# Patient Record
Sex: Male | Born: 1950 | Race: White | Hispanic: No | Marital: Single | Smoking: Former smoker
Health system: Southern US, Community
[De-identification: ages and names within clinical notes are randomized; demographics above are authoritative.]

## PROBLEM LIST (undated history)

## (undated) DIAGNOSIS — K429 Umbilical hernia without obstruction or gangrene: Secondary | ICD-10-CM

## (undated) DIAGNOSIS — K760 Fatty (change of) liver, not elsewhere classified: Secondary | ICD-10-CM

## (undated) DIAGNOSIS — G4730 Sleep apnea, unspecified: Secondary | ICD-10-CM

## (undated) DIAGNOSIS — E785 Hyperlipidemia, unspecified: Secondary | ICD-10-CM

## (undated) DIAGNOSIS — F4310 Post-traumatic stress disorder, unspecified: Secondary | ICD-10-CM

## (undated) DIAGNOSIS — K7010 Alcoholic hepatitis without ascites: Secondary | ICD-10-CM

## (undated) DIAGNOSIS — I10 Essential (primary) hypertension: Secondary | ICD-10-CM

## (undated) DIAGNOSIS — J189 Pneumonia, unspecified organism: Secondary | ICD-10-CM

## (undated) DIAGNOSIS — H269 Unspecified cataract: Secondary | ICD-10-CM

## (undated) DIAGNOSIS — K219 Gastro-esophageal reflux disease without esophagitis: Secondary | ICD-10-CM

## (undated) DIAGNOSIS — M1990 Unspecified osteoarthritis, unspecified site: Secondary | ICD-10-CM

## (undated) DIAGNOSIS — K635 Polyp of colon: Secondary | ICD-10-CM

## (undated) DIAGNOSIS — F1010 Alcohol abuse, uncomplicated: Secondary | ICD-10-CM

## (undated) DIAGNOSIS — J449 Chronic obstructive pulmonary disease, unspecified: Secondary | ICD-10-CM

## (undated) DIAGNOSIS — C4499 Other specified malignant neoplasm of skin, unspecified: Secondary | ICD-10-CM

## (undated) DIAGNOSIS — C4490 Unspecified malignant neoplasm of skin, unspecified: Secondary | ICD-10-CM

## (undated) DIAGNOSIS — E559 Vitamin D deficiency, unspecified: Secondary | ICD-10-CM

## (undated) HISTORY — DX: Pneumonia, unspecified organism: J18.9

## (undated) HISTORY — DX: Other specified malignant neoplasm of skin, unspecified: C44.99

## (undated) HISTORY — DX: Alcoholic hepatitis without ascites: K70.10

## (undated) HISTORY — DX: Unspecified malignant neoplasm of skin, unspecified: C44.90

## (undated) HISTORY — PX: HERNIA REPAIR: SHX51

## (undated) HISTORY — DX: Vitamin D deficiency, unspecified: E55.9

## (undated) HISTORY — DX: Umbilical hernia without obstruction or gangrene: K42.9

## (undated) HISTORY — PX: EYE SURGERY: SHX253

## (undated) HISTORY — DX: Fatty (change of) liver, not elsewhere classified: K76.0

## (undated) HISTORY — DX: Hyperlipidemia, unspecified: E78.5

## (undated) HISTORY — PX: BACK SURGERY: SHX140

## (undated) HISTORY — DX: Unspecified cataract: H26.9

## (undated) HISTORY — DX: Polyp of colon: K63.5

## (undated) HISTORY — DX: Alcohol abuse, uncomplicated: F10.10

## (undated) HISTORY — PX: WISDOM TOOTH EXTRACTION: SHX21

## (undated) HISTORY — DX: Post-traumatic stress disorder, unspecified: F43.10

---

## 2000-08-15 DIAGNOSIS — Z86010 Personal history of colonic polyps: Secondary | ICD-10-CM | POA: Insufficient documentation

## 2001-08-15 DIAGNOSIS — K219 Gastro-esophageal reflux disease without esophagitis: Secondary | ICD-10-CM | POA: Insufficient documentation

## 2007-11-28 ENCOUNTER — Encounter: Admission: RE | Admit: 2007-11-28 | Discharge: 2007-11-28 | Payer: Self-pay | Admitting: Internal Medicine

## 2015-06-29 ENCOUNTER — Encounter (HOSPITAL_COMMUNITY): Payer: Self-pay

## 2015-06-29 ENCOUNTER — Encounter (HOSPITAL_COMMUNITY)
Admission: RE | Admit: 2015-06-29 | Discharge: 2015-06-29 | Disposition: A | Discharge status: Home / Self Care | Payer: No Typology Code available for payment source | Source: Ambulatory Visit | Attending: Internal Medicine | Admitting: Internal Medicine

## 2015-06-29 VITALS — BP 124/64 | HR 84

## 2015-06-29 DIAGNOSIS — J441 Chronic obstructive pulmonary disease with (acute) exacerbation: Secondary | ICD-10-CM | POA: Insufficient documentation

## 2015-06-29 HISTORY — DX: Unspecified osteoarthritis, unspecified site: M19.90

## 2015-06-29 HISTORY — DX: Essential (primary) hypertension: I10
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
## 2015-06-30 ENCOUNTER — Encounter (HOSPITAL_COMMUNITY)
Admission: RE | Admit: 2015-06-30 | Discharge: 2015-06-30 | Disposition: A | Discharge status: Home / Self Care | Payer: No Typology Code available for payment source | Source: Ambulatory Visit | Attending: Internal Medicine | Admitting: Internal Medicine

## 2015-06-30 NOTE — Progress Notes (Signed)
Jakell completed a Six-Minute Walk Test on 06/30/2015 . Abid walked 1458 feet with 0 breaks.  The patient's lowest oxygen saturation was 90 %, highest heart rate was 112 bpm , and highest blood pressure was 146/84. The patient was on room air oxygen.  Patient stated that nothing hindered their walk test.

## 2015-07-02 ENCOUNTER — Encounter (HOSPITAL_COMMUNITY)
Admission: RE | Admit: 2015-07-02 | Discharge: 2015-07-02 | Disposition: A | Discharge status: Home / Self Care | Payer: No Typology Code available for payment source | Source: Ambulatory Visit | Attending: Internal Medicine | Admitting: Internal Medicine

## 2015-07-02 DIAGNOSIS — J441 Chronic obstructive pulmonary disease with (acute) exacerbation: Secondary | ICD-10-CM | POA: Diagnosis not present

## 2015-07-02 NOTE — Progress Notes (Signed)
Pt completed Quality of Life survey as a participant in Cardiac Rehab. Scores 21.0 or below are considered low. Pt score very low in several areas Overall 23.84, Health and Function 23.0, physiological and spiritual 24.0, family 23.00.

## 2015-07-02 NOTE — Progress Notes (Signed)
Today, Ronte exercised at [facility]. Service time was from 1:30pm to 3:45pm.  The patient exercised by performing aerobic, strengthening, and stretching exercises. Oxygen saturation, heart rate, blood pressure, rate of perceived exertion, and shortness of breath were all monitored before, during, and after exercise. Sean [name] presented with no problems at today's exercise session. The patient attended education today with [facility] Golden Gilreath on Exercise for the Pulmonary Patient.  The patient did not have an increase in workload intensity during today's exercise session.  Pre-exercise vitals: . Weight kg: 92.0 . Liters of O2: ra . SpO2: 93 . HR: 97 . BP: 124/80 . CBG: na  Exercise vitals: . Highest heartrate:  116 . Lowest oxygen saturation: 93 . Highest blood pressure: 160/84 . Liters of 02: ra  Post-exercise vitals: . SpO2: 96 . HR: 101 . BP: 136/70 . Liters of O2: ra . CBG: na  Dr. Brand Males, Medical Director Dr. Cruzita Lederer is immediately available during today's Pulmonary Rehab session for [name] on 07/02/15 at 1:30pm class time.

## 2015-07-07 ENCOUNTER — Encounter (HOSPITAL_COMMUNITY)
Admission: RE | Admit: 2015-07-07 | Discharge: 2015-07-07 | Disposition: A | Discharge status: Home / Self Care | Payer: No Typology Code available for payment source | Source: Ambulatory Visit | Attending: Internal Medicine | Admitting: Internal Medicine

## 2015-07-07 DIAGNOSIS — J441 Chronic obstructive pulmonary disease with (acute) exacerbation: Secondary | ICD-10-CM | POA: Diagnosis not present

## 2015-07-07 NOTE — Progress Notes (Signed)
Today, Sean [name] exercised at [facility]. Service time was from 1330 to 1535.  The patient exercised by performing aerobic, strengthening, and stretching exercises. Oxygen saturation, heart rate, blood pressure, rate of perceived exertion, and shortness of breath were all monitored before, during, and after exercise. Sean [name] presented with no problems at today's exercise session.He attended oxygen safety class today.  The patient did not have an increase in workload intensity during today's exercise session.  Pre-exercise vitals: . Weight kg: 92.2 . Liters of O2: RA . SpO2: 92 . HR: 72 . BP: 150/88 . CBG: NA  Exercise vitals: . Highest heartrate:  107 . Lowest oxygen saturation: 94 . Highest blood pressure: 160/80 . Liters of 02: RA  Post-exercise vitals: . SpO2: 94 . HR: 88 . BP: 130/86 . Liters of O2: RA . CBG: NA Dr. Brand Males, Medical Director Dr. Tana Coast is immediately available during today's Pulmonary Rehab session for Sean [name] on 07/07/2015  at 1330 class time.

## 2015-07-09 ENCOUNTER — Encounter (HOSPITAL_COMMUNITY): Payer: No Typology Code available for payment source

## 2015-07-14 ENCOUNTER — Encounter (HOSPITAL_COMMUNITY)
Admission: RE | Admit: 2015-07-14 | Discharge: 2015-07-14 | Disposition: A | Discharge status: Home / Self Care | Payer: No Typology Code available for payment source | Source: Ambulatory Visit | Attending: Internal Medicine | Admitting: Internal Medicine

## 2015-07-14 DIAGNOSIS — J441 Chronic obstructive pulmonary disease with (acute) exacerbation: Secondary | ICD-10-CM | POA: Diagnosis not present

## 2015-07-14 NOTE — Progress Notes (Signed)
Today, Fortunato exercised at [facility]. Service time was from 1330 to 1455.  The patient exercised by performing aerobic, strengthening, and stretching exercises. Oxygen saturation, heart rate, blood pressure, rate of perceived exertion, and shortness of breath were all monitored before, during, and after exercise. Tone presented with no problems at today's exercise session.  The patient did  have an increase in workload intensity during today's exercise session.  Pre-exercise vitals: . Weight kg: 91.3 . Liters of O2: RA . SpO2: 93 . HR: 86 . BP: 134/78 . CBG: NA  Exercise vitals: . Highest heartrate:  109 . Lowest oxygen saturation: 91 . Highest blood pressure: 156/102 Pt removed from bike and walked track, rechecked BP 166/92 . Liters of 02: RA  Post-exercise vitals: . SpO2: 92 . HR: 90 . BP: 142/68 . Liters of O2: RA . CBG: NA Dr. Brand Males, Medical Director Dr. Frederic Jericho is immediately available during today's Pulmonary Rehab session for Sean [name] on 07/14/2015  at 1330 class time.  Marland Kitchen

## 2015-07-16 ENCOUNTER — Encounter (HOSPITAL_COMMUNITY)
Admission: RE | Admit: 2015-07-16 | Discharge: 2015-07-16 | Disposition: A | Discharge status: Home / Self Care | Payer: Non-veteran care | Source: Ambulatory Visit | Attending: Internal Medicine | Admitting: Internal Medicine

## 2015-07-16 DIAGNOSIS — J441 Chronic obstructive pulmonary disease with (acute) exacerbation: Secondary | ICD-10-CM | POA: Diagnosis not present

## 2015-07-16 NOTE — Progress Notes (Signed)
Today, Sean [name] exercised at [facility]. Service time was from 1330 to 1530.  The patient exercised by performing aerobic, strengthening, and stretching exercises. Oxygen saturation, heart rate, blood pressure, rate of perceived exertion, and shortness of breath were all monitored before, during, and after exercise. Sean [name] presented with no problems at today's exercise session.Patient attended the Warning Signs and Symptoms class today.   The patient did not have an increase in workload intensity during today's exercise session.  Pre-exercise vitals: . Weight kg: 91.7 . Liters of O2: ra . SpO2: 92 . HR: 81 . BP: 158/92 . CBG: na  Exercise vitals: . Highest heartrate:  101 . Lowest oxygen saturation: 93 . Highest blood pressure: 150/80 . Liters of 02: ra  Post-exercise vitals: . SpO2: 94 . HR: 89 . BP: 124/72 . Liters of O2: ra . CBG: na Dr. Rush Farmer, Medical Director Dr. Eliseo Squires is immediately available during today's Pulmonary Rehab session for Sean [name] on 07/16/2015  at 1330 class time  .

## 2015-07-21 ENCOUNTER — Encounter (HOSPITAL_COMMUNITY)
Admission: RE | Admit: 2015-07-21 | Discharge: 2015-07-21 | Disposition: A | Discharge status: Home / Self Care | Payer: Non-veteran care | Source: Ambulatory Visit | Attending: Internal Medicine | Admitting: Internal Medicine

## 2015-07-21 DIAGNOSIS — J441 Chronic obstructive pulmonary disease with (acute) exacerbation: Secondary | ICD-10-CM | POA: Diagnosis not present

## 2015-07-21 NOTE — Progress Notes (Signed)
Today, [name] exercised at [facility]. Service time was from 1330 to 1500.  The patient exercised by performing aerobic, strengthening, and stretching exercises. Oxygen saturation, heart rate, blood pressure, rate of perceived exertion, and shortness of breath were all monitored before, during, and after exercise. Greison presented with no problems at today's exercise session.  The patient did not have an increase in workload intensity during today's exercise session.  Pre-exercise vitals: . Weight kg: 91.6 . Liters of O2: ra . SpO2: 96 . HR: 80 . BP: 154/90 . CBG: na  Exercise vitals: . Highest heartrate:  128 . Lowest oxygen saturation: 93 . Highest blood pressure: 148/90 . Liters of 02: ra  Post-exercise vitals: . SpO2: 96 . HR: 82 . BP: 126/80 . Liters of O2: ra . CBG: na  Dr. Rush Farmer, Medical Director Dr. Marily Memos is immediately available during today's Pulmonary Rehab session for Sean [name] on 07/21/2015 at 1330 class time

## 2015-07-23 ENCOUNTER — Encounter (HOSPITAL_COMMUNITY)
Admission: RE | Admit: 2015-07-23 | Discharge: 2015-07-23 | Disposition: A | Discharge status: Home / Self Care | Payer: Non-veteran care | Source: Ambulatory Visit | Attending: Internal Medicine | Admitting: Internal Medicine

## 2015-07-23 DIAGNOSIS — J441 Chronic obstructive pulmonary disease with (acute) exacerbation: Secondary | ICD-10-CM | POA: Diagnosis not present

## 2015-07-28 ENCOUNTER — Encounter (HOSPITAL_COMMUNITY)
Admission: RE | Admit: 2015-07-28 | Discharge: 2015-07-28 | Disposition: A | Discharge status: Home / Self Care | Payer: Non-veteran care | Source: Ambulatory Visit | Attending: Internal Medicine | Admitting: Internal Medicine

## 2015-07-28 DIAGNOSIS — J441 Chronic obstructive pulmonary disease with (acute) exacerbation: Secondary | ICD-10-CM | POA: Diagnosis not present

## 2015-07-28 NOTE — Progress Notes (Signed)
Today, Jawaan exercised at [facility]. Service time was from 1330 to 1500.  The patient exercised by performing aerobic, strengthening, and stretching exercises. Oxygen saturation, heart rate, blood pressure, rate of perceived exertion, and shortness of breath were all monitored before, during, and after exercise. Alian presented with no problems at today's exercise session.  The patient did have an increase in workload intensity during today's exercise session.  Pre-exercise vitals: . Weight kg: 92.4 . Liters of O2: ra . SpO2: 94 . HR: 66 . BP: 126/72 . CBG: na  Exercise vitals: . Highest heartrate:  101 . Lowest oxygen saturation: 92 . Highest blood pressure: 162/100 . Liters of 02: ra  Post-exercise vitals: . SpO2: 94 . HR: 83 . BP: 140/86 . Liters of O2: ra . CBG: na Dr. Rush Farmer, Medical Director Dr. Frederic Jericho is immediately available during today's Pulmonary Rehab session for [name] on 07/28/2015  at 1330 class time.  Marland Kitchen

## 2015-07-28 NOTE — Progress Notes (Signed)
I have reviewed a Home Exercise Prescription with Sean [name] . Sean [name] is currently exercising at home.  The patient was advised to walk 2-3 days a week for 30-45 minutes.  Sean [name] and I discussed how to progress their exercise prescription.  The patient stated that their goals were to improve breathing and lose weight.  The patient stated that they understand the exercise prescription.  We reviewed exercise guidelines, target heart rate during exercise, oxygen use, weather, home pulse oximeter, endpoints for exercise, and goals.  Patient is encouraged to come to me with any questions. I will continue to follow up with the patient to assist them with progression and safety.

## 2015-07-30 ENCOUNTER — Encounter (HOSPITAL_COMMUNITY)
Admission: RE | Admit: 2015-07-30 | Discharge: 2015-07-30 | Disposition: A | Discharge status: Home / Self Care | Payer: Non-veteran care | Source: Ambulatory Visit | Attending: Internal Medicine | Admitting: Internal Medicine

## 2015-07-30 DIAGNOSIS — J441 Chronic obstructive pulmonary disease with (acute) exacerbation: Secondary | ICD-10-CM | POA: Diagnosis not present

## 2015-07-30 NOTE — Progress Notes (Addendum)
Today, Sean [name] exercised at [facility]. Service time was from 1330 to 1530.  The patient exercised by performing aerobic, strengthening, and stretching exercises. Oxygen saturation, heart rate, blood pressure, rate of perceived exertion, and shortness of breath were all monitored before, during, and after exercise. Sean [name] presented with no problems at today's exercise session.  Patient attended the Advanced Directives class today.  We also discussed his alcohol usage.  He drinks 8-9 beers per day.  I encouraged cutting back to no more than 2 beers per day due to health issues.  Sean [name] was not receptive to this and does not want to cut back.  Drinking is a social outlet for him that he does with friends.    The patient did  have an increase in workload intensity during today's exercise session.  Pre-exercise vitals: . Weight kg: 91.9 . Liters of O2: ra . SpO2: 91 . HR: 93 . BP: 126/70 . CBG: na  Exercise vitals: . Highest heartrate:  91 . Lowest oxygen saturation: 93 . Highest blood pressure: 142/80 . Liters of 02: ra  Post-exercise vitals: . SpO2: 93 . HR: 68 . BP: 118/74 . Liters of O2: ra . CBG: na Dr. Rush Farmer, Medical Director Dr. Frederic Jericho is immediately available during today's Pulmonary Rehab session for Sean [name] on 07/30/2015  at 1330 class time.  Marland Kitchen

## 2015-08-04 ENCOUNTER — Encounter (HOSPITAL_COMMUNITY)
Admission: RE | Admit: 2015-08-04 | Discharge: 2015-08-04 | Disposition: A | Discharge status: Home / Self Care | Payer: Non-veteran care | Source: Ambulatory Visit | Attending: Internal Medicine | Admitting: Internal Medicine

## 2015-08-04 DIAGNOSIS — J441 Chronic obstructive pulmonary disease with (acute) exacerbation: Secondary | ICD-10-CM | POA: Diagnosis not present

## 2015-08-04 NOTE — Progress Notes (Signed)
Today, [name] exercised at [facility]. Service time was from 1330 to 1505.  The patient exercised by performing aerobic, strengthening, and stretching exercises. Oxygen saturation, heart rate, blood pressure, rate of perceived exertion, and shortness of breath were all monitored before, during, and after exercise. [name] presented with no problems at today's exercise session.  The patient did not have an increase in workload intensity during today's exercise session.  Pre-exercise vitals: . Weight kg: 92.4 . Liters of O2: RA . SpO2: 91 . HR: 68 . BP: 116/60 . CBG: NA  Exercise vitals: . Highest heartrate:  95 . Lowest oxygen saturation: 92 . Highest blood pressure: 144/80 . Liters of 02: RA  Post-exercise vitals: . SpO2: 93 . HR: 71 . BP: 122/76 . Liters of O2: RA . CBG: NA Dr. Rush Farmer, Medical Director Dr. Marily Memos is immediately available during today's Pulmonary Rehab session for Sean [name] on 08/04/2015  at 1330 class time  .

## 2015-08-06 ENCOUNTER — Encounter (HOSPITAL_COMMUNITY)
Admission: RE | Admit: 2015-08-06 | Discharge: 2015-08-06 | Disposition: A | Discharge status: Home / Self Care | Payer: Non-veteran care | Source: Ambulatory Visit | Attending: Internal Medicine | Admitting: Internal Medicine

## 2015-08-06 DIAGNOSIS — J441 Chronic obstructive pulmonary disease with (acute) exacerbation: Secondary | ICD-10-CM | POA: Diagnosis not present

## 2015-08-06 NOTE — Progress Notes (Signed)
Today, Sean [name] exercised at [facility]. Service time was from 1:30pm to 3:30pm.  The patient exercised by performing aerobic, strengthening, and stretching exercises. Oxygen saturation, heart rate, blood pressure, rate of perceived exertion, and shortness of breath were all monitored before, during, and after exercise. Sean [name] presented with no problems at today's exercise session. The patient attended education today with [facility] Effie Janoski on Pursed Lip and Diaphragmatic Breathing.  The patient did not have an increase in workload intensity during today's exercise session.  Pre-exercise vitals: . Weight kg: 92.7 . Liters of O2: ra . SpO2: 93 . HR: 87 . BP: 156/94 . CBG: na  Exercise vitals: . Highest heartrate:  109 . Lowest oxygen saturation: 93 . Highest blood pressure: 164/94 . Liters of 02: ra  Post-exercise vitals: . SpO2: 92 . HR: 90 . BP: 144/88 . Liters of O2: ra . CBG: na  Dr. Rush Farmer, Medical Director Dr. Clementeen Graham is immediately available during today's Pulmonary Rehab session for Sean [name] on 08/06/15 at 1:30pm class time.

## 2015-08-11 ENCOUNTER — Encounter (HOSPITAL_COMMUNITY)
Admission: RE | Admit: 2015-08-11 | Discharge: 2015-08-11 | Disposition: A | Discharge status: Home / Self Care | Payer: Non-veteran care | Source: Ambulatory Visit | Attending: Internal Medicine | Admitting: Internal Medicine

## 2015-08-11 DIAGNOSIS — J441 Chronic obstructive pulmonary disease with (acute) exacerbation: Secondary | ICD-10-CM | POA: Diagnosis not present

## 2015-08-11 NOTE — Progress Notes (Signed)
Today, [name] exercised at [facility]. Service time was from 1:30pm to 3:00pm.  The patient exercised by performing aerobic, strengthening, and stretching exercises. Oxygen saturation, heart rate, blood pressure, rate of perceived exertion, and shortness of breath were all monitored before, during, and after exercise. Sean [name] presented with no problems at today's exercise session.  The patient did not have an increase in workload intensity during today's exercise session.  Pre-exercise vitals: . Weight kg: 91.9 . Liters of O2: ra . SpO2: 93 . HR: 93 . BP: 150/104 . CBG: na  Exercise vitals: . Highest heartrate:  105 . Lowest oxygen saturation: 91 . Highest blood pressure: 150/80 . Liters of 02: ra  Post-exercise vitals: . SpO2: 92 . HR: 88 . BP: 134/80 . Liters of O2: ra . CBG: na  Dr. Rush Farmer, Medical Director Dr. Marily Memos is immediately available during today's Pulmonary Rehab session for Sean [name] on 08/11/15 at 1:30pm class time

## 2015-08-13 ENCOUNTER — Encounter (HOSPITAL_COMMUNITY)
Admission: RE | Admit: 2015-08-13 | Discharge: 2015-08-13 | Disposition: A | Discharge status: Home / Self Care | Payer: Non-veteran care | Source: Ambulatory Visit | Attending: Internal Medicine | Admitting: Internal Medicine

## 2015-08-13 DIAGNOSIS — J441 Chronic obstructive pulmonary disease with (acute) exacerbation: Secondary | ICD-10-CM | POA: Diagnosis not present

## 2015-08-13 NOTE — Progress Notes (Signed)
Today, Kristine exercised at [facility]. Service time was from 1330 to 1540.  The patient exercised by performing aerobic, strengthening, and stretching exercises. Oxygen saturation, heart rate, blood pressure, rate of perceived exertion, and shortness of breath were all monitored before, during, and after exercise. Munachimso presented with no problems at today's exercise session. He attended nutritional class today.  The patient did not have an increase in workload intensity during today's exercise session.  Pre-exercise vitals: . Weight kg: 92.8 . Liters of O2: RA . SpO2: 94 . HR: 80 . BP: 140/86 . CBG: NA  Exercise vitals: . Highest heartrate:  99 . Lowest oxygen saturation: 94 . Highest blood pressure: 140/84 . Liters of 02: RA  Post-exercise vitals: . SpO2: 94 . HR: 85 . BP: 128/80 . Liters of O2: RA . CBG: NA Dr. Rush Farmer, Medical Director Dr. Darrick Meigs is immediately available during today's Pulmonary Rehab session for [name] on 08/13/2015  at 1330 class time  .

## 2015-08-16 DIAGNOSIS — K625 Hemorrhage of anus and rectum: Secondary | ICD-10-CM | POA: Insufficient documentation

## 2015-08-16 DIAGNOSIS — R110 Nausea: Secondary | ICD-10-CM | POA: Insufficient documentation

## 2015-08-18 ENCOUNTER — Encounter (HOSPITAL_COMMUNITY)
Admission: RE | Admit: 2015-08-18 | Discharge: 2015-08-18 | Disposition: A | Discharge status: Home / Self Care | Payer: Non-veteran care | Source: Ambulatory Visit | Attending: Internal Medicine | Admitting: Internal Medicine

## 2015-08-18 DIAGNOSIS — J441 Chronic obstructive pulmonary disease with (acute) exacerbation: Secondary | ICD-10-CM | POA: Diagnosis present

## 2015-08-18 NOTE — Progress Notes (Addendum)
Sean [name] 65 y.o. male Nutrition Note Spoke with pt. Pt is obese. There are many ways the pt can make his eating habits healthier.  Pt's Rate Your Plate results reviewed with pt. Pt does not avoid salty food; uses canned/ convenience food.  Pt adds salt to food.  The role of sodium in lung disease reviewed with pt. Pt c/o 20 lb wt gain over the past year. Pt interested in losing wt. Wt loss tips discussed. Pt reports consuming 6 alcoholic beverages daily. MVI use noted on nutrition screen. Pt expressed understanding of the information reviewed. No results found for: HGBA1C  Nutrition Diagnosis ? Excessive sodium intake related to over consumption of processed food as evidenced by frequent consumption of convenience food/ canned vegetables and eating out frequently. ? Food-and nutrition-related knowledge deficit related to lack of exposure to information as related to diagnosis of pulmonary disease ? Obesity related to excessive energy intake as evidenced by a BMI of 32.1 ?  Nutrition Rx/Est. Daily Nutrition Needs for: ? wt loss 1500-2000 Kcal  75-90 gm protein   1500 mg or less sodium Nutrition Intervention ? Pt's individual nutrition plan and goals reviewed with pt. ? Benefits of adopting healthy eating habits discussed when pt's Rate Your Plate reviewed. ? Handouts given for 1500 kcal, 5-day menu ideas, list of meal delivery plan websites ? Pt to attend the Nutrition and Lung Disease class ? Continual client-centered nutrition education by RD, as part of interdisciplinary care. Goal(s) 1. Pt to identify and limit food sources of sodium. 2. Identify food quantities necessary to achieve wt loss of  -2# per week to a goal wt of 79.4-87.6 kg (174-192 lb) at graduation from pulmonary rehab. 3. Describe the benefit of including fruits, vegetables, whole grains, and low-fat dairy products in a healthy meal plan. Monitor and Evaluate progress toward nutrition goal with team.   Derek Mound, M.Ed,  RD, LDN, CDE 08/18/2015 3:27 PM

## 2015-08-18 NOTE — Progress Notes (Signed)
Today, [name] exercised at [facility]. Service time was from 1330 to 1455.  The patient exercised by performing aerobic, strengthening, and stretching exercises. Oxygen saturation, heart rate, blood pressure, rate of perceived exertion, and shortness of breath were all monitored before, during, and after exercise. [name] presented with no problems at today's exercise session.  The patient did not have an increase in workload intensity during today's exercise session.  Pre-exercise vitals: . Weight kg: 92.2 . Liters of O2: RA . SpO2: 92 . HR: 85 . BP: 132/66 . CBG: NA  Exercise vitals: . Highest heartrate:  104 . Lowest oxygen saturation: 92 . Highest blood pressure: 146/80 . Liters of 02: RA  Post-exercise vitals: . SpO2: 92 . HR: 83 . BP: 108/60 . Liters of O2: RA . CBG: NA Dr. Rush Farmer, Medical Director Dr. Marily Memos is immediately available during today's Pulmonary Rehab session for Sean [name] on 08/18/2015  at 1330 class time  .

## 2015-08-20 ENCOUNTER — Encounter (HOSPITAL_COMMUNITY)
Admission: RE | Admit: 2015-08-20 | Discharge: 2015-08-20 | Disposition: A | Discharge status: Home / Self Care | Payer: Non-veteran care | Source: Ambulatory Visit | Attending: Internal Medicine | Admitting: Internal Medicine

## 2015-08-20 DIAGNOSIS — J441 Chronic obstructive pulmonary disease with (acute) exacerbation: Secondary | ICD-10-CM | POA: Diagnosis not present

## 2015-08-20 NOTE — Progress Notes (Signed)
Today, Evelio exercised at [facility]. Service time was from 1330 to 1515.  The patient exercised by performing aerobic, strengthening, and stretching exercises. Oxygen saturation, heart rate, blood pressure, rate of perceived exertion, and shortness of breath were all monitored before, during, and after exercise. Timari presented with no problems at today's exercise session. He attended risf factor reduction class today.  The patient did  have an increase in workload intensity during today's exercise session.  Pre-exercise vitals: . Weight kg: 91.4 . Liters of O2: RA . SpO2: 92 . HR: 73 . BP: 120/64 . CBG: NA  Exercise vitals: . Highest heartrate:  98 . Lowest oxygen saturation: 93 . Highest blood pressure: 144/82 . Liters of 02: RA  Post-exercise vitals: . SpO2: 92 . HR: 71 . BP: 122/80 . Liters of O2: RA . CBG: NA Dr. Rush Farmer, Medical Director Dr. Marily Memos is immediately available during today's Pulmonary Rehab session for Sean [name] on 08/20/2015  at 1330 class time  .

## 2015-08-25 ENCOUNTER — Encounter (HOSPITAL_COMMUNITY)
Admission: RE | Admit: 2015-08-25 | Discharge: 2015-08-25 | Disposition: A | Discharge status: Home / Self Care | Payer: Non-veteran care | Source: Ambulatory Visit | Attending: Internal Medicine | Admitting: Internal Medicine

## 2015-08-25 DIAGNOSIS — J441 Chronic obstructive pulmonary disease with (acute) exacerbation: Secondary | ICD-10-CM | POA: Diagnosis not present

## 2015-08-25 NOTE — Progress Notes (Addendum)
Today, [name] exercised at [facility]. Service time was from 1330 to 1515.  The patient exercised by performing aerobic, strengthening, and stretching exercises. Oxygen saturation, heart rate, blood pressure, rate of perceived exertion, and shortness of breath were all monitored before, during, and after exercise. Sean [name] presented with left hip and knee pain while walking on the treadmill at today's exercise session. He instead walked on the track and had much less knee and hip pain.    The patient did not have an increase in workload intensity during today's exercise session.  Pre-exercise vitals: . Weight kg: 92.2 . Liters of O2: ra . SpO2: 95 . HR: 70 . BP: 124/72 . CBG: na  Exercise vitals: . Highest heartrate:  115 . Lowest oxygen saturation: 93 . Highest blood pressure: 122/70 . Liters of 02: ra  Post-exercise vitals: . SpO2: 93 . HR: 68 . BP: 136/80 . Liters of O2: ra . CBG: na Dr. Rush Farmer, Medical Director Dr. Marily Memos is immediately available during today's Pulmonary Rehab session for Sean [name] on 08/25/2015  at 1330 class time

## 2015-08-27 ENCOUNTER — Encounter (HOSPITAL_COMMUNITY)
Admission: RE | Admit: 2015-08-27 | Discharge: 2015-08-27 | Disposition: A | Discharge status: Home / Self Care | Payer: Non-veteran care | Source: Ambulatory Visit | Attending: Internal Medicine | Admitting: Internal Medicine

## 2015-08-27 DIAGNOSIS — J441 Chronic obstructive pulmonary disease with (acute) exacerbation: Secondary | ICD-10-CM | POA: Diagnosis not present

## 2015-08-27 NOTE — Progress Notes (Signed)
Today, Sean [name] exercised at [facility]. Service time was from 1330 to 1500.  The patient exercised by performing aerobic, strengthening, and stretching exercises. Oxygen saturation, heart rate, blood pressure, rate of perceived exertion, and shortness of breath were all monitored before, during, and after exercise. Sean [name] presented with no problems at today's exercise session. He attended stress management class today.  The patient did not have an increase in workload intensity during today's exercise session.  Pre-exercise vitals: . Weight kg: 91.8 . Liters of O2: RA . SpO2: 94 . HR: 69 . BP: 140/72 . CBG: NA   Exercise vitals: . Highest heartrate:  95 . Lowest oxygen saturation: 93 . Highest blood pressure: 142/80 . Liters of 02: RA  Post-exercise vitals: . SpO2: 94 . HR: 80 . BP: 138/80 . Liters of O2: RA . CBG: NA Dr. Rush Farmer, Medical Director Dr. Waldron Labs is immediately available during today's Pulmonary Rehab session for Sean [name] on 08/27/2015  at 1330 class time.  Marland Kitchen

## 2015-09-01 ENCOUNTER — Encounter (HOSPITAL_COMMUNITY)
Admission: RE | Admit: 2015-09-01 | Discharge: 2015-09-01 | Disposition: A | Discharge status: Home / Self Care | Payer: Non-veteran care | Source: Ambulatory Visit | Attending: Internal Medicine | Admitting: Internal Medicine

## 2015-09-01 DIAGNOSIS — J441 Chronic obstructive pulmonary disease with (acute) exacerbation: Secondary | ICD-10-CM | POA: Diagnosis not present

## 2015-09-01 NOTE — Progress Notes (Signed)
Today, Sean [name] exercised at [facility]. Service time was from 1330 to 1500.  The patient exercised by performing aerobic, strengthening, and stretching exercises. Oxygen saturation, heart rate, blood pressure, rate of perceived exertion, and shortness of breath were all monitored before, during, and after exercise. Sean [name] presented with no problems at today's exercise session.  The patient did  have an decrease in workload intensity during today's exercise session due to knee and hip pain.  Pre-exercise vitals: . Weight kg: 92.8 . Liters of O2: RA . SpO2: 94 . HR: 70 . BP: 140/78 . CBG: NA  Exercise vitals: . Highest heartrate:  95 . Lowest oxygen saturation: 93 . Highest blood pressure: 140/100 . Liters of 02: RA  Post-exercise vitals: . SpO2: 95 . HR: 83 . BP: 114/70 . Liters of O2: RA . CBG: NA Dr. Rush Farmer, Medical Director Dr. Marily Memos is immediately available during today's Pulmonary Rehab session for Sean [name] on 09/01/2015  at 1330 class time  .

## 2015-09-03 ENCOUNTER — Encounter (HOSPITAL_COMMUNITY)
Admission: RE | Admit: 2015-09-03 | Discharge: 2015-09-03 | Disposition: A | Discharge status: Home / Self Care | Payer: Non-veteran care | Source: Ambulatory Visit | Attending: Internal Medicine | Admitting: Internal Medicine

## 2015-09-03 DIAGNOSIS — J441 Chronic obstructive pulmonary disease with (acute) exacerbation: Secondary | ICD-10-CM | POA: Diagnosis not present

## 2015-09-03 NOTE — Progress Notes (Signed)
Today, Sean [name] exercised at [facility] [facility]. Service time was from 1330 to 1525.  The patient exercised by performing aerobic, strengthening, and stretching exercises. Oxygen saturation, heart rate, blood pressure, rate of perceived exertion, and shortness of breath were all monitored before, during, and after exercise. Sean [name] presented with no problems at today's exercise session. He attended pulmonary medication class today.  The patient did not have an increase in workload intensity during today's exercise session.  Pre-exercise vitals: . Weight kg: 92.3 . Liters of O2: RA . SpO2: 95 . HR: 71 . BP: 144/82 . CBG: NA  Exercise vitals: . Highest heartrate:  97 . Lowest oxygen saturation: 94 . Highest blood pressure: 140/70 . Liters of 02: RA  Post-exercise vitals: . SpO2: 94 . HR: 85 . BP: 148/80 . Liters of O2: RA . CBG: NA Dr. Rush Farmer, Medical Director Dr. Marily Memos is immediately available during today's Pulmonary Rehab session for Sean [name] on 09/03/2015  at 1330 class time  .

## 2015-09-08 ENCOUNTER — Encounter (HOSPITAL_COMMUNITY)
Admission: RE | Admit: 2015-09-08 | Discharge: 2015-09-08 | Disposition: A | Discharge status: Home / Self Care | Payer: Non-veteran care | Source: Ambulatory Visit | Attending: Internal Medicine | Admitting: Internal Medicine

## 2015-09-08 DIAGNOSIS — J441 Chronic obstructive pulmonary disease with (acute) exacerbation: Secondary | ICD-10-CM | POA: Diagnosis not present

## 2015-09-08 NOTE — Progress Notes (Signed)
Today, Sean [name] exercised at [facility]. Service time was from 1330 to 1500.  The patient exercised by performing aerobic, strengthening, and stretching exercises. Oxygen saturation, heart rate, blood pressure, rate of perceived exertion, and shortness of breath were all monitored before, during, and after exercise. Sean [name] presented with no problems at today's exercise session.  The patient did not have an increase in workload intensity during today's exercise session.  Pre-exercise vitals: . Weight kg: 92.7 . Liters of O2: RA . SpO2: 93 . HR: 86 . BP: 132/70 . CBG: NA  Exercise vitals: . Highest heartrate:  100 . Lowest oxygen saturation: 92 . Highest blood pressure: 138/86 . Liters of 02: RA  Post-exercise vitals: . SpO2: 95 . HR: 87 . BP: 152/82 recheck 128/72 . Liters of O2: RA . CBG: NA Dr. Rush Farmer, Medical Director Dr. Marily Memos is immediately available during today's Pulmonary Rehab session for Sean [name] on 09/08/2015  at 1330 class time  .

## 2015-09-10 ENCOUNTER — Encounter (HOSPITAL_COMMUNITY): Payer: Non-veteran care

## 2015-09-15 ENCOUNTER — Encounter (HOSPITAL_COMMUNITY)
Admission: RE | Admit: 2015-09-15 | Discharge: 2015-09-15 | Disposition: A | Discharge status: Home / Self Care | Payer: Non-veteran care | Source: Ambulatory Visit | Attending: Internal Medicine | Admitting: Internal Medicine

## 2015-09-15 DIAGNOSIS — J441 Chronic obstructive pulmonary disease with (acute) exacerbation: Secondary | ICD-10-CM | POA: Diagnosis not present

## 2015-09-15 NOTE — Progress Notes (Signed)
Today, [name] exercised at [facility]. Service time was from 1330 to 1500.  The patient exercised by performing aerobic, strengthening, and stretching exercises. Oxygen saturation, heart rate, blood pressure, rate of perceived exertion, and shortness of breath were all monitored before, during, and after exercise. Sean [name] presented with no problems at today's exercise session.  The patient did not have an increase in workload intensity during today's exercise session.  Pre-exercise vitals: . Weight kg: 92.3 . Liters of O2: RA . SpO2: 95 . HR: 81 . BP: 126/64 . CBG: NA  Exercise vitals: . Highest heartrate:  113 . Lowest oxygen saturation: 93 . Highest blood pressure: 124/64 . Liters of 02: RA  Post-exercise vitals: . SpO2:  . 91 . HR: 83 . BP: 126/60 . Liters of O2: RA . CBG: NA Dr. Rush Farmer, Medical Director Dr. Marily Memos is immediately available during today's Pulmonary Rehab session for Sean [name] on 09/15/2015  at 1330 class time

## 2015-09-17 ENCOUNTER — Encounter (HOSPITAL_COMMUNITY)
Admission: RE | Admit: 2015-09-17 | Discharge: 2015-09-17 | Disposition: A | Discharge status: Home / Self Care | Payer: Non-veteran care | Source: Ambulatory Visit | Attending: Internal Medicine | Admitting: Internal Medicine

## 2015-09-17 DIAGNOSIS — J441 Chronic obstructive pulmonary disease with (acute) exacerbation: Secondary | ICD-10-CM | POA: Diagnosis present

## 2015-09-17 NOTE — Progress Notes (Signed)
Today, Sean [name] exercised at [facility]. Service time was from 1330 to 1515.  The patient exercised by performing aerobic, strengthening, and stretching exercises. Oxygen saturation, heart rate, blood pressure, rate of perceived exertion, and shortness of breath were all monitored before, during, and after exercise. Sean [name] presented with no problems at today's exercise session. Sean [name] also attended an educations session on pulmonary medications and PLB with a respiratory therapist.  The patient did not have an increase in workload intensity during today's exercise session.  Pre-exercise vitals: . Weight kg: 92.7 . Liters of O2: ra . SpO2: 93 . HR: 65 . BP: 130/68 . CBG: na  Exercise vitals: . Highest heartrate:  98 . Lowest oxygen saturation: 95 . Highest blood pressure: 142/80 . Liters of 02: ra  Post-exercise vitals: . SpO2: 95 . HR: 90 . BP: 128/90, 140/84 . Liters of O2: ra . CBG: na  Dr. Rush Farmer, Medical Director Dr. Jerilee Hoh is immediately available during today's Pulmonary Rehab session for Sean [name] on 09/17/2015 at 1330 class time.

## 2015-09-22 ENCOUNTER — Encounter (HOSPITAL_COMMUNITY)
Admission: RE | Admit: 2015-09-22 | Discharge: 2015-09-22 | Disposition: A | Discharge status: Home / Self Care | Payer: Non-veteran care | Source: Ambulatory Visit | Attending: Internal Medicine | Admitting: Internal Medicine

## 2015-09-22 DIAGNOSIS — J441 Chronic obstructive pulmonary disease with (acute) exacerbation: Secondary | ICD-10-CM | POA: Diagnosis not present

## 2015-09-22 NOTE — Progress Notes (Signed)
Today, Cesario exercised at [facility]. Service time was from 1330 to 1510.  The patient exercised by performing aerobic, strengthening, and stretching exercises. Oxygen saturation, heart rate, blood pressure, rate of perceived exertion, and shortness of breath were all monitored before, during, and after exercise. Jeremiyah presented with no problems at today's exercise session.  The patient did not have an increase in workload intensity during today's exercise session.  Pre-exercise vitals: . Weight kg: 93.2 . Liters of O2: RA . SpO2: 94 . HR: 80 . BP: 140/72 . CBG: NA  Exercise vitals: . Highest heartrate:  100 . Lowest oxygen saturation: 92 . Highest blood pressure: 152/88 . Liters of 02: RA  Post-exercise vitals: . SpO2: 95 . HR: 71 . BP: 122/64 . Liters of O2: RA . CBG: NA Dr. Rush Farmer, Medical Director Dr. Marily Memos is immediately available during today's Pulmonary Rehab session for [name] on 09/22/2015  at 1330 class time  .

## 2015-09-24 ENCOUNTER — Encounter (HOSPITAL_COMMUNITY)
Admission: RE | Admit: 2015-09-24 | Discharge: 2015-09-24 | Disposition: A | Discharge status: Home / Self Care | Payer: Non-veteran care | Source: Ambulatory Visit | Attending: Internal Medicine | Admitting: Internal Medicine

## 2015-09-24 DIAGNOSIS — J441 Chronic obstructive pulmonary disease with (acute) exacerbation: Secondary | ICD-10-CM | POA: Diagnosis not present

## 2015-09-24 NOTE — Progress Notes (Signed)
Today, Sean [name] exercised at [facility]. Service time was from 1330 to 1520.  The patient exercised by performing aerobic, strengthening, and stretching exercises. Oxygen saturation, heart rate, blood pressure, rate of perceived exertion, and shortness of breath were all monitored before, during, and after exercise. Sean [name] presented with no problems at today's exercise session. He attended [place] education class today.  The patient did not have an increase in workload intensity during today's exercise session.  Pre-exercise vitals: . Weight kg: 92.6 . Liters of O2: RA . SpO2: 93 . HR: 82 . BP: 140/84 . CBG: NA  Exercise vitals: . Highest heartrate:  98 . Lowest oxygen saturation: 94 . Highest blood pressure: 160/90 . Liters of 02: RA  Post-exercise vitals: . SpO2: 95 . HR: 91 . BP: 118/72 . Liters of O2: RA . CBG: NA Dr. Rush Farmer, Medical Director Dr. Waldron Labs is immediately available during today's Pulmonary Rehab session for Sean [name] on 09/24/2015  at 1330 class time.  Marland Kitchen

## 2015-09-29 ENCOUNTER — Encounter (HOSPITAL_COMMUNITY)
Admission: RE | Admit: 2015-09-29 | Discharge: 2015-09-29 | Disposition: A | Discharge status: Home / Self Care | Payer: Non-veteran care | Source: Ambulatory Visit | Attending: Internal Medicine | Admitting: Internal Medicine

## 2015-09-29 DIAGNOSIS — J441 Chronic obstructive pulmonary disease with (acute) exacerbation: Secondary | ICD-10-CM | POA: Diagnosis not present

## 2015-09-29 NOTE — Progress Notes (Signed)
Today, Sean [name] exercised at [facility]. Service time was from 1330 to 1510.  The patient exercised by performing aerobic, strengthening, and stretching exercises. Oxygen saturation, heart rate, blood pressure, rate of perceived exertion, and shortness of breath were all monitored before, during, and after exercise. Sean [name] presented with no problems at today's exercise session.  The patient did  have an increase in workload intensity during today's exercise session.  Pre-exercise vitals: . Weight kg: 92.7 . Liters of O2: ra . SpO2: 92 . HR: 82 . BP: 138/92 . CBG: na  Exercise vitals: . Highest heartrate:  111 . Lowest oxygen saturation: 90 . Highest blood pressure: 120/70 . Liters of 02: ra  Post-exercise vitals: . SpO2: 93 . HR: 90 . BP: 114/66 . Liters of O2: ra . CBG: na Dr. Rush Farmer, Medical Director Dr. Marily [name] is immediately available during today's Pulmonary Rehab session for Sean [name] on 09/29/2015  at 1330 class time  .

## 2015-10-01 ENCOUNTER — Encounter (HOSPITAL_COMMUNITY)
Admission: RE | Admit: 2015-10-01 | Discharge: 2015-10-01 | Disposition: A | Discharge status: Home / Self Care | Payer: Non-veteran care | Source: Ambulatory Visit | Attending: Internal Medicine | Admitting: Internal Medicine

## 2015-10-01 DIAGNOSIS — J441 Chronic obstructive pulmonary disease with (acute) exacerbation: Secondary | ICD-10-CM | POA: Diagnosis not present

## 2015-10-01 NOTE — Progress Notes (Signed)
Today, [name] exercised at [facility]. Service time was from 1330 to 1515.  The patient exercised by performing aerobic, strengthening, and stretching exercises. Oxygen saturation, heart rate, blood pressure, rate of perceived exertion, and shortness of breath were all monitored before, during, and after exercise. Sean [name] presented with no problems at today's exercise session. He attended warning signs and symptoms class today.  The patient did not have an increase in workload intensity during today's exercise session.  Pre-exercise vitals: . Weight kg: 92.2 . Liters of O2: RA . SpO2: 95 . HR: 81 . BP: 140/80 . CBG: NA  Exercise vitals: . Highest heartrate:  105 . Lowest oxygen saturation: 93 . Highest blood pressure: 160/84 . Liters of 02: RA  Post-exercise vitals: . SpO2: 95 . HR: 96 . BP: 140/80 . Liters of O2: RA . CBG: NA Dr. Rush Farmer, Medical Director Dr. Eliseo Squires is immediately available during today's Pulmonary Rehab session for Sean [name] on 10/01/2015  at 1330 class time  .

## 2015-10-06 ENCOUNTER — Encounter (HOSPITAL_COMMUNITY)
Admission: RE | Admit: 2015-10-06 | Discharge: 2015-10-06 | Disposition: A | Discharge status: Home / Self Care | Payer: Non-veteran care | Source: Ambulatory Visit | Attending: Internal Medicine | Admitting: Internal Medicine

## 2015-10-06 DIAGNOSIS — J441 Chronic obstructive pulmonary disease with (acute) exacerbation: Secondary | ICD-10-CM | POA: Diagnosis not present

## 2015-10-06 NOTE — Progress Notes (Signed)
Today, Axyl exercised at [facility]. Service time was from 1330 to 1500.  The patient exercised by performing aerobic, strengthening, and stretching exercises. Oxygen saturation, heart rate, blood pressure, rate of perceived exertion, and shortness of breath were all monitored before, during, and after exercise. Sean [name] presented with no problems at today's exercise session.  The patient did not have an increase in workload intensity during today's exercise session.  Pre-exercise vitals: . Weight kg: 92.9 . Liters of O2: RA . SpO2: 95 . HR: 82 . BP: 126/70 . CBG: NA  Exercise vitals: . Highest heartrate:  95 . Lowest oxygen saturation: 91 . Highest blood pressure: 132/78 . Liters of 02: RA  Post-exercise vitals: . SpO2: 94 . HR: 72 . BP: 126/80 . Liters of O2: RA . CBG: NA Dr. Rush Farmer, Medical Director Dr. Marily Memos is immediately available during today's Pulmonary Rehab session for Sean [name] on 10/06/2015  at 1330 class time  .

## 2015-10-08 ENCOUNTER — Encounter (HOSPITAL_COMMUNITY)
Admission: RE | Admit: 2015-10-08 | Discharge: 2015-10-08 | Disposition: A | Discharge status: Home / Self Care | Payer: Non-veteran care | Source: Ambulatory Visit | Attending: Internal Medicine | Admitting: Internal Medicine

## 2015-10-08 DIAGNOSIS — J441 Chronic obstructive pulmonary disease with (acute) exacerbation: Secondary | ICD-10-CM | POA: Diagnosis not present

## 2015-10-08 NOTE — Progress Notes (Signed)
Today, Gerrald exercised at [facility]. Service time was from 1330 to 1525.  The patient exercised by performing aerobic, strengthening, and stretching exercises. Oxygen saturation, heart rate, blood pressure, rate of perceived exertion, and shortness of breath were all monitored before, during, and after exercise. Ayrton presented with no problems at today's exercise session. Callahan also attended an education session on exercise for the pulmonary patient.  The patient did not have an increase in workload intensity during today's exercise session.  Pre-exercise vitals: . Weight kg: 93.3 . Liters of O2: ra . SpO2: 93 . HR: 65 . BP: 130/80 . CBG: na  Exercise vitals: . Highest heartrate:  92 . Lowest oxygen saturation: 94 . Highest blood pressure: 120/72 . Liters of 02: ra  Post-exercise vitals: . SpO2: 94 . HR: 69 . BP: 110/70 . Liters of O2: ra . CBG: na  Dr. Rush Farmer, Medical Director Dr. Waldron Labs is immediately available during today's Pulmonary Rehab session for [name] on 10/08/2015 at 1330 class time.

## 2015-10-13 ENCOUNTER — Encounter (HOSPITAL_COMMUNITY)
Admission: RE | Admit: 2015-10-13 | Discharge: 2015-10-13 | Disposition: A | Discharge status: Home / Self Care | Payer: Non-veteran care | Source: Ambulatory Visit | Attending: Internal Medicine | Admitting: Internal Medicine

## 2015-10-13 DIAGNOSIS — J441 Chronic obstructive pulmonary disease with (acute) exacerbation: Secondary | ICD-10-CM | POA: Diagnosis not present

## 2015-10-13 NOTE — Progress Notes (Addendum)
Daily Session Note  Patient Details  Name: Jordany Russett MRN: [id number] Date of Birth: 10/16/1950 Referring Provider:  No ref. provider found  Encounter Date: 10/13/2015  Check In:     Session Check In - 10/13/15 1333    Check-In   Staff Present Rosebud Poles, RN, Deland Pretty, MS, ACSM CEP, Exercise Physiologist;Annedrea Rosezella [place], RN, MHA;Daesia Zylka Rollene Rotunda, RN, BSN   Supervising physician immediately available to respond to emergencies [facility] immediately available   Physician(s) Dr. Marily Memos   Medication changes reported     No   Fall or balance concerns reported    No   Warm-up and Cool-down Performed as group-led instruction   Resistance Training Performed No      Capillary Blood Glucose: No results found for this or any previous visit (from the past 24 hour(s)).      Exercise Prescription Changes - 10/13/15 1600    Exercise Review   Progression No   Response to Exercise   Blood Pressure (Admit) 134/82 mmHg   Blood Pressure (Exercise) 134/80 mmHg   Blood Pressure (Exit) 140/93 mmHg   Heart Rate (Admit) 70 bpm   Heart Rate (Exercise) 96 bpm   Heart Rate (Exit) 80 bpm   Oxygen Saturation (Admit) 94 %   Oxygen Saturation (Exercise) 95 %   Oxygen Saturation (Exit) 94 %   Rating of Perceived Exertion (Exercise) 13   Perceived Dyspnea (Exercise) 3   Duration Progress to 45 minutes of aerobic exercise without signs/symptoms of physical distress   Intensity THRR unchanged  40-80% HRR   Progression   Progression Continue to progress workloads to maintain intensity without signs/symptoms of physical distress.   Resistance Training   Training Prescription Yes   Weight orange bands   Reps 10-12   Treadmill   MPH 2   Grade 0   Minutes 15   Bike   Level 0.8   Minutes 15   NuStep   Level 6   Minutes 15     Goals Met:  Independence with exercise equipment Improved SOB with ADL's Using PLB without cueing & demonstrates good technique Exercise tolerated  well No report of cardiac concerns or symptoms Strength training completed today  Goals Unmet:  BP-BP recheck at discharge 136/88  Comments: Service time is from 1330 to 1505   Dr. Rush Farmer is Medical Director for Pulmonary Rehab at [facility].

## 2015-10-15 ENCOUNTER — Encounter (HOSPITAL_COMMUNITY)
Admission: RE | Admit: 2015-10-15 | Discharge: 2015-10-15 | Disposition: A | Discharge status: Home / Self Care | Payer: Non-veteran care | Source: Ambulatory Visit | Attending: Internal Medicine | Admitting: Internal Medicine

## 2015-10-15 DIAGNOSIS — J441 Chronic obstructive pulmonary disease with (acute) exacerbation: Secondary | ICD-10-CM | POA: Diagnosis present

## 2015-10-15 NOTE — Progress Notes (Signed)
Daily Session Note  Patient Details  Name: Sean [name] MRN: [id number] Date of Birth: [birth year] Referring Provider:  No ref. provider found  Encounter Date: 10/15/2015  Check In:     Session Check In - 10/15/15 1417    Check-In   Location [facility]   Staff Present Rosebud Poles, RN, BSN;Ramon Dredge, RN, MHA;Jessica Luan Pulling, MA, ACSM RCEP, Exercise Physiologist;Neveen Daponte Ysidro Evert, RN   Supervising physician immediately available to respond to emergencies [facility] immediately available   Physician(s) Dr. Marily Memos   Medication changes reported     No   Fall or balance concerns reported    No   Warm-up and Cool-down Performed as group-led instruction   Resistance Training Performed Yes   VAD Patient? No   Pain Assessment   Currently in Pain? No/denies   Multiple Pain Sites No      Capillary Blood Glucose: No results found for this or any previous visit (from the past 24 hour(s)).      Exercise Prescription Changes - 10/15/15 1600    Exercise Review   Progression No   Response to Exercise   Blood Pressure (Admit) 136/70 mmHg   Blood Pressure (Exercise) 148/90 mmHg   Blood Pressure (Exit) 126/70 mmHg   Heart Rate (Admit) 66 bpm   Heart Rate (Exercise) 100 bpm   Heart Rate (Exit) 83 bpm   Oxygen Saturation (Admit) 93 %   Oxygen Saturation (Exercise) 93 %   Oxygen Saturation (Exit) 93 %   Rating of Perceived Exertion (Exercise) 11   Perceived Dyspnea (Exercise) 2   Duration Progress to 45 minutes of aerobic exercise without signs/symptoms of physical distress   Intensity Other (comment)  40-80% of THRR   Progression   Progression Continue to progress workloads to maintain intensity without signs/symptoms of physical distress.  40-80% of THRR   Resistance Training   Training Prescription Yes   Weight orange band   Reps 10-12   Treadmill   MPH 2   Grade 0   Minutes 15   Bike   Level 0.8   Minutes 15     Goals Met:  Independence with  exercise equipment Using PLB without cueing & demonstrates good technique Exercise tolerated well No report of cardiac concerns or symptoms Strength training completed today  Goals Unmet:  Not Applicable  Comments: Service time is from 1330 to 1530    Dr. Rush Farmer is Medical Director for Pulmonary Rehab at [facility].

## 2015-10-20 ENCOUNTER — Encounter (HOSPITAL_COMMUNITY)
Admission: RE | Admit: 2015-10-20 | Discharge: 2015-10-20 | Disposition: A | Discharge status: Home / Self Care | Payer: Non-veteran care | Source: Ambulatory Visit | Attending: Internal Medicine | Admitting: Internal Medicine

## 2015-10-20 DIAGNOSIS — J441 Chronic obstructive pulmonary disease with (acute) exacerbation: Secondary | ICD-10-CM | POA: Diagnosis not present

## 2015-10-20 NOTE — Progress Notes (Signed)
Daily Session Note  Patient Details  Name: Sean [name] MRN: [id number] Date of Birth: [birth year] Referring Provider:  No ref. provider found  Encounter Date: 10/20/2015  Check In:     Session Check In - 10/20/15 1603    Check-In   Location [facility]   Staff Present Rosebud Poles, RN, Deland Pretty, MS, ACSM CEP, Exercise Physiologist;Annedrea Rosezella [place], RN, MHA;Portia Rollene Rotunda, RN, BSN   Supervising physician immediately available to respond to emergencies [facility] immediately available   Physician(s) Dr. Marily Memos   Medication changes reported     No   Fall or balance concerns reported    No   Warm-up and Cool-down Performed as group-led instruction   Resistance Training Performed Yes   VAD Patient? No   Pain Assessment   Currently in Pain? No/denies   Multiple Pain Sites No      Capillary Blood Glucose: No results found for this or any previous visit (from the past 24 hour(s)).      Exercise Prescription Changes - 10/20/15 1600    Exercise Review   Progression No   Response to Exercise   Blood Pressure (Admit) 112/60 mmHg   Blood Pressure (Exercise) 132/70 mmHg   Blood Pressure (Exit) 122/70 mmHg   Heart Rate (Admit) 104 bpm   Heart Rate (Exercise) 115 bpm   Heart Rate (Exit) 83 bpm   Oxygen Saturation (Admit) 93 %   Oxygen Saturation (Exercise) 92 %   Oxygen Saturation (Exit) 99 %   Rating of Perceived Exertion (Exercise) 13   Perceived Dyspnea (Exercise) 2   Symptoms none   Comments none   Duration Progress to 45 minutes of aerobic exercise without signs/symptoms of physical distress   Intensity Other (comment)   Progression   Progression --  40-80% HRR   Resistance Training   Training Prescription Yes   Weight blue bands   Reps 10-12   Interval Training   Interval Training No   Treadmill   MPH 2   Grade 0   Minutes 15   Bike   Level 0.5   Minutes 15   NuStep   Level 6   Minutes 15   METs 2.6     Goals Met:   Proper associated with RPD/PD & O2 Sat Independence with exercise equipment Queuing for purse lip breathing Strength training completed today  Goals Unmet:  Not Applicable  Comments: Service time is from 1330 to 1500    Dr. Rush Farmer is Medical Director for Pulmonary Rehab at [facility].

## 2015-10-22 ENCOUNTER — Encounter (HOSPITAL_COMMUNITY)
Admission: RE | Admit: 2015-10-22 | Discharge: 2015-10-22 | Disposition: A | Discharge status: Home / Self Care | Payer: Non-veteran care | Source: Ambulatory Visit | Attending: Internal Medicine | Admitting: Internal Medicine

## 2015-10-22 DIAGNOSIS — J441 Chronic obstructive pulmonary disease with (acute) exacerbation: Secondary | ICD-10-CM | POA: Diagnosis not present

## 2015-10-22 NOTE — Progress Notes (Signed)
Daily Session Note  Patient Details  Name: Sean [name] MRN: [id number] Date of Birth: December 02, 1950 Referring Provider:  No ref. provider found  Encounter Date: 10/22/2015  Check In:     Session Check In - 10/22/15 1651    Pain Assessment   Currently in Pain? Yes   Pain Score 5    Pain Location Leg   Pain Orientation Right;Lower   Pain Descriptors / Indicators Discomfort   Pain Type Acute pain   Pain Onset Yesterday   Pain Frequency Intermittent   Aggravating Factors  standing and walking   Pain Relieving Factors sitting   Effect of Pain on Daily Activities slower pace walking   Multiple Pain Sites No      Capillary Blood Glucose: No results found for this or any previous visit (from the past 24 hour(s)).      Exercise Prescription Changes - 10/22/15 1600    Exercise Review   Progression No   Response to Exercise   Blood Pressure (Admit) 150/82 mmHg   Blood Pressure (Exercise) 140/84 mmHg   Blood Pressure (Exit) 142/84 mmHg   Heart Rate (Admit) 82 bpm   Heart Rate (Exercise) 98 bpm   Heart Rate (Exit) 83 bpm   Oxygen Saturation (Admit) 92 %   Oxygen Saturation (Exercise) 95 %   Oxygen Saturation (Exit) 94 %   Rating of Perceived Exertion (Exercise) 9   Perceived Dyspnea (Exercise) 1   Symptoms right lower leg redness and swelling   Comments noticed yesterday   Duration Progress to 45 minutes of aerobic exercise without signs/symptoms of physical distress   Intensity THRR unchanged   Progression   Progression Continue to progress workloads to maintain intensity without signs/symptoms of physical distress.   Resistance Training   Training Prescription Yes   Weight blue bands   Reps 10-12   Interval Training   Interval Training No   Treadmill   MPH 2   Grade 0   Minutes 15   NuStep   Level 6   Minutes 15   METs 2.9     Goals Met:  Proper associated with RPD/PD & O2 Sat Independence with exercise equipment Exercise tolerated well Strength training  completed today  Goals Unmet:  Not Applicable  Comments: Service time is from 1330 to 1530  I called the [place] in [place] to get an appointment for [facility] to be seen for his right lower leg issue.  He was instructed to go to the ED, he did not want to do that and accepted an appointment for tomorrow, March 10 @ 9:00 am.  He was instructed to go to the ED if he has an problems tonight.  He agreed and went home in no acute distress.    Dr. Rush Farmer is Medical Director for Pulmonary Rehab at [facility].

## 2015-10-27 ENCOUNTER — Encounter (HOSPITAL_COMMUNITY)
Admission: RE | Admit: 2015-10-27 | Discharge: 2015-10-27 | Disposition: A | Discharge status: Home / Self Care | Payer: Non-veteran care | Source: Ambulatory Visit | Attending: Internal Medicine | Admitting: Internal Medicine

## 2015-10-27 DIAGNOSIS — J441 Chronic obstructive pulmonary disease with (acute) exacerbation: Secondary | ICD-10-CM | POA: Diagnosis not present

## 2015-10-27 NOTE — Progress Notes (Signed)
Daily Session Note  Patient Details  Name: Sean [name] MRN: [id number] Date of Birth: [birth year] Referring Provider:  No ref. provider found  Encounter Date: 10/27/2015  Check In:     Session Check In - 10/27/15 1611    Check-In   Location [facility]   Staff Present Rosebud Poles, RN, BSN;Pawan Knechtel Ysidro Evert, RN;Olinty Celesta Aver, MS, ACSM CEP, Exercise Physiologist   Supervising physician immediately available to respond to emergencies [facility] immediately available   Physician(s) Dr Marily Memos   Medication changes reported     No   Fall or balance concerns reported    No   Warm-up and Cool-down Performed as group-led instruction   Resistance Training Performed Yes   VAD Patient? No   Pain Assessment   Currently in Pain? No/denies   Multiple Pain Sites No      Capillary Blood Glucose: No results found for this or any previous visit (from the past 24 hour(s)).      Exercise Prescription Changes - 10/27/15 1600    Exercise Review   Progression Yes   Response to Exercise   Blood Pressure (Admit) 130/72 mmHg   Blood Pressure (Exercise) 124/80 mmHg   Blood Pressure (Exit) 138/82 mmHg   Heart Rate (Admit) 87 bpm   Heart Rate (Exercise) 100 bpm   Heart Rate (Exit) 92 bpm   Oxygen Saturation (Admit) 94 %   Oxygen Saturation (Exercise) 92 %   Oxygen Saturation (Exit) 95 %   Rating of Perceived Exertion (Exercise) 9   Perceived Dyspnea (Exercise) 2   Duration Progress to 45 minutes of aerobic exercise without signs/symptoms of physical distress   Intensity THRR unchanged   Progression   Progression Continue to progress workloads to maintain intensity without signs/symptoms of physical distress.   Resistance Training   Training Prescription Yes   Weight blue bands   Reps 10-12   Interval Training   Interval Training No   Treadmill   MPH 2   Grade 0   Minutes 15   Bike   Level 0.8   Minutes 15   NuStep   Level 7   Minutes 15   METs 2.9     Goals  Met:  Independence with exercise equipment Improved SOB with ADL's Using PLB without cueing & demonstrates good technique Exercise tolerated well No report of cardiac concerns or symptoms Strength training completed today  Goals Unmet:  Not Applicable  Comments: Service time is from 1330 to 1530    Dr. Rush Farmer is Medical Director for Pulmonary Rehab at [facility].

## 2015-10-29 ENCOUNTER — Encounter (HOSPITAL_COMMUNITY)
Admission: RE | Admit: 2015-10-29 | Discharge: 2015-10-29 | Disposition: A | Discharge status: Home / Self Care | Payer: Non-veteran care | Source: Ambulatory Visit | Attending: Internal Medicine | Admitting: Internal Medicine

## 2015-10-29 NOTE — Progress Notes (Signed)
Pulmonary Rehab Discharge Note  Sean [name] has graduated from pulmonary rehab.  He was a pleasure to have in the program, he pushed himself to regain his strength and stamina and actually increased his 6 minute walk test by 89 feet, increased his quality of life by 15%.  He met his personal goals which were to increase his strength and stamina and to learn more about his COPD and shortness of breath.  He attended 36 sessions which were approved by the [employer].  His home exercise plans are to walk at home on nice days and walk on his treadmill a minium of 3 days per week.

## 2015-10-29 NOTE — Progress Notes (Signed)
Sean [name] completed a Six-Minute Walk Test on 10/29/15 . Sean [name] walked 1547 feet without any rest breaks.  The patient's lowest oxygen saturation was 91%RA, highest heart rate was 104 bpm, and highest blood pressure was 136/84. Patient stated that his L knee hindered their walk test, pain 1/10 but able to complete walk test without an increase in pain.    Sean [name] [employer].

## 2015-11-03 ENCOUNTER — Encounter (HOSPITAL_COMMUNITY): Payer: Non-veteran care

## 2015-11-05 ENCOUNTER — Encounter (HOSPITAL_COMMUNITY): Payer: Non-veteran care

## 2015-11-10 ENCOUNTER — Encounter (HOSPITAL_COMMUNITY): Payer: Non-veteran care

## 2015-11-12 ENCOUNTER — Encounter (HOSPITAL_COMMUNITY): Payer: Non-veteran care

## 2015-11-17 ENCOUNTER — Encounter (HOSPITAL_COMMUNITY): Payer: Non-veteran care

## 2015-11-19 ENCOUNTER — Encounter (HOSPITAL_COMMUNITY): Payer: Non-veteran care

## 2015-11-24 ENCOUNTER — Encounter (HOSPITAL_COMMUNITY): Payer: Non-veteran care

## 2015-11-26 ENCOUNTER — Encounter (HOSPITAL_COMMUNITY): Payer: Non-veteran care

## 2015-12-01 ENCOUNTER — Encounter (HOSPITAL_COMMUNITY): Payer: Non-veteran care

## 2015-12-03 ENCOUNTER — Encounter (HOSPITAL_COMMUNITY): Payer: Non-veteran care

## 2015-12-08 ENCOUNTER — Encounter (HOSPITAL_COMMUNITY): Payer: Non-veteran care

## 2015-12-10 ENCOUNTER — Encounter (HOSPITAL_COMMUNITY): Payer: Non-veteran care

## 2015-12-15 ENCOUNTER — Encounter (HOSPITAL_COMMUNITY): Payer: Non-veteran care

## 2016-06-25 ENCOUNTER — Encounter (HOSPITAL_COMMUNITY): Payer: Self-pay

## 2016-06-25 ENCOUNTER — Emergency Department (HOSPITAL_COMMUNITY)
Admission: EM | Admit: 2016-06-25 | Discharge: 2016-06-25 | Disposition: A | Discharge status: Home / Self Care | Payer: Non-veteran care | Attending: Emergency Medicine | Admitting: Emergency Medicine

## 2016-06-25 DIAGNOSIS — Z87891 Personal history of nicotine dependence: Secondary | ICD-10-CM | POA: Insufficient documentation

## 2016-06-25 DIAGNOSIS — Z7982 Long term (current) use of aspirin: Secondary | ICD-10-CM | POA: Insufficient documentation

## 2016-06-25 DIAGNOSIS — I10 Essential (primary) hypertension: Secondary | ICD-10-CM | POA: Diagnosis not present

## 2016-06-25 DIAGNOSIS — R21 Rash and other nonspecific skin eruption: Secondary | ICD-10-CM | POA: Diagnosis not present

## 2016-06-25 DIAGNOSIS — J449 Chronic obstructive pulmonary disease, unspecified: Secondary | ICD-10-CM | POA: Diagnosis not present

## 2016-06-25 HISTORY — DX: Chronic obstructive pulmonary disease, unspecified: J44.9

## 2016-06-25 MED ORDER — KETOCONAZOLE 2 % EX CREA: 1.0000 "application " | TOPICAL_CREAM | Freq: Two times a day (BID) | CUTANEOUS | 0 refills | Status: DC

## 2016-06-25 NOTE — ED Provider Notes (Signed)
[facility] DEPT Provider Note   CSN: [id number] Arrival date & time: 06/25/16  0802     History   Chief Complaint Chief Complaint  Patient presents with  . Rash    HPI Sean [name] is a 65 y.o. male who presents with a rash. PMH significant for hx of cellulitis, arthritis, COPD, HTN. He states that over the past month he has had a worsening itchy scaly red rash on his bilateral forearms. He denies pain or drainage. He states he doesn't go outside much but does have a dog. Denies hx of skin disorder such as eczema or psoriasis. Denies allergies or changes in soaps, detergents, lotions, etc. He has tried a "age spot" cream for bruises which he states is a new product he found OTC which has not helped. Denies fever or chills.  HPI  Past Medical History:  Diagnosis Date  . Arthritis   . COPD (chronic obstructive pulmonary disease) ([place])   . Hypertension     There are no active problems to display for this patient.   History reviewed. No pertinent surgical history.     Home Medications    Prior to Admission medications   Medication Sig Start Date End Date Taking? Authorizing Provider  albuterol (PROVENTIL HFA;VENTOLIN HFA) 108 (90 BASE) MCG/ACT inhaler Inhale 2 puffs into the lungs every 6 (six) hours as needed for wheezing or shortness of breath.    Historical Provider, MD  albuterol (PROVENTIL) (2.5 MG/3ML) 0.083% nebulizer solution Take 2.5 mg by nebulization every 6 (six) hours as needed for wheezing or shortness of breath.    Historical Provider, MD  amLODipine (NORVASC) 10 MG tablet Take 10 mg by mouth daily.    Historical Provider, MD  amLODipine-benazepril (LOTREL) 5-10 MG capsule Take 1 capsule by mouth daily.    Historical Provider, MD  aspirin (ASPIRIN EC) 81 MG EC tablet Take 81 mg by mouth daily. Swallow whole.    Historical Provider, MD  benazepril (LOTENSIN) 10 MG tablet Take 10 mg by mouth daily.    Historical Provider, MD  cholecalciferol (VITAMIN D) 1000  UNITS tablet Take 1,000 Units by mouth 2 (two) times daily.    Historical Provider, MD  ketoconazole (NIZORAL) 2 % cream Apply 1 application topically 2 (two) times daily. For the next month 06/25/16   Recardo Evangelist, PA-C  meloxicam (MOBIC) 15 MG tablet Take 15 mg by mouth daily.    Historical Provider, MD  metoprolol (TOPROL-XL) 200 MG 24 hr tablet Take 200 mg by mouth daily.    Historical Provider, MD  tiotropium (SPIRIVA) 18 MCG inhalation capsule Place 18 mcg into inhaler and inhale daily.    Historical Provider, MD    Family History Family History  Problem Relation Age of Onset  . COPD Mother   . Diabetes Mother   . Hypertension Father   . COPD Father   . Heart disease Father   . Arthritis Sister     Social History Social History  Substance Use Topics  . Smoking status: Former Smoker    Types: Cigarettes    Quit date: 06/25/2013  . Smokeless tobacco: Former Systems developer    Quit date: 06/25/1976  . Alcohol use 18.0 oz/week    30 Cans of beer per week     Allergies   Patient has no known allergies.   Review of Systems Review of Systems  Constitutional: Negative for chills and fever.  Skin: Positive for rash. Negative for wound.     Physical Exam  Updated Vital Signs BP 152/89   Pulse 88   Temp 97.9 F (36.6 C) (Oral)   Resp 20   SpO2 96%   Physical Exam  Constitutional: He is oriented to person, place, and time. He appears well-developed and well-nourished. No distress.  HENT:  Head: Normocephalic and atraumatic.  Eyes: Conjunctivae are normal. Pupils are equal, round, and reactive to light. Right eye exhibits no discharge. Left eye exhibits no discharge. No scleral icterus.  Neck: Normal range of motion.  Cardiovascular: Normal rate.   Pulmonary/Chest: Effort normal. No respiratory distress.  Abdominal: He exhibits no distension.  Neurological: He is alert and oriented to person, place, and time.  Skin: Skin is warm and dry. Rash (Erythematous, dry, scaly  and flaky rash on the dorsal aspect of bilateral forearms. No tenderness to palpation. No drainage or warmth) noted.  Psychiatric: He has a normal mood and affect. His behavior is normal.  Nursing note and vitals reviewed.    ED Treatments / Results  Labs (all labs ordered are listed, but only abnormal results are displayed) Labs Reviewed - No data to display  EKG  EKG Interpretation None       Radiology No results found.  Procedures Procedures (including critical care time)  Medications Ordered in ED Medications - No data to display   Initial Impression / Assessment and Plan / ED Course  I have reviewed the triage vital signs and the nursing notes.  Pertinent labs & imaging results that were available during my care of the patient were reviewed by me and considered in my medical decision making (see chart for details).  Clinical Course    66 year old male presents with a rash. Will treat for fungal infection. No blisters, no pustules, no warmth, no draining sinus tracts, no superficial abscesses, no bullous impetigo, no vesicles, no desquamation, no target lesions with dusky purpura or a central bulla. Not tender to touch. No concern for superimposed infection. No concern for SJS, TEN, TSS, tick borne illness, syphilis or other life-threatening condition. Rx given for ketoconazole cream and will have him follow up with PCP - he has an appointment scheduled for this upcoming Wed. Patient is NAD, non-toxic, with stable VS. Patient is informed of clinical course, understands medical decision making process, and agrees with plan. Opportunity for questions provided and all questions answered. Return precautions given.   Final Clinical Impressions(s) / ED Diagnoses   Final diagnoses:  Rash    New Prescriptions New Prescriptions   KETOCONAZOLE (NIZORAL) 2 % CREAM    Apply 1 application topically 2 (two) times daily. For the next month     Recardo Evangelist, PA-C 06/25/16  [id number]    Fredia Sorrow, MD 06/27/16 [phone]

## 2016-06-25 NOTE — ED Triage Notes (Signed)
Pt c/o redness and rash to right and left forearms x 4 weeks. Hx cellulitis in lower extremities in past.

## 2017-12-22 ENCOUNTER — Telehealth (HOSPITAL_COMMUNITY): Payer: Self-pay

## 2017-12-22 NOTE — Telephone Encounter (Signed)
Patient called to schedule orientation for Pulmonary Rehab - Orientation is on 12/29/17 at 9:30am. Patient will attend the 1:30pm exc class. Patient stated he will come to pick up packet.

## 2017-12-29 ENCOUNTER — Encounter (HOSPITAL_COMMUNITY)
Admission: RE | Admit: 2017-12-29 | Discharge: 2017-12-29 | Disposition: A | Discharge status: Home / Self Care | Payer: No Typology Code available for payment source | Source: Ambulatory Visit | Attending: Pulmonary Disease | Admitting: Pulmonary Disease

## 2017-12-29 VITALS — BP 161/84 | HR 63 | Temp 97.7°F | Resp 16 | Ht 65.75 in | Wt 202.2 lb

## 2017-12-29 DIAGNOSIS — Z87891 Personal history of nicotine dependence: Secondary | ICD-10-CM | POA: Insufficient documentation

## 2017-12-29 DIAGNOSIS — Z79899 Other long term (current) drug therapy: Secondary | ICD-10-CM | POA: Diagnosis not present

## 2017-12-29 DIAGNOSIS — J449 Chronic obstructive pulmonary disease, unspecified: Secondary | ICD-10-CM | POA: Insufficient documentation

## 2017-12-29 DIAGNOSIS — M1990 Unspecified osteoarthritis, unspecified site: Secondary | ICD-10-CM | POA: Insufficient documentation

## 2017-12-29 DIAGNOSIS — I10 Essential (primary) hypertension: Secondary | ICD-10-CM | POA: Insufficient documentation

## 2017-12-29 DIAGNOSIS — Z7951 Long term (current) use of inhaled steroids: Secondary | ICD-10-CM | POA: Insufficient documentation

## 2017-12-29 DIAGNOSIS — J439 Emphysema, unspecified: Secondary | ICD-10-CM | POA: Insufficient documentation

## 2017-12-29 NOTE — Progress Notes (Signed)
Sean [name] 67 y.o. male Pulmonary Rehab Orientation Note Patient, who is a veteran with authorization to participate in pulmonary rehab, arrived today in Cardiac and Pulmonary Rehab for orientation to Pulmonary Rehab. He is a prior participant in the pulmonary rehab program. He arrived ambulatory from the valet/main entrance of the hospital/ He does not carry portable oxygen.  Color good, skin warm and dry. Pt with blue tinged nose. Patient is oriented to time and place. Patient's medical history, psychosocial health, and medications reviewed. Psychosocial assessment reveals pt lives alone. Pt is currently retired. Pt hobbies include working in yard outside, doing odd jobs in the home. Pt reports his stress level is low to none.  Pt reports every day stress that is not impactful to his life.  Pt does not exhibit  exhibits signs of depression. Pt denies any thoughts of depression and anxiety Pt feels supportive in his rehab participation. Pt reports no problems or issues  with sleeping. PHQ2/9 score 0/1.  Pt reports several days of feeling tired and low energy which he attributes to his COPD.Pt shows good  coping skills with positive outlook .  Will continue to monitor and evaluate progress toward psychosocial goal(s) of maintaining his positive outlook. Physical assessment reveals heart rate is 63, breath sounds clear to auscultation, no wheezes, rales, or rhonchi. Large abdomen with umbilical hernia.  Pt denies any discomfort. Grip strength equal, strong. Distal pulses palpable with trace swelling to bilateral ankles. Patient reports he does take medications as prescribed. Patient states he follows a Regular diet. The patient has been trying to lose weight through a healthy diet and exercise program.. Patient's weight will be monitored closely. Demonstration and practice of PLB using pulse oximeter. Patient able to return demonstration satisfactorily. Safety and hand hygiene in the exercise area reviewed with  patient. Patient voices understanding of the information reviewed. Department expectations discussed with patient and achievable goals were set. The patient shows enthusiasm about attending the program and we look forward to working with this nice patient. The patient is scheduled for a 6 min walk test on 5/21 and to begin exercise on 5/28. Maurice Small RN, BSN Cardiac and Pulmonary Rehab Nurse Navigator  45 minutes was spent on a variety of activities such as assessment of the patient, obtaining baseline data including height, weight, BMI, and grip strength, verifying medical history, allergies, and current medications, and teaching patient strategies for performing tasks with less respiratory effort with emphasis on pursed lip breathing. [id number]

## 2018-01-02 ENCOUNTER — Encounter (HOSPITAL_COMMUNITY)
Admission: RE | Admit: 2018-01-02 | Discharge: 2018-01-02 | Disposition: A | Discharge status: Home / Self Care | Payer: No Typology Code available for payment source | Source: Ambulatory Visit | Attending: Pulmonary Disease | Admitting: Pulmonary Disease

## 2018-01-02 DIAGNOSIS — J449 Chronic obstructive pulmonary disease, unspecified: Secondary | ICD-10-CM

## 2018-01-02 NOTE — Progress Notes (Signed)
Pulmonary Individual Treatment Plan  Patient Details  Name: Sean [name] MRN: [id number] Date of Birth: [birth year] Referring Provider:     Pulmonary Rehab Walk Test from 01/02/2018 in [place]  Referring Provider  Dr. Gwenette Greet      Initial Encounter Date:    Pulmonary Rehab Walk Test from 01/02/2018 in [place]  Date  01/02/18  Referring Provider  Dr. Gwenette Greet      Visit Diagnosis: Chronic obstructive pulmonary disease, unspecified COPD type ([place])  Patient's Home Medications on Admission:   Current Outpatient Medications:  .  albuterol (PROVENTIL HFA;VENTOLIN HFA) 108 (90 BASE) MCG/ACT inhaler, Inhale 2 puffs into the lungs every 6 (six) hours as needed for wheezing or shortness of breath., Disp: , Rfl:  .  albuterol (PROVENTIL) (2.5 MG/3ML) 0.083% nebulizer solution, Take 2.5 mg by nebulization every 6 (six) hours as needed for wheezing or shortness of breath., Disp: , Rfl:  .  amLODipine (NORVASC) 10 MG tablet, Take 10 mg by mouth daily., Disp: , Rfl:  .  amLODipine (NORVASC) 10 MG tablet, Take 10 mg by mouth daily., Disp: , Rfl:  .  benazepril (LOTENSIN) 10 MG tablet, Take 10 mg by mouth daily. Take 1/2 daily, Disp: , Rfl:  .  budesonide-formoterol (SYMBICORT) 160-4.5 MCG/ACT inhaler, Inhale 2 puffs into the lungs 2 (two) times daily. Rinse mouth well with water after each use, Disp: , Rfl:  .  cholecalciferol (VITAMIN D) 1000 UNITS tablet, Take 1,000 Units by mouth 2 (two) times daily., Disp: , Rfl:  .  hydrocortisone 2.5 % cream, Apply topically 3 (three) times daily. rectally, Disp: , Rfl:  .  meloxicam (MOBIC) 15 MG tablet, Take 15 mg by mouth daily., Disp: , Rfl:  .  metoprolol (TOPROL-XL) 200 MG 24 hr tablet, Take 200 mg by mouth daily., Disp: , Rfl:  .  multivitamin-lutein (OCUVITE-LUTEIN) CAPS capsule, Take 1 capsule by mouth daily., Disp: , Rfl:  .  niacin 100 MG tablet, Take 100 mg by mouth at bedtime., Disp: ,  Rfl:  .  ondansetron (ZOFRAN) 8 MG tablet, Take by mouth every 8 (eight) hours as needed for nausea or vomiting., Disp: , Rfl:  .  pantoprazole (PROTONIX) 40 MG tablet, Take 40 mg by mouth 2 (two) times daily., Disp: , Rfl:  .  thiamine 100 MG tablet, Take 100 mg by mouth daily., Disp: , Rfl:  .  tiotropium (SPIRIVA) 18 MCG inhalation capsule, Place 18 mcg into inhaler and inhale daily., Disp: , Rfl:  .  urea (CARMOL) 20 % cream, Apply topically 3 (three) times daily., Disp: , Rfl:   Past Medical History: Past Medical History:  Diagnosis Date  . Arthritis   . COPD (chronic obstructive pulmonary disease) ([place])   . Hypertension     Tobacco Use: Social History   Tobacco Use  Smoking Status Former Smoker  . Types: Cigarettes  . Last attempt to quit: 06/25/2013  . Years since quitting: 4.5  Smokeless Tobacco Former Systems developer  . Quit date: 06/25/1976    Labs: Recent Review Flowsheet Data    There is no flowsheet data to display.      Capillary Blood Glucose: No results found for: GLUCAP   Pulmonary Assessment Scores: Pulmonary Assessment Scores    Row Name 01/02/18 1633         ADL UCSD   ADL Phase  Entry       mMRC Score   mMRC Score  3  Pulmonary Function Assessment:   Exercise Target Goals: Date: 01/02/18  Exercise Program Goal: Individual exercise prescription set using results from initial 6 min walk test and THRR while considering  patient's activity barriers and safety.    Exercise Prescription Goal: Initial exercise prescription builds to 30-45 minutes a day of aerobic activity, 2-3 days per week.  Home exercise guidelines will be given to patient during program as part of exercise prescription that the participant will acknowledge.  Activity Barriers & Risk Stratification: Activity Barriers & Cardiac Risk Stratification - 12/29/17 1029      Activity Barriers & Cardiac Risk Stratification   Activity Barriers  Joint Problems;Arthritis bilateral  knee pain       6 Minute Walk: 6 Minute Walk    Row Name 01/02/18 1635         6 Minute Walk   Phase  Initial     Distance  1443 feet     Walk Time  6 minutes     # of Rest Breaks  0     MPH  2.73     METS  3.07     RPE  13     Perceived Dyspnea   2     Symptoms  Yes (comment)     Comments  LEFT CALF BURNING     Resting HR  76 bpm     Resting BP  130/80     Resting Oxygen Saturation   92 %     Exercise Oxygen Saturation  during 6 min walk  89 %     Max Ex. HR  114 bpm     Max Ex. BP  152/84       Interval HR   1 Minute HR  88     2 Minute HR  90     3 Minute HR  82     4 Minute HR  87     5 Minute HR  88     6 Minute HR  114     2 Minute Post HR  82     Interval Heart Rate?  Yes       Interval Oxygen   Interval Oxygen?  Yes     Baseline Oxygen Saturation %  92 %     1 Minute Oxygen Saturation %  93 %     1 Minute Liters of Oxygen  0 L     2 Minute Oxygen Saturation %  89 %     2 Minute Liters of Oxygen  0 L     3 Minute Oxygen Saturation %  89 %     3 Minute Liters of Oxygen  0 L     4 Minute Oxygen Saturation %  89 %     4 Minute Liters of Oxygen  0 L     5 Minute Oxygen Saturation %  90 %     5 Minute Liters of Oxygen  0 L     6 Minute Oxygen Saturation %  89 %     6 Minute Liters of Oxygen  0 L     2 Minute Post Oxygen Saturation %  95 %     2 Minute Post Liters of Oxygen  0 L        Oxygen Initial Assessment: Oxygen Initial Assessment - 12/29/17 1004      Home Oxygen   Home Oxygen Device  None    Sleep Oxygen Prescription  None  Home Exercise Oxygen Prescription  None    Home at Rest Exercise Oxygen Prescription  None      Intervention   Short Term Goals  To learn and exhibit compliance with exercise, home and travel O2 prescription;To learn and understand importance of maintaining oxygen saturations>88%;To learn and demonstrate proper use of respiratory medications;To learn and understand importance of monitoring SPO2 with pulse oximeter and  demonstrate accurate use of the pulse oximeter.;To learn and demonstrate proper pursed lip breathing techniques or other breathing techniques.    Long  Term Goals  Exhibits compliance with exercise, home and travel O2 prescription;Verbalizes importance of monitoring SPO2 with pulse oximeter and return demonstration;Maintenance of O2 saturations>88%;Exhibits proper breathing techniques, such as pursed lip breathing or other method taught during program session;Compliance with respiratory medication;Demonstrates proper use of MDI's       Oxygen Re-Evaluation: Oxygen Re-Evaluation    Row Name 01/02/18 1632             Home Oxygen   Home Exercise Oxygen Prescription  None       Home at Rest Exercise Oxygen Prescription  None          Oxygen Discharge (Final Oxygen Re-Evaluation): Oxygen Re-Evaluation - 01/02/18 1632      Home Oxygen   Home Exercise Oxygen Prescription  None    Home at Rest Exercise Oxygen Prescription  None       Initial Exercise Prescription: Initial Exercise Prescription - 01/02/18 1600      Date of Initial Exercise RX and Referring Provider   Date  01/02/18    Referring Provider  Dr. Gwenette Greet      Oxygen   Oxygen  --      Bike   Level  0.4    Minutes  17      NuStep   Level  2    SPM  80    Minutes  17    METs  1.5      Track   Laps  10    Minutes  17      Prescription Details   Frequency (times per week)  2    Duration  Progress to 45 minutes of aerobic exercise without signs/symptoms of physical distress      Intensity   THRR 40-80% of Max Heartrate  62-124    Ratings of Perceived Exertion  11-13    Perceived Dyspnea  0-4      Progression   Progression  Continue progressive overload as per policy without signs/symptoms or physical distress.      Resistance Training   Training Prescription  Yes    Weight  blue bands    Reps  10-15       Perform Capillary Blood Glucose checks as needed.  Exercise Prescription  Changes:   Exercise Comments:   Exercise Goals and Review: Exercise Goals    Row Name 12/29/17 1025             Exercise Goals   Increase Physical Activity  Yes       Intervention  Provide advice, education, support and counseling about physical activity/exercise needs.;Develop an individualized exercise prescription for aerobic and resistive training based on initial evaluation findings, risk stratification, comorbidities and participant's personal goals.       Expected Outcomes  Short Term: Attend rehab on a regular basis to increase amount of physical activity.;Long Term: Add in home exercise to make exercise part of routine and to increase amount of physical  activity.;Long Term: Exercising regularly at least 3-5 days a week.       Increase Strength and Stamina  Yes       Intervention  Provide advice, education, support and counseling about physical activity/exercise needs.;Develop an individualized exercise prescription for aerobic and resistive training based on initial evaluation findings, risk stratification, comorbidities and participant's personal goals.       Expected Outcomes  Short Term: Increase workloads from initial exercise prescription for resistance, speed, and METs.;Short Term: Perform resistance training exercises routinely during rehab and add in resistance training at home;Long Term: Improve cardiorespiratory fitness, muscular endurance and strength as measured by increased METs and functional capacity (6MWT)       Able to understand and use rate of perceived exertion (RPE) scale  Yes       Intervention  Provide education and explanation on how to use RPE scale       Expected Outcomes  Short Term: Able to use RPE daily in rehab to express subjective intensity level;Long Term:  Able to use RPE to guide intensity level when exercising independently       Able to understand and use Dyspnea scale  Yes       Intervention  Provide education and explanation on how to use  Dyspnea scale       Expected Outcomes  Short Term: Able to use Dyspnea scale daily in rehab to express subjective sense of shortness of breath during exertion;Long Term: Able to use Dyspnea scale to guide intensity level when exercising independently       Knowledge and understanding of Target Heart Rate Range (THRR)  Yes       Intervention  Provide education and explanation of THRR including how the numbers were predicted and where they are located for reference       Expected Outcomes  Short Term: Able to state/look up THRR;Long Term: Able to use THRR to govern intensity when exercising independently;Short Term: Able to use daily as guideline for intensity in rehab       Understanding of Exercise Prescription  Yes       Intervention  Provide education, explanation, and written materials on patient's individual exercise prescription       Expected Outcomes  Short Term: Able to explain program exercise prescription;Long Term: Able to explain home exercise prescription to exercise independently          Exercise Goals Re-Evaluation :   Discharge Exercise Prescription (Final Exercise Prescription Changes):   Nutrition:  Target Goals: Understanding of nutrition guidelines, daily intake of sodium 1500mg , cholesterol 200mg , calories 30% from fat and 7% or less from saturated fats, daily to have 5 or more servings of fruits and vegetables.  Biometrics:    Nutrition Therapy Plan and Nutrition Goals:   Nutrition Assessments:   Nutrition Goals Re-Evaluation:   Nutrition Goals Discharge (Final Nutrition Goals Re-Evaluation):   Psychosocial: Target Goals: Acknowledge presence or absence of significant depression and/or stress, maximize coping skills, provide positive support system. Participant is able to verbalize types and ability to use techniques and skills needed for reducing stress and depression.  Initial Review & Psychosocial Screening: Initial Psych Review & Screening -  12/29/17 1027      Initial Review   Current issues with  None Identified      Family Dynamics   Good Support System?  Yes    Comments  recently loss his dog of 5 years      Barriers   Psychosocial barriers  to participate in program  The patient should benefit from training in stress management and relaxation.      Screening Interventions   Interventions  Encouraged to exercise    Expected Outcomes  Long Term Goal: Stressors or current issues are controlled or eliminated.;Short Term goal: Identification and review with participant of any Quality of Life or Depression concerns found by scoring the questionnaire.       Quality of Life Scores:  Scores of 19 and below usually indicate a poorer quality of life in these areas.  A difference of  2-3 points is a clinically meaningful difference.  A difference of 2-3 points in the total score of the Quality of Life Index has been associated with significant improvement in overall quality of life, self-image, physical symptoms, and general health in studies assessing change in quality of life.   PHQ-9: Recent Review Flowsheet Data    Depression screen [facility] 2/9 12/29/2017 10/29/2015 06/29/2015   Decreased Interest 0 0 1   Down, Depressed, Hopeless 0 0 0   PHQ - 2 Score 0 0 1   Altered sleeping 0 - -   Tired, decreased energy 1 - -   Change in appetite 0 - -   Feeling bad or failure about yourself  0 - -   Trouble concentrating 0 - -   Moving slowly or fidgety/restless 0 - -   Suicidal thoughts 0 - -   PHQ-9 Score 1 - -   Difficult doing work/chores Somewhat difficult - -     Interpretation of Total Score  Total Score Depression Severity:  1-4 = Minimal depression, 5-9 = Mild depression, 10-14 = Moderate depression, 15-19 = Moderately severe depression, 20-27 = Severe depression   Psychosocial Evaluation and Intervention:   Psychosocial Re-Evaluation:   Psychosocial Discharge (Final Psychosocial  Re-Evaluation):   Education: Education Goals: Education classes will be provided on a weekly basis, covering required topics. Participant will state understanding/return demonstration of topics presented.  Learning Barriers/Preferences: Learning Barriers/Preferences - 12/29/17 1514      Learning Barriers/Preferences   Learning Barriers  None    Learning Preferences  Verbal Instruction;Individual Instruction;Group Instruction;Computer/Internet;Skilled Demonstration;Video;Written Material;Pictoral       Education Topics: Risk Factor Reduction:  -Group instruction that is supported by a PowerPoint presentation. Instructor discusses the definition of a risk factor, different risk factors for pulmonary disease, and how the heart and lungs work together.     Nutrition for Pulmonary Patient:  -Group instruction provided by PowerPoint slides, verbal discussion, and written materials to support subject matter. The instructor gives an explanation and review of healthy diet recommendations, which includes a discussion on weight management, recommendations for fruit and vegetable consumption, as well as protein, fluid, caffeine, fiber, sodium, sugar, and alcohol. Tips for eating when patients are short of breath are discussed.   Pursed Lip Breathing:  -Group instruction that is supported by demonstration and informational handouts. Instructor discusses the benefits of pursed lip and diaphragmatic breathing and detailed demonstration on how to preform both.     Oxygen Safety:  -Group instruction provided by PowerPoint, verbal discussion, and written material to support subject matter. There is an overview of "What is Oxygen" and "Why do we need it".  Instructor also reviews how to create a safe environment for oxygen use, the importance of using oxygen as prescribed, and the risks of noncompliance. There is a brief discussion on traveling with oxygen and resources the patient may utilize.   Oxygen  Equipment:  -  Group instruction provided by [facility] Staff utilizing handouts, written materials, and equipment demonstrations.   Signs and Symptoms:  -Group instruction provided by written material and verbal discussion to support subject matter. Warning signs and symptoms of infection, stroke, and heart attack are reviewed and when to call the physician/911 reinforced. Tips for preventing the spread of infection discussed.   Advanced Directives:  -Group instruction provided by verbal instruction and written material to support subject matter. Instructor reviews Advanced Directive laws and proper instruction for filling out document.   Pulmonary Video:  -Group video education that reviews the importance of medication and oxygen compliance, exercise, good nutrition, pulmonary hygiene, and pursed lip and diaphragmatic breathing for the pulmonary patient.   Exercise for the Pulmonary Patient:  -Group instruction that is supported by a PowerPoint presentation. Instructor discusses benefits of exercise, core components of exercise, frequency, duration, and intensity of an exercise routine, importance of utilizing pulse oximetry during exercise, safety while exercising, and options of places to exercise outside of rehab.     Pulmonary Medications:  -Verbally interactive group education provided by instructor with focus on inhaled medications and proper administration.   Anatomy and Physiology of the Respiratory System and Intimacy:  -Group instruction provided by PowerPoint, verbal discussion, and written material to support subject matter. Instructor reviews respiratory cycle and anatomical components of the respiratory system and their functions. Instructor also reviews differences in obstructive and restrictive respiratory diseases with examples of each. Intimacy, Sex, and Sexuality differences are reviewed with a discussion on how relationships can change when diagnosed with pulmonary  disease. Common sexual concerns are reviewed.   MD DAY -A group question and answer session with a medical doctor that allows participants to ask questions that relate to their pulmonary disease state.   OTHER EDUCATION -Group or individual verbal, written, or video instructions that support the educational goals of the pulmonary rehab program.   Holiday Eating Survival Tips:  -Group instruction provided by PowerPoint slides, verbal discussion, and written materials to support subject matter. The instructor gives patients tips, tricks, and techniques to help them not only survive but enjoy the holidays despite the onslaught of food that accompanies the holidays.   Knowledge Questionnaire Score:   Core Components/Risk Factors/Patient Goals at Admission: Personal Goals and Risk Factors at Admission - 12/29/17 1024      Core Components/Risk Factors/Patient Goals on Admission    Weight Management  Weight Loss    Improve shortness of breath with ADL's  Yes    Intervention  Provide education, individualized exercise plan and daily activity instruction to help decrease symptoms of SOB with activities of daily living.    Expected Outcomes  Short Term: Improve cardiorespiratory fitness to achieve a reduction of symptoms when performing ADLs    Hypertension  Yes    Intervention  Provide education on lifestyle modifcations including regular physical activity/exercise, weight management, moderate sodium restriction and increased consumption of fresh fruit, vegetables, and low fat dairy, alcohol moderation, and smoking cessation.;Monitor prescription use compliance.    Expected Outcomes  Short Term: Continued assessment and intervention until BP is < 140/30mm HG in hypertensive participants. < 130/20mm HG in hypertensive participants with diabetes, heart failure or chronic kidney disease.;Long Term: Maintenance of blood pressure at goal levels.       Core Components/Risk Factors/Patient Goals  Review:    Core Components/Risk Factors/Patient Goals at Discharge (Final Review):    ITP Comments: ITP Comments    Row Name 12/29/17 1004  ITP Comments  Dr. Jennet Maduro, Medical Director          Comments:

## 2018-01-09 ENCOUNTER — Encounter (HOSPITAL_COMMUNITY)
Admission: RE | Admit: 2018-01-09 | Discharge: 2018-01-09 | Disposition: A | Discharge status: Home / Self Care | Payer: No Typology Code available for payment source | Source: Ambulatory Visit | Attending: Pulmonary Disease | Admitting: Pulmonary Disease

## 2018-01-09 VITALS — Wt 203.3 lb

## 2018-01-09 DIAGNOSIS — J449 Chronic obstructive pulmonary disease, unspecified: Secondary | ICD-10-CM | POA: Diagnosis not present

## 2018-01-09 NOTE — Progress Notes (Signed)
Daily Session Note  Patient Details  Name: Sean [name] MRN: [id number] Date of Birth: 1951/03/14 Referring Provider:     Pulmonary Rehab Walk Test from 01/02/2018 in [place]  Referring Provider  Dr. Gwenette Greet      Encounter Date: 01/09/2018  Check In: Session Check In - 01/09/18 1330      Check-In   Location  [facility]    Staff Present  Cloyde Reams DiVincenzo, MS, ACSM RCEP, Exercise Physiologist;Annedrea Stackhouse, RN, MHA;Carlette Wilber Oliphant, RN, BSN    Supervising physician immediately available to respond to emergencies  [facility] immediately available    Physician(s)  Dr. Wyline Copas    Medication changes reported      No    Fall or balance concerns reported     No    Tobacco Cessation  No Change    Warm-up and Cool-down  Performed as group-led instruction    Resistance Training Performed  Yes    VAD Patient?  No      Pain Assessment   Currently in Pain?  No/denies    Multiple Pain Sites  No       Capillary Blood Glucose: No results found for this or any previous visit (from the past 24 hour(s)).  Exercise Prescription Changes - 01/09/18 1600      Response to Exercise   Blood Pressure (Admit)  146/82    Blood Pressure (Exercise)  138/80    Blood Pressure (Exit)  124/72    Heart Rate (Admit)  79 bpm    Heart Rate (Exercise)  99 bpm    Heart Rate (Exit)  71 bpm    Oxygen Saturation (Admit)  93 %    Oxygen Saturation (Exercise)  89 %    Oxygen Saturation (Exit)  94 %    Rating of Perceived Exertion (Exercise)  17    Perceived Dyspnea (Exercise)  1    Symptoms  none    Comments  pt was oriented to exercise equipment today    Duration  Progress to 45 minutes of aerobic exercise without signs/symptoms of physical distress    Intensity  THRR unchanged      Progression   Progression  Continue to progress workloads to maintain intensity without signs/symptoms of physical distress.      Resistance Training   Training  Prescription  Yes    Weight  blue bands    Reps  10-15 pt does 10 minutes of strength training      Bike   Level  0.6    Minutes  17      NuStep   Level  2    SPM  80    Minutes  17    METs  2.2      Track   Laps  16    Minutes  17       Social History   Tobacco Use  Smoking Status Former Smoker  . Types: Cigarettes  . Last attempt to quit: 06/25/2013  . Years since quitting: 4.5  Smokeless Tobacco Former Systems developer  . Quit date: 06/25/1976    Goals Met:  Exercise tolerated well No report of cardiac concerns or symptoms Strength training completed today  Goals Unmet:  Not Applicable  Comments: Service time is from 1:30 to 3:00    Dr. Rush Farmer is Medical Director for Pulmonary Rehab at [facility].

## 2018-01-11 ENCOUNTER — Encounter (HOSPITAL_COMMUNITY)
Admission: RE | Admit: 2018-01-11 | Discharge: 2018-01-11 | Disposition: A | Discharge status: Home / Self Care | Payer: No Typology Code available for payment source | Source: Ambulatory Visit | Attending: Pulmonary Disease | Admitting: Pulmonary Disease

## 2018-01-11 DIAGNOSIS — J449 Chronic obstructive pulmonary disease, unspecified: Secondary | ICD-10-CM | POA: Diagnosis not present

## 2018-01-11 NOTE — Progress Notes (Addendum)
Daily Session Note  Patient Details  Name: Sean [name] MRN: [id number] Date of Birth: 12-30-1950 Referring Provider:     Pulmonary Rehab Walk Test from 01/02/2018 in [place]  Referring Provider  Dr. Gwenette Greet      Encounter Date: 01/11/2018  Check In: Session Check In - 01/11/18 1357      Check-In   Location  [facility]    Staff Present  Su Hilt, MS, ACSM RCEP, Exercise Physiologist;Carlette Wilber Oliphant, RN, BSN    Supervising physician immediately available to respond to emergencies  [facility] immediately available    Physician(s)  Dr. Denton Brick    Medication changes reported      No    Fall or balance concerns reported     No    Tobacco Cessation  No Change    Warm-up and Cool-down  Performed as group-led instruction    Resistance Training Performed  Yes    VAD Patient?  No      Pain Assessment   Currently in Pain?  No/denies    Multiple Pain Sites  No       Capillary Blood Glucose: No results found for this or any previous visit (from the past 24 hour(s)).    Social History   Tobacco Use  Smoking Status Former Smoker  . Types: Cigarettes  . Last attempt to quit: 06/25/2013  . Years since quitting: 4.5  Smokeless Tobacco Former Systems developer  . Quit date: 06/25/1976    Goals Met:  Exercise tolerated well No report of cardiac concerns or symptoms Strength training completed today  Goals Unmet:  Not Applicable  Comments: Service time is from 1:30 to 3:30   Dr. Rush Farmer is Medical Director for Pulmonary Rehab at [facility].

## 2018-01-15 NOTE — Progress Notes (Signed)
Sean [name] 67 y.o. male   DOB: [birth year] MRN: [id number]          Nutrition 1. Chronic obstructive pulmonary disease, unspecified COPD type ([place])    Past Medical History:  Diagnosis Date  . Arthritis   . COPD (chronic obstructive pulmonary disease) ([place])   . Hypertension    Meds reviewed. MVI, vitamin D, Niacin, Thiamine  Ht: Ht Readings from Last 1 Encounters:  12/29/17 5' 5.75" (1.67 m)    Wt:  Wt Readings from Last 3 Encounters:  01/09/18 203 lb 4.2 oz (92.2 kg)  12/29/17 202 lb 2.6 oz (91.7 kg)  10/27/15 204 lb 5.9 oz (92.7 kg)    BMI: 33.06   Current tobacco use? No  Labs:  Lipid Panel  No results found for: CHOL, TRIG, HDL, CHOLHDL, VLDL, LDLCALC, LDLDIRECT  No results found for: HGBA1C  Nutrition Diagnosis ? Food-and nutrition-related knowledge deficit related to lack of exposure to information as related to diagnosis of pulmonary disease ? Obesity related to excessive energy intake as evidenced by a BMI of 33.06  Goal(s) 1. The pt will recognize symptoms that can interfere with adequate oral intake, such as shortness of breath, N/V, early satiety, fatigue, ability to secure and prepare food, taste and smell changes, chewing/swallowing difficulties, and/ or pain when eating. 2. Identify food quantities necessary to achieve wt loss of  -2# per week to a goal wt loss of 6-24 lb at graduation from pulmonary rehab.  Plan:  Pt to attend Pulmonary Nutrition class Will provide client-centered nutrition education as part of interdisciplinary care.   Monitor and evaluate progress toward nutrition goal with team.  Monitor and Evaluate progress toward nutrition goal with team.   Derek Mound, M.Ed, RD, LDN, CDE 01/15/2018 10:33 AM

## 2018-01-16 ENCOUNTER — Encounter (HOSPITAL_COMMUNITY)
Admission: RE | Admit: 2018-01-16 | Discharge: 2018-01-16 | Disposition: A | Discharge status: Home / Self Care | Payer: No Typology Code available for payment source | Source: Ambulatory Visit | Attending: Pulmonary Disease | Admitting: Pulmonary Disease

## 2018-01-16 DIAGNOSIS — Z79899 Other long term (current) drug therapy: Secondary | ICD-10-CM | POA: Diagnosis not present

## 2018-01-16 DIAGNOSIS — M1990 Unspecified osteoarthritis, unspecified site: Secondary | ICD-10-CM | POA: Diagnosis not present

## 2018-01-16 DIAGNOSIS — Z87891 Personal history of nicotine dependence: Secondary | ICD-10-CM | POA: Diagnosis not present

## 2018-01-16 DIAGNOSIS — Z7951 Long term (current) use of inhaled steroids: Secondary | ICD-10-CM | POA: Insufficient documentation

## 2018-01-16 DIAGNOSIS — I10 Essential (primary) hypertension: Secondary | ICD-10-CM | POA: Diagnosis not present

## 2018-01-16 DIAGNOSIS — J449 Chronic obstructive pulmonary disease, unspecified: Secondary | ICD-10-CM | POA: Diagnosis present

## 2018-01-16 NOTE — Progress Notes (Signed)
Daily Session Note  Patient Details  Name: Sean [name] MRN: [id number] Date of Birth: 06-29-1951 Referring Provider:     Pulmonary Rehab Walk Test from 01/02/2018 in [place]  Referring Provider  Dr. Gwenette Greet      Encounter Date: 01/16/2018  Check In: Session Check In - 01/16/18 1540      Check-In   Location  [facility]    Staff Present  Su Hilt, MS, ACSM RCEP, Exercise Physiologist;Carlette Wilber Oliphant, RN, BSN;Zakaria Fromer Ysidro Evert, RN;Olinty Rodanthe, MS, ACSM CEP, Exercise Physiologist    Supervising physician immediately available to respond to emergencies  [facility] immediately available    Physician(s)  Dr. Jonnie Finner    Medication changes reported      No    Fall or balance concerns reported     No    Tobacco Cessation  No Change    Warm-up and Cool-down  Performed as group-led instruction    Resistance Training Performed  Yes    VAD Patient?  No      Pain Assessment   Currently in Pain?  No/denies    Pain Score  0-No pain    Multiple Pain Sites  No       Capillary Blood Glucose: No results found for this or any previous visit (from the past 24 hour(s)).    Social History   Tobacco Use  Smoking Status Former Smoker  . Types: Cigarettes  . Last attempt to quit: 06/25/2013  . Years since quitting: 4.5  Smokeless Tobacco Former Systems developer  . Quit date: 06/25/1976    Goals Met:  Exercise tolerated well No report of cardiac concerns or symptoms Strength training completed today  Goals Unmet:  Not Applicable  Comments: Service time is from 1330 to 1510    Dr. Rush Farmer is Medical Director for Pulmonary Rehab at [facility].

## 2018-01-17 NOTE — Progress Notes (Signed)
Pulmonary Individual Treatment Plan  Patient Details  Name: Sean [name] MRN: [id number] Date of Birth: Aug 07, 1951 Referring Provider:     Pulmonary Rehab Walk Test from 01/02/2018 in [place]  Referring Provider  Dr. Gwenette Greet      Initial Encounter Date:    Pulmonary Rehab Walk Test from 01/02/2018 in [place]  Date  01/02/18  Referring Provider  Dr. Gwenette Greet      Visit Diagnosis: Chronic obstructive pulmonary disease, unspecified COPD type ([place])  Patient's Home Medications on Admission:   Current Outpatient Medications:  .  albuterol (PROVENTIL HFA;VENTOLIN HFA) 108 (90 BASE) MCG/ACT inhaler, Inhale 2 puffs into the lungs every 6 (six) hours as needed for wheezing or shortness of breath., Disp: , Rfl:  .  albuterol (PROVENTIL) (2.5 MG/3ML) 0.083% nebulizer solution, Take 2.5 mg by nebulization every 6 (six) hours as needed for wheezing or shortness of breath., Disp: , Rfl:  .  amLODipine (NORVASC) 10 MG tablet, Take 10 mg by mouth daily., Disp: , Rfl:  .  amLODipine (NORVASC) 10 MG tablet, Take 10 mg by mouth daily., Disp: , Rfl:  .  benazepril (LOTENSIN) 10 MG tablet, Take 10 mg by mouth daily. Take 1/2 daily, Disp: , Rfl:  .  budesonide-formoterol (SYMBICORT) 160-4.5 MCG/ACT inhaler, Inhale 2 puffs into the lungs 2 (two) times daily. Rinse mouth well with water after each use, Disp: , Rfl:  .  cholecalciferol (VITAMIN D) 1000 UNITS tablet, Take 1,000 Units by mouth 2 (two) times daily., Disp: , Rfl:  .  hydrocortisone 2.5 % cream, Apply topically 3 (three) times daily. rectally, Disp: , Rfl:  .  meloxicam (MOBIC) 15 MG tablet, Take 15 mg by mouth daily., Disp: , Rfl:  .  metoprolol (TOPROL-XL) 200 MG 24 hr tablet, Take 200 mg by mouth daily., Disp: , Rfl:  .  multivitamin-lutein (OCUVITE-LUTEIN) CAPS capsule, Take 1 capsule by mouth daily., Disp: , Rfl:  .  niacin 100 MG tablet, Take 100 mg by mouth at bedtime., Disp: ,  Rfl:  .  ondansetron (ZOFRAN) 8 MG tablet, Take by mouth every 8 (eight) hours as needed for nausea or vomiting., Disp: , Rfl:  .  pantoprazole (PROTONIX) 40 MG tablet, Take 40 mg by mouth 2 (two) times daily., Disp: , Rfl:  .  thiamine 100 MG tablet, Take 100 mg by mouth daily., Disp: , Rfl:  .  tiotropium (SPIRIVA) 18 MCG inhalation capsule, Place 18 mcg into inhaler and inhale daily., Disp: , Rfl:  .  urea (CARMOL) 20 % cream, Apply topically 3 (three) times daily., Disp: , Rfl:   Past Medical History: Past Medical History:  Diagnosis Date  . Arthritis   . COPD (chronic obstructive pulmonary disease) ([place])   . Hypertension     Tobacco Use: Social History   Tobacco Use  Smoking Status Former Smoker  . Types: Cigarettes  . Last attempt to quit: 06/25/2013  . Years since quitting: 4.5  Smokeless Tobacco Former Systems developer  . Quit date: 06/25/1976    Labs: Recent Review Flowsheet Data    There is no flowsheet data to display.      Capillary Blood Glucose: No results found for: GLUCAP   Pulmonary Assessment Scores: Pulmonary Assessment Scores    Row Name 01/02/18 1633         ADL UCSD   ADL Phase  Entry       mMRC Score   mMRC Score  3  Pulmonary Function Assessment:   Exercise Target Goals:    Exercise Program Goal: Individual exercise prescription set using results from initial 6 min walk test and THRR while considering  patient's activity barriers and safety.    Exercise Prescription Goal: Initial exercise prescription builds to 30-45 minutes a day of aerobic activity, 2-3 days per week.  Home exercise guidelines will be given to patient during program as part of exercise prescription that the participant will acknowledge.  Activity Barriers & Risk Stratification: Activity Barriers & Cardiac Risk Stratification - 12/29/17 1029      Activity Barriers & Cardiac Risk Stratification   Activity Barriers  Joint Problems;Arthritis bilateral knee pain        6 Minute Walk: 6 Minute Walk    Row Name 01/02/18 1635         6 Minute Walk   Phase  Initial     Distance  1443 feet     Walk Time  6 minutes     # of Rest Breaks  0     MPH  2.73     METS  3.07     RPE  13     Perceived Dyspnea   2     Symptoms  Yes (comment)     Comments  LEFT CALF BURNING     Resting HR  76 bpm     Resting BP  130/80     Resting Oxygen Saturation   92 %     Exercise Oxygen Saturation  during 6 min walk  89 %     Max Ex. HR  114 bpm     Max Ex. BP  152/84       Interval HR   1 Minute HR  88     2 Minute HR  90     3 Minute HR  82     4 Minute HR  87     5 Minute HR  88     6 Minute HR  114     2 Minute Post HR  82     Interval Heart Rate?  Yes       Interval Oxygen   Interval Oxygen?  Yes     Baseline Oxygen Saturation %  92 %     1 Minute Oxygen Saturation %  93 %     1 Minute Liters of Oxygen  0 L     2 Minute Oxygen Saturation %  89 %     2 Minute Liters of Oxygen  0 L     3 Minute Oxygen Saturation %  89 %     3 Minute Liters of Oxygen  0 L     4 Minute Oxygen Saturation %  89 %     4 Minute Liters of Oxygen  0 L     5 Minute Oxygen Saturation %  90 %     5 Minute Liters of Oxygen  0 L     6 Minute Oxygen Saturation %  89 %     6 Minute Liters of Oxygen  0 L     2 Minute Post Oxygen Saturation %  95 %     2 Minute Post Liters of Oxygen  0 L        Oxygen Initial Assessment: Oxygen Initial Assessment - 12/29/17 1004      Home Oxygen   Home Oxygen Device  None    Sleep Oxygen Prescription  None  Home Exercise Oxygen Prescription  None    Home at Rest Exercise Oxygen Prescription  None      Intervention   Short Term Goals  To learn and exhibit compliance with exercise, home and travel O2 prescription;To learn and understand importance of maintaining oxygen saturations>88%;To learn and demonstrate proper use of respiratory medications;To learn and understand importance of monitoring SPO2 with pulse oximeter and demonstrate  accurate use of the pulse oximeter.;To learn and demonstrate proper pursed lip breathing techniques or other breathing techniques.    Long  Term Goals  Exhibits compliance with exercise, home and travel O2 prescription;Verbalizes importance of monitoring SPO2 with pulse oximeter and return demonstration;Maintenance of O2 saturations>88%;Exhibits proper breathing techniques, such as pursed lip breathing or other method taught during program session;Compliance with respiratory medication;Demonstrates proper use of MDI's       Oxygen Re-Evaluation: Oxygen Re-Evaluation    Row Name 01/02/18 1632 01/16/18 0653           Program Oxygen Prescription   Program Oxygen Prescription  -  None        Home Oxygen   Home Oxygen Device  -  None      Sleep Oxygen Prescription  -  None      Home Exercise Oxygen Prescription  None  None      Home at Rest Exercise Oxygen Prescription  None  None         Oxygen Discharge (Final Oxygen Re-Evaluation): Oxygen Re-Evaluation - 01/16/18 0653      Program Oxygen Prescription   Program Oxygen Prescription  None      Home Oxygen   Home Oxygen Device  None    Sleep Oxygen Prescription  None    Home Exercise Oxygen Prescription  None    Home at Rest Exercise Oxygen Prescription  None       Initial Exercise Prescription: Initial Exercise Prescription - 01/02/18 1600      Date of Initial Exercise RX and Referring Provider   Date  01/02/18    Referring Provider  Dr. Gwenette Greet      Oxygen   Oxygen  --      Bike   Level  0.4    Minutes  17      NuStep   Level  2    SPM  80    Minutes  17    METs  1.5      Track   Laps  10    Minutes  17      Prescription Details   Frequency (times per week)  2    Duration  Progress to 45 minutes of aerobic exercise without signs/symptoms of physical distress      Intensity   THRR 40-80% of Max Heartrate  62-124    Ratings of Perceived Exertion  11-13    Perceived Dyspnea  0-4      Progression    Progression  Continue progressive overload as per policy without signs/symptoms or physical distress.      Resistance Training   Training Prescription  Yes    Weight  blue bands    Reps  10-15       Perform Capillary Blood Glucose checks as needed.  Exercise Prescription Changes: Exercise Prescription Changes    Row Name 01/09/18 1600             Response to Exercise   Blood Pressure (Admit)  146/82       Blood Pressure (Exercise)  138/80  Blood Pressure (Exit)  124/72       Heart Rate (Admit)  79 bpm       Heart Rate (Exercise)  99 bpm       Heart Rate (Exit)  71 bpm       Oxygen Saturation (Admit)  93 %       Oxygen Saturation (Exercise)  89 %       Oxygen Saturation (Exit)  94 %       Rating of Perceived Exertion (Exercise)  17       Perceived Dyspnea (Exercise)  1       Symptoms  none       Comments  pt was oriented to exercise equipment today       Duration  Progress to 45 minutes of aerobic exercise without signs/symptoms of physical distress       Intensity  THRR unchanged         Progression   Progression  Continue to progress workloads to maintain intensity without signs/symptoms of physical distress.         Resistance Training   Training Prescription  Yes       Weight  blue bands       Reps  10-15 pt does 10 minutes of strength training         Bike   Level  0.6       Minutes  17         NuStep   Level  2       SPM  80       Minutes  17       METs  2.2         Track   Laps  16       Minutes  17          Exercise Comments:   Exercise Goals and Review: Exercise Goals    Row Name 12/29/17 1025             Exercise Goals   Increase Physical Activity  Yes       Intervention  Provide advice, education, support and counseling about physical activity/exercise needs.;Develop an individualized exercise prescription for aerobic and resistive training based on initial evaluation findings, risk stratification, comorbidities and participant's  personal goals.       Expected Outcomes  Short Term: Attend rehab on a regular basis to increase amount of physical activity.;Long Term: Add in home exercise to make exercise part of routine and to increase amount of physical activity.;Long Term: Exercising regularly at least 3-5 days a week.       Increase Strength and Stamina  Yes       Intervention  Provide advice, education, support and counseling about physical activity/exercise needs.;Develop an individualized exercise prescription for aerobic and resistive training based on initial evaluation findings, risk stratification, comorbidities and participant's personal goals.       Expected Outcomes  Short Term: Increase workloads from initial exercise prescription for resistance, speed, and METs.;Short Term: Perform resistance training exercises routinely during rehab and add in resistance training at home;Long Term: Improve cardiorespiratory fitness, muscular endurance and strength as measured by increased METs and functional capacity (6MWT)       Able to understand and use rate of perceived exertion (RPE) scale  Yes       Intervention  Provide education and explanation on how to use RPE scale       Expected Outcomes  Short Term: Able  to use RPE daily in rehab to express subjective intensity level;Long Term:  Able to use RPE to guide intensity level when exercising independently       Able to understand and use Dyspnea scale  Yes       Intervention  Provide education and explanation on how to use Dyspnea scale       Expected Outcomes  Short Term: Able to use Dyspnea scale daily in rehab to express subjective sense of shortness of breath during exertion;Long Term: Able to use Dyspnea scale to guide intensity level when exercising independently       Knowledge and understanding of Target Heart Rate Range (THRR)  Yes       Intervention  Provide education and explanation of THRR including how the numbers were predicted and where they are located for  reference       Expected Outcomes  Short Term: Able to state/look up THRR;Long Term: Able to use THRR to govern intensity when exercising independently;Short Term: Able to use daily as guideline for intensity in rehab       Understanding of Exercise Prescription  Yes       Intervention  Provide education, explanation, and written materials on patient's individual exercise prescription       Expected Outcomes  Short Term: Able to explain program exercise prescription;Long Term: Able to explain home exercise prescription to exercise independently          Exercise Goals Re-Evaluation : Exercise Goals Re-Evaluation    Row Name 01/16/18 0653 01/16/18 0656           Exercise Goal Re-Evaluation   Exercise Goals Review  Increase Strength and Stamina;Increase Physical Activity;Able to understand and use rate of perceived exertion (RPE) scale;Knowledge and understanding of Target Heart Rate Range (THRR);Understanding of Exercise Prescription;Able to understand and use Dyspnea scale  -      Comments  Patient has only attended two rehab sessions. Will cont. to monitor and progress as able.   -      Expected Outcomes  Through e  Through exercise at rehab and at home, the patient will decrease shortness of breath with daily activities and feel confident in carrying out an exercise regime at home.          Discharge Exercise Prescription (Final Exercise Prescription Changes): Exercise Prescription Changes - 01/09/18 1600      Response to Exercise   Blood Pressure (Admit)  146/82    Blood Pressure (Exercise)  138/80    Blood Pressure (Exit)  124/72    Heart Rate (Admit)  79 bpm    Heart Rate (Exercise)  99 bpm    Heart Rate (Exit)  71 bpm    Oxygen Saturation (Admit)  93 %    Oxygen Saturation (Exercise)  89 %    Oxygen Saturation (Exit)  94 %    Rating of Perceived Exertion (Exercise)  17    Perceived Dyspnea (Exercise)  1    Symptoms  none    Comments  pt was oriented to exercise equipment  today    Duration  Progress to 45 minutes of aerobic exercise without signs/symptoms of physical distress    Intensity  THRR unchanged      Progression   Progression  Continue to progress workloads to maintain intensity without signs/symptoms of physical distress.      Resistance Training   Training Prescription  Yes    Weight  blue bands    Reps  10-15 pt  does 10 minutes of strength training      Bike   Level  0.6    Minutes  17      NuStep   Level  2    SPM  80    Minutes  17    METs  2.2      Track   Laps  16    Minutes  17       Nutrition:  Target Goals: Understanding of nutrition guidelines, daily intake of sodium 1500mg , cholesterol 200mg , calories 30% from fat and 7% or less from saturated fats, daily to have 5 or more servings of fruits and vegetables.  Biometrics: Pre Biometrics - 01/09/18 1601      Pre Biometrics   Weight  203 lb 4.2 oz (92.2 kg)    BMI (Calculated)  33.06        Nutrition Therapy Plan and Nutrition Goals: Nutrition Therapy & Goals - 01/15/18 1037      Nutrition Therapy   Diet  General, healthful      Personal Nutrition Goals   Nutrition Goal  Wt loss of 1-2 lb/week to a wt loss goal of 6-24 lb at graduation from Pulmonary Rehab    Personal Goal #2  The pt will recognize symptoms that can interfere with adequate oral intake, such as shortness of breath, N/V, early satiety, fatigue, ability to secure and prepare food, taste and smell changes, chewing/swallowing difficulties, and/ or pain when eating.      Intervention Plan   Intervention  Prescribe, educate and counsel regarding individualized specific dietary modifications aiming towards targeted core components such as weight, hypertension, lipid management, diabetes, heart failure and other comorbidities.    Expected Outcomes  Short Term Goal: Understand basic principles of dietary content, such as calories, fat, sodium, cholesterol and nutrients.;Long Term Goal: Adherence to  prescribed nutrition plan.       Nutrition Assessments: Nutrition Assessments - 01/15/18 1037      Rate Your Plate Scores   Pre Score  53       Nutrition Goals Re-Evaluation:   Nutrition Goals Discharge (Final Nutrition Goals Re-Evaluation):   Psychosocial: Target Goals: Acknowledge presence or absence of significant depression and/or stress, maximize coping skills, provide positive support system. Participant is able to verbalize types and ability to use techniques and skills needed for reducing stress and depression.  Initial Review & Psychosocial Screening: Initial Psych Review & Screening - 12/29/17 1027      Initial Review   Current issues with  None Identified      Family Dynamics   Good Support System?  Yes    Comments  recently loss his dog of 5 years      Barriers   Psychosocial barriers to participate in program  The patient should benefit from training in stress management and relaxation.      Screening Interventions   Interventions  Encouraged to exercise    Expected Outcomes  Long Term Goal: Stressors or current issues are controlled or eliminated.;Short Term goal: Identification and review with participant of any Quality of Life or Depression concerns found by scoring the questionnaire.       Quality of Life Scores:  Scores of 19 and below usually indicate a poorer quality of life in these areas.  A difference of  2-3 points is a clinically meaningful difference.  A difference of 2-3 points in the total score of the Quality of Life Index has been associated with significant improvement in overall  quality of life, self-image, physical symptoms, and general health in studies assessing change in quality of life.   PHQ-9: Recent Review Flowsheet Data    Depression screen [facility] 2/9 12/29/2017 10/29/2015 06/29/2015   Decreased Interest 0 0 1   Down, Depressed, Hopeless 0 0 0   PHQ - 2 Score 0 0 1   Altered sleeping 0 - -   Tired, decreased energy 1 - -    Change in appetite 0 - -   Feeling bad or failure about yourself  0 - -   Trouble concentrating 0 - -   Moving slowly or fidgety/restless 0 - -   Suicidal thoughts 0 - -   PHQ-9 Score 1 - -   Difficult doing work/chores Somewhat difficult - -     Interpretation of Total Score  Total Score Depression Severity:  1-4 = Minimal depression, 5-9 = Mild depression, 10-14 = Moderate depression, 15-19 = Moderately severe depression, 20-27 = Severe depression   Psychosocial Evaluation and Intervention: Psychosocial Evaluation - 01/17/18 1225      Psychosocial Evaluation & Interventions   Interventions  Stress management education;Relaxation education;Encouraged to exercise with the program and follow exercise prescription    Comments  Pt feels supported in his rehab participation.  Pt misses his dog that he lost 5 years ago.    Expected Outcomes  Pt will be free of any psychosocial needs    Continue Psychosocial Services   Follow up required by staff       Psychosocial Re-Evaluation:   Psychosocial Discharge (Final Psychosocial Re-Evaluation):   Education: Education Goals: Education classes will be provided on a weekly basis, covering required topics. Participant will state understanding/return demonstration of topics presented.  Learning Barriers/Preferences: Learning Barriers/Preferences - 12/29/17 1514      Learning Barriers/Preferences   Learning Barriers  None    Learning Preferences  Verbal Instruction;Individual Instruction;Group Instruction;Computer/Internet;Skilled Demonstration;Video;Written Material;Pictoral       Education Topics: Risk Factor Reduction:  -Group instruction that is supported by a PowerPoint presentation. Instructor discusses the definition of a risk factor, different risk factors for pulmonary disease, and how the heart and lungs work together.     Nutrition for Pulmonary Patient:  -Group instruction provided by PowerPoint slides, verbal discussion,  and written materials to support subject matter. The instructor gives an explanation and review of healthy diet recommendations, which includes a discussion on weight management, recommendations for fruit and vegetable consumption, as well as protein, fluid, caffeine, fiber, sodium, sugar, and alcohol. Tips for eating when patients are short of breath are discussed.   Pursed Lip Breathing:  -Group instruction that is supported by demonstration and informational handouts. Instructor discusses the benefits of pursed lip and diaphragmatic breathing and detailed demonstration on how to preform both.     Oxygen Safety:  -Group instruction provided by PowerPoint, verbal discussion, and written material to support subject matter. There is an overview of "What is Oxygen" and "Why do we need it".  Instructor also reviews how to create a safe environment for oxygen use, the importance of using oxygen as prescribed, and the risks of noncompliance. There is a brief discussion on traveling with oxygen and resources the patient may utilize.   Oxygen Equipment:  -Group instruction provided by [facility] Staff utilizing handouts, written materials, and equipment demonstrations.   Signs and Symptoms:  -Group instruction provided by written material and verbal discussion to support subject matter. Warning signs and symptoms of infection, stroke, and heart attack are  reviewed and when to call the physician/911 reinforced. Tips for preventing the spread of infection discussed.   Advanced Directives:  -Group instruction provided by verbal instruction and written material to support subject matter. Instructor reviews Advanced Directive laws and proper instruction for filling out document.   Pulmonary Video:  -Group video education that reviews the importance of medication and oxygen compliance, exercise, good nutrition, pulmonary hygiene, and pursed lip and diaphragmatic breathing for the pulmonary  patient.   Exercise for the Pulmonary Patient:  -Group instruction that is supported by a PowerPoint presentation. Instructor discusses benefits of exercise, core components of exercise, frequency, duration, and intensity of an exercise routine, importance of utilizing pulse oximetry during exercise, safety while exercising, and options of places to exercise outside of rehab.     Pulmonary Medications:  -Verbally interactive group education provided by instructor with focus on inhaled medications and proper administration.   Anatomy and Physiology of the Respiratory System and Intimacy:  -Group instruction provided by PowerPoint, verbal discussion, and written material to support subject matter. Instructor reviews respiratory cycle and anatomical components of the respiratory system and their functions. Instructor also reviews differences in obstructive and restrictive respiratory diseases with examples of each. Intimacy, Sex, and Sexuality differences are reviewed with a discussion on how relationships can change when diagnosed with pulmonary disease. Common sexual concerns are reviewed.   PULMONARY REHAB CHRONIC OBSTRUCTIVE PULMONARY DISEASE from 01/11/2018 in [place]  Date  01/11/18  Educator  rn  Instruction Review Code  2- Demonstrated Understanding      MD DAY -A group question and answer session with a medical doctor that allows participants to ask questions that relate to their pulmonary disease state.   OTHER EDUCATION -Group or individual verbal, written, or video instructions that support the educational goals of the pulmonary rehab program.   Holiday Eating Survival Tips:  -Group instruction provided by PowerPoint slides, verbal discussion, and written materials to support subject matter. The instructor gives patients tips, tricks, and techniques to help them not only survive but enjoy the holidays despite the onslaught of food that  accompanies the holidays.   Knowledge Questionnaire Score:   Core Components/Risk Factors/Patient Goals at Admission: Personal Goals and Risk Factors at Admission - 12/29/17 1024      Core Components/Risk Factors/Patient Goals on Admission    Weight Management  Weight Loss    Improve shortness of breath with ADL's  Yes    Intervention  Provide education, individualized exercise plan and daily activity instruction to help decrease symptoms of SOB with activities of daily living.    Expected Outcomes  Short Term: Improve cardiorespiratory fitness to achieve a reduction of symptoms when performing ADLs    Hypertension  Yes    Intervention  Provide education on lifestyle modifcations including regular physical activity/exercise, weight management, moderate sodium restriction and increased consumption of fresh fruit, vegetables, and low fat dairy, alcohol moderation, and smoking cessation.;Monitor prescription use compliance.    Expected Outcomes  Short Term: Continued assessment and intervention until BP is < 140/58mm HG in hypertensive participants. < 130/25mm HG in hypertensive participants with diabetes, heart failure or chronic kidney disease.;Long Term: Maintenance of blood pressure at goal levels.       Core Components/Risk Factors/Patient Goals Review:  Goals and Risk Factor Review    Row Name 01/17/18 1220             Core Components/Risk Factors/Patient Goals Review   Personal Goals Review  Weight Management/Obesity;Improve shortness of breath with ADL's;Hypertension       Review  Pt is off to a good start in pulmonary rehab.  Pt weight remains the same however pt has only attended 3 exercise sessions.  Pt is learning PLB and diaphragmatic breathing techniques.  Pt needs cues but is able to demonstrate appropriately.  Pt o2 sat do hoover in the upper 80's when ambulating on the track. Pt with continued elevation in BP Pre exercise despite compliance with his medications amd diet.  However his BP post exercise is generally lower than pre.  I anticipate that with continued participation in the pulmonary rehab program  toward meating his goals.            Core Components/Risk Factors/Patient Goals at Discharge (Final Review):  Goals and Risk Factor Review - 01/17/18 1220      Core Components/Risk Factors/Patient Goals Review   Personal Goals Review  Weight Management/Obesity;Improve shortness of breath with ADL's;Hypertension    Review  Pt is off to a good start in pulmonary rehab.  Pt weight remains the same however pt has only attended 3 exercise sessions.  Pt is learning PLB and diaphragmatic breathing techniques.  Pt needs cues but is able to demonstrate appropriately.  Pt o2 sat do hoover in the upper 80's when ambulating on the track. Pt with continued elevation in BP Pre exercise despite compliance with his medications amd diet. However his BP post exercise is generally lower than pre.  I anticipate that with continued participation in the pulmonary rehab program  toward meating his goals.         ITP Comments: ITP Comments    Row Name 12/29/17 1004 01/17/18 1220         ITP Comments  Dr. Jennet Maduro, Medical Director  Dr. Jennet Maduro, Medical Director         Comments: Pt has completed 3 exercise sessions. Cherre Huger, BSN Cardiac and Training and development officer

## 2018-01-18 ENCOUNTER — Encounter (HOSPITAL_COMMUNITY)
Admission: RE | Admit: 2018-01-18 | Discharge: 2018-01-18 | Disposition: A | Discharge status: Home / Self Care | Payer: No Typology Code available for payment source | Source: Ambulatory Visit | Attending: Pulmonary Disease | Admitting: Pulmonary Disease

## 2018-01-18 DIAGNOSIS — J449 Chronic obstructive pulmonary disease, unspecified: Secondary | ICD-10-CM

## 2018-01-18 NOTE — Progress Notes (Signed)
Daily Session Note  Patient Details  Name: Sean [name] MRN: [id number] Date of Birth: 1950/12/28 Referring Provider:     Pulmonary Rehab Walk Test from 01/02/2018 in [place]  Referring Provider  Dr. Gwenette Greet      Encounter Date: 01/18/2018  Check In: Session Check In - 01/18/18 1351      Check-In   Location  [facility]    Staff Present  Rodney Langton, RN;Carlette Wilber Oliphant, RN, BSN;Ramon Dredge, RN, MHA;Molly DiVincenzo, MS, ACSM RCEP, Exercise Physiologist    Supervising physician immediately available to respond to emergencies  [facility] immediately available    Physician(s)  Dr. Tana Coast    Medication changes reported      No    Fall or balance concerns reported     No    Tobacco Cessation  No Change    Warm-up and Cool-down  Performed as group-led instruction    Resistance Training Performed  Yes    VAD Patient?  No      Pain Assessment   Currently in Pain?  No/denies    Multiple Pain Sites  No       Capillary Blood Glucose: No results found for this or any previous visit (from the past 24 hour(s)).    Social History   Tobacco Use  Smoking Status Former Smoker  . Types: Cigarettes  . Last attempt to quit: 06/25/2013  . Years since quitting: 4.5  Smokeless Tobacco Former Systems developer  . Quit date: 06/25/1976    Goals Met:  Exercise tolerated well No report of cardiac concerns or symptoms Strength training completed today  Goals Unmet:  Not Applicable  Comments: Service time is from 1330 to 1505    Dr. Rush Farmer is Medical Director for Pulmonary Rehab at [facility].

## 2018-01-23 ENCOUNTER — Encounter (HOSPITAL_COMMUNITY)
Admission: RE | Admit: 2018-01-23 | Discharge: 2018-01-23 | Disposition: A | Discharge status: Home / Self Care | Payer: No Typology Code available for payment source | Source: Ambulatory Visit | Attending: Pulmonary Disease | Admitting: Pulmonary Disease

## 2018-01-23 VITALS — Wt 202.8 lb

## 2018-01-23 DIAGNOSIS — J449 Chronic obstructive pulmonary disease, unspecified: Secondary | ICD-10-CM

## 2018-01-23 NOTE — Progress Notes (Signed)
I have reviewed a Home Exercise Prescription with [name] . Sean [name] is not currently exercising at home.  The patient was advised to walk 2 days a week for 30 minutes.  Hjalmer and I discussed how to progress their exercise prescription.  The patient stated that their goals were to lose weight (40 pounds), become more physically fit, increase exercise to help mental status.  The patient stated that they understand the exercise prescription.  We reviewed exercise guidelines, target heart rate during exercise, RPE Scale, weather conditions, NTG use, endpoints for exercise, warmup and cool down.  Patient is encouraged to come to me with any questions. I will continue to follow up with the patient to assist them with progression and safety.

## 2018-01-23 NOTE — Progress Notes (Signed)
Daily Session Note  Patient Details  Name: Sean [name] MRN: [id number] Date of Birth: 1951-07-19 Referring Provider:     Pulmonary Rehab Walk Test from 01/02/2018 in [place]  Referring Provider  Dr. Gwenette Greet      Encounter Date: 01/23/2018  Check In: Session Check In - 01/23/18 1330      Check-In   Location  [facility]    Staff Present  Rosebud Poles, RN, BSN;Molly DiVincenzo, MS, ACSM RCEP, Exercise Physiologist;Lisa Ysidro Evert, Felipe Drone, RN, MHA;Olinty Celesta Aver, MS, ACSM CEP, Exercise Physiologist;Carlette Wilber Oliphant, RN, BSN    Supervising physician immediately available to respond to emergencies  [facility] immediately available    Physician(s)  Dr. Tana Coast    Medication changes reported      No    Fall or balance concerns reported     No    Tobacco Cessation  No Change    Warm-up and Cool-down  Performed as group-led instruction    Resistance Training Performed  Yes    VAD Patient?  No      Pain Assessment   Currently in Pain?  No/denies    Multiple Pain Sites  No       Capillary Blood Glucose: No results found for this or any previous visit (from the past 24 hour(s)).  Exercise Prescription Changes - 01/23/18 1500      Response to Exercise   Blood Pressure (Admit)  140/80    Blood Pressure (Exercise)  140/84    Blood Pressure (Exit)  142/70    Heart Rate (Admit)  76 bpm    Heart Rate (Exercise)  90 bpm    Heart Rate (Exit)  65 bpm    Oxygen Saturation (Admit)  91 %    Oxygen Saturation (Exercise)  89 %    Oxygen Saturation (Exit)  93 %    Rating of Perceived Exertion (Exercise)  11    Perceived Dyspnea (Exercise)  1    Duration  Progress to 45 minutes of aerobic exercise without signs/symptoms of physical distress    Intensity  THRR unchanged      Progression   Progression  Continue to progress workloads to maintain intensity without signs/symptoms of physical distress.      Resistance Training   Training Prescription  Yes    Weight  blue bands    Reps  10-15 pt does 10 minutes of strength training    Time  10 Minutes      Interval Training   Interval Training  No      Bike   Level  0.8    Minutes  17      NuStep   Level  2.5    SPM  80    Minutes  17    METs  2.5      Track   Laps  13    Minutes  17       Social History   Tobacco Use  Smoking Status Former Smoker  . Types: Cigarettes  . Last attempt to quit: 06/25/2013  . Years since quitting: 4.5  Smokeless Tobacco Former Systems developer  . Quit date: 06/25/1976    Goals Met:  Exercise tolerated well Strength training completed today  Goals Unmet:  Not Applicable  Comments: Service time is from 1330 to 1500    Dr. Rush Farmer is Medical Director for Pulmonary Rehab at [facility]

## 2018-01-23 NOTE — Addendum Note (Signed)
Encounter addended by: Lance Morin, RN on: 01/23/2018 9:38 AM  Actions taken: Visit Navigator Flowsheet section accepted

## 2018-01-25 ENCOUNTER — Encounter (HOSPITAL_COMMUNITY)
Admission: RE | Admit: 2018-01-25 | Discharge: 2018-01-25 | Disposition: A | Discharge status: Home / Self Care | Payer: No Typology Code available for payment source | Source: Ambulatory Visit | Attending: Pulmonary Disease | Admitting: Pulmonary Disease

## 2018-01-25 DIAGNOSIS — J449 Chronic obstructive pulmonary disease, unspecified: Secondary | ICD-10-CM

## 2018-01-25 NOTE — Progress Notes (Signed)
Daily Session Note  Patient Details  Name: Sean [name] MRN: [id number] Date of Birth: 06/15/1951 Referring Provider:     Pulmonary Rehab Walk Test from 01/02/2018 in [place]  Referring Provider  Dr. Gwenette Greet      Encounter Date: 01/25/2018  Check In: Session Check In - 01/25/18 1330      Check-In   Location  [facility]    Staff Present  Rosebud Poles, RN, BSN;Molly DiVincenzo, MS, ACSM RCEP, Exercise Physiologist;Lisa Ysidro Evert, RN;Carlette Wilber Oliphant, RN, BSN;Ramon Dredge, RN, [facility]    Supervising physician immediately available to respond to emergencies  [facility] immediately available    Physician(s)  Dr. Broadus John    Medication changes reported      No    Fall or balance concerns reported     No    Tobacco Cessation  No Change    Warm-up and Cool-down  Performed as group-led instruction    Resistance Training Performed  Yes    VAD Patient?  No      Pain Assessment   Currently in Pain?  No/denies    Multiple Pain Sites  No       Capillary Blood Glucose: No results found for this or any previous visit (from the past 24 hour(s)).    Social History   Tobacco Use  Smoking Status Former Smoker  . Types: Cigarettes  . Last attempt to quit: 06/25/2013  . Years since quitting: 4.5  Smokeless Tobacco Former Systems developer  . Quit date: 06/25/1976    Goals Met:  Exercise tolerated well Strength training completed today  Goals Unmet:  Not Applicable  Comments: Service time is from 1330 to 1515    Dr. Rush Farmer is Medical Director for Pulmonary Rehab at [facility].

## 2018-01-30 ENCOUNTER — Encounter (HOSPITAL_COMMUNITY): Payer: No Typology Code available for payment source

## 2018-02-01 ENCOUNTER — Encounter (HOSPITAL_COMMUNITY)
Admission: RE | Admit: 2018-02-01 | Discharge: 2018-02-01 | Disposition: A | Discharge status: Home / Self Care | Payer: No Typology Code available for payment source | Source: Ambulatory Visit | Attending: Pulmonary Disease | Admitting: Pulmonary Disease

## 2018-02-01 VITALS — Wt 202.8 lb

## 2018-02-01 DIAGNOSIS — J449 Chronic obstructive pulmonary disease, unspecified: Secondary | ICD-10-CM | POA: Diagnosis not present

## 2018-02-01 NOTE — Progress Notes (Signed)
Daily Session Note  Patient Details  Name: Sean [name] MRN: [id number] Date of Birth: 05/04/1951 Referring Provider:     Pulmonary Rehab Walk Test from 01/02/2018 in [facility]  Referring Provider  Dr. Clance      Encounter Date: 02/01/2018  Check In: Session Check In - 02/01/18 1330      Check-In   Location  [facility]    Staff Present  Joan Behrens, RN, BSN;Molly DiVincenzo, MS, ACSM RCEP, Exercise Physiologist;Lisa Hughes, RN    Supervising physician immediately available to respond to emergencies  [facility] immediately available    Physician(s)  Dr. Purohit    Medication changes reported      No    Fall or balance concerns reported     No    Tobacco Cessation  No Change    Warm-up and Cool-down  Performed as group-led instruction    Resistance Training Performed  Yes    VAD Patient?  No      Pain Assessment   Currently in Pain?  No/denies    Multiple Pain Sites  No       Capillary Blood Glucose: No results found for this or any previous visit (from the past 24 hour(s)).    Social History   Tobacco Use  Smoking Status Former Smoker  . Types: Cigarettes  . Last attempt to quit: 06/25/2013  . Years since quitting: 4.6  Smokeless Tobacco Former User  . Quit date: 06/25/1976    Goals Met:  Exercise tolerated well Strength training completed today  Goals Unmet:  Not Applicable  Comments: Service time is from 1330 to 1530     Dr. Wesam G. Yacoub is Medical Director for Pulmonary Rehab at [facility]. 

## 2018-02-06 ENCOUNTER — Telehealth (HOSPITAL_COMMUNITY): Payer: Self-pay | Admitting: *Deleted

## 2018-02-06 ENCOUNTER — Encounter (HOSPITAL_COMMUNITY)
Admission: RE | Admit: 2018-02-06 | Discharge: 2018-02-06 | Disposition: A | Discharge status: Home / Self Care | Payer: No Typology Code available for payment source | Source: Ambulatory Visit | Attending: Cardiology | Admitting: Cardiology

## 2018-02-06 VITALS — Wt 201.7 lb

## 2018-02-06 DIAGNOSIS — J449 Chronic obstructive pulmonary disease, unspecified: Secondary | ICD-10-CM

## 2018-02-06 NOTE — Progress Notes (Signed)
Daily Session Note  Patient Details  Name: Sean [name] MRN: [id number] Date of Birth: [birth year] Referring Provider:     Pulmonary Rehab Walk Test from 01/02/2018 in [place]  Referring Provider  Dr. Gwenette Greet      Encounter Date: 02/06/2018  Check In: Session Check In - 02/06/18 1330      Check-In   Location  [facility]    Staff Present  Rosebud Poles, RN, BSN;Molly DiVincenzo, MS, ACSM RCEP, Exercise Physiologist;Lisa Ysidro Evert, Felipe Drone, RN, [facility]    Supervising physician immediately available to respond to emergencies  [facility] immediately available    Physician(s)  Dr. Herbert Moors    Medication changes reported      No    Fall or balance concerns reported     No    Tobacco Cessation  No Change    Warm-up and Cool-down  Performed as group-led instruction    Resistance Training Performed  Yes    VAD Patient?  No    PAD/SET Patient?  No      Pain Assessment   Currently in Pain?  No/denies    Multiple Pain Sites  No       Capillary Blood Glucose: No results found for this or any previous visit (from the past 24 hour(s)).  Exercise Prescription Changes - 02/06/18 1500      Response to Exercise   Blood Pressure (Admit)  124/62    Blood Pressure (Exercise)  140/80    Blood Pressure (Exit)  116/60    Heart Rate (Admit)  72 bpm    Heart Rate (Exercise)  93 bpm    Heart Rate (Exit)  66 bpm    Oxygen Saturation (Admit)  89 %    Oxygen Saturation (Exercise)  91 %    Oxygen Saturation (Exit)  92 %    Rating of Perceived Exertion (Exercise)  12    Perceived Dyspnea (Exercise)  2    Duration  Progress to 45 minutes of aerobic exercise without signs/symptoms of physical distress    Intensity  THRR unchanged      Progression   Progression  Continue to progress workloads to maintain intensity without signs/symptoms of physical distress.      Resistance Training   Training Prescription  Yes    Weight  blue bands     Reps  10-15 pt does 10 minutes of strength training    Time  10 Minutes      Interval Training   Interval Training  No      Bike   Level  1    Minutes  17      NuStep   Level  4    SPM  80    Minutes  17    METs  2.9      Track   Laps  16    Minutes  17       Social History   Tobacco Use  Smoking Status Former Smoker  . Types: Cigarettes  . Last attempt to quit: 06/25/2013  . Years since quitting: 4.6  Smokeless Tobacco Former Systems developer  . Quit date: 06/25/1976    Goals Met:  Exercise tolerated well Strength training completed today  Goals Unmet:  Not Applicable  Comments: Service time is from 1330 to 1520    Dr. Rush Farmer is Medical Director for Pulmonary Rehab at [facility].

## 2018-02-08 ENCOUNTER — Encounter (HOSPITAL_COMMUNITY)
Admission: RE | Admit: 2018-02-08 | Discharge: 2018-02-08 | Disposition: A | Discharge status: Home / Self Care | Payer: No Typology Code available for payment source | Source: Ambulatory Visit | Attending: Pulmonary Disease | Admitting: Pulmonary Disease

## 2018-02-08 VITALS — Wt 201.9 lb

## 2018-02-08 DIAGNOSIS — J449 Chronic obstructive pulmonary disease, unspecified: Secondary | ICD-10-CM | POA: Diagnosis not present

## 2018-02-08 NOTE — Progress Notes (Signed)
Daily Session Note  Patient Details  Name: Sean [name] MRN: [id number] Date of Birth: Sep 26, 1950 Referring Provider:     Pulmonary Rehab Walk Test from 01/02/2018 in [place]  Referring Provider  Dr. Gwenette Greet      Encounter Date: 02/08/2018  Check In: Session Check In - 02/08/18 1230      Check-In   Location  [facility]    Staff Present  Rosebud Poles, RN, BSN;Molly DiVincenzo, MS, ACSM RCEP, Exercise Physiologist;Lisa Ysidro Evert, Felipe Drone, RN, [facility]    Supervising physician immediately available to respond to emergencies  [facility] immediately available    Physician(s)  Dr. Bonner Puna    Medication changes reported      No    Fall or balance concerns reported     No    Tobacco Cessation  No Change    Resistance Training Performed  Yes    VAD Patient?  No    PAD/SET Patient?  No      Pain Assessment   Currently in Pain?  No/denies    Multiple Pain Sites  No       Capillary Blood Glucose: No results found for this or any previous visit (from the past 24 hour(s)).    Social History   Tobacco Use  Smoking Status Former Smoker  . Types: Cigarettes  . Last attempt to quit: 06/25/2013  . Years since quitting: 4.6  Smokeless Tobacco Former Systems developer  . Quit date: 06/25/1976    Goals Met:  Exercise tolerated well Strength training completed today  Goals Unmet:  Not Applicable  Comments: Service time is from 1230 to [place]    Dr. Rush Farmer is Medical Director for Pulmonary Rehab at [facility].

## 2018-02-13 ENCOUNTER — Encounter (HOSPITAL_COMMUNITY)
Admission: RE | Admit: 2018-02-13 | Discharge: 2018-02-13 | Disposition: A | Discharge status: Home / Self Care | Payer: No Typology Code available for payment source | Source: Ambulatory Visit | Attending: Pulmonary Disease | Admitting: Pulmonary Disease

## 2018-02-13 VITALS — Wt 201.5 lb

## 2018-02-13 DIAGNOSIS — Z7951 Long term (current) use of inhaled steroids: Secondary | ICD-10-CM | POA: Insufficient documentation

## 2018-02-13 DIAGNOSIS — J449 Chronic obstructive pulmonary disease, unspecified: Secondary | ICD-10-CM | POA: Diagnosis not present

## 2018-02-13 DIAGNOSIS — Z79899 Other long term (current) drug therapy: Secondary | ICD-10-CM | POA: Insufficient documentation

## 2018-02-13 DIAGNOSIS — M1990 Unspecified osteoarthritis, unspecified site: Secondary | ICD-10-CM | POA: Diagnosis not present

## 2018-02-13 DIAGNOSIS — I10 Essential (primary) hypertension: Secondary | ICD-10-CM | POA: Insufficient documentation

## 2018-02-13 DIAGNOSIS — Z87891 Personal history of nicotine dependence: Secondary | ICD-10-CM | POA: Insufficient documentation

## 2018-02-13 NOTE — Progress Notes (Signed)
Pulmonary Individual Treatment Plan  Patient Details  Name: Sean [name] MRN: [id number] Date of Birth: 04/29/1951 Referring Provider:     Pulmonary Rehab Walk Test from 01/02/2018 in [facility]  Referring Provider  Dr. Gwenette Greet      Initial Encounter Date:    Pulmonary Rehab Walk Test from 01/02/2018 in [facility]  Date  01/02/18      Visit Diagnosis: Chronic obstructive pulmonary disease, unspecified COPD type ([place])  Patient's Home Medications on Admission:   Current Outpatient Medications:  .  albuterol (PROVENTIL HFA;VENTOLIN HFA) 108 (90 BASE) MCG/ACT inhaler, Inhale 2 puffs into the lungs every 6 (six) hours as needed for wheezing or shortness of breath., Disp: , Rfl:  .  albuterol (PROVENTIL) (2.5 MG/3ML) 0.083% nebulizer solution, Take 2.5 mg by nebulization every 6 (six) hours as needed for wheezing or shortness of breath., Disp: , Rfl:  .  amLODipine (NORVASC) 10 MG tablet, Take 10 mg by mouth daily., Disp: , Rfl:  .  amLODipine (NORVASC) 10 MG tablet, Take 10 mg by mouth daily., Disp: , Rfl:  .  benazepril (LOTENSIN) 10 MG tablet, Take 10 mg by mouth daily. Take 1/2 daily, Disp: , Rfl:  .  budesonide-formoterol (SYMBICORT) 160-4.5 MCG/ACT inhaler, Inhale 2 puffs into the lungs 2 (two) times daily. Rinse mouth well with water after each use, Disp: , Rfl:  .  cholecalciferol (VITAMIN D) 1000 UNITS tablet, Take 1,000 Units by mouth 2 (two) times daily., Disp: , Rfl:  .  hydrocortisone 2.5 % cream, Apply topically 3 (three) times daily. rectally, Disp: , Rfl:  .  meloxicam (MOBIC) 15 MG tablet, Take 15 mg by mouth daily., Disp: , Rfl:  .  metoprolol (TOPROL-XL) 200 MG 24 hr tablet, Take 200 mg by mouth daily., Disp: , Rfl:  .  multivitamin-lutein (OCUVITE-LUTEIN) CAPS capsule, Take 1 capsule by mouth daily., Disp: , Rfl:  .  niacin 100 MG tablet, Take 100 mg by mouth at bedtime., Disp: , Rfl:  .  ondansetron (ZOFRAN) 8  MG tablet, Take by mouth every 8 (eight) hours as needed for nausea or vomiting., Disp: , Rfl:  .  pantoprazole (PROTONIX) 40 MG tablet, Take 40 mg by mouth 2 (two) times daily., Disp: , Rfl:  .  thiamine 100 MG tablet, Take 100 mg by mouth daily., Disp: , Rfl:  .  tiotropium (SPIRIVA) 18 MCG inhalation capsule, Place 18 mcg into inhaler and inhale daily., Disp: , Rfl:  .  urea (CARMOL) 20 % cream, Apply topically 3 (three) times daily., Disp: , Rfl:   Past Medical History: Past Medical History:  Diagnosis Date  . Arthritis   . COPD (chronic obstructive pulmonary disease) ([place])   . Hypertension     Tobacco Use: Social History   Tobacco Use  Smoking Status Former Smoker  . Types: Cigarettes  . Last attempt to quit: 06/25/2013  . Years since quitting: 4.6  Smokeless Tobacco Former Systems developer  . Quit date: 06/25/1976    Labs: Recent Review Flowsheet Data    There is no flowsheet data to display.      Capillary Blood Glucose: No results found for: GLUCAP   Pulmonary Assessment Scores: Pulmonary Assessment Scores    Row Name 01/02/18 1633 01/23/18 0935       ADL UCSD   ADL Phase  Entry  Entry    SOB Score total  -  46      CAT Score  CAT Score  -  10      mMRC Score   mMRC Score  3  -       Pulmonary Function Assessment:   Exercise Target Goals:    Exercise Program Goal: Individual exercise prescription set using results from initial 6 min walk test and THRR while considering  patient's activity barriers and safety.   Exercise Prescription Goal: Initial exercise prescription builds to 30-45 minutes a day of aerobic activity, 2-3 days per week.  Home exercise guidelines will be given to patient during program as part of exercise prescription that the participant will acknowledge.  Activity Barriers & Risk Stratification: Activity Barriers & Cardiac Risk Stratification - 12/29/17 1029      Activity Barriers & Cardiac Risk Stratification   Activity Barriers   Joint Problems;Arthritis bilateral knee pain       6 Minute Walk: 6 Minute Walk    Row Name 01/02/18 1635         6 Minute Walk   Phase  Initial     Distance  1443 feet     Walk Time  6 minutes     # of Rest Breaks  0     MPH  2.73     METS  3.07     RPE  13     Perceived Dyspnea   2     Symptoms  Yes (comment)     Comments  LEFT CALF BURNING     Resting HR  76 bpm     Resting BP  130/80     Resting Oxygen Saturation   92 %     Exercise Oxygen Saturation  during 6 min walk  89 %     Max Ex. HR  114 bpm     Max Ex. BP  152/84       Interval HR   1 Minute HR  88     2 Minute HR  90     3 Minute HR  82     4 Minute HR  87     5 Minute HR  88     6 Minute HR  114     2 Minute Post HR  82     Interval Heart Rate?  Yes       Interval Oxygen   Interval Oxygen?  Yes     Baseline Oxygen Saturation %  92 %     1 Minute Oxygen Saturation %  93 %     1 Minute Liters of Oxygen  0 L     2 Minute Oxygen Saturation %  89 %     2 Minute Liters of Oxygen  0 L     3 Minute Oxygen Saturation %  89 %     3 Minute Liters of Oxygen  0 L     4 Minute Oxygen Saturation %  89 %     4 Minute Liters of Oxygen  0 L     5 Minute Oxygen Saturation %  90 %     5 Minute Liters of Oxygen  0 L     6 Minute Oxygen Saturation %  89 %     6 Minute Liters of Oxygen  0 L     2 Minute Post Oxygen Saturation %  95 %     2 Minute Post Liters of Oxygen  0 L        Oxygen Initial Assessment: Oxygen Initial Assessment -  12/29/17 1004      Home Oxygen   Home Oxygen Device  None    Sleep Oxygen Prescription  None    Home Exercise Oxygen Prescription  None    Home at Rest Exercise Oxygen Prescription  None      Intervention   Short Term Goals  To learn and exhibit compliance with exercise, home and travel O2 prescription;To learn and understand importance of maintaining oxygen saturations>88%;To learn and demonstrate proper use of respiratory medications;To learn and understand importance of  monitoring SPO2 with pulse oximeter and demonstrate accurate use of the pulse oximeter.;To learn and demonstrate proper pursed lip breathing techniques or other breathing techniques.    Long  Term Goals  Exhibits compliance with exercise, home and travel O2 prescription;Verbalizes importance of monitoring SPO2 with pulse oximeter and return demonstration;Maintenance of O2 saturations>88%;Exhibits proper breathing techniques, such as pursed lip breathing or other method taught during program session;Compliance with respiratory medication;Demonstrates proper use of MDI's       Oxygen Re-Evaluation: Oxygen Re-Evaluation    Row Name 01/02/18 1632 01/16/18 0653 02/09/18 1346         Program Oxygen Prescription   Program Oxygen Prescription  -  None  None       Home Oxygen   Home Oxygen Device  -  None  None     Sleep Oxygen Prescription  -  None  None     Home Exercise Oxygen Prescription  None  None  None     Home at Rest Exercise Oxygen Prescription  None  None  None        Oxygen Discharge (Final Oxygen Re-Evaluation): Oxygen Re-Evaluation - 02/09/18 1346      Program Oxygen Prescription   Program Oxygen Prescription  None      Home Oxygen   Home Oxygen Device  None    Sleep Oxygen Prescription  None    Home Exercise Oxygen Prescription  None    Home at Rest Exercise Oxygen Prescription  None       Initial Exercise Prescription: Initial Exercise Prescription - 01/02/18 1600      Date of Initial Exercise RX and Referring Provider   Date  01/02/18    Referring Provider  Dr. Gwenette Greet      Oxygen   Oxygen  --      Bike   Level  0.4    Minutes  17      NuStep   Level  2    SPM  80    Minutes  17    METs  1.5      Track   Laps  10    Minutes  17      Prescription Details   Frequency (times per week)  2    Duration  Progress to 45 minutes of aerobic exercise without signs/symptoms of physical distress      Intensity   THRR 40-80% of Max Heartrate  62-124     Ratings of Perceived Exertion  11-13    Perceived Dyspnea  0-4      Progression   Progression  Continue progressive overload as per policy without signs/symptoms or physical distress.      Resistance Training   Training Prescription  Yes    Weight  blue bands    Reps  10-15       Perform Capillary Blood Glucose checks as needed.  Exercise Prescription Changes: Exercise Prescription Changes    Row Name 01/09/18 1600 01/23/18  1500 02/06/18 1500         Response to Exercise   Blood Pressure (Admit)  146/82  140/80  124/62     Blood Pressure (Exercise)  138/80  140/84  140/80     Blood Pressure (Exit)  124/72  142/70  116/60     Heart Rate (Admit)  79 bpm  76 bpm  72 bpm     Heart Rate (Exercise)  99 bpm  90 bpm  93 bpm     Heart Rate (Exit)  71 bpm  65 bpm  66 bpm     Oxygen Saturation (Admit)  93 %  91 %  89 %     Oxygen Saturation (Exercise)  89 %  89 %  91 %     Oxygen Saturation (Exit)  94 %  93 %  92 %     Rating of Perceived Exertion (Exercise)  _0 Perceived Dyspnea (Exercise)  _1 Symptoms  none  -  -     Comments  pt was oriented to exercise equipment today  -  -     Duration  Progress to 45 minutes of aerobic exercise without signs/symptoms of physical distress  Progress to 45 minutes of aerobic exercise without signs/symptoms of physical distress  Progress to 45 minutes of aerobic exercise without signs/symptoms of physical distress     Intensity  THRR unchanged  THRR unchanged  THRR unchanged       Progression   Progression  Continue to progress workloads to maintain intensity without signs/symptoms of physical distress.  Continue to progress workloads to maintain intensity without signs/symptoms of physical distress.  Continue to progress workloads to maintain intensity without signs/symptoms of physical distress.       Resistance Training   Training Prescription  Yes  Yes  Yes     Weight  blue bands  blue bands  blue bands     Reps  10-15 pt  does 10 minutes of strength training  10-15 pt does 10 minutes of strength training  10-15 pt does 10 minutes of strength training     Time  -  10 Minutes  10 Minutes       Interval Training   Interval Training  -  No  No       Bike   Level  0.6  0.8  1     Minutes  _2 NuStep   Level  2  2.5  4     SPM  80  80  80     Minutes  _3 METs  2.2  2.5  2.9       Track   Laps  _4 Minutes  _5 Home Exercise Plan   Plans to continue exercise at  [facility] (comment)  -     Frequency  -  Add 2 additional days to program exercise sessions.  -        Exercise Comments: Exercise Comments    Row Name 01/23/18 1604           Exercise Comments  Home exercise completed          Exercise Goals and Review: Exercise Goals  [place] Name 12/29/17 1025             Exercise Goals   Increase Physical Activity  Yes       Intervention  Provide advice, education, support and counseling about physical activity/exercise needs.;Develop an individualized exercise prescription for aerobic and resistive training based on initial evaluation findings, risk stratification, comorbidities and participant's personal goals.       Expected Outcomes  Short Term: Attend rehab on a regular basis to increase amount of physical activity.;Long Term: Add in home exercise to make exercise part of routine and to increase amount of physical activity.;Long Term: Exercising regularly at least 3-5 days a week.       Increase Strength and Stamina  Yes       Intervention  Provide advice, education, support and counseling about physical activity/exercise needs.;Develop an individualized exercise prescription for aerobic and resistive training based on initial evaluation findings, risk stratification, comorbidities and participant's personal goals.       Expected Outcomes  Short Term: Increase workloads from initial exercise prescription for resistance, speed, and  METs.;Short Term: Perform resistance training exercises routinely during rehab and add in resistance training at home;Long Term: Improve cardiorespiratory fitness, muscular endurance and strength as measured by increased METs and functional capacity (6MWT)       Able to understand and use rate of perceived exertion (RPE) scale  Yes       Intervention  Provide education and explanation on how to use RPE scale       Expected Outcomes  Short Term: Able to use RPE daily in rehab to express subjective intensity level;Long Term:  Able to use RPE to guide intensity level when exercising independently       Able to understand and use Dyspnea scale  Yes       Intervention  Provide education and explanation on how to use Dyspnea scale       Expected Outcomes  Short Term: Able to use Dyspnea scale daily in rehab to express subjective sense of shortness of breath during exertion;Long Term: Able to use Dyspnea scale to guide intensity level when exercising independently       Knowledge and understanding of Target Heart Rate Range (THRR)  Yes       Intervention  Provide education and explanation of THRR including how the numbers were predicted and where they are located for reference       Expected Outcomes  Short Term: Able to state/look up THRR;Long Term: Able to use THRR to govern intensity when exercising independently;Short Term: Able to use daily as guideline for intensity in rehab       Understanding of Exercise Prescription  Yes       Intervention  Provide education, explanation, and written materials on patient's individual exercise prescription       Expected Outcomes  Short Term: Able to explain program exercise prescription;Long Term: Able to explain home exercise prescription to exercise independently          Exercise Goals Re-Evaluation : Exercise Goals Re-Evaluation    Row Name 01/16/18 0653 01/16/18 0656 02/09/18 1347         Exercise Goal Re-Evaluation   Exercise Goals Review  Increase  Strength and Stamina;Increase Physical Activity;Able to understand and use rate of perceived exertion (RPE) scale;Knowledge and understanding of Target Heart Rate Range (THRR);Understanding of Exercise Prescription;Able to understand and use Dyspnea scale  -  Increase Strength and Stamina;Increase Physical Activity;Able to understand and use rate  of perceived exertion (RPE) scale;Knowledge and understanding of Target Heart Rate Range (THRR);Understanding of Exercise Prescription;Able to understand and use Dyspnea scale     Comments  Patient has only attended two rehab sessions. Will cont. to monitor and progress as able.   -  Patient is progressing at a slow and steady pace. Patient is able to walk 16 laps (200 ft each) in 15 minutes. Met level average is just below moderate. Barrier is his severe shortness of breath and progressed COPD. Patient is open to workload changes and making changes. Will cont. to monitor and motivate as able.     Expected Outcomes  Through e  Through exercise at rehab and at home, the patient will decrease shortness of breath with daily activities and feel confident in carrying out an exercise regime at home.   Through exercise at rehab and at home, the patient will decrease shortness of breath with daily activities and feel confident in carrying out an exercise regime at home.         Discharge Exercise Prescription (Final Exercise Prescription Changes): Exercise Prescription Changes - 02/06/18 1500      Response to Exercise   Blood Pressure (Admit)  124/62    Blood Pressure (Exercise)  140/80    Blood Pressure (Exit)  116/60    Heart Rate (Admit)  72 bpm    Heart Rate (Exercise)  93 bpm    Heart Rate (Exit)  66 bpm    Oxygen Saturation (Admit)  89 %    Oxygen Saturation (Exercise)  91 %    Oxygen Saturation (Exit)  92 %    Rating of Perceived Exertion (Exercise)  12    Perceived Dyspnea (Exercise)  2    Duration  Progress to 45 minutes of aerobic exercise without  signs/symptoms of physical distress    Intensity  THRR unchanged      Progression   Progression  Continue to progress workloads to maintain intensity without signs/symptoms of physical distress.      Resistance Training   Training Prescription  Yes    Weight  blue bands    Reps  10-15 pt does 10 minutes of strength training    Time  10 Minutes      Interval Training   Interval Training  No      Bike   Level  1    Minutes  17      NuStep   Level  4    SPM  80    Minutes  17    METs  2.9      Track   Laps  16    Minutes  17       Nutrition:  Target Goals: Understanding of nutrition guidelines, daily intake of sodium <1565m, cholesterol <2016m calories 30% from fat and 7% or less from saturated fats, daily to have 5 or more servings of fruits and vegetables.  Biometrics: Pre Biometrics - 01/09/18 1601      Pre Biometrics   Weight  203 lb 4.2 oz (92.2 kg)    BMI (Calculated)  33.06        Nutrition Therapy Plan and Nutrition Goals: Nutrition Therapy & Goals - 01/15/18 1037      Nutrition Therapy   Diet  General, healthful      Personal Nutrition Goals   Nutrition Goal  Wt loss of 1-2 lb/week to a wt loss goal of 6-24 lb at graduation from [place]  Goal #2  The pt will recognize symptoms that can interfere with adequate oral intake, such as shortness of breath, N/V, early satiety, fatigue, ability to secure and prepare food, taste and smell changes, chewing/swallowing difficulties, and/ or pain when eating.      Intervention Plan   Intervention  Prescribe, educate and counsel regarding individualized specific dietary modifications aiming towards targeted core components such as weight, hypertension, lipid management, diabetes, heart failure and other comorbidities.    Expected Outcomes  Short Term Goal: Understand basic principles of dietary content, such as calories, fat, sodium, cholesterol and nutrients.;Long Term Goal: Adherence to  prescribed nutrition plan.       Nutrition Assessments: Nutrition Assessments - 01/15/18 1037      Rate Your Plate Scores   Pre Score  53       Nutrition Goals Re-Evaluation:   Nutrition Goals Discharge (Final Nutrition Goals Re-Evaluation):   Psychosocial: Target Goals: Acknowledge presence or absence of significant depression and/or stress, maximize coping skills, provide positive support system. Participant is able to verbalize types and ability to use techniques and skills needed for reducing stress and depression.  Initial Review & Psychosocial Screening: Initial Psych Review & Screening - 12/29/17 1027      Initial Review   Current issues with  None Identified      Family Dynamics   Good Support System?  Yes    Comments  recently loss his dog of 5 years      Barriers   Psychosocial barriers to participate in program  The patient should benefit from training in stress management and relaxation.      Screening Interventions   Interventions  Encouraged to exercise    Expected Outcomes  Long Term Goal: Stressors or current issues are controlled or eliminated.;Short Term goal: Identification and review with participant of any Quality of Life or Depression concerns found by scoring the questionnaire.       Quality of Life Scores:  Scores of 19 and below usually indicate a poorer quality of life in these areas.  A difference of  2-3 points is a clinically meaningful difference.  A difference of 2-3 points in the total score of the Quality of Life Index has been associated with significant improvement in overall quality of life, self-image, physical symptoms, and general health in studies assessing change in quality of life.   PHQ-9: Recent Review Flowsheet Data    Depression screen [facility] 2/9 12/29/2017 10/29/2015 06/29/2015   Decreased Interest 0 0 1   Down, Depressed, Hopeless 0 0 0   PHQ - 2 Score 0 0 1   Altered sleeping 0 - -   Tired, decreased energy 1 - -    Change in appetite 0 - -   Feeling bad or failure about yourself  0 - -   Trouble concentrating 0 - -   Moving slowly or fidgety/restless 0 - -   Suicidal thoughts 0 - -   PHQ-9 Score 1 - -   Difficult doing work/chores Somewhat difficult - -     Interpretation of Total Score  Total Score Depression Severity:  1-4 = Minimal depression, 5-9 = Mild depression, 10-14 = Moderate depression, 15-19 = Moderately severe depression, 20-27 = Severe depression   Psychosocial Evaluation and Intervention: Psychosocial Evaluation - 01/17/18 1225      Psychosocial Evaluation & Interventions   Interventions  Stress management education;Relaxation education;Encouraged to exercise with the program and follow exercise prescription    Comments  Pt  feels supported in his rehab participation.  Pt misses his dog that he lost 5 years ago.    Expected Outcomes  Pt will be free of any psychosocial needs    Continue Psychosocial Services   Follow up required by staff       Psychosocial Re-Evaluation: Psychosocial Re-Evaluation    [place] Name 02/13/18 1147             Psychosocial Re-Evaluation   Current issues with  None Identified       Comments  No barriers to participation in pulmonary rehab       Expected Outcomes  No barriers to participation in pulmonary rehab       Interventions  Encouraged to attend Pulmonary Rehabilitation for the exercise;Relaxation education;Stress management education       Continue Psychosocial Services   No Follow up required          Psychosocial Discharge (Final Psychosocial Re-Evaluation): Psychosocial Re-Evaluation - 02/13/18 1147      Psychosocial Re-Evaluation   Current issues with  None Identified    Comments  No barriers to participation in pulmonary rehab    Expected Outcomes  No barriers to participation in pulmonary rehab    Interventions  Encouraged to attend Pulmonary Rehabilitation for the exercise;Relaxation education;Stress management education     Continue Psychosocial Services   No Follow up required        Education: Education Goals: Education classes will be provided on a weekly basis, covering required topics. Participant will state understanding/return demonstration of topics presented.  Learning Barriers/Preferences: Learning Barriers/Preferences - 12/29/17 1514      Learning Barriers/Preferences   Learning Barriers  None    Learning Preferences  Verbal Instruction;Individual Instruction;Group Instruction;Computer/Internet;Skilled Demonstration;Video;Written Material;Pictoral       Education Topics: How Lungs Work and Diseases: - Discuss the anatomy of the lungs and diseases that can affect the lungs, such as COPD.   Exercise: -Discuss the importance of exercise, FITT principles of exercise, normal and abnormal responses to exercise, and how to exercise safely.   Environmental Irritants: -Discuss types of environmental irritants and how to limit exposure to environmental irritants.   Meds/Inhalers and oxygen: - Discuss respiratory medications, definition of an inhaler and oxygen, and the proper way to use an inhaler and oxygen.   Energy Saving Techniques: - Discuss methods to conserve energy and decrease shortness of breath when performing activities of daily living.    Bronchial Hygiene / Breathing Techniques: - Discuss breathing mechanics, pursed-lip breathing technique,  proper posture, effective ways to clear airways, and other functional breathing techniques   Cleaning Equipment: - Provides group verbal and written instruction about the health risks of elevated stress, cause of high stress, and healthy ways to reduce stress.   Nutrition I: Fats: - Discuss the types of cholesterol, what cholesterol does to the body, and how cholesterol levels can be controlled.   Nutrition II: Labels: -Discuss the different components of food labels and how to read food labels.   Respiratory Infections: -  Discuss the signs and symptoms of respiratory infections, ways to prevent respiratory infections, and the importance of seeking medical treatment when having a respiratory infection.   Stress I: Signs and Symptoms: - Discuss the causes of stress, how stress may lead to anxiety and depression, and ways to limit stress.   Stress II: Relaxation: -Discuss relaxation techniques to limit stress.   Oxygen for Home/Travel: - Discuss how to prepare for travel when on oxygen and proper ways  to transport and store oxygen to ensure safety.   Knowledge Questionnaire Score: Knowledge Questionnaire Score - 01/23/18 0935      Knowledge Questionnaire Score   Pre Score  17/18       Core Components/Risk Factors/Patient Goals at Admission: Personal Goals and Risk Factors at Admission - 12/29/17 1024      Core Components/Risk Factors/Patient Goals on Admission    Weight Management  Weight Loss    Improve shortness of breath with ADL's  Yes    Intervention  Provide education, individualized exercise plan and daily activity instruction to help decrease symptoms of SOB with activities of daily living.    Expected Outcomes  Short Term: Improve cardiorespiratory fitness to achieve a reduction of symptoms when performing ADLs    Hypertension  Yes    Intervention  Provide education on lifestyle modifcations including regular physical activity/exercise, weight management, moderate sodium restriction and increased consumption of fresh fruit, vegetables, and low fat dairy, alcohol moderation, and smoking cessation.;Monitor prescription use compliance.    Expected Outcomes  Short Term: Continued assessment and intervention until BP is < 140/53m HG in hypertensive participants. < 130/826mHG in hypertensive participants with diabetes, heart failure or chronic kidney disease.;Long Term: Maintenance of blood pressure at goal levels.       Core Components/Risk Factors/Patient Goals Review:  Goals and Risk Factor  Review    Row Name 01/17/18 1220 02/13/18 1144           Core Components/Risk Factors/Patient Goals Review   Personal Goals Review  Weight Management/Obesity;Improve shortness of breath with ADL's;Hypertension  Weight Management/Obesity;Improve shortness of breath with ADL's;Hypertension      Review  Pt is off to a good start in pulmonary rehab.  Pt weight remains the same however pt has only attended 3 exercise sessions.  Pt is learning PLB and diaphragmatic breathing techniques.  Pt needs cues but is able to demonstrate appropriately.  Pt o2 sat do hoover in the upper 80's when ambulating on the track. Pt with continued elevation in BP Pre exercise despite compliance with his medications amd diet. However his BP post exercise is generally lower than pre.  I anticipate that with continued participation in the pulmonary rehab program  toward meating his goals.    Progressing well, would like to see a little more effort, second time through program, 1.0 on airdyne, 16 laps on track, level 4 on nustep      Expected Outcomes  -  see admission outcomes/goals         Core Components/Risk Factors/Patient Goals at Discharge (Final Review):  Goals and Risk Factor Review - 02/13/18 1144      Core Components/Risk Factors/Patient Goals Review   Personal Goals Review  Weight Management/Obesity;Improve shortness of breath with ADL's;Hypertension    Review  Progressing well, would like to see a little more effort, second time through program, 1.0 on airdyne, 16 laps on track, level 4 on nustep    Expected Outcomes  see admission outcomes/goals       ITP Comments: ITP Comments    Row Name 12/29/17 1004 01/17/18 1220         ITP Comments  Dr. WeJennet MaduroMedical Director  Dr. WeJennet MaduroMedical Director         Comments: ITP REVIEW Pt is making expected progress toward pulmonary rehab goals after completing 9 sessions. Recommend continued exercise, life style modification, education, and  utilization of breathing techniques to increase stamina and strength and decrease  shortness of breath with exertion.

## 2018-02-13 NOTE — Progress Notes (Signed)
Daily Session Note  Patient Details  Name: Vannie Hochstetler MRN: [id number] Date of Birth: [birth year] Referring Provider:     Pulmonary Rehab Walk Test from 01/02/2018 in [place]  Referring Provider  Dr. Gwenette Greet      Encounter Date: 02/13/2018  Check In: Session Check In - 02/13/18 1330      Check-In   Location  [facility]    Staff Present  Rosebud Poles, RN, BSN;Carlette Carlton, RN, BSN;Molly DiVincenzo, MS, ACSM RCEP, Exercise Physiologist;Lisa Ysidro Evert, Felipe Drone, RN, [facility]    Supervising physician immediately available to respond to emergencies  [facility] immediately available    Physician(s)  Dr. Bonner Puna    Medication changes reported      No    Fall or balance concerns reported     No    Tobacco Cessation  No Change    Warm-up and Cool-down  Performed as group-led instruction    Resistance Training Performed  Yes    VAD Patient?  No    PAD/SET Patient?  No      Pain Assessment   Currently in Pain?  No/denies    Multiple Pain Sites  No       Capillary Blood Glucose: No results found for this or any previous visit (from the past 24 hour(s)).    Social History   Tobacco Use  Smoking Status Former Smoker  . Types: Cigarettes  . Last attempt to quit: 06/25/2013  . Years since quitting: 4.6  Smokeless Tobacco Former Systems developer  . Quit date: 06/25/1976    Goals Met:  Exercise tolerated well Strength training completed today  Goals Unmet:  Not Applicable  Comments: Service time is from 1330 to 1505    Dr. Rush Farmer is Medical Director for Pulmonary Rehab at [facility].

## 2018-02-20 ENCOUNTER — Encounter (HOSPITAL_COMMUNITY)
Admission: RE | Admit: 2018-02-20 | Discharge: 2018-02-20 | Disposition: A | Discharge status: Home / Self Care | Payer: No Typology Code available for payment source | Source: Ambulatory Visit | Attending: Pulmonary Disease | Admitting: Pulmonary Disease

## 2018-02-20 VITALS — Wt 203.7 lb

## 2018-02-20 DIAGNOSIS — J449 Chronic obstructive pulmonary disease, unspecified: Secondary | ICD-10-CM

## 2018-02-20 NOTE — Progress Notes (Signed)
Daily Session Note  Patient Details  Name: Sean [name] MRN: [id number] Date of Birth: 26-Sep-1950 Referring Provider:     Pulmonary Rehab Walk Test from 01/02/2018 in [place]  Referring Provider  Dr. Gwenette Greet      Encounter Date: 02/20/2018  Check In: Session Check In - 02/20/18 1330      Check-In   Location  [facility]    Staff Present  Rosebud Poles, RN, BSN;Carlette Wilber Oliphant, RN, BSN;Lisa Ysidro Evert, Felipe Drone, RN, [facility]    Supervising physician immediately available to respond to emergencies  [facility] immediately available    Physician(s)  Dr. Verlon Au    Medication changes reported      No    Fall or balance concerns reported     No    Tobacco Cessation  No Change    Warm-up and Cool-down  Performed as group-led instruction    Resistance Training Performed  Yes    VAD Patient?  No    PAD/SET Patient?  No      Pain Assessment   Currently in Pain?  No/denies       Capillary Blood Glucose: No results found for this or any previous visit (from the past 24 hour(s)).  Exercise Prescription Changes - 02/20/18 1600      Response to Exercise   Blood Pressure (Admit)  126/68    Blood Pressure (Exercise)  144/80    Blood Pressure (Exit)  106/60    Heart Rate (Admit)  80 bpm    Heart Rate (Exercise)  99 bpm    Heart Rate (Exit)  77 bpm    Oxygen Saturation (Admit)  92 %    Oxygen Saturation (Exercise)  90 %    Oxygen Saturation (Exit)  92 %    Rating of Perceived Exertion (Exercise)  15    Perceived Dyspnea (Exercise)  2    Duration  Progress to 45 minutes of aerobic exercise without signs/symptoms of physical distress    Intensity  THRR unchanged      Progression   Progression  Continue to progress workloads to maintain intensity without signs/symptoms of physical distress.      Resistance Training   Training Prescription  Yes    Weight  blue bands    Reps  10-15 pt does 10 minutes of strength training    Time  10 Minutes      Interval Training   Interval Training  No      Bike   Level  1    Minutes  17      NuStep   Level  4    SPM  80    Minutes  17    METs  2.6      Track   Laps  12    Minutes  17       Social History   Tobacco Use  Smoking Status Former Smoker  . Types: Cigarettes  . Last attempt to quit: 06/25/2013  . Years since quitting: 4.6  Smokeless Tobacco Former Systems developer  . Quit date: 06/25/1976    Goals Met:  Exercise tolerated well Strength training completed today  Goals Unmet:  Not Applicable  Comments: Service time is from 1330 to 1520   Dr. Rush Farmer is Medical Director for Pulmonary Rehab at [facility].

## 2018-02-21 NOTE — Progress Notes (Signed)
Sean [name] 67 y.o. male   DOB: 1950-10-11 MRN: [id number]          Nutrition 1. Chronic obstructive pulmonary disease, unspecified COPD type ([place])    Past Medical History:  Diagnosis Date  . Arthritis   . COPD (chronic obstructive pulmonary disease) ([place])   . Hypertension    Meds reviewed. MVI, vitamin D, Niacin, Thiamin noted     Ht: Ht Readings from Last 1 Encounters:  12/29/17 5' 5.75" (1.67 m)     Wt:  Wt Readings from Last 3 Encounters:  02/20/18 203 lb 11.3 oz (92.4 kg)  02/13/18 201 lb 8 oz (91.4 kg)  02/08/18 201 lb 15.1 oz (91.6 kg)     BMI: 33.06    Current tobacco use? no  Labs:  Lipid Panel  No results found for: CHOL, TRIG, HDL, CHOLHDL, VLDL, LDLCALC, LDLDIRECT  No results found for: HGBA1C Note Spoke with pt. Pt is obese. Pt eats 3 meals a day; most prepared at home.  Making healthy food choices the majority of the time.  Pt's Rate Your Plate results reviewed with pt. Pt avoids salty food; does not use canned/ convenience food.  Pt does not add salt to food.  The role of sodium in lung disease reviewed with pt. Pt expressed understanding of the information reviewed.   Nutrition Diagnosis  ? Food-and nutrition-related knowledge deficit related to lack of exposure to information as related to diagnosis of pulmonary disease ? Overweight/obesity related to excessive energy intake as evidenced by a BMI of 33.06   Nutrition Intervention ? Pt's individual nutrition plan and goals reviewed with pt. ? Benefits of adopting healthy eating habits discussed when pt's Rate Your Plate reviewed.  Goal(s)  1. The pt will recognize symptoms that can interfere with adequate oral intake, such as shortness of breath, N/V, early satiety, fatigue, ability to secure and prepare food, taste and smell changes, chewing/swallowing difficulties, and/ or pain when eating. 2. Identify food quantities necessary to achieve wt loss of  -2# per week to a goal wt loss of 6-24 lb at  graduation from pulmonary rehab.   Plan:  Pt to attend Pulmonary Nutrition class Will provide client-centered nutrition education as part of interdisciplinary care.   Monitor and evaluate progress toward nutrition goal with team.  Monitor and Evaluate progress toward nutrition goal with team.   Laurina Bustle, MS, RD, LDN 02/21/2018 12:21 PM

## 2018-02-22 ENCOUNTER — Encounter (HOSPITAL_COMMUNITY)
Admission: RE | Admit: 2018-02-22 | Discharge: 2018-02-22 | Disposition: A | Discharge status: Home / Self Care | Payer: No Typology Code available for payment source | Source: Ambulatory Visit | Attending: Pulmonary Disease | Admitting: Pulmonary Disease

## 2018-02-22 VITALS — Wt 201.5 lb

## 2018-02-22 DIAGNOSIS — J449 Chronic obstructive pulmonary disease, unspecified: Secondary | ICD-10-CM

## 2018-02-22 NOTE — Progress Notes (Signed)
Daily Session Note  Patient Details  Name: Sean [name] MRN: [id number] Date of Birth: 01-Aug-1951 Referring Provider:     Pulmonary Rehab Walk Test from 01/02/2018 in [place]  Referring Provider  Dr. Gwenette Greet      Encounter Date: 02/22/2018  Check In: Session Check In - 02/22/18 1330      Check-In   Location  [facility]    Staff Present  Rosebud Poles, RN, BSN;Carlette Wilber Oliphant, RN, BSN;Lisa Ysidro Evert, Felipe Drone, RN, [facility]    Supervising physician immediately available to respond to emergencies  [facility] immediately available    Physician(s)  Dr. Broadus John    Medication changes reported      No    Fall or balance concerns reported     No    Tobacco Cessation  No Change    Warm-up and Cool-down  Performed as group-led instruction    Resistance Training Performed  Yes    VAD Patient?  No    PAD/SET Patient?  No      Pain Assessment   Currently in Pain?  No/denies    Multiple Pain Sites  No       Capillary Blood Glucose: No results found for this or any previous visit (from the past 24 hour(s)).    Social History   Tobacco Use  Smoking Status Former Smoker  . Types: Cigarettes  . Last attempt to quit: 06/25/2013  . Years since quitting: 4.6  Smokeless Tobacco Former Systems developer  . Quit date: 06/25/1976    Goals Met:  Exercise tolerated well Strength training completed today  Goals Unmet:  Not Applicable  Comments: Service time is from 1330 to 1525    Dr. Rush Farmer is Medical Director for Pulmonary Rehab at [facility].

## 2018-02-27 ENCOUNTER — Encounter (HOSPITAL_COMMUNITY): Payer: No Typology Code available for payment source

## 2018-03-01 ENCOUNTER — Encounter (HOSPITAL_COMMUNITY)
Admission: RE | Admit: 2018-03-01 | Discharge: 2018-03-01 | Disposition: A | Discharge status: Home / Self Care | Payer: No Typology Code available for payment source | Source: Ambulatory Visit | Attending: Pulmonary Disease | Admitting: Pulmonary Disease

## 2018-03-01 VITALS — Wt 201.3 lb

## 2018-03-01 DIAGNOSIS — J449 Chronic obstructive pulmonary disease, unspecified: Secondary | ICD-10-CM | POA: Diagnosis not present

## 2018-03-01 NOTE — Progress Notes (Signed)
Daily Session Note  Patient Details  Name: Sean [name] MRN: [id number] Date of Birth: [birth year] Referring Provider:     Pulmonary Rehab Walk Test from 01/02/2018 in [place]  Referring Provider  Dr. Gwenette Greet      Encounter Date: 03/01/2018  Check In: Session Check In - 03/01/18 1330      Check-In   Location  [facility]    Staff Present  Rosebud Poles, RN, BSN;Carlette Carlton, RN, BSN;Molly DiVincenzo, MS, ACSM RCEP, Exercise Physiologist;Lisa Ysidro Evert, Felipe Drone, RN, [facility]    Supervising physician immediately available to respond to emergencies  [facility] immediately available    Physician(s)  Dr. Denton Brick    Medication changes reported      No    Fall or balance concerns reported     No    Tobacco Cessation  No Change    Warm-up and Cool-down  Performed as group-led instruction    Resistance Training Performed  Yes    VAD Patient?  No    PAD/SET Patient?  No      Pain Assessment   Currently in Pain?  No/denies    Multiple Pain Sites  No       Capillary Blood Glucose: No results found for this or any previous visit (from the past 24 hour(s)).    Social History   Tobacco Use  Smoking Status Former Smoker  . Types: Cigarettes  . Last attempt to quit: 06/25/2013  . Years since quitting: 4.6  Smokeless Tobacco Former Systems developer  . Quit date: 06/25/1976    Goals Met:  Exercise tolerated well Strength training completed today  Goals Unmet:  Not Applicable  Comments: Service time is from 1330 to 1530   Dr. Rush Farmer is Medical Director for Pulmonary Rehab at [facility].

## 2018-03-06 ENCOUNTER — Encounter (HOSPITAL_COMMUNITY)
Admission: RE | Admit: 2018-03-06 | Discharge: 2018-03-06 | Disposition: A | Discharge status: Home / Self Care | Payer: No Typology Code available for payment source | Source: Ambulatory Visit | Attending: Pulmonary Disease | Admitting: Pulmonary Disease

## 2018-03-06 VITALS — Wt 200.8 lb

## 2018-03-06 DIAGNOSIS — J449 Chronic obstructive pulmonary disease, unspecified: Secondary | ICD-10-CM | POA: Diagnosis not present

## 2018-03-06 NOTE — Progress Notes (Signed)
Pulmonary Individual Treatment Plan  Patient Details  Name: Sean [name] MRN: [id number] Date of Birth: 09-07-1950 Referring Provider:     Pulmonary Rehab Walk Test from 01/02/2018 in [place]  Referring Provider  Dr. Gwenette Greet      Initial Encounter Date:    Pulmonary Rehab Walk Test from 01/02/2018 in [place]  Date  01/02/18      Visit Diagnosis: Chronic obstructive pulmonary disease, unspecified COPD type ([place])  Patient's Home Medications on Admission:   Current Outpatient Medications:  .  albuterol (PROVENTIL HFA;VENTOLIN HFA) 108 (90 BASE) MCG/ACT inhaler, Inhale 2 puffs into the lungs every 6 (six) hours as needed for wheezing or shortness of breath., Disp: , Rfl:  .  albuterol (PROVENTIL) (2.5 MG/3ML) 0.083% nebulizer solution, Take 2.5 mg by nebulization every 6 (six) hours as needed for wheezing or shortness of breath., Disp: , Rfl:  .  amLODipine (NORVASC) 10 MG tablet, Take 10 mg by mouth daily., Disp: , Rfl:  .  amLODipine (NORVASC) 10 MG tablet, Take 10 mg by mouth daily., Disp: , Rfl:  .  benazepril (LOTENSIN) 10 MG tablet, Take 10 mg by mouth daily. Take 1/2 daily, Disp: , Rfl:  .  budesonide-formoterol (SYMBICORT) 160-4.5 MCG/ACT inhaler, Inhale 2 puffs into the lungs 2 (two) times daily. Rinse mouth well with water after each use, Disp: , Rfl:  .  cholecalciferol (VITAMIN D) 1000 UNITS tablet, Take 1,000 Units by mouth 2 (two) times daily., Disp: , Rfl:  .  hydrocortisone 2.5 % cream, Apply topically 3 (three) times daily. rectally, Disp: , Rfl:  .  meloxicam (MOBIC) 15 MG tablet, Take 15 mg by mouth daily., Disp: , Rfl:  .  metoprolol (TOPROL-XL) 200 MG 24 hr tablet, Take 200 mg by mouth daily., Disp: , Rfl:  .  multivitamin-lutein (OCUVITE-LUTEIN) CAPS capsule, Take 1 capsule by mouth daily., Disp: , Rfl:  .  niacin 100 MG tablet, Take 100 mg by mouth at bedtime., Disp: , Rfl:  .  ondansetron (ZOFRAN) 8  MG tablet, Take by mouth every 8 (eight) hours as needed for nausea or vomiting., Disp: , Rfl:  .  pantoprazole (PROTONIX) 40 MG tablet, Take 40 mg by mouth 2 (two) times daily., Disp: , Rfl:  .  thiamine 100 MG tablet, Take 100 mg by mouth daily., Disp: , Rfl:  .  tiotropium (SPIRIVA) 18 MCG inhalation capsule, Place 18 mcg into inhaler and inhale daily., Disp: , Rfl:  .  urea (CARMOL) 20 % cream, Apply topically 3 (three) times daily., Disp: , Rfl:   Past Medical History: Past Medical History:  Diagnosis Date  . Arthritis   . COPD (chronic obstructive pulmonary disease) ([place])   . Hypertension     Tobacco Use: Social History   Tobacco Use  Smoking Status Former Smoker  . Types: Cigarettes  . Last attempt to quit: 06/25/2013  . Years since quitting: 4.7  Smokeless Tobacco Former Systems developer  . Quit date: 06/25/1976    Labs: Recent Review Flowsheet Data    There is no flowsheet data to display.      Capillary Blood Glucose: No results found for: GLUCAP   Pulmonary Assessment Scores: Pulmonary Assessment Scores    Row Name 01/02/18 1633 01/23/18 0935       ADL UCSD   ADL Phase  Entry  Entry    SOB Score total  -  46      CAT Score  CAT Score  -  10      mMRC Score   mMRC Score  3  -       Pulmonary Function Assessment:   Exercise Target Goals:    Exercise Program Goal: Individual exercise prescription set using results from initial 6 min walk test and THRR while considering  patient's activity barriers and safety.    Exercise Prescription Goal: Initial exercise prescription builds to 30-45 minutes a day of aerobic activity, 2-3 days per week.  Home exercise guidelines will be given to patient during program as part of exercise prescription that the participant will acknowledge.  Activity Barriers & Risk Stratification: Activity Barriers & Cardiac Risk Stratification - 12/29/17 1029      Activity Barriers & Cardiac Risk Stratification   Activity Barriers   Joint Problems;Arthritis bilateral knee pain       6 Minute Walk: 6 Minute Walk    Row Name 01/02/18 1635         6 Minute Walk   Phase  Initial     Distance  1443 feet     Walk Time  6 minutes     # of Rest Breaks  0     MPH  2.73     METS  3.07     RPE  13     Perceived Dyspnea   2     Symptoms  Yes (comment)     Comments  LEFT CALF BURNING     Resting HR  76 bpm     Resting BP  130/80     Resting Oxygen Saturation   92 %     Exercise Oxygen Saturation  during 6 min walk  89 %     Max Ex. HR  114 bpm     Max Ex. BP  152/84       Interval HR   1 Minute HR  88     2 Minute HR  90     3 Minute HR  82     4 Minute HR  87     5 Minute HR  88     6 Minute HR  114     2 Minute Post HR  82     Interval Heart Rate?  Yes       Interval Oxygen   Interval Oxygen?  Yes     Baseline Oxygen Saturation %  92 %     1 Minute Oxygen Saturation %  93 %     1 Minute Liters of Oxygen  0 L     2 Minute Oxygen Saturation %  89 %     2 Minute Liters of Oxygen  0 L     3 Minute Oxygen Saturation %  89 %     3 Minute Liters of Oxygen  0 L     4 Minute Oxygen Saturation %  89 %     4 Minute Liters of Oxygen  0 L     5 Minute Oxygen Saturation %  90 %     5 Minute Liters of Oxygen  0 L     6 Minute Oxygen Saturation %  89 %     6 Minute Liters of Oxygen  0 L     2 Minute Post Oxygen Saturation %  95 %     2 Minute Post Liters of Oxygen  0 L        Oxygen Initial Assessment: Oxygen Initial  Assessment - 12/29/17 1004      Home Oxygen   Home Oxygen Device  None    Sleep Oxygen Prescription  None    Home Exercise Oxygen Prescription  None    Home at Rest Exercise Oxygen Prescription  None      Intervention   Short Term Goals  To learn and exhibit compliance with exercise, home and travel O2 prescription;To learn and understand importance of maintaining oxygen saturations>88%;To learn and demonstrate proper use of respiratory medications;To learn and understand importance of  monitoring SPO2 with pulse oximeter and demonstrate accurate use of the pulse oximeter.;To learn and demonstrate proper pursed lip breathing techniques or other breathing techniques.    Long  Term Goals  Exhibits compliance with exercise, home and travel O2 prescription;Verbalizes importance of monitoring SPO2 with pulse oximeter and return demonstration;Maintenance of O2 saturations>88%;Exhibits proper breathing techniques, such as pursed lip breathing or other method taught during program session;Compliance with respiratory medication;Demonstrates proper use of MDI's       Oxygen Re-Evaluation: Oxygen Re-Evaluation    Row Name 01/02/18 1632 01/16/18 0653 02/09/18 1346 03/05/18 1149       Program Oxygen Prescription   Program Oxygen Prescription  -  None  None  None      Home Oxygen   Home Oxygen Device  -  None  None  None    Sleep Oxygen Prescription  -  None  None  None    Home Exercise Oxygen Prescription  None  None  None  None    Home at Rest Exercise Oxygen Prescription  None  None  None  None       Oxygen Discharge (Final Oxygen Re-Evaluation): Oxygen Re-Evaluation - 03/05/18 1149      Program Oxygen Prescription   Program Oxygen Prescription  None      Home Oxygen   Home Oxygen Device  None    Sleep Oxygen Prescription  None    Home Exercise Oxygen Prescription  None    Home at Rest Exercise Oxygen Prescription  None       Initial Exercise Prescription: Initial Exercise Prescription - 01/02/18 1600      Date of Initial Exercise RX and Referring Provider   Date  01/02/18    Referring Provider  Dr. Gwenette Greet      Oxygen   Oxygen  --      Bike   Level  0.4    Minutes  17      NuStep   Level  2    SPM  80    Minutes  17    METs  1.5      Track   Laps  10    Minutes  17      Prescription Details   Frequency (times per week)  2    Duration  Progress to 45 minutes of aerobic exercise without signs/symptoms of physical distress      Intensity   THRR  40-80% of Max Heartrate  62-124    Ratings of Perceived Exertion  11-13    Perceived Dyspnea  0-4      Progression   Progression  Continue progressive overload as per policy without signs/symptoms or physical distress.      Resistance Training   Training Prescription  Yes    Weight  blue bands    Reps  10-15       Perform Capillary Blood Glucose checks as needed.  Exercise Prescription Changes:  Exercise Prescription Changes  [place] Name 01/09/18 1600 01/23/18 1500 02/06/18 1500 02/20/18 1600 03/06/18 1600     Response to Exercise   Blood Pressure (Admit)  146/82  140/80  124/62  126/68  122/64   Blood Pressure (Exercise)  138/80  140/84  140/80  144/80  128/84   Blood Pressure (Exit)  124/72  142/70  116/60  106/60  122/76   Heart Rate (Admit)  79 bpm  76 bpm  72 bpm  80 bpm  85 bpm   Heart Rate (Exercise)  99 bpm  90 bpm  93 bpm  99 bpm  102 bpm   Heart Rate (Exit)  71 bpm  65 bpm  66 bpm  77 bpm  82 bpm   Oxygen Saturation (Admit)  93 %  91 %  89 %  92 %  90 %   Oxygen Saturation (Exercise)  89 %  89 %  91 %  90 %  88 %   Oxygen Saturation (Exit)  94 %  93 %  92 %  92 %  91 %   Rating of Perceived Exertion (Exercise)  '17  11  12  15  14   '$ Perceived Dyspnea (Exercise)  '1  1  2  2  3   '$ Symptoms  none  -  -  -  -   Comments  pt was oriented to exercise equipment today  -  -  -  -   Duration  Progress to 45 minutes of aerobic exercise without signs/symptoms of physical distress  Progress to 45 minutes of aerobic exercise without signs/symptoms of physical distress  Progress to 45 minutes of aerobic exercise without signs/symptoms of physical distress  Progress to 45 minutes of aerobic exercise without signs/symptoms of physical distress  Progress to 45 minutes of aerobic exercise without signs/symptoms of physical distress   Intensity  THRR unchanged  THRR unchanged  THRR unchanged  THRR unchanged  THRR unchanged     Progression   Progression  Continue to progress workloads to  maintain intensity without signs/symptoms of physical distress.  Continue to progress workloads to maintain intensity without signs/symptoms of physical distress.  Continue to progress workloads to maintain intensity without signs/symptoms of physical distress.  Continue to progress workloads to maintain intensity without signs/symptoms of physical distress.  Continue to progress workloads to maintain intensity without signs/symptoms of physical distress.     Resistance Training   Training Prescription  Yes  Yes  Yes  Yes  Yes   Weight  blue bands  blue bands  blue bands  blue bands  blue bands   Reps  10-15 pt does 10 minutes of strength training  10-15 pt does 10 minutes of strength training  10-15 pt does 10 minutes of strength training  10-15 pt does 10 minutes of strength training  10-15 pt does 10 minutes of strength training   Time  -  10 Minutes  10 Minutes  10 Minutes  10 Minutes     Interval Training   Interval Training  -  No  No  No  No     Bike   Level  0.6  0.'8  1  1  1   '$ Minutes  '17  17  17  17  17     '$ NuStep   Level  2  2.'5  4  4  5   '$ SPM  80  80  80  80  80   Minutes  17  17  $'17  17  17   'w$ METs  2.2  2.5  2.9  2.6  3     Track   Laps  '16  13  16  12  13   '$ Minutes  '17  17  17  17  17     '$ Home Exercise Plan   Plans to continue exercise at  [facility] (comment)  -  -  -   Frequency  -  Add 2 additional days to program exercise sessions.  -  -  -      Exercise Comments:  Exercise Comments    Row Name 01/23/18 1604           Exercise Comments  Home exercise completed          Exercise Goals and Review:  Exercise Goals    [place] Name 12/29/17 1025             Exercise Goals   Increase Physical Activity  Yes       Intervention  Provide advice, education, support and counseling about physical activity/exercise needs.;Develop an individualized exercise prescription for aerobic and resistive training based on initial evaluation findings, risk  stratification, comorbidities and participant's personal goals.       Expected Outcomes  Short Term: Attend rehab on a regular basis to increase amount of physical activity.;Long Term: Add in home exercise to make exercise part of routine and to increase amount of physical activity.;Long Term: Exercising regularly at least 3-5 days a week.       Increase Strength and Stamina  Yes       Intervention  Provide advice, education, support and counseling about physical activity/exercise needs.;Develop an individualized exercise prescription for aerobic and resistive training based on initial evaluation findings, risk stratification, comorbidities and participant's personal goals.       Expected Outcomes  Short Term: Increase workloads from initial exercise prescription for resistance, speed, and METs.;Short Term: Perform resistance training exercises routinely during rehab and add in resistance training at home;Long Term: Improve cardiorespiratory fitness, muscular endurance and strength as measured by increased METs and functional capacity (6MWT)       Able to understand and use rate of perceived exertion (RPE) scale  Yes       Intervention  Provide education and explanation on how to use RPE scale       Expected Outcomes  Short Term: Able to use RPE daily in rehab to express subjective intensity level;Long Term:  Able to use RPE to guide intensity level when exercising independently       Able to understand and use Dyspnea scale  Yes       Intervention  Provide education and explanation on how to use Dyspnea scale       Expected Outcomes  Short Term: Able to use Dyspnea scale daily in rehab to express subjective sense of shortness of breath during exertion;Long Term: Able to use Dyspnea scale to guide intensity level when exercising independently       Knowledge and understanding of Target Heart Rate Range (THRR)  Yes       Intervention  Provide education and explanation of THRR including how the numbers  were predicted and where they are located for reference       Expected Outcomes  Short Term: Able to state/look up THRR;Long Term: Able to use THRR to govern intensity when exercising independently;Short Term: Able to use daily as guideline for intensity in rehab  Understanding of Exercise Prescription  Yes       Intervention  Provide education, explanation, and written materials on patient's individual exercise prescription       Expected Outcomes  Short Term: Able to explain program exercise prescription;Long Term: Able to explain home exercise prescription to exercise independently          Exercise Goals Re-Evaluation : Exercise Goals Re-Evaluation    Row Name 01/16/18 0653 01/16/18 0656 02/09/18 1347 03/05/18 1149       Exercise Goal Re-Evaluation   Exercise Goals Review  Increase Strength and Stamina;Increase Physical Activity;Able to understand and use rate of perceived exertion (RPE) scale;Knowledge and understanding of Target Heart Rate Range (THRR);Understanding of Exercise Prescription;Able to understand and use Dyspnea scale  -  Increase Strength and Stamina;Increase Physical Activity;Able to understand and use rate of perceived exertion (RPE) scale;Knowledge and understanding of Target Heart Rate Range (THRR);Understanding of Exercise Prescription;Able to understand and use Dyspnea scale  Increase Strength and Stamina;Increase Physical Activity;Able to understand and use rate of perceived exertion (RPE) scale;Knowledge and understanding of Target Heart Rate Range (THRR);Understanding of Exercise Prescription;Able to understand and use Dyspnea scale    Comments  Patient has only attended two rehab sessions. Will cont. to monitor and progress as able.   -  Patient is progressing at a slow and steady pace. Patient is able to walk 16 laps (200 ft each) in 15 minutes. Met level average is just below moderate. Barrier is his severe shortness of breath and progressed COPD. Patient is open  to workload changes and making changes. Will cont. to monitor and motivate as able.  Patient is progressing at a slow and steady pace. Patient is able to walk 16 laps (200 ft each) in 15 minutes. Met level average is just below moderate. Barrier is his severe shortness of breath and progressed COPD. Patient is open to workload changes and making changes. Will cont. to monitor and motivate as able.    Expected Outcomes  Through e  Through exercise at rehab and at home, the patient will decrease shortness of breath with daily activities and feel confident in carrying out an exercise regime at home.   Through exercise at rehab and at home, the patient will decrease shortness of breath with daily activities and feel confident in carrying out an exercise regime at home.   Through exercise at rehab and at home, the patient will decrease shortness of breath with daily activities and feel confident in carrying out an exercise regime at home.        Discharge Exercise Prescription (Final Exercise Prescription Changes): Exercise Prescription Changes - 03/06/18 1600      Response to Exercise   Blood Pressure (Admit)  122/64    Blood Pressure (Exercise)  128/84    Blood Pressure (Exit)  122/76    Heart Rate (Admit)  85 bpm    Heart Rate (Exercise)  102 bpm    Heart Rate (Exit)  82 bpm    Oxygen Saturation (Admit)  90 %    Oxygen Saturation (Exercise)  88 %    Oxygen Saturation (Exit)  91 %    Rating of Perceived Exertion (Exercise)  14    Perceived Dyspnea (Exercise)  3    Duration  Progress to 45 minutes of aerobic exercise without signs/symptoms of physical distress    Intensity  THRR unchanged      Progression   Progression  Continue to progress workloads to maintain intensity without signs/symptoms  of physical distress.      Resistance Training   Training Prescription  Yes    Weight  blue bands    Reps  10-15 pt does 10 minutes of strength training    Time  10 Minutes      Interval Training    Interval Training  No      Bike   Level  1    Minutes  17      NuStep   Level  5    SPM  80    Minutes  17    METs  3      Track   Laps  13    Minutes  17       Nutrition:  Target Goals: Understanding of nutrition guidelines, daily intake of sodium '1500mg'$ , cholesterol '200mg'$ , calories 30% from fat and 7% or less from saturated fats, daily to have 5 or more servings of fruits and vegetables.  Biometrics: Pre Biometrics - 01/09/18 1601      Pre Biometrics   Weight  203 lb 4.2 oz (92.2 kg)    BMI (Calculated)  33.06        Nutrition Therapy Plan and Nutrition Goals: Nutrition Therapy & Goals - 01/15/18 1037      Nutrition Therapy   Diet  General, healthful      Personal Nutrition Goals   Nutrition Goal  Wt loss of 1-2 lb/week to a wt loss goal of 6-24 lb at graduation from Pulmonary Rehab    Personal Goal #2  The pt will recognize symptoms that can interfere with adequate oral intake, such as shortness of breath, N/V, early satiety, fatigue, ability to secure and prepare food, taste and smell changes, chewing/swallowing difficulties, and/ or pain when eating.      Intervention Plan   Intervention  Prescribe, educate and counsel regarding individualized specific dietary modifications aiming towards targeted core components such as weight, hypertension, lipid management, diabetes, heart failure and other comorbidities.    Expected Outcomes  Short Term Goal: Understand basic principles of dietary content, such as calories, fat, sodium, cholesterol and nutrients.;Long Term Goal: Adherence to prescribed nutrition plan.       Nutrition Assessments: Nutrition Assessments - 01/15/18 1037      Rate Your Plate Scores   Pre Score  53       Nutrition Goals Re-Evaluation:   Nutrition Goals Discharge (Final Nutrition Goals Re-Evaluation):   Psychosocial: Target Goals: Acknowledge presence or absence of significant depression and/or stress, maximize coping skills,  provide positive support system. Participant is able to verbalize types and ability to use techniques and skills needed for reducing stress and depression.  Initial Review & Psychosocial Screening: Initial Psych Review & Screening - 12/29/17 1027      Initial Review   Current issues with  None Identified      Family Dynamics   Good Support System?  Yes    Comments  recently loss his dog of 5 years      Barriers   Psychosocial barriers to participate in program  The patient should benefit from training in stress management and relaxation.      Screening Interventions   Interventions  Encouraged to exercise    Expected Outcomes  Long Term Goal: Stressors or current issues are controlled or eliminated.;Short Term goal: Identification and review with participant of any Quality of Life or Depression concerns found by scoring the questionnaire.       Quality of Life Scores:  Scores of 19 and below usually indicate a poorer quality of life in these areas.  A difference of  2-3 points is a clinically meaningful difference.  A difference of 2-3 points in the total score of the Quality of Life Index has been associated with significant improvement in overall quality of life, self-image, physical symptoms, and general health in studies assessing change in quality of life.   PHQ-9: Recent Review Flowsheet Data    Depression screen [facility] 2/9 12/29/2017 10/29/2015 06/29/2015   Decreased Interest 0 0 1   Down, Depressed, Hopeless 0 0 0   PHQ - 2 Score 0 0 1   Altered sleeping 0 - -   Tired, decreased energy 1 - -   Change in appetite 0 - -   Feeling bad or failure about yourself  0 - -   Trouble concentrating 0 - -   Moving slowly or fidgety/restless 0 - -   Suicidal thoughts 0 - -   PHQ-9 Score 1 - -   Difficult doing work/chores Somewhat difficult - -     Interpretation of Total Score  Total Score Depression Severity:  1-4 = Minimal depression, 5-9 = Mild depression, 10-14 = Moderate  depression, 15-19 = Moderately severe depression, 20-27 = Severe depression   Psychosocial Evaluation and Intervention: Psychosocial Evaluation - 03/06/18 1041      Psychosocial Evaluation & Interventions   Interventions  Stress management education;Relaxation education;Encouraged to exercise with the program and follow exercise prescription    Comments  Pt feels supported in his rehab participation.  Pt misses his dog that he lost 5 years ago.    Expected Outcomes  Pt will be free of any psychosocial needs    Continue Psychosocial Services   Follow up required by staff       Psychosocial Re-Evaluation: Psychosocial Re-Evaluation    [place] Name 02/13/18 1147 03/06/18 1041           Psychosocial Re-Evaluation   Current issues with  None Identified  None Identified      Comments  No barriers to participation in pulmonary rehab  No barriers to participation in pulmonary rehab      Expected Outcomes  No barriers to participation in pulmonary rehab  No barriers to participation in pulmonary rehab      Interventions  Encouraged to attend Pulmonary Rehabilitation for the exercise;Relaxation education;Stress management education  Encouraged to attend Pulmonary Rehabilitation for the exercise;Relaxation education;Stress management education      Continue Psychosocial Services   No Follow up required  Follow up required by staff         Psychosocial Discharge (Final Psychosocial Re-Evaluation): Psychosocial Re-Evaluation - 03/06/18 1041      Psychosocial Re-Evaluation   Current issues with  None Identified    Comments  No barriers to participation in pulmonary rehab    Expected Outcomes  No barriers to participation in pulmonary rehab    Interventions  Encouraged to attend Pulmonary Rehabilitation for the exercise;Relaxation education;Stress management education    Continue Psychosocial Services   Follow up required by staff       Education: Education Goals: Education classes will be  provided on a weekly basis, covering required topics. Participant will state understanding/return demonstration of topics presented.  Learning Barriers/Preferences: Learning Barriers/Preferences - 12/29/17 1514      Learning Barriers/Preferences   Learning Barriers  None    Learning Preferences  Verbal Instruction;Individual Instruction;Group Instruction;Computer/Internet;Skilled Demonstration;Video;Written Material;Pictoral       Education  Topics: Risk Factor Reduction:  -Group instruction that is supported by a PowerPoint presentation. Instructor discusses the definition of a risk factor, different risk factors for pulmonary disease, and how the heart and lungs work together.     Nutrition for Pulmonary Patient:  -Group instruction provided by PowerPoint slides, verbal discussion, and written materials to support subject matter. The instructor gives an explanation and review of healthy diet recommendations, which includes a discussion on weight management, recommendations for fruit and vegetable consumption, as well as protein, fluid, caffeine, fiber, sodium, sugar, and alcohol. Tips for eating when patients are short of breath are discussed.   Pursed Lip Breathing:  -Group instruction that is supported by demonstration and informational handouts. Instructor discusses the benefits of pursed lip and diaphragmatic breathing and detailed demonstration on how to preform both.     Oxygen Safety:  -Group instruction provided by PowerPoint, verbal discussion, and written material to support subject matter. There is an overview of "What is Oxygen" and "Why do we need it".  Instructor also reviews how to create a safe environment for oxygen use, the importance of using oxygen as prescribed, and the risks of noncompliance. There is a brief discussion on traveling with oxygen and resources the patient may utilize.   PULMONARY REHAB CHRONIC OBSTRUCTIVE PULMONARY DISEASE from 03/01/2018 in [place]  Date  03/01/18  Educator  Cloyde Reams  Instruction Review Code  1- Verbalizes Understanding      Oxygen Equipment:  -Group instruction provided by [employer] utilizing handouts, written materials, and Insurance underwriter.   Signs and Symptoms:  -Group instruction provided by written material and verbal discussion to support subject matter. Warning signs and symptoms of infection, stroke, and heart attack are reviewed and when to call the physician/911 reinforced. Tips for preventing the spread of infection discussed.   PULMONARY REHAB CHRONIC OBSTRUCTIVE PULMONARY DISEASE from 03/01/2018 in [place]  Date  02/22/18  Educator  Remo Lipps  Instruction Review Code  1- Verbalizes Understanding      Advanced Directives:  -Group instruction provided by verbal instruction and written material to support subject matter. Instructor reviews Advanced Directive laws and proper instruction for filling out document.   Pulmonary Video:  -Group video education that reviews the importance of medication and oxygen compliance, exercise, good nutrition, pulmonary hygiene, and pursed lip and diaphragmatic breathing for the pulmonary patient.   Exercise for the Pulmonary Patient:  -Group instruction that is supported by a PowerPoint presentation. Instructor discusses benefits of exercise, core components of exercise, frequency, duration, and intensity of an exercise routine, importance of utilizing pulse oximetry during exercise, safety while exercising, and options of places to exercise outside of rehab.     Pulmonary Medications:  -Verbally interactive group education provided by instructor with focus on inhaled medications and proper administration.   PULMONARY REHAB CHRONIC OBSTRUCTIVE PULMONARY DISEASE from 02/22/2018 in [place]  Date  02/01/18  Educator  pharmacy  Instruction Review Code  1-  Verbalizes Understanding      Anatomy and Physiology of the Respiratory System and Intimacy:  -Group instruction provided by PowerPoint, verbal discussion, and written material to support subject matter. Instructor reviews respiratory cycle and anatomical components of the respiratory system and their functions. Instructor also reviews differences in obstructive and restrictive respiratory diseases with examples of each. Intimacy, Sex, and Sexuality differences are reviewed with a discussion on how relationships can change when diagnosed with pulmonary  disease. Common sexual concerns are reviewed.   PULMONARY REHAB CHRONIC OBSTRUCTIVE PULMONARY DISEASE from 02/22/2018 in [place]  Date  01/11/18  Educator  rn  Instruction Review Code  2- Demonstrated Understanding      MD DAY -A group question and answer session with a medical doctor that allows participants to ask questions that relate to their pulmonary disease state.   PULMONARY REHAB CHRONIC OBSTRUCTIVE PULMONARY DISEASE from 02/22/2018 in [place]  Date  01/18/18  Educator  D. Nelda Marseille  Instruction Review Code  1- Verbalizes Understanding      OTHER EDUCATION -Group or individual verbal, written, or video instructions that support the educational goals of the pulmonary rehab program.   PULMONARY REHAB CHRONIC OBSTRUCTIVE PULMONARY DISEASE from 02/22/2018 in [place]  Date  02/08/18  Educator  Lucianne Lei [facility]]  Instruction Review Code  1- Verbalizes Understanding      Holiday Eating Survival Tips:  -Group instruction provided by PowerPoint slides, verbal discussion, and written materials to support subject matter. The instructor gives patients tips, tricks, and techniques to help them not only survive but enjoy the holidays despite the onslaught of food that accompanies the holidays.   Knowledge  Questionnaire Score: Knowledge Questionnaire Score - 01/23/18 0935      Knowledge Questionnaire Score   Pre Score  17/18       Core Components/Risk Factors/Patient Goals at Admission: Personal Goals and Risk Factors at Admission - 12/29/17 1024      Core Components/Risk Factors/Patient Goals on Admission    Weight Management  Weight Loss    Improve shortness of breath with ADL's  Yes    Intervention  Provide education, individualized exercise plan and daily activity instruction to help decrease symptoms of SOB with activities of daily living.    Expected Outcomes  Short Term: Improve cardiorespiratory fitness to achieve a reduction of symptoms when performing ADLs    Hypertension  Yes    Intervention  Provide education on lifestyle modifcations including regular physical activity/exercise, weight management, moderate sodium restriction and increased consumption of fresh fruit, vegetables, and low fat dairy, alcohol moderation, and smoking cessation.;Monitor prescription use compliance.    Expected Outcomes  Short Term: Continued assessment and intervention until BP is < 140/4m HG in hypertensive participants. < 130/814mHG in hypertensive participants with diabetes, heart failure or chronic kidney disease.;Long Term: Maintenance of blood pressure at goal levels.       Core Components/Risk Factors/Patient Goals Review:  Goals and Risk Factor Review    Row Name 01/17/18 1220 02/13/18 1144 03/06/18 1038         Core Components/Risk Factors/Patient Goals Review   Personal Goals Review  Weight Management/Obesity;Improve shortness of breath with ADL's;Hypertension  Weight Management/Obesity;Improve shortness of breath with ADL's;Hypertension  Weight Management/Obesity;Improve shortness of breath with ADL's;Hypertension     Review  Pt is off to a good start in pulmonary rehab.  Pt weight remains the same however pt has only attended 3 exercise sessions.  Pt is learning PLB and diaphragmatic  breathing techniques.  Pt needs cues but is able to demonstrate appropriately.  Pt o2 sat do hoover in the upper 80's when ambulating on the track. Pt with continued elevation in BP Pre exercise despite compliance with his medications amd diet. However his BP post exercise is generally lower than pre.  I anticipate that with continued participation in the pulmonary rehab  program  toward meating his goals.    Progressing well, would like to see a little more effort, second time through program, 1.0 on airdyne, 16 laps on track, level 4 on nustep  Pt has completed 13 exercise sessions. Progressing well, Rehab staff are seeing more effort, second time through program. Pt workloads show  1.0 on airdyne, 16 laps on track, level 4 on nustep.  Pt shows .9 kg decrease since begining the program.  Pt finds it difficult to engage in diaphragmatic breathing due to large abdominal area.     Expected Outcomes  -  see admission outcomes/goals  see admission outcomes/goals        Core Components/Risk Factors/Patient Goals at Discharge (Final Review):  Goals and Risk Factor Review - 03/06/18 1038      Core Components/Risk Factors/Patient Goals Review   Personal Goals Review  Weight Management/Obesity;Improve shortness of breath with ADL's;Hypertension    Review  Pt has completed 13 exercise sessions. Progressing well, Rehab staff are seeing more effort, second time through program. Pt workloads show  1.0 on airdyne, 16 laps on track, level 4 on nustep.  Pt shows .9 kg decrease since begining the program.  Pt finds it difficult to engage in diaphragmatic breathing due to large abdominal area.    Expected Outcomes  see admission outcomes/goals       ITP Comments: ITP Comments    Row Name 12/29/17 1004 01/17/18 1220 03/06/18 1038       ITP Comments  Dr. Jennet Maduro, Medical Director  Dr. Jennet Maduro, Medical Director  Dr. Jennet Maduro, Medical Director        Comments:  Pt has completed 13 sessions of  exercise.  Pt has [place] authorization to 04/01/18. Cherre Huger, BSN Cardiac and Training and development officer

## 2018-03-06 NOTE — Progress Notes (Signed)
Daily Session Note  Patient Details  Name: [name] MRN: [id number] Date of Birth: [birth year] Referring Provider:     Pulmonary Rehab Walk Test from 01/02/2018 in [place]  Referring Provider  Dr. Gwenette Greet      Encounter Date: 03/06/2018  Check In: Session Check In - 03/06/18 1330      Check-In   Location  [facility]    Staff Present  Rosebud Poles, RN, BSN;Carlette Carlton, RN, BSN;Molly DiVincenzo, MS, ACSM RCEP, Exercise Physiologist;Lisa Ysidro Evert, Felipe Drone, RN, [facility]    Supervising physician immediately available to respond to emergencies  [facility] immediately available    Physician(s)  Dr. Denton Brick    Medication changes reported      No    Fall or balance concerns reported     No    Tobacco Cessation  No Change    Warm-up and Cool-down  Performed as group-led instruction    Resistance Training Performed  Yes    VAD Patient?  No    PAD/SET Patient?  No      Pain Assessment   Currently in Pain?  No/denies    Multiple Pain Sites  No       Capillary Blood Glucose: No results found for this or any previous visit (from the past 24 hour(s)).  Exercise Prescription Changes - 03/06/18 1600      Response to Exercise   Blood Pressure (Admit)  122/64    Blood Pressure (Exercise)  128/84    Blood Pressure (Exit)  122/76    Heart Rate (Admit)  85 bpm    Heart Rate (Exercise)  102 bpm    Heart Rate (Exit)  82 bpm    Oxygen Saturation (Admit)  90 %    Oxygen Saturation (Exercise)  88 %    Oxygen Saturation (Exit)  91 %    Rating of Perceived Exertion (Exercise)  14    Perceived Dyspnea (Exercise)  3    Duration  Progress to 45 minutes of aerobic exercise without signs/symptoms of physical distress    Intensity  THRR unchanged      Progression   Progression  Continue to progress workloads to maintain intensity without signs/symptoms of physical distress.      Resistance Training   Training Prescription   Yes    Weight  blue bands    Reps  10-15 pt does 10 minutes of strength training    Time  10 Minutes      Interval Training   Interval Training  No      Bike   Level  1    Minutes  17      NuStep   Level  5    SPM  80    Minutes  17    METs  3      Track   Laps  13    Minutes  17       Social History   Tobacco Use  Smoking Status Former Smoker  . Types: Cigarettes  . Last attempt to quit: 06/25/2013  . Years since quitting: 4.6  Smokeless Tobacco Former Systems developer  . Quit date: 06/25/1976    Goals Met:  Exercise tolerated well Strength training completed today  Goals Unmet:  Not Applicable  Comments: Service time is from 1330 to 1500.    Dr. Rush Farmer is Medical Director for Pulmonary Rehab at [facility].

## 2018-03-07 ENCOUNTER — Ambulatory Visit: Payer: No Typology Code available for payment source | Attending: Family Medicine

## 2018-03-07 ENCOUNTER — Other Ambulatory Visit: Payer: Self-pay

## 2018-03-07 DIAGNOSIS — M545 Low back pain: Secondary | ICD-10-CM | POA: Insufficient documentation

## 2018-03-07 DIAGNOSIS — M6281 Muscle weakness (generalized): Secondary | ICD-10-CM | POA: Diagnosis not present

## 2018-03-07 DIAGNOSIS — M62830 Muscle spasm of back: Secondary | ICD-10-CM | POA: Insufficient documentation

## 2018-03-07 DIAGNOSIS — R293 Abnormal posture: Secondary | ICD-10-CM | POA: Diagnosis present

## 2018-03-07 NOTE — Therapy (Signed)
[name], [address] Phone: [phone]   Fax:  [phone]  Physical Therapy Evaluation  Patient Details  Name: [name] MRN: [id number] Date of Birth: 05-03-1951 Referring Provider: Rolla Etienne  PA-C   Encounter Date: 03/07/2018  PT End of Session - 03/07/18 1015    Visit Number  1    Number of Visits  15    Date for PT Re-Evaluation  04/27/18    Authorization Type  [place]    Authorization Time Period  progress visit 10       PT Start Time  0930    PT Stop Time  1014    PT Time Calculation (min)  44 min    Activity Tolerance  Patient tolerated treatment well;No increased pain    Behavior During Therapy  WFL for tasks assessed/performed       Past Medical History:  Diagnosis Date  . Arthritis   . COPD (chronic obstructive pulmonary disease) ([place])   . Hypertension     No past surgical history on file.  There were no vitals filed for this visit.   Subjective Assessment - 03/07/18 0934    Subjective  He reports LBP and Leg pain.  Sciatica.   Walking causes LT hip pain and numbnes into LT leg whole lower LT leg and calf tight.   Meds don't help.  No history of LBP. He had some issues with LT leg exer in class but no numbness with this activity    Pertinent History  COPD.     Limitations  Walking    How long can you sit comfortably?  As needed    How long can you stand comfortably?  As needed    How long can you walk comfortably?  5 min     Diagnostic tests  xray; negative  no MRI/      Patient Stated Goals  He wants to alleviate the pain.     Currently in Pain?  No/denies    Pain Score  9  after walk in respiratory rehab.      Pain Location  Hip    Pain Orientation  Left;Lateral    Pain Descriptors / Indicators  Numbness;Aching    Pain Type  Acute pain    Pain Radiating Towards  Lt lower leg    Pain Onset  More than a month ago    Pain Frequency  Intermittent    Aggravating  Factors   walking     Pain Relieving Factors  sit /rest         [facility] PT Assessment - 03/07/18 0001      Assessment   Medical Diagnosis  Lower back pain    Referring Provider  Rolla Etienne  PA-C    Onset Date/Surgical Date  -- a month ago    Next MD Visit  PRN    Prior Therapy  Not for back      Precautions   Precautions  None      Restrictions   Weight Bearing Restrictions  No      Balance Screen   Has the patient fallen in the past 6 months  Yes    How many times?  1 loose step on deck stepped on this    Has the patient had a decrease in activity level because of a fear of falling?   No    Is the patient reluctant to leave their home because of a  fear of falling?   No      Prior Function   Level of Independence  Independent    Vocation  Retired      Charity fundraiser Status  Within Functional Limits for tasks assessed      ROM / Strength   AROM / PROM / Strength  AROM;PROM;Strength      AROM   AROM Assessment Site  Lumbar    Lumbar Flexion  60    Lumbar Extension  15    Lumbar - Right Side Bend  15    Lumbar - Left Side Bend  15      PROM   Overall PROM Comments  Decr hip ER to 50 degrees bilateral   no pain        Flexibility   Soft Tissue Assessment /Muscle Length  yes    Hamstrings  45 bilaterally                Objective measurements completed on examination: See above findings.                PT Short Term Goals - 03/07/18 1203      PT SHORT TERM GOAL #1   Title  He will be independent with iniital HEP    Time  3    Period  Weeks    Status  New      PT SHORT TERM GOAL #2   Title  He will report soreness in spine to palpation decr 30% or more    Time  3    Period  Weeks    Status  New      PT SHORT TERM GOAL #3   Title  He will report able to walk for 15 min before pain in leg starts    Time  3    Period  Weeks    Status  New        PT Long Term Goals - 03/07/18 1204      PT LONG TERM GOAL #1    Title  He will report no lower leg symptoms with walking    Time  6    Period  Weeks    Status  New      PT LONG TERM GOAL #2   Title  He will report able to complete pulmonary rehab walking with 12- max LBP.     Time  6    Period  Weeks    Status  New      PT LONG TERM GOAL #3   Title  He will be independnet with all HEP issued    Time  6    Period  Weeks    Status  New             Plan - 03/07/18 1016    Clinical Impression Statement  [name] reports LT LBP with intermittant lower leg numbness with alking > 5 min.  He was tender LT lower back and along iliac crest and flank.  No tenderness in gluteals No numbness on testing . NEG SLR.   Some stiffness of LT hip with decr extension passive. This may incr Lt LB extension with walking .  He should improve with Skilled PT .     History and Personal Factors relevant to plan of care:  LT knee scope,  COPD , obesity.     Clinical Presentation  Evolving    Clinical Decision Making  Moderate  Rehab Potential  Good    PT Frequency  2x / week    PT Duration  6 weeks    PT Treatment/Interventions  Iontophoresis 4mg /ml Dexamethasone;Moist Heat;Dry needling;Manual techniques;Patient/family education;Therapeutic exercise;Ultrasound;Therapeutic activities;Taping    PT Next Visit Plan  Manual, modalities, HEP review,  Add to HEP    PT Home Exercise Plan  quatras stretch on side and child's pose,  knee to chest RT/Lt shoulder, tennis ball STW,03/07/18    Consulted and Agree with Plan of Care  Patient       Patient will benefit from skilled therapeutic intervention in order to improve the following deficits and impairments:  Pain, Postural dysfunction, Increased muscle spasms, Decreased strength, Decreased range of motion, Difficulty walking  Visit Diagnosis: Muscle weakness (generalized)  Muscle spasm of back  Abnormal posture  Acute left-sided low back pain, with sciatica presence unspecified     Problem List Patient Active  Problem List   Diagnosis Date Noted  . Emphysema lung ([place]) 12/29/2017    Darrel Hoover  PT 03/07/2018, 12:11 PM  [facility] [address] [place], [name], [address] Phone: [phone]   Fax:  [phone]  Name: [name] MRN: [id number] Date of Birth: 04-26-1951

## 2018-03-08 ENCOUNTER — Encounter (HOSPITAL_COMMUNITY)
Admission: RE | Admit: 2018-03-08 | Discharge: 2018-03-08 | Disposition: A | Discharge status: Home / Self Care | Payer: No Typology Code available for payment source | Source: Ambulatory Visit | Attending: Pulmonary Disease | Admitting: Pulmonary Disease

## 2018-03-08 DIAGNOSIS — J449 Chronic obstructive pulmonary disease, unspecified: Secondary | ICD-10-CM

## 2018-03-08 NOTE — Progress Notes (Signed)
Daily Session Note  Patient Details  Name: [name] MRN: [id number] Date of Birth: [birth year] Referring Provider:     Pulmonary Rehab Walk Test from 01/02/2018 in [place]  Referring Provider  Dr. Gwenette Greet      Encounter Date: 03/08/2018  Check In: Session Check In - 03/08/18 1402      Check-In   Supervising physician immediately available to respond to emergencies  [facility] immediately available    Physician(s)  Dr. Tawanna Solo    Location  [facility]    Staff Present  Maurice Small, RN, BSN;Molly DiVincenzo, MS, ACSM RCEP, Exercise Physiologist;Claudine Stallings Ysidro Evert, RN    Medication changes reported      No    Fall or balance concerns reported     No    Tobacco Cessation  No Change    Warm-up and Cool-down  Performed as group-led instruction    Resistance Training Performed  Yes    VAD Patient?  No    PAD/SET Patient?  No      Pain Assessment   Currently in Pain?  No/denies    Multiple Pain Sites  No       Capillary Blood Glucose: No results found for this or any previous visit (from the past 24 hour(s)).    Social History   Tobacco Use  Smoking Status Former Smoker  . Types: Cigarettes  . Last attempt to quit: 06/25/2013  . Years since quitting: 4.7  Smokeless Tobacco Former Systems developer  . Quit date: 06/25/1976    Goals Met:  Exercise tolerated well No report of cardiac concerns or symptoms Strength training completed today  Goals Unmet:  Not Applicable  Comments: Service time is from 1330 to 1535    Dr. Rush Farmer is Medical Director for Pulmonary Rehab at [facility].

## 2018-03-13 ENCOUNTER — Encounter (HOSPITAL_COMMUNITY)
Admission: RE | Admit: 2018-03-13 | Discharge: 2018-03-13 | Disposition: A | Discharge status: Home / Self Care | Payer: No Typology Code available for payment source | Source: Ambulatory Visit | Attending: Pulmonary Disease | Admitting: Pulmonary Disease

## 2018-03-13 DIAGNOSIS — J449 Chronic obstructive pulmonary disease, unspecified: Secondary | ICD-10-CM | POA: Diagnosis not present

## 2018-03-13 NOTE — Progress Notes (Signed)
Daily Session Note  Patient Details  Name: Idan Prime MRN: [id number] Date of Birth: 02-Sep-1950 Referring Provider:     Pulmonary Rehab Walk Test from 01/02/2018 in [place]  Referring Provider  Dr. Gwenette Greet      Encounter Date: 03/13/2018  Check In: Session Check In - 03/13/18 1332      Check-In   Supervising physician immediately available to respond to emergencies  [facility] immediately available    Physician(s)  Dr. Rodena Piety    Location  [facility]    Staff Present  Maurice Small, RN, BSN;Molly DiVincenzo, MS, ACSM RCEP, Exercise Physiologist;Lisa Ysidro Evert, RN    Medication changes reported      No    Fall or balance concerns reported     No    Tobacco Cessation  No Change    Warm-up and Cool-down  Performed as group-led instruction    Resistance Training Performed  Yes    VAD Patient?  No    PAD/SET Patient?  No      Pain Assessment   Currently in Pain?  No/denies    Multiple Pain Sites  No       Capillary Blood Glucose: No results found for this or any previous visit (from the past 24 hour(s)).    Social History   Tobacco Use  Smoking Status Former Smoker  . Types: Cigarettes  . Last attempt to quit: 06/25/2013  . Years since quitting: 4.7  Smokeless Tobacco Former Systems developer  . Quit date: 06/25/1976    Goals Met:  Exercise tolerated well No report of cardiac concerns or symptoms Strength training completed today  Goals Unmet:  Not Applicable  Comments: Service time is from 1330 to 1510   Dr. Rush Farmer is Medical Director for Pulmonary Rehab at [facility].

## 2018-03-15 ENCOUNTER — Encounter (HOSPITAL_COMMUNITY)
Admission: RE | Admit: 2018-03-15 | Discharge: 2018-03-15 | Disposition: A | Discharge status: Home / Self Care | Payer: No Typology Code available for payment source | Source: Ambulatory Visit | Attending: Pulmonary Disease | Admitting: Pulmonary Disease

## 2018-03-15 DIAGNOSIS — I10 Essential (primary) hypertension: Secondary | ICD-10-CM | POA: Diagnosis not present

## 2018-03-15 DIAGNOSIS — Z87891 Personal history of nicotine dependence: Secondary | ICD-10-CM | POA: Diagnosis not present

## 2018-03-15 DIAGNOSIS — J449 Chronic obstructive pulmonary disease, unspecified: Secondary | ICD-10-CM | POA: Diagnosis not present

## 2018-03-15 DIAGNOSIS — M1990 Unspecified osteoarthritis, unspecified site: Secondary | ICD-10-CM | POA: Insufficient documentation

## 2018-03-15 DIAGNOSIS — Z79899 Other long term (current) drug therapy: Secondary | ICD-10-CM | POA: Diagnosis not present

## 2018-03-15 DIAGNOSIS — Z7951 Long term (current) use of inhaled steroids: Secondary | ICD-10-CM | POA: Insufficient documentation

## 2018-03-15 NOTE — Progress Notes (Signed)
Daily Session Note  Patient Details  Name: [name] MRN: [id number] Date of Birth: 06-06-1951 Referring Provider:     Pulmonary Rehab Walk Test from 01/02/2018 in [place]  Referring Provider  Dr. Gwenette Greet      Encounter Date: 03/15/2018  Check In: Session Check In - 03/15/18 1409      Check-In   Supervising physician immediately available to respond to emergencies  [facility] immediately available    Physician(s)  Dr. Herbert Moors    Location  [facility]    Staff Present  Maurice Small, RN, BSN;Mercadez Heitman, MS, ACSM RCEP, Exercise Physiologist;Other    Medication changes reported      No    Fall or balance concerns reported     No    Tobacco Cessation  No Change    Warm-up and Cool-down  Performed as group-led instruction    Resistance Training Performed  Yes    VAD Patient?  No    PAD/SET Patient?  No      Pain Assessment   Currently in Pain?  No/denies       Capillary Blood Glucose: No results found for this or any previous visit (from the past 24 hour(s)).    Social History   Tobacco Use  Smoking Status Former Smoker  . Types: Cigarettes  . Last attempt to quit: 06/25/2013  . Years since quitting: 4.7  Smokeless Tobacco Former Systems developer  . Quit date: 06/25/1976    Goals Met:  Exercise tolerated well No report of cardiac concerns or symptoms Strength training completed today  Goals Unmet:  Not Applicable  Comments: Service time is from 1:30p to 3:30p    Dr. Rush Farmer is Medical Director for Pulmonary Rehab at [facility]

## 2018-03-16 ENCOUNTER — Encounter

## 2018-03-20 ENCOUNTER — Encounter (HOSPITAL_COMMUNITY)
Admission: RE | Admit: 2018-03-20 | Discharge: 2018-03-20 | Disposition: A | Discharge status: Home / Self Care | Payer: No Typology Code available for payment source | Source: Ambulatory Visit | Attending: Pulmonary Disease | Admitting: Pulmonary Disease

## 2018-03-20 DIAGNOSIS — J449 Chronic obstructive pulmonary disease, unspecified: Secondary | ICD-10-CM | POA: Diagnosis not present

## 2018-03-20 NOTE — Progress Notes (Signed)
Daily Session Note  Patient Details  Name: Sean [name] MRN: [id number] Date of Birth: 12-21-1950 Referring Provider:     Pulmonary Rehab Walk Test from 01/02/2018 in [facility]  Referring Provider  Dr. Gwenette Greet      Encounter Date: 03/20/2018  Check In: Session Check In - 03/20/18 1619      Check-In   Supervising physician immediately available to respond to emergencies  [facility] immediately available    Physician(s)  Dr. Reesa Chew    Location  [facility]    Staff Present  Su Hilt, MS, ACSM RCEP, Exercise Physiologist;Areeb Corron Ysidro Evert, RN;Carlette Wilber Oliphant, RN, BSN;Ramon Dredge, RN, MHA    Medication changes reported      No    Fall or balance concerns reported     No    Tobacco Cessation  No Change    Warm-up and Cool-down  Performed as group-led instruction    Resistance Training Performed  Yes    VAD Patient?  No    PAD/SET Patient?  No      Pain Assessment   Currently in Pain?  No/denies       Capillary Blood Glucose: No results found for this or any previous visit (from the past 24 hour(s)).  Exercise Prescription Changes - 03/20/18 1600      Response to Exercise   Blood Pressure (Admit)  132/80    Blood Pressure (Exercise)  158/78    Blood Pressure (Exit)  140/74    Heart Rate (Admit)  69 bpm    Heart Rate (Exercise)  99 bpm    Heart Rate (Exit)  81 bpm    Oxygen Saturation (Admit)  93 %    Oxygen Saturation (Exercise)  89 %    Oxygen Saturation (Exit)  92 %    Rating of Perceived Exertion (Exercise)  17    Perceived Dyspnea (Exercise)  2    Duration  Progress to 45 minutes of aerobic exercise without signs/symptoms of physical distress    Intensity  THRR unchanged      Progression   Progression  Continue to progress workloads to maintain intensity without signs/symptoms of physical distress.      Resistance Training   Training Prescription  Yes    Weight  blue namds    Reps  10-15    Time  10  Minutes      Interval Training   Interval Training  No      Bike   Level  1.1    Minutes  17      Track   Minutes  17       Social History   Tobacco Use  Smoking Status Former Smoker  . Types: Cigarettes  . Last attempt to quit: 06/25/2013  . Years since quitting: 4.7  Smokeless Tobacco Former Systems developer  . Quit date: 06/25/1976    Goals Met:  Exercise tolerated well No report of cardiac concerns or symptoms Strength training completed today  Goals Unmet:  Not Applicable  Comments: Service time is from 1330 to 1515    Dr. Rush Farmer is Medical Director for Pulmonary Rehab at [facility].

## 2018-03-22 ENCOUNTER — Encounter (HOSPITAL_COMMUNITY)
Admission: RE | Admit: 2018-03-22 | Discharge: 2018-03-22 | Disposition: A | Discharge status: Home / Self Care | Payer: No Typology Code available for payment source | Source: Ambulatory Visit | Attending: Pulmonary Disease | Admitting: Pulmonary Disease

## 2018-03-22 DIAGNOSIS — J449 Chronic obstructive pulmonary disease, unspecified: Secondary | ICD-10-CM | POA: Diagnosis not present

## 2018-03-22 NOTE — Progress Notes (Signed)
Daily Session Note  Patient Details  Name: Sean [name] MRN: [id number] Date of Birth: [birth year] Referring Provider:     Pulmonary Rehab Walk Test from 01/02/2018 in [facility]  Referring Provider  Dr. Gwenette Greet      Encounter Date: 03/22/2018  Check In: Session Check In - 03/22/18 1545      Check-In   Supervising physician immediately available to respond to emergencies  [facility] immediately available    Physician(s)  Dr. [place] Pink    Location  [facility]    Staff Present  Su Hilt, MS, ACSM RCEP, Exercise Physiologist;Nicola Heinemann Colletta Maryland, RN, MHA    Medication changes reported      No    Fall or balance concerns reported     No    Tobacco Cessation  No Change    Warm-up and Cool-down  Performed as group-led instruction    Resistance Training Performed  Yes    VAD Patient?  No    PAD/SET Patient?  No      Pain Assessment   Currently in Pain?  No/denies    Multiple Pain Sites  No       Capillary Blood Glucose: No results found for this or any previous visit (from the past 24 hour(s)).    Social History   Tobacco Use  Smoking Status Former Smoker  . Types: Cigarettes  . Last attempt to quit: 06/25/2013  . Years since quitting: 4.7  Smokeless Tobacco Former Systems developer  . Quit date: 06/25/1976    Goals Met:  Exercise tolerated well No report of cardiac concerns or symptoms Strength training completed today  Goals Unmet:  Not Applicable  Comments: Service time is from 1330 to 1515    Dr. Rush Farmer is Medical Director for Pulmonary Rehab at [facility].

## 2018-03-23 ENCOUNTER — Encounter

## 2018-03-23 ENCOUNTER — Encounter: Payer: Self-pay | Admitting: Physical Therapy

## 2018-03-23 ENCOUNTER — Ambulatory Visit: Payer: No Typology Code available for payment source | Attending: Family Medicine | Admitting: Physical Therapy

## 2018-03-23 VITALS — HR 100

## 2018-03-23 DIAGNOSIS — M545 Low back pain: Secondary | ICD-10-CM | POA: Insufficient documentation

## 2018-03-23 DIAGNOSIS — R293 Abnormal posture: Secondary | ICD-10-CM | POA: Insufficient documentation

## 2018-03-23 DIAGNOSIS — M62830 Muscle spasm of back: Secondary | ICD-10-CM | POA: Diagnosis present

## 2018-03-23 DIAGNOSIS — M6281 Muscle weakness (generalized): Secondary | ICD-10-CM | POA: Diagnosis present

## 2018-03-23 NOTE — Therapy (Signed)
[place], [place], [address] Phone: [phone]   Fax:  [phone]  Physical Therapy Treatment  Patient Details  Name: Sean [name] MRN: [id number] Date of Birth: August 27, 1950 Referring Provider: Rolla Etienne  PA-C   Encounter Date: 03/23/2018  PT End of Session - 03/23/18 1011    Visit Number  2    Number of Visits  15    Date for PT Re-Evaluation  04/27/18    Authorization Type  VA    Authorization Time Period  progress visit 10       PT Start Time  0930    PT Stop Time  1020    PT Time Calculation (min)  50 min    Activity Tolerance  Patient tolerated treatment well;No increased pain    Behavior During Therapy  WFL for tasks assessed/performed       Past Medical History:  Diagnosis Date  . Arthritis   . COPD (chronic obstructive pulmonary disease) ([place])   . Hypertension     History reviewed. No pertinent surgical history.  Vitals:   03/23/18 0940  Pulse: 100  SpO2: 95%    Subjective Assessment - 03/23/18 0934    Subjective  Pain increases with activity.  Walked 15 min at pulmonary rehab and by that time I'm limping.     Currently in Pain?  Yes    Pain Score  1             [facility] Adult PT Treatment/Exercise - 03/23/18 0001      Lumbar Exercises: Stretches   Active Hamstring Stretch  3 reps;30 seconds    Single Knee to Chest Stretch  3 reps;30 seconds    Lower Trunk Rotation  10 seconds    Lower Trunk Rotation Limitations  x 10     Piriformis Stretch  3 reps;20 seconds    Other Lumbar Stretch Exercise  standing QL stretch       Lumbar Exercises: Sidelying   Other Sidelying Lumbar Exercises  QL stretch with brief manual       Lumbar Exercises: Quadruped   Madcat/Old Horse  5 reps    Madcat/Old Horse Limitations  difficulty    Other Quadruped Lumbar Exercises  childs pose back and also to L side       Knee/Hip Exercises: Stretches   Press photographer  Both;3 reps;30 seconds      Knee/Hip Exercises: Aerobic   Nustep  L 5 UE and LE mod pace 5 min       Modalities   Modalities  Moist Heat      Moist Heat Therapy   Number Minutes Moist Heat  8 Minutes    Moist Heat Location  Lumbar Spine             PT Education - 03/23/18 1011    Education Details  HEP corrections and alternatives     Person(s) Educated  Patient    Methods  Explanation;Demonstration;Verbal cues;Handout    Comprehension  Verbalized understanding;Returned demonstration       PT Short Term Goals - 03/23/18 1012      PT SHORT TERM GOAL #1   Title  He will be independent with iniital HEP    Status  On-going      PT SHORT TERM GOAL #2   Title  He will report soreness in spine to palpation decr 30% or more    Status  On-going      PT SHORT  TERM GOAL #3   Title  He will report able to walk for 15 min before pain in leg starts    Status  On-going        PT Long Term Goals - 03/07/18 1204      PT LONG TERM GOAL #1   Title  He will report no lower leg symptoms with walking    Time  6    Period  Weeks    Status  New      PT LONG TERM GOAL #2   Title  He will report able to complete pulmonary rehab walking with 12- max LBP.     Time  6    Period  Weeks    Status  New      PT LONG TERM GOAL #3   Title  He will be independnet with all HEP issued    Time  6    Period  Weeks    Status  New            Plan - 03/23/18 1013    Clinical Impression Statement  Patient worked hard today modified some of his exercises for greater effectivenes and added in a lower leg stretch.  Sore and painful superior glute and L5 paraspinals.  Goals in progress.     PT Treatment/Interventions  Iontophoresis 4mg /ml Dexamethasone;Moist Heat;Dry needling;Manual techniques;Patient/family education;Therapeutic exercise;Ultrasound;Therapeutic activities;Taping    PT Next Visit Plan  Manual, modalities, HEP review,  Add to HEP    PT Home Exercise Plan  quatras stretch on side and child's pose,   knee to chest RT/Lt shoulder, tennis ball STW, LTR and calf stretch     Consulted and Agree with Plan of Care  Patient       Patient will benefit from skilled therapeutic intervention in order to improve the following deficits and impairments:  Pain, Postural dysfunction, Increased muscle spasms, Decreased strength, Decreased range of motion, Difficulty walking  Visit Diagnosis: Muscle weakness (generalized)  Muscle spasm of back  Abnormal posture  Acute left-sided low back pain, with sciatica presence unspecified     Problem List Patient Active Problem List   Diagnosis Date Noted  . Emphysema lung ([place]) 12/29/2017    Sean [name] 03/23/2018, 11:29 AM  [facility] [address] [place], [place], [address] Phone: [phone]   Fax:  [phone]  Name: Sean [name] MRN: [id number] Date of Birth: 11/19/1950  Sean [name], PT 03/23/18 11:29 AM Phone: [phone] Fax: [phone]

## 2018-03-23 NOTE — Patient Instructions (Signed)
Lumbar Rotation (Non-Weight Bearing)    Feet on floor, slowly rock knees from side to side in small, pain-free range of motion. Allow lower back to rotate slightly. Repeat ___10_ times per set. Do ___1_ sets per session. Do __2__ sessions per day.  http://orth.exer.us/160   Copyright  VHI. All rights reserved.   Calf / Gastoc: Runners' Stretch I    One leg back and straight, other forward and bent supporting weight, lean forward, gently stretching calf of back leg. Hold __30__ seconds. Repeat with other leg. Repeat __3__ times. Do __2__ sessions per day.  Copyright  VHI. All rights reserved.

## 2018-03-26 ENCOUNTER — Ambulatory Visit: Payer: No Typology Code available for payment source

## 2018-03-26 DIAGNOSIS — M62830 Muscle spasm of back: Secondary | ICD-10-CM

## 2018-03-26 DIAGNOSIS — R293 Abnormal posture: Secondary | ICD-10-CM

## 2018-03-26 DIAGNOSIS — M6281 Muscle weakness (generalized): Secondary | ICD-10-CM | POA: Diagnosis not present

## 2018-03-26 DIAGNOSIS — M545 Low back pain: Secondary | ICD-10-CM

## 2018-03-26 NOTE — Therapy (Signed)
[place], [place], [address] Phone: [phone]   Fax:  [phone]  Physical Therapy Treatment  Patient Details  Name: Sean [name] MRN: [id number] Date of Birth: 11-06-1950 Referring Provider: Rolla Etienne  PA-C   Encounter Date: 03/26/2018  PT End of Session - 03/26/18 1233    Visit Number  3    Number of Visits  15    Date for PT Re-Evaluation  04/27/18    Authorization Type  VA    Authorization Time Period  progress visit 10       Authorization - Visit Number  3    Authorization - Number of Visits  15    PT Start Time  4854    PT Stop Time  6270    PT Time Calculation (min)  45 min    Activity Tolerance  Patient tolerated treatment well;No increased pain    Behavior During Therapy  WFL for tasks assessed/performed       Past Medical History:  Diagnosis Date  . Arthritis   . COPD (chronic obstructive pulmonary disease) ([place])   . Hypertension     History reviewed. No pertinent surgical history.  There were no vitals filed for this visit.  Subjective Assessment - 03/26/18 1235    Subjective  A little leg pain . Was in silver sneaker clas with  leg exer with standing so leg pain started.     Pain Score  1     Pain Location  Hip    Pain Orientation  Left    Pain Descriptors / Indicators  Aching;Numbness    Pain Onset  More than a month ago    Pain Frequency  Intermittent                       [facility] Adult PT Treatment/Exercise - 03/26/18 0001      Lumbar Exercises: Stretches   Active Hamstring Stretch  Right;Left;1 rep;30 seconds    Single Knee to Chest Stretch  Right;Left;1 rep;30 seconds    Lower Trunk Rotation  10 seconds    Lower Trunk Rotation Limitations  x 10     Piriformis Stretch  Right;Left;2 reps;30 seconds      Lumbar Exercises: Supine   Pelvic Tilt  10 reps;5 seconds    Bent Knee Raise  10 reps    Bent Knee Raise Limitations  RT/LT     Bridge  15 reps     Other Supine Lumbar Exercises  ball squeeze with ab set x 5 sec then green band with ab set.       Lumbar Exercises: Sidelying   Other Sidelying Lumbar Exercises  QL stretch with  manual STW to QL and PA glides Gr 3-4 L1-5   RT/LT      Knee/Hip Exercises: Aerobic   Nustep  L 5 UE and LE mod pace 5 min       Moist Heat Therapy   Number Minutes Moist Heat  14 Minutes    Moist Heat Location  Lumbar Spine   done with exercise and end of session            PT Education - 03/26/18 1315    Education Details  RE instructed how to use ball for STW    Person(s) Educated  Patient    Methods  Explanation;Verbal cues    Comprehension  Returned demonstration;Verbalized understanding       PT Short Term Goals -  03/23/18 1012      PT SHORT TERM GOAL #1   Title  He will be independent with iniital HEP    Status  On-going      PT SHORT TERM GOAL #2   Title  He will report soreness in spine to palpation decr 30% or more    Status  On-going      PT SHORT TERM GOAL #3   Title  He will report able to walk for 15 min before pain in leg starts    Status  On-going        PT Long Term Goals - 03/07/18 1204      PT LONG TERM GOAL #1   Title  He will report no lower leg symptoms with walking    Time  6    Period  Weeks    Status  New      PT LONG TERM GOAL #2   Title  He will report able to complete pulmonary rehab walking with 12- max LBP.     Time  6    Period  Weeks    Status  New      PT LONG TERM GOAL #3   Title  He will be independnet with all HEP issued    Time  6    Period  Weeks    Status  New            Plan - 03/26/18 1234    Clinical Impression Statement  Able to do all exercise without pain . Will add core stength next visit. for HEP .    No painon leaving    PT Treatment/Interventions  Iontophoresis 4mg /ml Dexamethasone;Moist Heat;Dry needling;Manual techniques;Patient/family education;Therapeutic exercise;Ultrasound;Therapeutic activities;Taping    PT  Next Visit Plan  Manual, modalities, HEP review,  Add to HEP    PT Home Exercise Plan  quatras stretch on side and child's pose,  knee to chest RT/Lt shoulder, tennis ball STW, LTR and calf stretch     Consulted and Agree with Plan of Care  Patient       Patient will benefit from skilled therapeutic intervention in order to improve the following deficits and impairments:  Pain, Postural dysfunction, Increased muscle spasms, Decreased strength, Decreased range of motion, Difficulty walking  Visit Diagnosis: Muscle weakness (generalized)  Muscle spasm of back  Abnormal posture  Acute left-sided low back pain, with sciatica presence unspecified     Problem List Patient Active Problem List   Diagnosis Date Noted  . Emphysema lung ([place]) 12/29/2017    Darrel Hoover  PT 03/26/2018, 1:18 PM  [facility] [address] [place], [place], [address] Phone: [phone]   Fax:  [phone]  Name: Sean [name] MRN: [id number] Date of Birth: 06-05-1951

## 2018-03-27 ENCOUNTER — Encounter (HOSPITAL_COMMUNITY)
Admission: RE | Admit: 2018-03-27 | Discharge: 2018-03-27 | Disposition: A | Discharge status: Home / Self Care | Payer: No Typology Code available for payment source | Source: Ambulatory Visit | Attending: Pulmonary Disease | Admitting: Pulmonary Disease

## 2018-03-27 DIAGNOSIS — J449 Chronic obstructive pulmonary disease, unspecified: Secondary | ICD-10-CM | POA: Diagnosis not present

## 2018-03-27 NOTE — Progress Notes (Signed)
Daily Session Note  Patient Details  Name: Sean [name] MRN: [id number] Date of Birth: [birth year] Referring Provider:     Pulmonary Rehab Walk Test from 01/02/2018 in [facility]  Referring Provider  Dr. Gwenette Greet      Encounter Date: 03/27/2018  Check In: Session Check In - 03/27/18 1526      Check-In   Supervising physician immediately available to respond to emergencies  [facility] immediately available    Physician(s)  Dr. Florene Glen    Location  [facility]    Staff Present  Maurice Small, RN, BSN;Molly DiVincenzo, MS, ACSM RCEP, Exercise Physiologist;Nashla Althoff Ysidro Evert, RN    Medication changes reported      No    Fall or balance concerns reported     No    Tobacco Cessation  No Change    Resistance Training Performed  Yes    VAD Patient?  No    PAD/SET Patient?  No      Pain Assessment   Currently in Pain?  No/denies    Multiple Pain Sites  No       Capillary Blood Glucose: No results found for this or any previous visit (from the past 24 hour(s)).    Social History   Tobacco Use  Smoking Status Former Smoker  . Types: Cigarettes  . Last attempt to quit: 06/25/2013  . Years since quitting: 4.7  Smokeless Tobacco Former Systems developer  . Quit date: 06/25/1976    Goals Met:  Exercise tolerated well No report of cardiac concerns or symptoms Strength training completed today  Goals Unmet:  Not Applicable  Comments: Service time is from 1330 to 1500    Dr. Rush Farmer is Medical Director for Pulmonary Rehab at [facility].

## 2018-03-28 ENCOUNTER — Ambulatory Visit: Payer: No Typology Code available for payment source | Admitting: Physical Therapy

## 2018-03-28 ENCOUNTER — Encounter: Payer: Self-pay | Admitting: Physical Therapy

## 2018-03-28 DIAGNOSIS — M6281 Muscle weakness (generalized): Secondary | ICD-10-CM | POA: Diagnosis not present

## 2018-03-28 DIAGNOSIS — M62830 Muscle spasm of back: Secondary | ICD-10-CM

## 2018-03-28 DIAGNOSIS — M545 Low back pain: Secondary | ICD-10-CM

## 2018-03-28 DIAGNOSIS — R293 Abnormal posture: Secondary | ICD-10-CM

## 2018-03-28 NOTE — Therapy (Addendum)
[place], [place], [address] Phone: [phone]   Fax:  [phone]  Physical Therapy Treatment  Patient Details  Name: Sean [name] MRN: [id number] Date of Birth: 22-Apr-1951 Referring Provider: Rolla Etienne  PA-C   Encounter Date: 03/28/2018  PT End of Session - 03/28/18 1221    Visit Number  4    Number of Visits  15    Date for PT Re-Evaluation  04/27/18    Authorization Type  VA    Authorization Time Period  progress visit 10       Authorization - Visit Number  4    Authorization - Number of Visits  15    PT Start Time  9702    PT Stop Time  1230    PT Time Calculation (min)  42 min    Activity Tolerance  Patient tolerated treatment well    Behavior During Therapy  [facility] for tasks assessed/performed       Past Medical History:  Diagnosis Date  . Arthritis   . COPD (chronic obstructive pulmonary disease) ([place])   . Hypertension     History reviewed. No pertinent surgical history.  There were no vitals filed for this visit.  Subjective Assessment - 03/28/18 1150    Subjective  "I feel pretty good. My hip and knee hurt."    Currently in Pain?  Yes    Pain Score  2     Pain Location  Hip    Pain Orientation  Left    Pain Descriptors / Indicators  Aching;Numbness    Pain Type  Acute pain    Pain Radiating Towards  radiates into L lower leg    Pain Onset  More than a month ago    Pain Frequency  Intermittent    Aggravating Factors   walking     Pain Relieving Factors  sit/rest, exercises                       [facility] Adult PT Treatment/Exercise - 03/28/18 0001      Lumbar Exercises: Stretches   Single Knee to Chest Stretch  Right;Left;1 rep;30 seconds    Lower Trunk Rotation Limitations  10 x 5 sec      Lumbar Exercises: Supine   Pelvic Tilt  10 reps;5 seconds   cues to breath   Bridge  15 reps    Other Supine Lumbar Exercises  clams with red theraband x 15 reps      Knee/Hip Exercises: Aerobic   Nustep  L4 x 6 min UE and LE      Manual Therapy   Manual Therapy  Soft tissue mobilization    Manual therapy comments  skilled palpation during TPDN    Soft tissue mobilization  IASTM L glute med       Trigger Point Dry Needling - 03/28/18 1233    Consent Given?  Yes    Education Handout Provided  No    Muscles Treated Lower Body  Gluteus minimus   glute med L   Gluteus Minimus Response  Twitch response elicited;Palpable increased muscle length           PT Education - 03/28/18 1226    Education Details  TPDN benefits, IASTM to further improve blood flow    Person(s) Educated  Patient    Methods  Explanation    Comprehension  Verbalized understanding       PT Short Term Goals -  03/23/18 1012      PT SHORT TERM GOAL #1   Title  He will be independent with iniital HEP    Status  On-going      PT SHORT TERM GOAL #2   Title  He will report soreness in spine to palpation decr 30% or more    Status  On-going      PT SHORT TERM GOAL #3   Title  He will report able to walk for 15 min before pain in leg starts    Status  On-going        PT Long Term Goals - 03/07/18 1204      PT LONG TERM GOAL #1   Title  He will report no lower leg symptoms with walking    Time  6    Period  Weeks    Status  New      PT LONG TERM GOAL #2   Title  He will report able to complete pulmonary rehab walking with 12- max LBP.     Time  6    Period  Weeks    Status  New      PT LONG TERM GOAL #3   Title  He will be independnet with all HEP issued    Time  6    Period  Weeks    Status  New            Plan - 03/28/18 1222    Clinical Impression Statement  TPDN performed today on L glute med; pt responded well. IASTM performed following dry needling. Followed manual therapy with Nustep, stretching, and core and hip strengthening. Pt tolerated treatment well but needs reminders not to hold breath when performing pelvic tilts.    PT  Treatment/Interventions  Iontophoresis 4mg /ml Dexamethasone;Moist Heat;Dry needling;Manual techniques;Patient/family education;Therapeutic exercise;Ultrasound;Therapeutic activities;Taping    PT Next Visit Plan  Manual, modalities, HEP review, Add to HEP, core and hip strengthening    PT Home Exercise Plan  quatras stretch on side and child's pose,  knee to chest RT/Lt shoulder, tennis ball STW, LTR and calf stretch     Consulted and Agree with Plan of Care  Patient       Patient will benefit from skilled therapeutic intervention in order to improve the following deficits and impairments:  Pain, Postural dysfunction, Increased muscle spasms, Decreased strength, Decreased range of motion, Difficulty walking  Visit Diagnosis: Muscle weakness (generalized)  Muscle spasm of back  Abnormal posture  Acute left-sided low back pain, with sciatica presence unspecified     Problem List Patient Active Problem List   Diagnosis Date Noted  . Emphysema lung ([place]) 12/29/2017   Worthy Flank, SPT 03/28/18 12:51 PM   [facility] [address] [place], [place], [address] Phone: [phone]   Fax:  [phone]  Name: Jenaro Souder MRN: [id number] Date of Birth: December 10, 1950

## 2018-03-29 ENCOUNTER — Encounter (HOSPITAL_COMMUNITY)
Admission: RE | Admit: 2018-03-29 | Discharge: 2018-03-29 | Disposition: A | Discharge status: Home / Self Care | Payer: No Typology Code available for payment source | Source: Ambulatory Visit | Attending: Pulmonary Disease | Admitting: Pulmonary Disease

## 2018-03-29 DIAGNOSIS — J449 Chronic obstructive pulmonary disease, unspecified: Secondary | ICD-10-CM | POA: Diagnosis not present

## 2018-03-29 NOTE — Progress Notes (Signed)
Daily Session Note  Patient Details  Name: Sean [name] MRN: [id number] Date of Birth: [birth year] Referring Provider:     Pulmonary Rehab Walk Test from 01/02/2018 in [place]  Referring Provider  Dr. Gwenette Greet      Encounter Date: 03/29/2018  Check In: Session Check In - 03/29/18 1620      Check-In   Supervising physician immediately available to respond to emergencies  [facility] immediately available    Physician(s)  Dr. Florene Glen    Location  [facility]    Staff Present  Maurice Small, RN, BSN;Ransom Nickson, MS, ACSM RCEP, Exercise Physiologist;Annedrea Stackhouse, RN, [place]    Medication changes reported      No    Fall or balance concerns reported     No    Tobacco Cessation  No Change    Warm-up and Cool-down  Performed as group-led instruction    Resistance Training Performed  Yes    VAD Patient?  No    PAD/SET Patient?  No      Pain Assessment   Currently in Pain?  No/denies    Multiple Pain Sites  No       Capillary Blood Glucose: No results found for this or any previous visit (from the past 24 hour(s)).    Social History   Tobacco Use  Smoking Status Former Smoker  . Types: Cigarettes  . Last attempt to quit: 06/25/2013  . Years since quitting: 4.7  Smokeless Tobacco Former Systems developer  . Quit date: 06/25/1976    Goals Met:  Exercise tolerated well Personal goals reviewed  Goals Unmet:  Not Applicable  Comments: Service time is from 1:30p to 3:30p    Dr. Rush Farmer is Medical Director for Pulmonary Rehab at [facility].

## 2018-04-02 ENCOUNTER — Ambulatory Visit: Payer: No Typology Code available for payment source

## 2018-04-02 DIAGNOSIS — R293 Abnormal posture: Secondary | ICD-10-CM

## 2018-04-02 DIAGNOSIS — M6281 Muscle weakness (generalized): Secondary | ICD-10-CM | POA: Diagnosis not present

## 2018-04-02 DIAGNOSIS — M62830 Muscle spasm of back: Secondary | ICD-10-CM

## 2018-04-02 DIAGNOSIS — M545 Low back pain: Secondary | ICD-10-CM

## 2018-04-02 NOTE — Therapy (Signed)
[place], [place], [address] Phone: [phone]   Fax:  [phone]  Physical Therapy Treatment  Patient Details  Name: Sean [name] MRN: [id number] Date of Birth: 10/26/1950 Referring Provider: Rolla Etienne  PA-C   Encounter Date: 04/02/2018  PT End of Session - 04/02/18 1148    Visit Number  5    Number of Visits  15    Date for PT Re-Evaluation  04/27/18    Authorization Type  [facility]    Authorization Time Period  progress visit 10       Authorization - Visit Number  5    Authorization - Number of Visits  15    PT Start Time  8563    PT Stop Time  1235    PT Time Calculation (min)  50 min    Activity Tolerance  Patient tolerated treatment well    Behavior During Therapy  [facility] for tasks assessed/performed       Past Medical History:  Diagnosis Date  . Arthritis   . COPD (chronic obstructive pulmonary disease) ([place])   . Hypertension     History reviewed. No pertinent surgical history.  There were no vitals filed for this visit.  Subjective Assessment - 04/02/18 1149    Subjective  "I feel pretty good. My hip and knee hurt. some but  burning /squeezing sensation in LT lower leg gone today.     Pain Score  2     Pain Location  Hip    Pain Orientation  Left    Pain Descriptors / Indicators  Aching    Pain Type  Acute pain    Pain Onset  More than a month ago    Pain Frequency  Intermittent    Aggravating Factors   walk    Pain Relieving Factors  rest/sit                       [facility] Adult PT Treatment/Exercise - 04/02/18 0001      Lumbar Exercises: Supine   Pelvic Tilt  10 reps   cued to exhale with contraction   Bent Knee Raise  10 reps    Bridge  15 reps   with ab set   Other Supine Lumbar Exercises  clams with green  theraband x 15 reps with ab set      Knee/Hip Exercises: Aerobic   Nustep  L4 x 8 min UE and LE      Moist Heat Therapy   Number Minutes Moist Heat  10  Minutes    Moist Heat Location  Hip   LT     Manual Therapy   Soft tissue mobilization  IASTM L glute med               PT Short Term Goals - 04/02/18 1227      PT SHORT TERM GOAL #1   Title  He will be independent with iniital HEP    Status  Achieved      PT SHORT TERM GOAL #2   Title  He will report soreness in spine to palpation decr 30% or more    Status  Achieved      PT SHORT TERM GOAL #3   Title  He will report able to walk for 15 min before pain in leg starts    Status  On-going        PT Long Term Goals - 03/07/18 1204  PT LONG TERM GOAL #1   Title  He will report no lower leg symptoms with walking    Time  6    Period  Weeks    Status  New      PT LONG TERM GOAL #2   Title  He will report able to complete pulmonary rehab walking with 12- max LBP.     Time  6    Period  Weeks    Status  New      PT LONG TERM GOAL #3   Title  He will be independnet with all HEP issued    Time  6    Period  Weeks    Status  New            Plan - 04/02/18 1148    Clinical Impression Statement  Some of left leg symptoms resolved for now. Tender along iliac crest Lt .   Still needs cuing for breathing.  Progressing . DN as able in future visits    PT Treatment/Interventions  Iontophoresis 4mg /ml Dexamethasone;Moist Heat;Dry needling;Manual techniques;Patient/family education;Therapeutic exercise;Ultrasound;Therapeutic activities;Taping    PT Next Visit Plan  Manual, modalities, HEP review, Add to HEP, core and hip strengthening    PT Home Exercise Plan  quadratus stretch on side and child's pose,  knee to chest RT/Lt shoulder, tennis ball STW, LTR and calf stretch     Consulted and Agree with Plan of Care  Patient       Patient will benefit from skilled therapeutic intervention in order to improve the following deficits and impairments:  Pain, Postural dysfunction, Increased muscle spasms, Decreased strength, Decreased range of motion, Difficulty  walking  Visit Diagnosis: Muscle weakness (generalized)  Muscle spasm of back  Abnormal posture  Acute left-sided low back pain, with sciatica presence unspecified     Problem List Patient Active Problem List   Diagnosis Date Noted  . Emphysema lung ([place]) 12/29/2017    Darrel Hoover  PT 04/02/2018, 12:29 PM  [facility] [address] [place], [place], [address] Phone: [phone]   Fax:  [phone]  Name: Sean [name] MRN: [id number] Date of Birth: May 12, 1951

## 2018-04-03 ENCOUNTER — Encounter (HOSPITAL_COMMUNITY)
Admission: RE | Admit: 2018-04-03 | Discharge: 2018-04-03 | Disposition: A | Discharge status: Home / Self Care | Payer: No Typology Code available for payment source | Source: Ambulatory Visit | Attending: Pulmonary Disease | Admitting: Pulmonary Disease

## 2018-04-03 VITALS — Wt 201.1 lb

## 2018-04-03 DIAGNOSIS — J449 Chronic obstructive pulmonary disease, unspecified: Secondary | ICD-10-CM | POA: Diagnosis not present

## 2018-04-03 NOTE — Progress Notes (Signed)
Daily Session Note  Patient Details  Name: Sean [name] MRN: [id number] Date of Birth: [birth year] Referring Provider:     Pulmonary Rehab Walk Test from 01/02/2018 in [place]  Referring Provider  Dr. Gwenette Greet      Encounter Date: 04/03/2018  Check In: Session Check In - 04/03/18 1548      Check-In   Supervising physician immediately available to respond to emergencies  [facility] immediately available    Physician(s)  Dr. Florene Glen    Location  [facility]    Staff Present  Maurice Small, RN, BSN;Molly DiVincenzo, MS, ACSM RCEP, Exercise Physiologist;Annedrea Rosezella [place], RN, MHA;Olinty [place], MS, ACSM CEP, Exercise Physiologist    Medication changes reported      No    Fall or balance concerns reported     No    Tobacco Cessation  No Change    Warm-up and Cool-down  Performed as group-led instruction    Resistance Training Performed  Yes    VAD Patient?  No    PAD/SET Patient?  No      Pain Assessment   Currently in Pain?  No/denies    Multiple Pain Sites  No       Capillary Blood Glucose: No results found for this or any previous visit (from the past 24 hour(s)).  Exercise Prescription Changes - 04/03/18 1500      Response to Exercise   Blood Pressure (Admit)  126/70    Blood Pressure (Exercise)  142/70    Blood Pressure (Exit)  126/78    Heart Rate (Admit)  77 bpm    Heart Rate (Exercise)  98 bpm    Heart Rate (Exit)  82 bpm    Oxygen Saturation (Admit)  93 %    Oxygen Saturation (Exercise)  89 %    Oxygen Saturation (Exit)  91 %    Rating of Perceived Exertion (Exercise)  15    Perceived Dyspnea (Exercise)  3    Duration  Continue with 45 min of aerobic exercise without signs/symptoms of physical distress.    Intensity  THRR unchanged      Progression   Progression  Continue to progress workloads to maintain intensity without signs/symptoms of physical distress.      Resistance Training   Training  Prescription  No    Weight  blue bands    Reps  10-15    Time  10 Minutes      Interval Training   Interval Training  No      Bike   Level  1.1    Minutes  17      NuStep   Level  6    SPM  80    Minutes  17    METs  3.7      Track   Laps  9    Minutes  17       Social History   Tobacco Use  Smoking Status Former Smoker  . Types: Cigarettes  . Last attempt to quit: 06/25/2013  . Years since quitting: 4.7  Smokeless Tobacco Former Systems developer  . Quit date: 06/25/1976    Goals Met:  Exercise tolerated well No report of cardiac concerns or symptoms Strength training completed today  Goals Unmet:  Not Applicable  Comments: Service time is from 1330 to 1510    Dr. Rush Farmer is Medical Director for Pulmonary Rehab at [facility].

## 2018-04-04 ENCOUNTER — Ambulatory Visit: Payer: No Typology Code available for payment source | Admitting: Physical Therapy

## 2018-04-04 ENCOUNTER — Encounter: Payer: Self-pay | Admitting: Physical Therapy

## 2018-04-04 DIAGNOSIS — M6281 Muscle weakness (generalized): Secondary | ICD-10-CM | POA: Diagnosis not present

## 2018-04-04 DIAGNOSIS — M545 Low back pain: Secondary | ICD-10-CM

## 2018-04-04 DIAGNOSIS — R293 Abnormal posture: Secondary | ICD-10-CM

## 2018-04-04 DIAGNOSIS — M62830 Muscle spasm of back: Secondary | ICD-10-CM

## 2018-04-04 NOTE — Therapy (Signed)
[place], [place], [address] Phone: [phone]   Fax:  [phone]  Physical Therapy Treatment  Patient Details  Name: Sean [name] MRN: [id number] Date of Birth: 1950-11-04 Referring Provider: Rolla Etienne  PA-C   Encounter Date: 04/04/2018  PT End of Session - 04/04/18 1233    Visit Number  6    Number of Visits  15    Date for PT Re-Evaluation  04/27/18    Authorization Type  [facility]    Authorization - Visit Number  6    Authorization - Number of Visits  15    PT Start Time  2297    PT Stop Time  1229    PT Time Calculation (min)  43 min    Activity Tolerance  Patient tolerated treatment well    Behavior During Therapy  [facility] for tasks assessed/performed       Past Medical History:  Diagnosis Date  . Arthritis   . COPD (chronic obstructive pulmonary disease) ([place])   . Hypertension     History reviewed. No pertinent surgical history.  There were no vitals filed for this visit.  Subjective Assessment - 04/04/18 1152    Subjective  " I felt like it was alittle sore after the last DN session but it really helped. Soreness does seems to come back alittle"     Currently in Pain?  Yes    Pain Score  1     Pain Orientation  Left    Pain Descriptors / Indicators  Sore    Pain Type  Acute pain    Pain Onset  More than a month ago    Pain Frequency  Intermittent                       [facility] Adult PT Treatment/Exercise - 04/04/18 0001      Lumbar Exercises: Stretches   Other Lumbar Stretch Exercise  glute med stretching in R sidelying 2 x 30 sec      Lumbar Exercises: Supine   Bridge  15 reps    Other Supine Lumbar Exercises  clams with green  theraband x 15 reps with ab set x  2 sets      Knee/Hip Exercises: Aerobic   Nustep  L5 x 5 min LE only      Knee/Hip Exercises: Seated   Sit to Sand  2 sets;10 reps   with green band around the knees      Trigger Point Dry Needling -  04/04/18 1155    Consent Given?  Yes    Education Handout Provided  No    Muscles Treated Lower Body  Gluteus minimus    Gluteus Minimus Response  Twitch response elicited;Palpable increased muscle length           PT Education - 04/04/18 1232    Education Details  discussed during pulmonary rehab with his 15 min walk to try increasing his pace without overly stressing his respiratory system to promote momentum to reduce soreness in the glute medius due to him walking potentially too slow.     Person(s) Educated  Patient    Methods  Explanation;Verbal cues    Comprehension  Verbalized understanding;Verbal cues required       PT Short Term Goals - 04/02/18 1227      PT SHORT TERM GOAL #1   Title  He will be independent with iniital HEP    Status  Achieved      PT SHORT TERM GOAL #2   Title  He will report soreness in spine to palpation decr 30% or more    Status  Achieved      PT SHORT TERM GOAL #3   Title  He will report able to walk for 15 min before pain in leg starts    Status  On-going        PT Long Term Goals - 03/07/18 1204      PT LONG TERM GOAL #1   Title  He will report no lower leg symptoms with walking    Time  6    Period  Weeks    Status  New      PT LONG TERM GOAL #2   Title  He will report able to complete pulmonary rehab walking with 12- max LBP.     Time  6    Period  Weeks    Status  New      PT LONG TERM GOAL #3   Title  He will be independnet with all HEP issued    Time  6    Period  Weeks    Status  New            Plan - 04/04/18 1234    Clinical Impression Statement  Conitnued TPDN today in the glute medius followed with IASTM techniques. continued hip strengthening which he performed well and reported no increase soreness today compared to last DN session.     PT Treatment/Interventions  Iontophoresis 4mg /ml Dexamethasone;Moist Heat;Dry needling;Manual techniques;Patient/family education;Therapeutic  exercise;Ultrasound;Therapeutic activities;Taping    PT Next Visit Plan  Manual, modalities, HEP review, Add to HEP, core and hip strengthening    PT Home Exercise Plan  quadratus stretch on side and child's pose,  knee to chest RT/Lt shoulder, tennis ball STW, LTR and calf stretch     Consulted and Agree with Plan of Care  Patient       Patient will benefit from skilled therapeutic intervention in order to improve the following deficits and impairments:  Pain, Postural dysfunction, Increased muscle spasms, Decreased strength, Decreased range of motion, Difficulty walking  Visit Diagnosis: Muscle weakness (generalized)  Muscle spasm of back  Abnormal posture  Acute left-sided low back pain, with sciatica presence unspecified     Problem List Patient Active Problem List   Diagnosis Date Noted  . Emphysema lung ([place]) 12/29/2017   Starr Lake PT, DPT, LAT, ATC  04/04/18  12:37 PM      [facility] [address] [place], [place], [address] Phone: [phone]   Fax:  [phone]  Name: Sean [name] MRN: [id number] Date of Birth: [birth year]

## 2018-04-04 NOTE — Progress Notes (Signed)
Pulmonary Individual Treatment Plan  Patient Details  Name: Sean [name] MRN: [id number] Date of Birth: 1950-09-14 Referring Provider:     Pulmonary Rehab Walk Test from 01/02/2018 in [place]  Referring Provider  Dr. Gwenette Greet      Initial Encounter Date:    Pulmonary Rehab Walk Test from 01/02/2018 in [place]  Date  01/02/18      Visit Diagnosis: Chronic obstructive pulmonary disease, unspecified COPD type ([place])  Patient's Home Medications on Admission:   Current Outpatient Medications:  .  albuterol (PROVENTIL HFA;VENTOLIN HFA) 108 (90 BASE) MCG/ACT inhaler, Inhale 2 puffs into the lungs every 6 (six) hours as needed for wheezing or shortness of breath., Disp: , Rfl:  .  albuterol (PROVENTIL) (2.5 MG/3ML) 0.083% nebulizer solution, Take 2.5 mg by nebulization every 6 (six) hours as needed for wheezing or shortness of breath., Disp: , Rfl:  .  amLODipine (NORVASC) 10 MG tablet, Take 10 mg by mouth daily., Disp: , Rfl:  .  amLODipine (NORVASC) 10 MG tablet, Take 10 mg by mouth daily., Disp: , Rfl:  .  benazepril (LOTENSIN) 10 MG tablet, Take 10 mg by mouth daily. Take 1/2 daily, Disp: , Rfl:  .  budesonide-formoterol (SYMBICORT) 160-4.5 MCG/ACT inhaler, Inhale 2 puffs into the lungs 2 (two) times daily. Rinse mouth well with water after each use, Disp: , Rfl:  .  cholecalciferol (VITAMIN D) 1000 UNITS tablet, Take 1,000 Units by mouth 2 (two) times daily., Disp: , Rfl:  .  hydrocortisone 2.5 % cream, Apply topically 3 (three) times daily. rectally, Disp: , Rfl:  .  meloxicam (MOBIC) 15 MG tablet, Take 15 mg by mouth daily., Disp: , Rfl:  .  metoprolol (TOPROL-XL) 200 MG 24 hr tablet, Take 200 mg by mouth daily., Disp: , Rfl:  .  multivitamin-lutein (OCUVITE-LUTEIN) CAPS capsule, Take 1 capsule by mouth daily., Disp: , Rfl:  .  niacin 100 MG tablet, Take 100 mg by mouth at bedtime., Disp: , Rfl:  .  ondansetron (ZOFRAN) 8  MG tablet, Take by mouth every 8 (eight) hours as needed for nausea or vomiting., Disp: , Rfl:  .  pantoprazole (PROTONIX) 40 MG tablet, Take 40 mg by mouth 2 (two) times daily., Disp: , Rfl:  .  thiamine 100 MG tablet, Take 100 mg by mouth daily., Disp: , Rfl:  .  tiotropium (SPIRIVA) 18 MCG inhalation capsule, Place 18 mcg into inhaler and inhale daily., Disp: , Rfl:  .  urea (CARMOL) 20 % cream, Apply topically 3 (three) times daily., Disp: , Rfl:   Past Medical History: Past Medical History:  Diagnosis Date  . Arthritis   . COPD (chronic obstructive pulmonary disease) ([place])   . Hypertension     Tobacco Use: Social History   Tobacco Use  Smoking Status Former Smoker  . Types: Cigarettes  . Last attempt to quit: 06/25/2013  . Years since quitting: 4.7  Smokeless Tobacco Former Systems developer  . Quit date: 06/25/1976    Labs: Recent Review Flowsheet Data    There is no flowsheet data to display.      Capillary Blood Glucose: No results found for: GLUCAP   Pulmonary Assessment Scores: Pulmonary Assessment Scores    Row Name 01/02/18 1633 01/23/18 0935       ADL UCSD   ADL Phase  Entry  Entry    SOB Score total  -  46      CAT Score  CAT Score  -  10      mMRC Score   mMRC Score  3  -       Pulmonary Function Assessment:   Exercise Target Goals: Exercise Program Goal: Individual exercise prescription set using results from initial 6 min walk test and THRR while considering  patient's activity barriers and safety.   Exercise Prescription Goal: Initial exercise prescription builds to 30-45 minutes a day of aerobic activity, 2-3 days per week.  Home exercise guidelines will be given to patient during program as part of exercise prescription that the participant will acknowledge.  Activity Barriers & Risk Stratification: Activity Barriers & Cardiac Risk Stratification - 12/29/17 1029      Activity Barriers & Cardiac Risk Stratification   Activity Barriers  Joint  Problems;Arthritis   bilateral knee pain      6 Minute Walk: 6 Minute Walk    Row Name 01/02/18 1635         6 Minute Walk   Phase  Initial     Distance  1443 feet     Walk Time  6 minutes     # of Rest Breaks  0     MPH  2.73     METS  3.07     RPE  13     Perceived Dyspnea   2     Symptoms  Yes (comment)     Comments  LEFT CALF BURNING     Resting HR  76 bpm     Resting BP  130/80     Resting Oxygen Saturation   92 %     Exercise Oxygen Saturation  during 6 min walk  89 %     Max Ex. HR  114 bpm     Max Ex. BP  152/84       Interval HR   1 Minute HR  88     2 Minute HR  90     3 Minute HR  82     4 Minute HR  87     5 Minute HR  88     6 Minute HR  114     2 Minute Post HR  82     Interval Heart Rate?  Yes       Interval Oxygen   Interval Oxygen?  Yes     Baseline Oxygen Saturation %  92 %     1 Minute Oxygen Saturation %  93 %     1 Minute Liters of Oxygen  0 L     2 Minute Oxygen Saturation %  89 %     2 Minute Liters of Oxygen  0 L     3 Minute Oxygen Saturation %  89 %     3 Minute Liters of Oxygen  0 L     4 Minute Oxygen Saturation %  89 %     4 Minute Liters of Oxygen  0 L     5 Minute Oxygen Saturation %  90 %     5 Minute Liters of Oxygen  0 L     6 Minute Oxygen Saturation %  89 %     6 Minute Liters of Oxygen  0 L     2 Minute Post Oxygen Saturation %  95 %     2 Minute Post Liters of Oxygen  0 L        Oxygen Initial Assessment: Oxygen Initial Assessment - 12/29/17  1004      Home Oxygen   Home Oxygen Device  None    Sleep Oxygen Prescription  None    Home Exercise Oxygen Prescription  None    Home at Rest Exercise Oxygen Prescription  None      Intervention   Short Term Goals  To learn and exhibit compliance with exercise, home and travel O2 prescription;To learn and understand importance of maintaining oxygen saturations>88%;To learn and demonstrate proper use of respiratory medications;To learn and understand importance of  monitoring SPO2 with pulse oximeter and demonstrate accurate use of the pulse oximeter.;To learn and demonstrate proper pursed lip breathing techniques or other breathing techniques.    Long  Term Goals  Exhibits compliance with exercise, home and travel O2 prescription;Verbalizes importance of monitoring SPO2 with pulse oximeter and return demonstration;Maintenance of O2 saturations>88%;Exhibits proper breathing techniques, such as pursed lip breathing or other method taught during program session;Compliance with respiratory medication;Demonstrates proper use of MDI's       Oxygen Re-Evaluation: Oxygen Re-Evaluation    Row Name 01/02/18 1632 01/16/18 0653 02/09/18 1346 03/05/18 1149 04/02/18 1626     Program Oxygen Prescription   Program Oxygen Prescription  -  None  None  None  None     Home Oxygen   Home Oxygen Device  -  None  None  None  None   Sleep Oxygen Prescription  -  None  None  None  None   Home Exercise Oxygen Prescription  None  None  None  None  None   Home at Rest Exercise Oxygen Prescription  None  None  None  None  None      Oxygen Discharge (Final Oxygen Re-Evaluation): Oxygen Re-Evaluation - 04/02/18 1626      Program Oxygen Prescription   Program Oxygen Prescription  None      Home Oxygen   Home Oxygen Device  None    Sleep Oxygen Prescription  None    Home Exercise Oxygen Prescription  None    Home at Rest Exercise Oxygen Prescription  None       Initial Exercise Prescription: Initial Exercise Prescription - 01/02/18 1600      Date of Initial Exercise RX and Referring Provider   Date  01/02/18    Referring Provider  Dr. Gwenette Greet      Oxygen   Oxygen  --      Bike   Level  0.4    Minutes  17      NuStep   Level  2    SPM  80    Minutes  17    METs  1.5      Track   Laps  10    Minutes  17      Prescription Details   Frequency (times per week)  2    Duration  Progress to 45 minutes of aerobic exercise without signs/symptoms of physical  distress      Intensity   THRR 40-80% of Max Heartrate  62-124    Ratings of Perceived Exertion  11-13    Perceived Dyspnea  0-4      Progression   Progression  Continue progressive overload as per policy without signs/symptoms or physical distress.      Resistance Training   Training Prescription  Yes    Weight  blue bands    Reps  10-15       Perform Capillary Blood Glucose checks as needed.  Exercise Prescription Changes:  Exercise  Prescription Changes    Row Name 01/09/18 1600 01/23/18 1500 02/06/18 1500 02/20/18 1600 03/06/18 1600     Response to Exercise   Blood Pressure (Admit)  146/82  140/80  124/62  126/68  122/64   Blood Pressure (Exercise)  138/80  140/84  140/80  144/80  128/84   Blood Pressure (Exit)  124/72  142/70  116/60  106/60  122/76   Heart Rate (Admit)  79 bpm  76 bpm  72 bpm  80 bpm  85 bpm   Heart Rate (Exercise)  99 bpm  90 bpm  93 bpm  99 bpm  102 bpm   Heart Rate (Exit)  71 bpm  65 bpm  66 bpm  77 bpm  82 bpm   Oxygen Saturation (Admit)  93 %  91 %  89 %  92 %  90 %   Oxygen Saturation (Exercise)  89 %  89 %  91 %  90 %  88 %   Oxygen Saturation (Exit)  94 %  93 %  92 %  92 %  91 %   Rating of Perceived Exertion (Exercise)  _0 Perceived Dyspnea (Exercise)  _1 Symptoms  none  -  -  -  -   Comments  pt was oriented to exercise equipment today  -  -  -  -   Duration  Progress to 45 minutes of aerobic exercise without signs/symptoms of physical distress  Progress to 45 minutes of aerobic exercise without signs/symptoms of physical distress  Progress to 45 minutes of aerobic exercise without signs/symptoms of physical distress  Progress to 45 minutes of aerobic exercise without signs/symptoms of physical distress  Progress to 45 minutes of aerobic exercise without signs/symptoms of physical distress   Intensity  THRR unchanged  THRR unchanged  THRR unchanged  THRR unchanged  THRR unchanged     Progression   Progression   Continue to progress workloads to maintain intensity without signs/symptoms of physical distress.  Continue to progress workloads to maintain intensity without signs/symptoms of physical distress.  Continue to progress workloads to maintain intensity without signs/symptoms of physical distress.  Continue to progress workloads to maintain intensity without signs/symptoms of physical distress.  Continue to progress workloads to maintain intensity without signs/symptoms of physical distress.     Resistance Training   Training Prescription  Yes  Yes  Yes  Yes  Yes   Weight  blue bands  blue bands  blue bands  blue bands  blue bands   Reps  10-15 pt does 10 minutes of strength training  10-15 pt does 10 minutes of strength training  10-15 pt does 10 minutes of strength training  10-15 pt does 10 minutes of strength training  10-15 pt does 10 minutes of strength training   Time  -  10 Minutes  10 Minutes  10 Minutes  10 Minutes     Interval Training   Interval Training  -  No  No  No  No     Bike   Level  0.6  0._2 Minutes  _3 NuStep   Level  2  2._4 SPM  80  80  80  80  80  Minutes  _0 METs  2.2  2.5  2.9  2.6  3     Track   Laps  _1 Minutes  _2 Home Exercise Plan   Plans to continue exercise at  [facility] (comment)  -  -  -   Frequency  -  Add 2 additional days to program exercise sessions.  -  -  -   Row Name 03/20/18 1600 04/03/18 1500           Response to Exercise   Blood Pressure (Admit)  132/80  126/70      Blood Pressure (Exercise)  158/78  142/70      Blood Pressure (Exit)  140/74  126/78      Heart Rate (Admit)  69 bpm  77 bpm      Heart Rate (Exercise)  99 bpm  98 bpm      Heart Rate (Exit)  81 bpm  82 bpm      Oxygen Saturation (Admit)  93 %  93 %      Oxygen Saturation (Exercise)  89 %  89 %      Oxygen Saturation (Exit)  92 %  91 %      Rating of Perceived  Exertion (Exercise)  17  15      Perceived Dyspnea (Exercise)  2  3      Duration  Progress to 45 minutes of aerobic exercise without signs/symptoms of physical distress  Continue with 45 min of aerobic exercise without signs/symptoms of physical distress.      Intensity  THRR unchanged  THRR unchanged        Progression   Progression  Continue to progress workloads to maintain intensity without signs/symptoms of physical distress.  Continue to progress workloads to maintain intensity without signs/symptoms of physical distress.        Resistance Training   Training Prescription  Yes  No      Weight  blue namds  blue bands      Reps  10-15  10-15      Time  10 Minutes  10 Minutes        Interval Training   Interval Training  No  No        Bike   Level  1.1  1.1      Minutes  17  17        NuStep   Level  -  6      SPM  -  80      Minutes  -  17      METs  -  3.7        Track   Laps  -  9      Minutes  17  17         Exercise Comments:  Exercise Comments    Row Name 01/23/18 1604           Exercise Comments  Home exercise completed          Exercise Goals and Review:  Exercise Goals    Row Name 12/29/17 1025             Exercise Goals   Increase Physical Activity  Yes       Intervention  Provide advice,  education, support and counseling about physical activity/exercise needs.;Develop an individualized exercise prescription for aerobic and resistive training based on initial evaluation findings, risk stratification, comorbidities and participant's personal goals.       Expected Outcomes  Short Term: Attend rehab on a regular basis to increase amount of physical activity.;Long Term: Add in home exercise to make exercise part of routine and to increase amount of physical activity.;Long Term: Exercising regularly at least 3-5 days a week.       Increase Strength and Stamina  Yes       Intervention  Provide advice, education, support and counseling about physical  activity/exercise needs.;Develop an individualized exercise prescription for aerobic and resistive training based on initial evaluation findings, risk stratification, comorbidities and participant's personal goals.       Expected Outcomes  Short Term: Increase workloads from initial exercise prescription for resistance, speed, and METs.;Short Term: Perform resistance training exercises routinely during rehab and add in resistance training at home;Long Term: Improve cardiorespiratory fitness, muscular endurance and strength as measured by increased METs and functional capacity (6MWT)       Able to understand and use rate of perceived exertion (RPE) scale  Yes       Intervention  Provide education and explanation on how to use RPE scale       Expected Outcomes  Short Term: Able to use RPE daily in rehab to express subjective intensity level;Long Term:  Able to use RPE to guide intensity level when exercising independently       Able to understand and use Dyspnea scale  Yes       Intervention  Provide education and explanation on how to use Dyspnea scale       Expected Outcomes  Short Term: Able to use Dyspnea scale daily in rehab to express subjective sense of shortness of breath during exertion;Long Term: Able to use Dyspnea scale to guide intensity level when exercising independently       Knowledge and understanding of Target Heart Rate Range (THRR)  Yes       Intervention  Provide education and explanation of THRR including how the numbers were predicted and where they are located for reference       Expected Outcomes  Short Term: Able to state/look up THRR;Long Term: Able to use THRR to govern intensity when exercising independently;Short Term: Able to use daily as guideline for intensity in rehab       Understanding of Exercise Prescription  Yes       Intervention  Provide education, explanation, and written materials on patient's individual exercise prescription       Expected Outcomes  Short Term:  Able to explain program exercise prescription;Long Term: Able to explain home exercise prescription to exercise independently          Exercise Goals Re-Evaluation : Exercise Goals Re-Evaluation    Row Name 01/16/18 0653 01/16/18 0656 02/09/18 1347 03/05/18 1149 04/02/18 1626     Exercise Goal Re-Evaluation   Exercise Goals Review  Increase Strength and Stamina;Increase Physical Activity;Able to understand and use rate of perceived exertion (RPE) scale;Knowledge and understanding of Target Heart Rate Range (THRR);Understanding of Exercise Prescription;Able to understand and use Dyspnea scale  -  Increase Strength and Stamina;Increase Physical Activity;Able to understand and use rate of perceived exertion (RPE) scale;Knowledge and understanding of Target Heart Rate Range (THRR);Understanding of Exercise Prescription;Able to understand and use Dyspnea scale  Increase Strength and Stamina;Increase Physical Activity;Able to understand and use rate of  perceived exertion (RPE) scale;Knowledge and understanding of Target Heart Rate Range (THRR);Understanding of Exercise Prescription;Able to understand and use Dyspnea scale  Increase Strength and Stamina;Increase Physical Activity;Able to understand and use rate of perceived exertion (RPE) scale;Knowledge and understanding of Target Heart Rate Range (THRR);Understanding of Exercise Prescription;Able to understand and use Dyspnea scale   Comments  Patient has only attended two rehab sessions. Will cont. to monitor and progress as able.   -  Patient is progressing at a slow and steady pace. Patient is able to walk 16 laps (200 ft each) in 15 minutes. Met level average is just below moderate. Barrier is his severe shortness of breath and progressed COPD. Patient is open to workload changes and making changes. Will cont. to monitor and motivate as able.  Patient is progressing at a slow and steady pace. Patient is able to walk 16 laps (200 ft each) in 15 minutes. Met  level average is just below moderate. Barrier is his severe shortness of breath and progressed COPD. Patient is open to workload changes and making changes. Will cont. to monitor and motivate as able.  Patient is progressing at a slow and steady pace. Patient is able to walk 16 laps (200 ft each) in 15 minutes. Met level average is just below moderate. Barrier is his severe shortness of breath and progressed COPD. Patient is open to workload changes and making changes. Will cont. to monitor and motivate as able.   Expected Outcomes  Through e  Through exercise at rehab and at home, the patient will decrease shortness of breath with daily activities and feel confident in carrying out an exercise regime at home.   Through exercise at rehab and at home, the patient will decrease shortness of breath with daily activities and feel confident in carrying out an exercise regime at home.   Through exercise at rehab and at home, the patient will decrease shortness of breath with daily activities and feel confident in carrying out an exercise regime at home.   Through exercise at rehab and at home, the patient will decrease shortness of breath with daily activities and feel confident in carrying out an exercise regime at home.       Discharge Exercise Prescription (Final Exercise Prescription Changes): Exercise Prescription Changes - 04/03/18 1500      Response to Exercise   Blood Pressure (Admit)  126/70    Blood Pressure (Exercise)  142/70    Blood Pressure (Exit)  126/78    Heart Rate (Admit)  77 bpm    Heart Rate (Exercise)  98 bpm    Heart Rate (Exit)  82 bpm    Oxygen Saturation (Admit)  93 %    Oxygen Saturation (Exercise)  89 %    Oxygen Saturation (Exit)  91 %    Rating of Perceived Exertion (Exercise)  15    Perceived Dyspnea (Exercise)  3    Duration  Continue with 45 min of aerobic exercise without signs/symptoms of physical distress.    Intensity  THRR unchanged      Progression    Progression  Continue to progress workloads to maintain intensity without signs/symptoms of physical distress.      Resistance Training   Training Prescription  No    Weight  blue bands    Reps  10-15    Time  10 Minutes      Interval Training   Interval Training  No      Bike   Level  1.1    Minutes  17      NuStep   Level  6    SPM  80    Minutes  17    METs  3.7      Track   Laps  9    Minutes  17       Nutrition:  Target Goals: Understanding of nutrition guidelines, daily intake of sodium <1582m, cholesterol <2039m calories 30% from fat and 7% or less from saturated fats, daily to have 5 or more servings of fruits and vegetables.  Biometrics: Pre Biometrics - 01/09/18 1601      Pre Biometrics   Weight  203 lb 4.2 oz (92.2 kg)    BMI (Calculated)  33.06        Nutrition Therapy Plan and Nutrition Goals: Nutrition Therapy & Goals - 01/15/18 1037      Nutrition Therapy   Diet  General, healthful      Personal Nutrition Goals   Nutrition Goal  Wt loss of 1-2 lb/week to a wt loss goal of 6-24 lb at graduation from Pulmonary Rehab    Personal Goal #2  The pt will recognize symptoms that can interfere with adequate oral intake, such as shortness of breath, N/V, early satiety, fatigue, ability to secure and prepare food, taste and smell changes, chewing/swallowing difficulties, and/ or pain when eating.      Intervention Plan   Intervention  Prescribe, educate and counsel regarding individualized specific dietary modifications aiming towards targeted core components such as weight, hypertension, lipid management, diabetes, heart failure and other comorbidities.    Expected Outcomes  Short Term Goal: Understand basic principles of dietary content, such as calories, fat, sodium, cholesterol and nutrients.;Long Term Goal: Adherence to prescribed nutrition plan.       Nutrition Assessments: Nutrition Assessments - 01/15/18 1037      Rate Your Plate Scores   Pre  Score  53       Nutrition Goals Re-Evaluation:   Nutrition Goals Discharge (Final Nutrition Goals Re-Evaluation):   Psychosocial: Target Goals: Acknowledge presence or absence of significant depression and/or stress, maximize coping skills, provide positive support system. Participant is able to verbalize types and ability to use techniques and skills needed for reducing stress and depression.  Initial Review & Psychosocial Screening: Initial Psych Review & Screening - 12/29/17 1027      Initial Review   Current issues with  None Identified      Family Dynamics   Good Support System?  Yes    Comments  recently loss his dog of 5 years      Barriers   Psychosocial barriers to participate in program  The patient should benefit from training in stress management and relaxation.      Screening Interventions   Interventions  Encouraged to exercise    Expected Outcomes  Long Term Goal: Stressors or current issues are controlled or eliminated.;Short Term goal: Identification and review with participant of any Quality of Life or Depression concerns found by scoring the questionnaire.       Quality of Life Scores:  Scores of 19 and below usually indicate a poorer quality of life in these areas.  A difference of  2-3 points is a clinically meaningful difference.  A difference of 2-3 points in the total score of the Quality of Life Index has been associated with significant improvement in overall quality of life, self-image, physical symptoms, and general health in studies assessing change in quality of  life.  PHQ-9: Recent Review Flowsheet Data    Depression screen [facility] 2/9 12/29/2017 10/29/2015 06/29/2015   Decreased Interest 0 0 1   Down, Depressed, Hopeless 0 0 0   PHQ - 2 Score 0 0 1   Altered sleeping 0 - -   Tired, decreased energy 1 - -   Change in appetite 0 - -   Feeling bad or failure about yourself  0 - -   Trouble concentrating 0 - -   Moving slowly or fidgety/restless 0  - -   Suicidal thoughts 0 - -   PHQ-9 Score 1 - -   Difficult doing work/chores Somewhat difficult - -     Interpretation of Total Score  Total Score Depression Severity:  1-4 = Minimal depression, 5-9 = Mild depression, 10-14 = Moderate depression, 15-19 = Moderately severe depression, 20-27 = Severe depression   Psychosocial Evaluation and Intervention: Psychosocial Evaluation - 03/06/18 1041      Psychosocial Evaluation & Interventions   Interventions  Stress management education;Relaxation education;Encouraged to exercise with the program and follow exercise prescription    Comments  Pt feels supported in his rehab participation.  Pt misses his dog that he lost 5 years ago.    Expected Outcomes  Pt will be free of any psychosocial needs    Continue Psychosocial Services   Follow up required by staff       Psychosocial Re-Evaluation: Psychosocial Re-Evaluation    [place] Name 02/13/18 1147 03/06/18 1041           Psychosocial Re-Evaluation   Current issues with  None Identified  None Identified      Comments  No barriers to participation in pulmonary rehab  No barriers to participation in pulmonary rehab      Expected Outcomes  No barriers to participation in pulmonary rehab  No barriers to participation in pulmonary rehab      Interventions  Encouraged to attend Pulmonary Rehabilitation for the exercise;Relaxation education;Stress management education  Encouraged to attend Pulmonary Rehabilitation for the exercise;Relaxation education;Stress management education      Continue Psychosocial Services   No Follow up required  Follow up required by staff         Psychosocial Discharge (Final Psychosocial Re-Evaluation): Psychosocial Re-Evaluation - 03/06/18 1041      Psychosocial Re-Evaluation   Current issues with  None Identified    Comments  No barriers to participation in pulmonary rehab    Expected Outcomes  No barriers to participation in pulmonary rehab    Interventions   Encouraged to attend Pulmonary Rehabilitation for the exercise;Relaxation education;Stress management education    Continue Psychosocial Services   Follow up required by staff       Education: Education Goals: Education classes will be provided on a weekly basis, covering required topics. Participant will state understanding/return demonstration of topics presented.  Learning Barriers/Preferences: Learning Barriers/Preferences - 12/29/17 1514      Learning Barriers/Preferences   Learning Barriers  None    Learning Preferences  Verbal Instruction;Individual Instruction;Group Instruction;Computer/Internet;Skilled Demonstration;Video;Written Material;Pictoral       Education Topics: Risk Factor Reduction:  -Group instruction that is supported by a PowerPoint presentation. Instructor discusses the definition of a risk factor, different risk factors for pulmonary disease, and how the heart and lungs work together.     Nutrition for Pulmonary Patient:  -Group instruction provided by PowerPoint slides, verbal discussion, and written materials to support subject matter. The instructor gives an explanation and review of  healthy diet recommendations, which includes a discussion on weight management, recommendations for fruit and vegetable consumption, as well as protein, fluid, caffeine, fiber, sodium, sugar, and alcohol. Tips for eating when patients are short of breath are discussed.   Pursed Lip Breathing:  -Group instruction that is supported by demonstration and informational handouts. Instructor discusses the benefits of pursed lip and diaphragmatic breathing and detailed demonstration on how to preform both.     Oxygen Safety:  -Group instruction provided by PowerPoint, verbal discussion, and written material to support subject matter. There is an overview of "What is Oxygen" and "Why do we need it".  Instructor also reviews how to create a safe environment for oxygen use, the importance  of using oxygen as prescribed, and the risks of noncompliance. There is a brief discussion on traveling with oxygen and resources the patient may utilize.   PULMONARY REHAB CHRONIC OBSTRUCTIVE PULMONARY DISEASE from 03/29/2018 in [place]  Date  03/01/18  Educator  Cloyde Reams  Instruction Review Code  1- Verbalizes Understanding      Oxygen Equipment:  -Group instruction provided by [employer] utilizing handouts, written materials, and Insurance underwriter.   PULMONARY REHAB CHRONIC OBSTRUCTIVE PULMONARY DISEASE from 03/29/2018 in [place]  Date  03/08/18  Educator  [place]  Instruction Review Code  2- Demonstrated Understanding      Signs and Symptoms:  -Group instruction provided by written material and verbal discussion to support subject matter. Warning signs and symptoms of infection, stroke, and heart attack are reviewed and when to call the physician/911 reinforced. Tips for preventing the spread of infection discussed.   PULMONARY REHAB CHRONIC OBSTRUCTIVE PULMONARY DISEASE from 03/29/2018 in [place]  Date  02/22/18  Educator  Remo Lipps  Instruction Review Code  1- Verbalizes Understanding      Advanced Directives:  -Group instruction provided by verbal instruction and written material to support subject matter. Instructor reviews Advanced Directive laws and proper instruction for filling out document.   Pulmonary Video:  -Group video education that reviews the importance of medication and oxygen compliance, exercise, good nutrition, pulmonary hygiene, and pursed lip and diaphragmatic breathing for the pulmonary patient.   Exercise for the Pulmonary Patient:  -Group instruction that is supported by a PowerPoint presentation. Instructor discusses benefits of exercise, core components of exercise, frequency, duration, and intensity of an exercise routine, importance of  utilizing pulse oximetry during exercise, safety while exercising, and options of places to exercise outside of rehab.     PULMONARY REHAB CHRONIC OBSTRUCTIVE PULMONARY DISEASE from 03/29/2018 in [place]  Date  03/15/18  Instruction Review Code  1- Verbalizes Understanding      Pulmonary Medications:  -Verbally interactive group education provided by instructor with focus on inhaled medications and proper administration.   PULMONARY REHAB CHRONIC OBSTRUCTIVE PULMONARY DISEASE from 02/22/2018 in [place]  Date  02/01/18  Educator  pharmacy  Instruction Review Code  1- Verbalizes Understanding      Anatomy and Physiology of the Respiratory System and Intimacy:  -Group instruction provided by PowerPoint, verbal discussion, and written material to support subject matter. Instructor reviews respiratory cycle and anatomical components of the respiratory system and their functions. Instructor also reviews differences in obstructive and restrictive respiratory diseases with examples of each. Intimacy, Sex, and Sexuality differences are reviewed with a discussion on how relationships can change when diagnosed with pulmonary  disease. Common sexual concerns are reviewed.   PULMONARY REHAB CHRONIC OBSTRUCTIVE PULMONARY DISEASE from 02/22/2018 in [place]  Date  01/11/18  Educator  rn  Instruction Review Code  2- Demonstrated Understanding      MD DAY -A group question and answer session with a medical doctor that allows participants to ask questions that relate to their pulmonary disease state.   PULMONARY REHAB CHRONIC OBSTRUCTIVE PULMONARY DISEASE from 03/29/2018 in [place]  Date  03/20/18  Educator  Dr. Nelda Marseille  Instruction Review Code  1- Verbalizes Understanding      OTHER EDUCATION -Group or individual verbal, written, or video instructions that support the  educational goals of the pulmonary rehab program.   PULMONARY REHAB CHRONIC OBSTRUCTIVE PULMONARY DISEASE from 02/22/2018 in [place]  Date  02/08/18  Educator  Lucianne Lei [facility]]  Instruction Review Code  1- Verbalizes Understanding      Holiday Eating Survival Tips:  -Group instruction provided by PowerPoint slides, verbal discussion, and written materials to support subject matter. The instructor gives patients tips, tricks, and techniques to help them not only survive but enjoy the holidays despite the onslaught of food that accompanies the holidays.   Knowledge Questionnaire Score: Knowledge Questionnaire Score - 01/23/18 0935      Knowledge Questionnaire Score   Pre Score  17/18       Core Components/Risk Factors/Patient Goals at Admission: Personal Goals and Risk Factors at Admission - 12/29/17 1024      Core Components/Risk Factors/Patient Goals on Admission    Weight Management  Weight Loss    Improve shortness of breath with ADL's  Yes    Intervention  Provide education, individualized exercise plan and daily activity instruction to help decrease symptoms of SOB with activities of daily living.    Expected Outcomes  Short Term: Improve cardiorespiratory fitness to achieve a reduction of symptoms when performing ADLs    Hypertension  Yes    Intervention  Provide education on lifestyle modifcations including regular physical activity/exercise, weight management, moderate sodium restriction and increased consumption of fresh fruit, vegetables, and low fat dairy, alcohol moderation, and smoking cessation.;Monitor prescription use compliance.    Expected Outcomes  Short Term: Continued assessment and intervention until BP is < 140/43m HG in hypertensive participants. < 130/870mHG in hypertensive participants with diabetes, heart failure or chronic kidney disease.;Long Term: Maintenance of blood pressure at goal levels.        Core Components/Risk Factors/Patient Goals Review:  Goals and Risk Factor Review    Row Name 01/17/18 1220 02/13/18 1144 03/06/18 1038 04/04/18 1250       Core Components/Risk Factors/Patient Goals Review   Personal Goals Review  Weight Management/Obesity;Improve shortness of breath with ADL's;Hypertension  Weight Management/Obesity;Improve shortness of breath with ADL's;Hypertension  Weight Management/Obesity;Improve shortness of breath with ADL's;Hypertension  Weight Management/Obesity;Improve shortness of breath with ADL's;Hypertension;Develop more efficient breathing techniques such as purse lipped breathing and diaphragmatic breathing and practicing self-pacing with activity.    Review  Pt is off to a good start in pulmonary rehab.  Pt weight remains the same however pt has only attended 3 exercise sessions.  Pt is learning PLB and diaphragmatic breathing techniques.  Pt needs cues but is able to demonstrate appropriately.  Pt o2 sat do hoover in the upper 80's when ambulating on the track. Pt with continued elevation in BP Pre exercise despite compliance with his  medications amd diet. However his BP post exercise is generally lower than pre.  I anticipate that with continued participation in the pulmonary rehab program  toward meating his goals.    Progressing well, would like to see a little more effort, second time through program, 1.0 on airdyne, 16 laps on track, level 4 on nustep  Pt has completed 13 exercise sessions. Progressing well, Rehab staff are seeing more effort, second time through program. Pt workloads show  1.0 on airdyne, 16 laps on track, level 4 on nustep.  Pt shows .9 kg decrease since begining the program.  Pt finds it difficult to engage in diaphragmatic breathing due to large abdominal area.  Pt has completed 22 exercise sessions and received extension on his authorization from the [place] . Rehab staff are seeing more effort, second time through program. Pt workloads show  increase to 1.1 on airdyne, averages 13-15 laps on track which is a decrease since last 30 day review. Increase to  level 6 on nustep.  Pt shows .9 kg decrease since begining the program.  Pt finds it difficult to engage in diaphragmatic breathing due to large abdominal area. Pt plans to graduate in the next couple of weeks. Will emphasis consistent home exericise.    Expected Outcomes  -  see admission outcomes/goals  see admission outcomes/goals  See Admission Outcomes/Goals       Core Components/Risk Factors/Patient Goals at Discharge (Final Review):  Goals and Risk Factor Review - 04/04/18 1250      Core Components/Risk Factors/Patient Goals Review   Personal Goals Review  Weight Management/Obesity;Improve shortness of breath with ADL's;Hypertension;Develop more efficient breathing techniques such as purse lipped breathing and diaphragmatic breathing and practicing self-pacing with activity.    Review  Pt has completed 22 exercise sessions and received extension on his authorization from the [place] . Rehab staff are seeing more effort, second time through program. Pt workloads show increase to 1.1 on airdyne, averages 13-15 laps on track which is a decrease since last 30 day review. Increase to  level 6 on nustep.  Pt shows .9 kg decrease since begining the program.  Pt finds it difficult to engage in diaphragmatic breathing due to large abdominal area. Pt plans to graduate in the next couple of weeks. Will emphasis consistent home exericise.    Expected Outcomes  See Admission Outcomes/Goals       ITP Comments: ITP Comments    Row Name 12/29/17 1004 01/17/18 1220 03/06/18 1038 04/04/18 1250     ITP Comments  Dr. Jennet Maduro, Medical Director  Dr. Jennet Maduro, Medical Director  Dr. Jennet Maduro, Medical Director  Dr. Jennet Maduro, Medical Director       Comments:  Pt has completed 22 exercise sessions. Cherre Huger, BSN Cardiac and Training and development officer

## 2018-04-05 ENCOUNTER — Encounter (HOSPITAL_COMMUNITY)
Admission: RE | Admit: 2018-04-05 | Discharge: 2018-04-05 | Disposition: A | Discharge status: Home / Self Care | Payer: No Typology Code available for payment source | Source: Ambulatory Visit | Attending: Pulmonary Disease | Admitting: Pulmonary Disease

## 2018-04-05 DIAGNOSIS — J449 Chronic obstructive pulmonary disease, unspecified: Secondary | ICD-10-CM

## 2018-04-05 NOTE — Progress Notes (Signed)
Daily Session Note  Patient Details  Name: Sean [name] MRN: [id number] Date of Birth: 1950/10/04 Referring Provider:     Pulmonary Rehab Walk Test from 01/02/2018 in [place]  Referring Provider  Dr. Gwenette Greet      Encounter Date: 04/05/2018  Check In: Session Check In - 04/05/18 1417      Check-In   Supervising physician immediately available to respond to emergencies  [facility] immediately available    Physician(s)  Dr. Algis Liming    Location  [facility]    Staff Present  Maurice Small, RN, BSN;Molly DiVincenzo, MS, ACSM RCEP, Exercise Physiologist;Annedrea Rosezella [place], RN, MHA;Olinty Olean, MS, ACSM CEP, Exercise Physiologist    Medication changes reported      No    Fall or balance concerns reported     No    Tobacco Cessation  No Change    Warm-up and Cool-down  Performed as group-led instruction    Resistance Training Performed  Yes    VAD Patient?  No    PAD/SET Patient?  No      Pain Assessment   Currently in Pain?  No/denies    Multiple Pain Sites  No       Capillary Blood Glucose: No results found for this or any previous visit (from the past 24 hour(s)).    Social History   Tobacco Use  Smoking Status Former Smoker  . Types: Cigarettes  . Last attempt to quit: 06/25/2013  . Years since quitting: 4.7  Smokeless Tobacco Former Systems developer  . Quit date: 06/25/1976    Goals Met:  Exercise tolerated well No report of cardiac concerns or symptoms Strength training completed today  Goals Unmet:  Not Applicable  Comments: Service time is from 1330 to 1530    Dr. Rush Farmer is Medical Director for Pulmonary Rehab at [facility].

## 2018-04-09 ENCOUNTER — Encounter: Payer: Self-pay | Admitting: Physical Therapy

## 2018-04-09 ENCOUNTER — Ambulatory Visit: Payer: No Typology Code available for payment source | Admitting: Physical Therapy

## 2018-04-09 DIAGNOSIS — M545 Low back pain: Secondary | ICD-10-CM

## 2018-04-09 DIAGNOSIS — M6281 Muscle weakness (generalized): Secondary | ICD-10-CM | POA: Diagnosis not present

## 2018-04-09 DIAGNOSIS — M62830 Muscle spasm of back: Secondary | ICD-10-CM

## 2018-04-09 DIAGNOSIS — R293 Abnormal posture: Secondary | ICD-10-CM

## 2018-04-09 NOTE — Therapy (Addendum)
[place], [place], [address] Phone: [phone]   Fax:  [phone]  Physical Therapy Treatment  Patient Details  Name: Sean [name] MRN: [id number] Date of Birth: 06-Apr-1951 Referring Provider: Rolla Etienne  PA-C   Encounter Date: 04/09/2018  PT End of Session - 04/09/18 1148    Visit Number  7    Number of Visits  15    Date for PT Re-Evaluation  04/27/18    Authorization Type  [place]    Authorization Time Period  progress visit 10       Authorization - Visit Number  7    Authorization - Number of Visits  15    PT Start Time  1696    PT End Time 1228   Total time 40 min    Activity Tolerance  Patient tolerated treatment well    Behavior During Therapy  [facility] for tasks assessed/performed       Past Medical History:  Diagnosis Date  . Arthritis   . COPD (chronic obstructive pulmonary disease) ([place])   . Hypertension     History reviewed. No pertinent surgical history.  There were no vitals filed for this visit.  Subjective Assessment - 04/09/18 1148    Subjective  "I finally convinced myself to work out at home, pain is only 1 today"     Currently in Pain?  Yes    Pain Score  0-No pain    Pain Orientation  Left    Pain Descriptors / Indicators  Sore    Aggravating Factors   prolonged walking/ standing    Pain Relieving Factors  resting/ sitting                       [facility] Adult PT Treatment/Exercise - 04/09/18 0001      Self-Care   Self-Care  Other Self-Care Comments    Other Self-Care Comments   how to perform manual trigger point release using tennis ball or using a theracane and it's benefits as it compares to trigger points      Lumbar Exercises: Stretches   Other Lumbar Stretch Exercise  glute med stretching in R sidelying 2 x 30 sec      Lumbar Exercises: Supine   Pelvic Tilt  10 reps;5 seconds   in decompression position     Knee/Hip Exercises: Standing   Hip  Abduction  2 sets;Knee straight   with yellow band around knee going to fatigue   Hip Extension  2 sets;10 reps;Knee straight   yellow band around knee going to fatgiue     Knee/Hip Exercises: Supine   Bridges  2 sets;10 reps   heels on physioball     Manual Therapy   Manual therapy comments  manual trigger point release along l glute medius             PT Education - 04/09/18 1201    Education Details  manual trigger point release techniques       PT Short Term Goals - 04/02/18 1227      PT SHORT TERM GOAL #1   Title  He will be independent with iniital HEP    Status  Achieved      PT SHORT TERM GOAL #2   Title  He will report soreness in spine to palpation decr 30% or more    Status  Achieved      PT SHORT TERM GOAL #3   Title  He will report able to walk for 15 min before pain in leg starts    Status  On-going        PT Long Term Goals - 03/07/18 1204      PT LONG TERM GOAL #1   Title  He will report no lower leg symptoms with walking    Time  6    Period  Weeks    Status  New      PT LONG TERM GOAL #2   Title  He will report able to complete pulmonary rehab walking with 12- max LBP.     Time  6    Period  Weeks    Status  New      PT LONG TERM GOAL #3   Title  He will be independnet with all HEP issued    Time  6    Period  Weeks    Status  New            Plan - 04/09/18 1245    Clinical Impression Statement  pt reports only 1/10 pain today. educated about MTPR techniques and tools he can use at home. continued strengthening hips with emphasis on endurance training which he performed well. added core strengthening as well which he fatigued quickly with and required frequent verbal cues for breathing and tactile cues for proper form. no pain noted at end of session.     PT Treatment/Interventions  Iontophoresis 4mg /ml Dexamethasone;Moist Heat;Dry needling;Manual techniques;Patient/family education;Therapeutic exercise;Ultrasound;Therapeutic  activities;Taping    PT Next Visit Plan  Manual, modalities, HEP review, Add to HEP, core and hip strengthening    PT Home Exercise Plan  quadratus stretch on side and child's pose,  knee to chest RT/Lt shoulder, tennis ball STW, LTR and calf stretch     Consulted and Agree with Plan of Care  Patient       Patient will benefit from skilled therapeutic intervention in order to improve the following deficits and impairments:  Pain, Postural dysfunction, Increased muscle spasms, Decreased strength, Decreased range of motion, Difficulty walking  Visit Diagnosis: Muscle weakness (generalized)  Muscle spasm of back  Abnormal posture  Acute left-sided low back pain, with sciatica presence unspecified     Problem List Patient Active Problem List   Diagnosis Date Noted  . Emphysema lung ([place]) 12/29/2017   Starr Lake PT, DPT, LAT, ATC  04/09/18  12:48 PM      [place] [address] [place], [place], [address] Phone: [phone]   Fax:  [phone]  Name: Sean [name] MRN: [id number] Date of Birth: [birth year]

## 2018-04-10 ENCOUNTER — Encounter (HOSPITAL_COMMUNITY)
Admission: RE | Admit: 2018-04-10 | Discharge: 2018-04-10 | Disposition: A | Discharge status: Home / Self Care | Payer: No Typology Code available for payment source | Source: Ambulatory Visit | Attending: Pulmonary Disease | Admitting: Pulmonary Disease

## 2018-04-10 DIAGNOSIS — J449 Chronic obstructive pulmonary disease, unspecified: Secondary | ICD-10-CM

## 2018-04-11 ENCOUNTER — Ambulatory Visit: Payer: No Typology Code available for payment source | Admitting: Physical Therapy

## 2018-04-11 ENCOUNTER — Encounter: Payer: Self-pay | Admitting: Physical Therapy

## 2018-04-11 ENCOUNTER — Encounter (HOSPITAL_COMMUNITY): Payer: Self-pay | Admitting: *Deleted

## 2018-04-11 DIAGNOSIS — M6281 Muscle weakness (generalized): Secondary | ICD-10-CM | POA: Diagnosis not present

## 2018-04-11 DIAGNOSIS — M62830 Muscle spasm of back: Secondary | ICD-10-CM

## 2018-04-11 DIAGNOSIS — M545 Low back pain: Secondary | ICD-10-CM

## 2018-04-11 DIAGNOSIS — R293 Abnormal posture: Secondary | ICD-10-CM

## 2018-04-11 NOTE — Therapy (Addendum)
[place], [place], [address] Phone: [phone]   Fax:  [phone]  Physical Therapy Treatment  Patient Details  Name: Sean [name] MRN: [id number] Date of Birth: Jan 03, 1951 Referring Provider: Rolla Etienne  PA-C   Encounter Date: 04/11/2018  PT End of Session - 04/11/18 1135    Visit Number  8    Number of Visits  15    Date for PT Re-Evaluation  04/27/18    Authorization Type  [place]    Authorization - Visit Number  8    Authorization - Number of Visits  15    PT Start Time  3790    PT Stop Time  1218    PT Time Calculation (min)  43 min    Activity Tolerance  Patient tolerated treatment well    Behavior During Therapy  [facility] for tasks assessed/performed       Past Medical History:  Diagnosis Date  . Arthritis   . COPD (chronic obstructive pulmonary disease) ([place])   . Hypertension     History reviewed. No pertinent surgical history.  There were no vitals filed for this visit.                    [place] Adult PT Treatment/Exercise - 04/11/18 1142      Lumbar Exercises: Stretches   Lower Trunk Rotation Limitations  10 x 5 sec ea way      Lumbar Exercises: Standing   Other Standing Lumbar Exercises  pressing down with bil UE through the ball focusing on breathing out during press down and inhaling during release. 2 x 10      Lumbar Exercises: Seated   Other Seated Lumbar Exercises  horizontal lift/ chop with red theraband 2 x 10 bil, combined with palloff press 2 x 10 with red theraband   while seated on dyna disc   Other Seated Lumbar Exercises  marching 2 x 20 seated on dyna disc keeping core tight      Lumbar Exercises: Supine   Pelvic Tilt  10 reps;5 seconds   with heels on physioball   Dead Bug  10 reps   x 2 sets alternating L/ R      Knee/Hip Exercises: Aerobic   Tread Mill  L1.5 incline 10 x 2 min - stopped exercise at 2 min   reproduce symptoms in the hip and L leg      Knee/Hip Exercises: Standing   Hip Abduction  2 sets;Knee straight   with bil HHA on red physioball to promtoe core activation   Hip Extension  2 sets;10 reps;Knee straight   with bil HHA on red physioball to promote core activation            PT Education - 04/11/18 1221    Education Details  updated HEP for lift / chop    Person(s) Educated  Patient    Methods  Explanation;Verbal cues;Demonstration    Comprehension  Verbalized understanding;Verbal cues required;Returned demonstration       PT Short Term Goals - 04/02/18 1227      PT SHORT TERM GOAL #1   Title  He will be independent with iniital HEP    Status  Achieved      PT SHORT TERM GOAL #2   Title  He will report soreness in spine to palpation decr 30% or more    Status  Achieved      PT SHORT TERM GOAL #3  Title  He will report able to walk for 15 min before pain in leg starts    Status  On-going        PT Long Term Goals - 03/07/18 1204      PT LONG TERM GOAL #1   Title  He will report no lower leg symptoms with walking    Time  6    Period  Weeks    Status  New      PT LONG TERM GOAL #2   Title  He will report able to complete pulmonary rehab walking with 12- max LBP.     Time  6    Period  Weeks    Status  New      PT LONG TERM GOAL #3   Title  He will be independnet with all HEP issued    Time  6    Period  Weeks    Status  New            Plan - 04/11/18 1221    Clinical Impression Statement  pt reports no pain in the hip since last session and has been doing better but continues to have burning inthe L lower calf. trialed uphill walking on treadmill which reproduced the calf burning/ pain within 2 min, pt reported fleeting pain once in a flexed decompression position which may suggest potential neurogenic claudication which discussed he should talk with his PCP. focused on core and hip strengthening which he reported no pain or discomfort. end of session he reported no pain.      PT Treatment/Interventions  Iontophoresis 4mg /ml Dexamethasone;Moist Heat;Dry needling;Manual techniques;Patient/family education;Therapeutic exercise;Ultrasound;Therapeutic activities;Taping    PT Next Visit Plan  Manual, modalities, HEP review, core and hip strengthening,     PT Home Exercise Plan  quadratus stretch on side and child's pose,  knee to chest RT/Lt shoulder, tennis ball STW, LTR and calf stretch, lift/ chop    Consulted and Agree with Plan of Care  Patient       Patient will benefit from skilled therapeutic intervention in order to improve the following deficits and impairments:  Pain, Postural dysfunction, Increased muscle spasms, Decreased strength, Decreased range of motion, Difficulty walking  Visit Diagnosis: Muscle weakness (generalized)  Muscle spasm of back  Abnormal posture  Acute left-sided low back pain, with sciatica presence unspecified     Problem List Patient Active Problem List   Diagnosis Date Noted  . Emphysema lung ([place]) 12/29/2017   Starr Lake PT, DPT, LAT, ATC  04/11/18  12:26 PM      [place] [facility] [address] [place], [place], [address] Phone: [phone]   Fax:  [phone]  Name: Sean [name] MRN: [id number] Date of Birth: [birth year]

## 2018-04-12 ENCOUNTER — Encounter (HOSPITAL_COMMUNITY): Payer: No Typology Code available for payment source

## 2018-04-17 ENCOUNTER — Encounter (HOSPITAL_COMMUNITY): Payer: No Typology Code available for payment source

## 2018-04-19 ENCOUNTER — Encounter (HOSPITAL_COMMUNITY): Payer: No Typology Code available for payment source

## 2018-04-23 ENCOUNTER — Ambulatory Visit: Payer: No Typology Code available for payment source | Attending: Family Medicine

## 2018-04-23 DIAGNOSIS — R293 Abnormal posture: Secondary | ICD-10-CM

## 2018-04-23 DIAGNOSIS — M62830 Muscle spasm of back: Secondary | ICD-10-CM | POA: Diagnosis present

## 2018-04-23 DIAGNOSIS — M6281 Muscle weakness (generalized): Secondary | ICD-10-CM

## 2018-04-23 DIAGNOSIS — M545 Low back pain: Secondary | ICD-10-CM | POA: Diagnosis present

## 2018-04-23 NOTE — Therapy (Signed)
[place], [place], [address] Phone: [phone]   Fax:  [phone]  Physical Therapy Treatment  Patient Details  Name: Sean [name] MRN: [id number] Date of Birth: 10-Jan-1951 Referring Provider: Rolla Etienne  PA-C   Encounter Date: 04/23/2018  PT End of Session - 04/23/18 1111    Visit Number  9    Number of Visits  15    Date for PT Re-Evaluation  04/27/18    Authorization Type  [facility]    Authorization Time Period  progress visit 10       Authorization - Visit Number  9    Authorization - Number of Visits  15    PT Start Time  1103    PT Stop Time  1146    PT Time Calculation (min)  43 min    Activity Tolerance  Patient tolerated treatment well    Behavior During Therapy  [facility] for tasks assessed/performed       Past Medical History:  Diagnosis Date  . Arthritis   . COPD (chronic obstructive pulmonary disease) ([place])   . Hypertension     History reviewed. No pertinent surgical history.  There were no vitals filed for this visit.  Subjective Assessment - 04/23/18 1110    Pain Score  4     Pain Location  Hip    Pain Orientation  Left    Pain Descriptors / Indicators  Sore    Pain Type  Acute pain    Pain Radiating Towards  LT Lower leg    Pain Onset  More than a month ago    Pain Frequency  Intermittent    Aggravating Factors   activity on feet /walking    Pain Relieving Factors  sit /rest                       [facility] Adult PT Treatment/Exercise - 04/23/18 0001      Lumbar Exercises: Supine   Pelvic Tilt  20 reps   legs on ball exhale with contraction   Other Supine Lumbar Exercises  reen band pulls with arms raised over shoulder and glut lift x 15      Knee/Hip Exercises: Aerobic   Nustep  Bike L2 6 min      Knee/Hip Exercises: Standing   Hip Abduction  20 reps;Right;Left   press to physioball    Hip Extension  Right;Left;20 reps   press to physioball     Knee/Hip  Exercises: Supine   Bridges  20 reps   legs on ball     Manual Therapy   Manual therapy comments  manual trigger point release along l glute medius    Soft tissue mobilization  to gluteals followed by long axis pull.       Also chop and lift in supine green band x 15         PT Short Term Goals - 04/02/18 1227      PT SHORT TERM GOAL #1   Title  He will be independent with iniital HEP    Status  Achieved      PT SHORT TERM GOAL #2   Title  He will report soreness in spine to palpation decr 30% or more    Status  Achieved      PT SHORT TERM GOAL #3   Title  He will report able to walk for 15 min before pain in leg starts  Status  On-going        PT Long Term Goals - 04/23/18 1112      PT LONG TERM GOAL #1   Title  He will report no lower leg symptoms with walking    Status  On-going      PT LONG TERM GOAL #2   Title  He will report able to complete pulmonary rehab walking with 1-2 max LBP.     Baseline  did 16 laps last session which was goal but las tlap side on fire    Status  Partially Met      PT [place] #3   Title  He will be independent with all HEP issued    Status  On-going            Plan - 04/23/18 1112    Clinical Impression Statement  Hip feels better post session. He is improved but continues with significant pain with prolonged walking.  May benefit from another DN session.     PT Treatment/Interventions  Iontophoresis 53m/ml Dexamethasone;Moist Heat;Dry needling;Manual techniques;Patient/family education;Therapeutic exercise;Ultrasound;Therapeutic activities;Taping    PT Next Visit Plan  Manual, modalities, HEP review, core and hip strengthening,      PROGRESS NOTE VISIT 10    PT Home Exercise Plan  quadratus stretch on side and child's pose,  knee to chest RT/Lt shoulder, tennis ball STW, LTR and calf stretch, lift/ chop    Consulted and Agree with Plan of Care  Patient       Patient will benefit from skilled therapeutic  intervention in order to improve the following deficits and impairments:  Pain, Postural dysfunction, Increased muscle spasms, Decreased strength, Decreased range of motion, Difficulty walking  Visit Diagnosis: Muscle weakness (generalized)  Muscle spasm of back  Abnormal posture  Acute left-sided low back pain, with sciatica presence unspecified     Problem List Patient Active Problem List   Diagnosis Date Noted  . Emphysema lung ([place] 12/29/2017    CDarrel Hoover PT 04/23/2018, 11:44 AM  [facility] [address]Phone: [phone]  Fax:  [phone] Name: Sean KapustaMRN: [id number] of Birth: [birth year]

## 2018-04-24 ENCOUNTER — Encounter (HOSPITAL_COMMUNITY): Payer: No Typology Code available for payment source

## 2018-04-25 ENCOUNTER — Ambulatory Visit: Payer: No Typology Code available for payment source

## 2018-04-25 DIAGNOSIS — M545 Low back pain: Secondary | ICD-10-CM

## 2018-04-25 DIAGNOSIS — M6281 Muscle weakness (generalized): Secondary | ICD-10-CM | POA: Diagnosis not present

## 2018-04-25 DIAGNOSIS — M62830 Muscle spasm of back: Secondary | ICD-10-CM

## 2018-04-25 DIAGNOSIS — R293 Abnormal posture: Secondary | ICD-10-CM

## 2018-04-25 NOTE — Therapy (Signed)
[place], [place], [address] Phone: [phone]   Fax:  [phone]  Physical Therapy Treatment  Patient Details  Name: Sean [name] MRN: [id number] Date of Birth: 01/12/1951 Referring Provider: Rolla Etienne  PA-C  Progress Note Reporting Period 03/07/18   to 04/25/18  See note below for Objective Data and Assessment of Progress/Goals.      Encounter Date: 04/25/2018  PT End of Session - 04/25/18 0933    Visit Number  10    Number of Visits  15    Date for PT Re-Evaluation  04/27/18    Authorization Type  VA    Authorization Time Period  progress visit 10       Authorization - Visit Number  10    Authorization - Number of Visits  15    PT Start Time  0930    PT Stop Time  1008    PT Time Calculation (min)  38 min    Activity Tolerance  Patient tolerated treatment well    Behavior During Therapy  [facility] for tasks assessed/performed       Past Medical History:  Diagnosis Date  . Arthritis   . COPD (chronic obstructive pulmonary disease) ([place])   . Hypertension     History reviewed. No pertinent surgical history.  There were no vitals filed for this visit.  Subjective Assessment - 04/25/18 0930    Subjective  Pain 1. Doing fine    Pain Score  1     Pain Location  Back    Pain Orientation  Left                       [facility] Adult PT Treatment/Exercise - 04/25/18 0001      Lumbar Exercises: Stretches   Lower Trunk Rotation Limitations  x 10 RT /Lt      Lumbar Exercises: Supine   Pelvic Tilt  --   20 reps 5 sec hold   Bridge  15 reps   legs on ball   Other Supine Lumbar Exercises  green band pulls with arms raised over shoulder and glut lift x 15, then green band chop /lift RT/LT       Knee/Hip Exercises: Aerobic   Nustep  Bike L3 6 min   pain incr today but not last session     Knee/Hip Exercises: Standing   Hip Abduction  20 reps;Right;Left   press to ball   Hip  Extension  Right;Left;20 reps   press to ball      Knee/Hip Exercises: Supine   Bridges  15 reps   legs on ball     Manual Therapy   Manual therapy comments  manual trigger point release along l glute medius  and leg pulls     Soft tissue mobilization  to gluteals followed by long axis pull.                PT Short Term Goals - 04/02/18 1227      PT SHORT TERM GOAL #1   Title  He will be independent with iniital HEP    Status  Achieved      PT SHORT TERM GOAL #2   Title  He will report soreness in spine to palpation decr 30% or more    Status  Achieved      PT SHORT TERM GOAL #3   Title  He will report able to walk for 15 min  before pain in leg starts    Status  On-going        PT Long Term Goals - 04/23/18 1112      PT LONG TERM GOAL #1   Title  He will report no lower leg symptoms with walking    Status  On-going      PT LONG TERM GOAL #2   Title  He will report able to complete pulmonary rehab walking with 1-2 max LBP.     Baseline  did 16 laps last session which was goal but las tlap side on fire    Status  Partially Met      PT [place] #3   Title  He will be independent with all HEP issued    Status  On-going            Plan - 04/25/18 0934    Clinical Impression Statement  comes in with little pain and left with no pain. Felt looser with manual treatement. Still tender in muscle in hip laterally and some posteior.  DN he reports is helpful so hopfully will have agin. Nustep for warm up s bilke this time caused increase pain . Suggested he stop if pain incr to 3/10 and feel it will continue to increase    PT Treatment/Interventions  Iontophoresis 40m/ml Dexamethasone;Moist Heat;Dry needling;Manual techniques;Patient/family education;Therapeutic exercise;Ultrasound;Therapeutic activities;Taping    PT Next Visit Plan  Manual, modalities, HEP review, core and hip strengthening,     DN    PT Home Exercise Plan  quadratus stretch on side and  child's pose,  knee to chest RT/Lt shoulder, tennis ball STW, LTR and calf stretch, lift/ chop    Consulted and Agree with Plan of Care  Patient       Patient will benefit from skilled therapeutic intervention in order to improve the following deficits and impairments:  Pain, Postural dysfunction, Increased muscle spasms, Decreased strength, Decreased range of motion, Difficulty walking  Visit Diagnosis: Muscle weakness (generalized)  Muscle spasm of back  Acute left-sided low back pain, with sciatica presence unspecified  Abnormal posture     Problem List Patient Active Problem List   Diagnosis Date Noted  . Emphysema lung ([place] 12/29/2017    CDarrel Hoover PT 04/25/2018, 10:09 AM  [facility] [address]Phone: [phone]  Fax:  [phone] Name: TDavontae PrusinskiMRN: [id number] of Birth: [birth year]

## 2018-04-26 ENCOUNTER — Encounter (HOSPITAL_COMMUNITY): Payer: No Typology Code available for payment source

## 2018-04-30 ENCOUNTER — Encounter: Payer: Self-pay | Admitting: Physical Therapy

## 2018-04-30 ENCOUNTER — Ambulatory Visit: Payer: No Typology Code available for payment source | Admitting: Physical Therapy

## 2018-04-30 DIAGNOSIS — M545 Low back pain: Secondary | ICD-10-CM

## 2018-04-30 DIAGNOSIS — M6281 Muscle weakness (generalized): Secondary | ICD-10-CM | POA: Diagnosis not present

## 2018-04-30 DIAGNOSIS — M62830 Muscle spasm of back: Secondary | ICD-10-CM

## 2018-04-30 DIAGNOSIS — R293 Abnormal posture: Secondary | ICD-10-CM

## 2018-04-30 NOTE — Therapy (Signed)
[place], [place], [address] Phone: [phone]   Fax:  [phone]  Physical Therapy Treatment / Re-certification  Patient Details  Name: Sean [name] MRN: [id number] Date of Birth: June 03, 1951 Referring Provider: Rolla Etienne  PA-C   Encounter Date: 04/30/2018  PT End of Session - 04/30/18 0802    Visit Number  11    Number of Visits  15    Date for PT Re-Evaluation  05/14/18    Authorization Type  [facility]    PT Start Time  0802    PT Stop Time  0846    PT Time Calculation (min)  44 min    Activity Tolerance  Patient tolerated treatment well    Behavior During Therapy  [facility] for tasks assessed/performed       Past Medical History:  Diagnosis Date  . Arthritis   . COPD (chronic obstructive pulmonary disease) ([place])   . Hypertension     History reviewed. No pertinent surgical history.  There were no vitals filed for this visit.  Subjective Assessment - 04/30/18 0804    Subjective  "It doesn't take long for the pain to start in the hip, which requires me to have to sit and rest"     Currently in Pain?  Yes    Pain Score  2     Pain Orientation  Left    Pain Descriptors / Indicators  Aching;Sore    Pain Type  Acute pain    Pain Onset  More than a month ago    Pain Frequency  Intermittent    Aggravating Factors   standing, walking     Pain Relieving Factors  sit/ resting         [facility] PT Assessment - 04/30/18 0807      Assessment   Medical Diagnosis  Lower back pain      AROM   Lumbar Flexion  80    Lumbar Extension  21    Lumbar - Right Side Bend  22    Lumbar - Left Side Bend  20      Special Tests    Special Tests  Sacrolliac Tests    Sacroiliac Tests   Sacral Thrust                   [facility] Adult PT Treatment/Exercise - 04/30/18 0816      Exercises   Exercises  Knee/Hip;Lumbar      Lumbar Exercises: Stretches   Active Hamstring Stretch  3 reps;Right;30 seconds   PNF  contract/ relax with 10 sec contraction   Lower Trunk Rotation Limitations  2 x 10    Other Lumbar Stretch Exercise  seated low back stretch working hands down legs 2 x 30 sec      Lumbar Exercises: Supine   Straight Leg Raise  10 reps   x 2 sets     Manual Therapy   Manual Therapy  Muscle Energy Technique;Joint mobilization    Manual therapy comments  skilled palpation and monitoring during TPDN    Joint Mobilization  LAD grade 3 on LLE only    Soft tissue mobilization  L glute med/ min     Muscle Energy Technique  L isometric hip flexor activation 2 x 5 with 10 sec hold       Trigger Point Dry Needling - 04/30/18 0826    Consent Given?  Yes    Education Handout Provided  No   given  previously   Muscles Treated Lower Body  Gluteus minimus   and medius   Gluteus Minimus Response  Twitch response elicited;Palpable increased muscle length   L only          PT Education - 04/30/18 0836    Education Details  reviewed todays assessemnt regarding todays assessment and involvement of SIJ and muscles effecting it. updated HEP    Person(s) Educated  Patient    Methods  Explanation;Verbal cues;Handout    Comprehension  Verbalized understanding;Verbal cues required       PT Short Term Goals - 04/30/18 0849      PT SHORT TERM GOAL #3   Title  He will report able to walk for 15 min before pain in leg starts    Time  3    Period  Weeks    Status  On-going        PT Long Term Goals - 04/30/18 0848      PT LONG TERM GOAL #1   Title  He will report no lower leg symptoms with walking    Time  6    Period  Weeks    Status  On-going      PT LONG TERM GOAL #2   Title  He will report able to complete pulmonary rehab walking with 1-2 max LBP.     Time  6    Period  Weeks    Status  Partially Met      PT LONG TERM GOAL #3   Title  He will be independent with all HEP issued    Time  6    Period  Weeks    Status  On-going            Plan - 04/30/18 0841     Clinical Impression Statement  Mr Moody continues to make progress with intermittent pain inthe hip with standing. further assessment today revealed involvement of the l SIJ with postive testing for posterior innominate rotation. He demonstrated improvement in pain and trunk mobility following hamstring stretching and MET of the hip flexor. updated HEP today. plan to see pt one more visit if he continues to do well then discharge, if more visits are warranted then possibly drop down to 1 x a week for the next 2-3 weeks to finalize HEP    Rehab Potential  Good    PT Frequency  1x / week    PT Duration  3 weeks    PT Next Visit Plan  Manual, modalities, HEP review, core and hip strengthening, response to HEP    PT Home Exercise Plan  quadratus stretch on side and child's pose,  knee to chest RT/Lt shoulder, tennis ball STW, LTR and calf stretch, lift/ chop, hamstring stretching, meT for L hip flexors, SLR, low back stretch in sitting    Consulted and Agree with Plan of Care  Patient       Patient will benefit from skilled therapeutic intervention in order to improve the following deficits and impairments:  Pain, Postural dysfunction, Increased muscle spasms, Decreased strength, Decreased range of motion, Difficulty walking  Visit Diagnosis: Muscle weakness (generalized)  Muscle spasm of back  Acute left-sided low back pain, with sciatica presence unspecified  Abnormal posture     Problem List Patient Active Problem List   Diagnosis Date Noted  . Emphysema lung ([place]) 12/29/2017   Starr [place] PT, DPT, LAT, ATC  04/30/18  8:50 AM      [facility] [address] [place], [place], [address] Phone: [phone]   Fax:  [phone]  Name: Sean [name] MRN: [id number] Date of Birth: [birth year]

## 2018-05-01 ENCOUNTER — Encounter (HOSPITAL_COMMUNITY)
Admission: RE | Admit: 2018-05-01 | Discharge: 2018-05-01 | Disposition: A | Discharge status: Home / Self Care | Payer: Self-pay | Source: Ambulatory Visit | Attending: Pulmonary Disease | Admitting: Pulmonary Disease

## 2018-05-01 ENCOUNTER — Encounter (HOSPITAL_COMMUNITY): Payer: No Typology Code available for payment source

## 2018-05-01 DIAGNOSIS — Z7951 Long term (current) use of inhaled steroids: Secondary | ICD-10-CM | POA: Insufficient documentation

## 2018-05-01 DIAGNOSIS — Z87891 Personal history of nicotine dependence: Secondary | ICD-10-CM | POA: Insufficient documentation

## 2018-05-01 DIAGNOSIS — J449 Chronic obstructive pulmonary disease, unspecified: Secondary | ICD-10-CM | POA: Insufficient documentation

## 2018-05-01 DIAGNOSIS — I10 Essential (primary) hypertension: Secondary | ICD-10-CM | POA: Insufficient documentation

## 2018-05-01 DIAGNOSIS — Z79899 Other long term (current) drug therapy: Secondary | ICD-10-CM | POA: Insufficient documentation

## 2018-05-01 DIAGNOSIS — M1990 Unspecified osteoarthritis, unspecified site: Secondary | ICD-10-CM | POA: Insufficient documentation

## 2018-05-02 ENCOUNTER — Encounter: Payer: Self-pay | Admitting: Physical Therapy

## 2018-05-02 ENCOUNTER — Ambulatory Visit: Payer: No Typology Code available for payment source | Admitting: Physical Therapy

## 2018-05-02 DIAGNOSIS — M6281 Muscle weakness (generalized): Secondary | ICD-10-CM

## 2018-05-02 DIAGNOSIS — R293 Abnormal posture: Secondary | ICD-10-CM

## 2018-05-02 DIAGNOSIS — M62830 Muscle spasm of back: Secondary | ICD-10-CM

## 2018-05-02 DIAGNOSIS — M545 Low back pain: Secondary | ICD-10-CM

## 2018-05-02 NOTE — Therapy (Signed)
[place], [place], [address] Phone: [phone]   Fax:  [phone]  Physical Therapy Treatment / Discharge Summary  Patient Details  Name: Sean [name] MRN: [id number] Date of Birth: March 07, 1951 Referring Provider: Rolla Etienne  PA-C   Encounter Date: 05/02/2018  [name] End of Session - 05/02/18 1145    Visit Number  12    Number of Visits  15    Date for [name] Re-Evaluation  05/14/18    [name] Start Time  1101    [name] Stop Time  1144    [name] Time Calculation (min)  43 min    Activity Tolerance  Patient tolerated treatment well    Behavior During Therapy  [facility] for tasks assessed/performed       Past Medical History:  Diagnosis Date  . Arthritis   . COPD (chronic obstructive pulmonary disease) ([place])   . Hypertension     History reviewed. No pertinent surgical history.  There were no vitals filed for this visit.  Subjective Assessment - 05/02/18 1104    Subjective  "I really feel like the last set of execises really helped and I feel like I am doing better'     Currently in Pain?  Yes    Pain Score  --   .5/10   Pain Orientation  Left    Pain Descriptors / Indicators  Sore    Pain Type  Chronic pain    Pain Onset  More than a month ago    Pain Frequency  Intermittent         [name] Assessment - 05/02/18 0001      Assessment   Medical Diagnosis  Lower back pain      AROM   Lumbar Flexion  80    Lumbar Extension  21    Lumbar - Right Side Bend  22    Lumbar - Left Side Bend  20      Special Tests    Special Tests  --    Sacroiliac Tests   --                   [facility] Adult [name] Treatment/Exercise - 05/02/18 1106      Lumbar Exercises: Supine   Bent Knee Raise  20 reps   2 sets while keeping core tight     Lumbar Exercises: Quadruped   [place]/Old Horse  10 reps   5 sec hold     Knee/Hip Exercises: Stretches   Active Hamstring Stretch  3 reps;Left;30 seconds   PNF contract/  relax with 5 sec contraction   ITB Stretch  2 reps;30 seconds   in R sidelying     Knee/Hip Exercises: Aerobic   Nustep  L 5 x 6 min LE only   increased resistance/ time for endurance     Knee/Hip Exercises: Seated   Sit to Sand  2 sets;10 reps   1 set with rest between reps, 1 set with no rest, with focus     Manual Therapy   Manual therapy comments  MTPR along the glute medius x 2    Joint Mobilization  LAD grade 4 on LLE only             [name] Education - 05/02/18 1144    Education Details  reviewed previously provided HEP, updated HEP. importance of continued exercise with gradual progression of reps/ sets to increase endurance.     Person(s) Educated  Patient  Methods  Explanation;Verbal cues;Handout    Comprehension  Verbalized understanding;Verbal cues required       [name] Short Term Goals - 05/02/18 1146      [name] SHORT TERM GOAL #1   Title  He will be independent with iniital HEP    Time  3    Period  Weeks    Status  Achieved      [name] SHORT TERM GOAL #2   Title  He will report soreness in spine to palpation decr 30% or more    Time  3    Period  Weeks    Status  Achieved      [name] SHORT TERM GOAL #3   Title  He will report able to walk for 15 min before pain in leg starts    Baseline  able to walk about 10 min    Time  3    Status  Partially Met        [name] Long Term Goals - 05/02/18 1145      [name] LONG TERM GOAL #1   Title  He will report no lower leg symptoms with walking    Time  6    Period  Weeks    Status  Partially Met      [name] LONG TERM GOAL #2   Title  He will report able to complete pulmonary rehab walking with 1-2 max LBP.     Time  6    Period  Weeks    Status  Partially Met      [name] LONG TERM GOAL #3   Title  He will be independent with all HEP issued    Time  6    Period  Weeks    Status  Achieved            Plan - 05/02/18 1146    Clinical Impression Statement  Mr. Stemmer has made great progress with physical therapy. He  reports today pain was .5/10 and with exercise and stretching no pain. He met or partially met all goals. He continues to demonstrate limited endurance due to pain but is likely endurance related. He is able to maintain and progress his current level of function independenlty and will be discharged from [name] today.     [name] Next Visit Plan  D/C    [name] Home Exercise Plan  quadratus stretch on side and child's pose,  knee to chest RT/Lt shoulder, tennis ball STW, LTR and calf stretch, lift/ chop, hamstring stretching, meT for L hip flexors, SLR, low back stretch in sitting, Cat/Cow    Consulted and Agree with Plan of Care  Patient       Patient will benefit from skilled therapeutic intervention in order to improve the following deficits and impairments:  Pain, Postural dysfunction, Increased muscle spasms, Decreased strength, Decreased range of motion, Difficulty walking  Visit Diagnosis: Muscle weakness (generalized)  Muscle spasm of back  Acute left-sided low back pain, with sciatica presence unspecified  Abnormal posture     Problem List Patient Active Problem List   Diagnosis Date Noted  . Emphysema lung ([place]) 12/29/2017    Sean [name] 05/02/2018, 11:49 AM  [facility] [address] [place], [place], [address] Phone: [phone]   Fax:  [phone]  Name: Sean [name] MRN: [id number] Date of Birth: 09-03-1950     PHYSICAL THERAPY DISCHARGE SUMMARY  Visits from Start of Care: 12  Current functional level related to goals /  functional outcomes: See goals   Remaining deficits: Intermittent soreness noted with prolonged walking likely related to fatigue with endurance.    Education / Equipment: HEP, theraband, posture, anatomy of hip/ SIJ,   Plan: Patient agrees to discharge.  Patient goals were partially met. Patient is being discharged due to being pleased with the current functional level.  ?????           Sean [name], Sean [name], Sean [name], Sean [name]  05/02/18  11:50 AM

## 2018-05-03 ENCOUNTER — Encounter (HOSPITAL_COMMUNITY): Payer: No Typology Code available for payment source

## 2018-05-03 ENCOUNTER — Encounter (HOSPITAL_COMMUNITY)
Admission: RE | Admit: 2018-05-03 | Discharge: 2018-05-03 | Disposition: A | Discharge status: Home / Self Care | Payer: Self-pay | Source: Ambulatory Visit | Attending: Pulmonary Disease | Admitting: Pulmonary Disease

## 2018-05-08 ENCOUNTER — Encounter (HOSPITAL_COMMUNITY)
Admission: RE | Admit: 2018-05-08 | Discharge: 2018-05-08 | Disposition: A | Discharge status: Home / Self Care | Payer: Self-pay | Source: Ambulatory Visit | Attending: Pulmonary Disease | Admitting: Pulmonary Disease

## 2018-05-08 ENCOUNTER — Encounter (HOSPITAL_COMMUNITY): Payer: No Typology Code available for payment source

## 2018-05-10 ENCOUNTER — Encounter (HOSPITAL_COMMUNITY)
Admission: RE | Admit: 2018-05-10 | Discharge: 2018-05-10 | Disposition: A | Discharge status: Home / Self Care | Payer: Self-pay | Source: Ambulatory Visit | Attending: Pulmonary Disease | Admitting: Pulmonary Disease

## 2018-05-10 ENCOUNTER — Encounter (HOSPITAL_COMMUNITY): Payer: No Typology Code available for payment source

## 2018-05-15 ENCOUNTER — Encounter (HOSPITAL_COMMUNITY)
Admission: RE | Admit: 2018-05-15 | Discharge: 2018-05-15 | Disposition: A | Discharge status: Home / Self Care | Payer: Self-pay | Source: Ambulatory Visit | Attending: Pulmonary Disease | Admitting: Pulmonary Disease

## 2018-05-15 ENCOUNTER — Encounter (HOSPITAL_COMMUNITY): Payer: Self-pay

## 2018-05-15 DIAGNOSIS — I10 Essential (primary) hypertension: Secondary | ICD-10-CM | POA: Insufficient documentation

## 2018-05-15 DIAGNOSIS — Z87891 Personal history of nicotine dependence: Secondary | ICD-10-CM | POA: Insufficient documentation

## 2018-05-15 DIAGNOSIS — Z7951 Long term (current) use of inhaled steroids: Secondary | ICD-10-CM | POA: Insufficient documentation

## 2018-05-15 DIAGNOSIS — Z79899 Other long term (current) drug therapy: Secondary | ICD-10-CM | POA: Insufficient documentation

## 2018-05-15 DIAGNOSIS — M1990 Unspecified osteoarthritis, unspecified site: Secondary | ICD-10-CM | POA: Insufficient documentation

## 2018-05-15 DIAGNOSIS — J449 Chronic obstructive pulmonary disease, unspecified: Secondary | ICD-10-CM | POA: Insufficient documentation

## 2018-05-17 ENCOUNTER — Encounter (HOSPITAL_COMMUNITY)
Admission: RE | Admit: 2018-05-17 | Discharge: 2018-05-17 | Disposition: A | Discharge status: Home / Self Care | Payer: Self-pay | Source: Ambulatory Visit | Attending: Pulmonary Disease | Admitting: Pulmonary Disease

## 2018-05-17 ENCOUNTER — Encounter (HOSPITAL_COMMUNITY): Payer: Self-pay

## 2018-05-18 NOTE — Progress Notes (Signed)
Discharge Progress Report  Patient Details  Name: Casten Floren MRN: [id number] Date of Birth: 15-May-1951 Referring Provider:     Pulmonary Rehab Walk Test from 01/02/2018 in [place]  Referring Provider  Dr. Gwenette Greet       Number of Visits: 22   Reason for Discharge:  Patient reached a stable level of exercise. Patient independent in their exercise. Patient has met program and personal goals.  Smoking History:  Social History   Tobacco Use  Smoking Status Former Smoker  . Types: Cigarettes  . Last attempt to quit: 06/25/2013  . Years since quitting: 4.8  Smokeless Tobacco Former Systems developer  . Quit date: 06/25/1976    Diagnosis:  Chronic obstructive pulmonary disease, unspecified COPD type [facility])  ADL UCSD: Pulmonary Assessment Scores    Row Name 01/02/18 1633 01/23/18 0935 04/11/18 1508     ADL UCSD   ADL Phase  Entry  Entry  Exit   SOB Score total  -  46  37     CAT Score   CAT Score  -  10  9     mMRC Score   mMRC Score  3  -  -   Row Name 04/11/18 1520         ADL UCSD   ADL Phase  Entry     SOB Score total  46       CAT Score   CAT Score  10 entry        Initial Exercise Prescription: Initial Exercise Prescription - 01/02/18 1600      Date of Initial Exercise RX and Referring Provider   Date  01/02/18    Referring Provider  Dr. Gwenette Greet      Oxygen   Oxygen  --      Bike   Level  0.4    Minutes  17      NuStep   Level  2    SPM  80    Minutes  17    METs  1.5      Track   Laps  10    Minutes  17      Prescription Details   Frequency (times per week)  2    Duration  Progress to 45 minutes of aerobic exercise without signs/symptoms of physical distress      Intensity   THRR 40-80% of Max Heartrate  62-124    Ratings of Perceived Exertion  11-13    Perceived Dyspnea  0-4      Progression   Progression  Continue progressive overload as per policy without signs/symptoms or physical distress.       Resistance Training   Training Prescription  Yes    Weight  blue bands    Reps  10-15       Discharge Exercise Prescription (Final Exercise Prescription Changes): Exercise Prescription Changes - 04/03/18 1500      Response to Exercise   Blood Pressure (Admit)  126/70    Blood Pressure (Exercise)  142/70    Blood Pressure (Exit)  126/78    Heart Rate (Admit)  77 bpm    Heart Rate (Exercise)  98 bpm    Heart Rate (Exit)  82 bpm    Oxygen Saturation (Admit)  93 %    Oxygen Saturation (Exercise)  89 %    Oxygen Saturation (Exit)  91 %    Rating of Perceived Exertion (Exercise)  15    Perceived Dyspnea (Exercise)  3    Duration  Continue with 45 min of aerobic exercise without signs/symptoms of physical distress.    Intensity  THRR unchanged      Progression   Progression  Continue to progress workloads to maintain intensity without signs/symptoms of physical distress.      Resistance Training   Training Prescription  No    Weight  blue bands    Reps  10-15    Time  10 Minutes      Interval Training   Interval Training  No      Bike   Level  1.1    Minutes  17      NuStep   Level  6    SPM  80    Minutes  17    METs  3.7      Track   Laps  9    Minutes  17       Functional Capacity: [place] Name 01/02/18 1635 04/12/18 0725       6 Minute Walk   Phase  Initial  Discharge    Distance  1443 feet  1500 feet    Distance Feet Change  -  57 ft    Walk Time  6 minutes  6 minutes    # of Rest Breaks  0  0    MPH  2.73  2.84    METS  3.07  3.14    RPE  13  17    Perceived Dyspnea   2  3    Symptoms  Yes (comment)  No    Comments  LEFT CALF BURNING  burning in calves    Resting HR  76 bpm  93 bpm    Resting BP  130/80  130/78    Resting Oxygen Saturation   92 %  93 %    Exercise Oxygen Saturation  during 6 min walk  89 %  90 %    Max Ex. HR  114 bpm  113 bpm    Max Ex. BP  152/84  140/80      Interval HR   1 Minute HR  88  100    2 Minute HR   90  113    3 Minute HR  82  113    4 Minute HR  87  109    5 Minute HR  88  103    6 Minute HR  114  103    2 Minute Post HR  82  95    Interval Heart Rate?  Yes  Yes      Interval Oxygen   Interval Oxygen?  Yes  Yes    Baseline Oxygen Saturation %  92 %  93 %    1 Minute Oxygen Saturation %  93 %  90 %    1 Minute Liters of Oxygen  0 L  0 L    2 Minute Oxygen Saturation %  89 %  90 %    2 Minute Liters of Oxygen  0 L  0 L    3 Minute Oxygen Saturation %  89 %  90 %    3 Minute Liters of Oxygen  0 L  0 L    4 Minute Oxygen Saturation %  89 %  90 %    4 Minute Liters of Oxygen  0 L  0 L    5 Minute Oxygen Saturation %  90 %  90 %    5 Minute Liters of Oxygen  0 L  0 L    6 Minute Oxygen Saturation %  89 %  90 %    6 Minute Liters of Oxygen  0 L  0 L    2 Minute Post Oxygen Saturation %  95 %  92 %    2 Minute Post Liters of Oxygen  0 L  0 L       Psychological, QOL, Others - Outcomes: PHQ 2/9: Depression screen [facility] 2/9 04/10/2018 12/29/2017 10/29/2015 06/29/2015  Decreased Interest 0 0 0 1  Down, Depressed, Hopeless 0 0 0 0  PHQ - 2 Score 0 0 0 1  Altered sleeping 0 0 - -  Tired, decreased energy 1 1 - -  Change in appetite 0 0 - -  Feeling bad or failure about yourself  0 0 - -  Trouble concentrating 0 0 - -  Moving slowly or fidgety/restless 0 0 - -  Suicidal thoughts 0 0 - -  PHQ-9 Score 1 1 - -  Difficult doing work/chores Somewhat difficult Somewhat difficult - -    Quality of Life:   Personal Goals: Goals established at orientation with interventions provided to work toward goal. Personal Goals and Risk Factors at Admission - 12/29/17 1024      Core Components/Risk Factors/Patient Goals on Admission    Weight Management  Weight Loss    Improve shortness of breath with ADL's  Yes    Intervention  Provide education, individualized exercise plan and daily activity instruction to help decrease symptoms of SOB with activities of daily living.    Expected Outcomes   Short Term: Improve cardiorespiratory fitness to achieve a reduction of symptoms when performing ADLs    Hypertension  Yes    Intervention  Provide education on lifestyle modifcations including regular physical activity/exercise, weight management, moderate sodium restriction and increased consumption of fresh fruit, vegetables, and low fat dairy, alcohol moderation, and smoking cessation.;Monitor prescription use compliance.    Expected Outcomes  Short Term: Continued assessment and intervention until BP is < 140/41m HG in hypertensive participants. < 130/831mHG in hypertensive participants with diabetes, heart failure or chronic kidney disease.;Long Term: Maintenance of blood pressure at goal levels.        Personal Goals Discharge: Goals and Risk Factor Review    Row Name 01/17/18 1220 02/13/18 1144 03/06/18 1038 04/04/18 1250       Core Components/Risk Factors/Patient Goals Review   Personal Goals Review  Weight Management/Obesity;Improve shortness of breath with ADL's;Hypertension  Weight Management/Obesity;Improve shortness of breath with ADL's;Hypertension  Weight Management/Obesity;Improve shortness of breath with ADL's;Hypertension  Weight Management/Obesity;Improve shortness of breath with ADL's;Hypertension;Develop more efficient breathing techniques such as purse lipped breathing and diaphragmatic breathing and practicing self-pacing with activity.    Review  Pt is off to a good start in pulmonary rehab.  Pt weight remains the same however pt has only attended 3 exercise sessions.  Pt is learning PLB and diaphragmatic breathing techniques.  Pt needs cues but is able to demonstrate appropriately.  Pt o2 sat do hoover in the upper 80's when ambulating on the track. Pt with continued elevation in BP Pre exercise despite compliance with his medications amd diet. However his BP post exercise is generally lower than pre.  I anticipate that with continued participation in the pulmonary rehab  program  toward meating his goals.    Progressing well, would like to see a little more effort,  second time through program, 1.0 on airdyne, 16 laps on track, level 4 on nustep  Pt has completed 13 exercise sessions. Progressing well, Rehab staff are seeing more effort, second time through program. Pt workloads show  1.0 on airdyne, 16 laps on track, level 4 on nustep.  Pt shows .9 kg decrease since begining the program.  Pt finds it difficult to engage in diaphragmatic breathing due to large abdominal area.  Pt has completed 22 exercise sessions and received extension on his authorization from the [place] . Rehab staff are seeing more effort, second time through program. Pt workloads show increase to 1.1 on airdyne, averages 13-15 laps on track which is a decrease since last 30 day review. Increase to  level 6 on nustep.  Pt shows .9 kg decrease since begining the program.  Pt finds it difficult to engage in diaphragmatic breathing due to large abdominal area. Pt plans to graduate in the next couple of weeks. Will emphasis consistent home exericise.    Expected Outcomes  -  see admission outcomes/goals  see admission outcomes/goals  See Admission Outcomes/Goals       Exercise Goals and Review: Exercise Goals    Row Name 12/29/17 1025             Exercise Goals   Increase Physical Activity  Yes       Intervention  Provide advice, education, support and counseling about physical activity/exercise needs.;Develop an individualized exercise prescription for aerobic and resistive training based on initial evaluation findings, risk stratification, comorbidities and participant's personal goals.       Expected Outcomes  Short Term: Attend rehab on a regular basis to increase amount of physical activity.;Long Term: Add in home exercise to make exercise part of routine and to increase amount of physical activity.;Long Term: Exercising regularly at least 3-5 days a week.       Increase Strength and Stamina  Yes        Intervention  Provide advice, education, support and counseling about physical activity/exercise needs.;Develop an individualized exercise prescription for aerobic and resistive training based on initial evaluation findings, risk stratification, comorbidities and participant's personal goals.       Expected Outcomes  Short Term: Increase workloads from initial exercise prescription for resistance, speed, and METs.;Short Term: Perform resistance training exercises routinely during rehab and add in resistance training at home;Long Term: Improve cardiorespiratory fitness, muscular endurance and strength as measured by increased METs and functional capacity (6MWT)       Able to understand and use rate of perceived exertion (RPE) scale  Yes       Intervention  Provide education and explanation on how to use RPE scale       Expected Outcomes  Short Term: Able to use RPE daily in rehab to express subjective intensity level;Long Term:  Able to use RPE to guide intensity level when exercising independently       Able to understand and use Dyspnea scale  Yes       Intervention  Provide education and explanation on how to use Dyspnea scale       Expected Outcomes  Short Term: Able to use Dyspnea scale daily in rehab to express subjective sense of shortness of breath during exertion;Long Term: Able to use Dyspnea scale to guide intensity level when exercising independently       Knowledge and understanding of Target Heart Rate Range (THRR)  Yes       Intervention  Provide education and  explanation of THRR including how the numbers were predicted and where they are located for reference       Expected Outcomes  Short Term: Able to state/look up THRR;Long Term: Able to use THRR to govern intensity when exercising independently;Short Term: Able to use daily as guideline for intensity in rehab       Understanding of Exercise Prescription  Yes       Intervention  Provide education, explanation, and written materials  on patient's individual exercise prescription       Expected Outcomes  Short Term: Able to explain program exercise prescription;Long Term: Able to explain home exercise prescription to exercise independently          Nutrition & Weight - Outcomes: Pre Biometrics - 01/09/18 1601      Pre Biometrics   Weight  92.2 kg    BMI (Calculated)  33.06        Nutrition: Nutrition Therapy & Goals - 01/15/18 1037      Nutrition Therapy   Diet  General, healthful      Personal Nutrition Goals   Nutrition Goal  Wt loss of 1-2 lb/week to a wt loss goal of 6-24 lb at graduation from Pulmonary Rehab    Personal Goal #2  The pt will recognize symptoms that can interfere with adequate oral intake, such as shortness of breath, N/V, early satiety, fatigue, ability to secure and prepare food, taste and smell changes, chewing/swallowing difficulties, and/ or pain when eating.      Intervention Plan   Intervention  Prescribe, educate and counsel regarding individualized specific dietary modifications aiming towards targeted core components such as weight, hypertension, lipid management, diabetes, heart failure and other comorbidities.    Expected Outcomes  Short Term Goal: Understand basic principles of dietary content, such as calories, fat, sodium, cholesterol and nutrients.;Long Term Goal: Adherence to prescribed nutrition plan.       Nutrition Discharge: Nutrition Assessments - 04/06/18 0949      Rate Your Plate Scores   Pre Score  53    Post Score  36       Education Questionnaire Score: Knowledge Questionnaire Score - 04/11/18 1519      Knowledge Questionnaire Score   Pre Score  17/18       Goals reviewed with patient. Cherre Huger, BSN Cardiac and Training and development officer

## 2018-05-18 NOTE — Addendum Note (Signed)
Encounter addended by: Rowe Pavy, RN on: 05/18/2018 2:12 AM  Actions taken: Episode resolved, Flowsheet data copied forward, Visit Navigator Flowsheet section accepted, Sign clinical note

## 2018-05-22 ENCOUNTER — Encounter (HOSPITAL_COMMUNITY)
Admission: RE | Admit: 2018-05-22 | Discharge: 2018-05-22 | Disposition: A | Discharge status: Home / Self Care | Payer: Self-pay | Source: Ambulatory Visit | Attending: Pulmonary Disease | Admitting: Pulmonary Disease

## 2018-05-22 ENCOUNTER — Encounter (HOSPITAL_COMMUNITY): Payer: Self-pay

## 2018-05-24 ENCOUNTER — Encounter (HOSPITAL_COMMUNITY): Payer: Self-pay

## 2018-05-24 ENCOUNTER — Encounter (HOSPITAL_COMMUNITY)
Admission: RE | Admit: 2018-05-24 | Discharge: 2018-05-24 | Disposition: A | Discharge status: Home / Self Care | Payer: Self-pay | Source: Ambulatory Visit | Attending: Pulmonary Disease | Admitting: Pulmonary Disease

## 2018-05-29 ENCOUNTER — Encounter (HOSPITAL_COMMUNITY)
Admission: RE | Admit: 2018-05-29 | Discharge: 2018-05-29 | Disposition: A | Discharge status: Home / Self Care | Payer: No Typology Code available for payment source | Source: Ambulatory Visit | Attending: Pulmonary Disease | Admitting: Pulmonary Disease

## 2018-05-29 ENCOUNTER — Encounter (HOSPITAL_COMMUNITY): Payer: Self-pay

## 2018-05-31 ENCOUNTER — Encounter (HOSPITAL_COMMUNITY): Payer: No Typology Code available for payment source

## 2018-06-05 ENCOUNTER — Encounter (HOSPITAL_COMMUNITY): Payer: No Typology Code available for payment source

## 2018-06-06 ENCOUNTER — Encounter (HOSPITAL_COMMUNITY): Payer: Self-pay | Admitting: *Deleted

## 2018-06-06 NOTE — Progress Notes (Signed)
Sean [name] started Pulmonary Maintenance program 05/01/18. He has been tolerating exercise well without any problems.

## 2018-06-07 ENCOUNTER — Encounter (HOSPITAL_COMMUNITY): Payer: No Typology Code available for payment source

## 2018-06-12 ENCOUNTER — Encounter (HOSPITAL_COMMUNITY)
Admission: RE | Admit: 2018-06-12 | Discharge: 2018-06-12 | Disposition: A | Discharge status: Home / Self Care | Payer: No Typology Code available for payment source | Source: Ambulatory Visit | Attending: Pulmonary Disease | Admitting: Pulmonary Disease

## 2018-06-14 ENCOUNTER — Encounter (HOSPITAL_COMMUNITY)
Admission: RE | Admit: 2018-06-14 | Discharge: 2018-06-14 | Disposition: A | Discharge status: Home / Self Care | Payer: Self-pay | Source: Ambulatory Visit | Attending: Pulmonary Disease | Admitting: Pulmonary Disease

## 2018-06-19 ENCOUNTER — Encounter (HOSPITAL_COMMUNITY): Payer: No Typology Code available for payment source

## 2018-06-19 DIAGNOSIS — J449 Chronic obstructive pulmonary disease, unspecified: Secondary | ICD-10-CM | POA: Insufficient documentation

## 2018-06-19 DIAGNOSIS — I10 Essential (primary) hypertension: Secondary | ICD-10-CM | POA: Insufficient documentation

## 2018-06-19 DIAGNOSIS — M1990 Unspecified osteoarthritis, unspecified site: Secondary | ICD-10-CM | POA: Insufficient documentation

## 2018-06-19 DIAGNOSIS — Z7951 Long term (current) use of inhaled steroids: Secondary | ICD-10-CM | POA: Insufficient documentation

## 2018-06-19 DIAGNOSIS — Z87891 Personal history of nicotine dependence: Secondary | ICD-10-CM | POA: Insufficient documentation

## 2018-06-19 DIAGNOSIS — Z79899 Other long term (current) drug therapy: Secondary | ICD-10-CM | POA: Insufficient documentation

## 2018-06-21 ENCOUNTER — Encounter (HOSPITAL_COMMUNITY)
Admission: RE | Admit: 2018-06-21 | Discharge: 2018-06-21 | Disposition: A | Discharge status: Home / Self Care | Payer: Self-pay | Source: Ambulatory Visit | Attending: Pulmonary Disease | Admitting: Pulmonary Disease

## 2018-06-26 ENCOUNTER — Encounter (HOSPITAL_COMMUNITY)
Admission: RE | Admit: 2018-06-26 | Discharge: 2018-06-26 | Disposition: A | Discharge status: Home / Self Care | Payer: Self-pay | Source: Ambulatory Visit | Attending: Pulmonary Disease | Admitting: Pulmonary Disease

## 2018-06-28 ENCOUNTER — Encounter (HOSPITAL_COMMUNITY)
Admission: RE | Admit: 2018-06-28 | Discharge: 2018-06-28 | Disposition: A | Discharge status: Home / Self Care | Payer: Self-pay | Source: Ambulatory Visit | Attending: Pulmonary Disease | Admitting: Pulmonary Disease

## 2018-07-03 ENCOUNTER — Encounter (HOSPITAL_COMMUNITY): Payer: No Typology Code available for payment source

## 2018-07-05 ENCOUNTER — Encounter (HOSPITAL_COMMUNITY)
Admission: RE | Admit: 2018-07-05 | Discharge: 2018-07-05 | Disposition: A | Discharge status: Home / Self Care | Payer: Self-pay | Source: Ambulatory Visit | Attending: Pulmonary Disease | Admitting: Pulmonary Disease

## 2018-07-10 ENCOUNTER — Encounter (HOSPITAL_COMMUNITY)
Admission: RE | Admit: 2018-07-10 | Discharge: 2018-07-10 | Disposition: A | Discharge status: Home / Self Care | Payer: Self-pay | Source: Ambulatory Visit | Attending: Pulmonary Disease | Admitting: Pulmonary Disease

## 2018-07-17 ENCOUNTER — Encounter (HOSPITAL_COMMUNITY)
Admission: RE | Admit: 2018-07-17 | Discharge: 2018-07-17 | Disposition: A | Discharge status: Home / Self Care | Payer: Self-pay | Source: Ambulatory Visit | Attending: Pulmonary Disease | Admitting: Pulmonary Disease

## 2018-07-17 DIAGNOSIS — Z79899 Other long term (current) drug therapy: Secondary | ICD-10-CM | POA: Insufficient documentation

## 2018-07-17 DIAGNOSIS — Z87891 Personal history of nicotine dependence: Secondary | ICD-10-CM | POA: Insufficient documentation

## 2018-07-17 DIAGNOSIS — M1990 Unspecified osteoarthritis, unspecified site: Secondary | ICD-10-CM | POA: Insufficient documentation

## 2018-07-17 DIAGNOSIS — J449 Chronic obstructive pulmonary disease, unspecified: Secondary | ICD-10-CM | POA: Insufficient documentation

## 2018-07-17 DIAGNOSIS — Z7951 Long term (current) use of inhaled steroids: Secondary | ICD-10-CM | POA: Insufficient documentation

## 2018-07-17 DIAGNOSIS — I10 Essential (primary) hypertension: Secondary | ICD-10-CM | POA: Insufficient documentation

## 2018-07-19 ENCOUNTER — Encounter (HOSPITAL_COMMUNITY)
Admission: RE | Admit: 2018-07-19 | Discharge: 2018-07-19 | Disposition: A | Discharge status: Home / Self Care | Payer: Self-pay | Source: Ambulatory Visit | Attending: Pulmonary Disease | Admitting: Pulmonary Disease

## 2018-07-24 ENCOUNTER — Encounter (HOSPITAL_COMMUNITY)
Admission: RE | Admit: 2018-07-24 | Discharge: 2018-07-24 | Disposition: A | Discharge status: Home / Self Care | Payer: Self-pay | Source: Ambulatory Visit | Attending: Pulmonary Disease | Admitting: Pulmonary Disease

## 2018-07-26 ENCOUNTER — Encounter (HOSPITAL_COMMUNITY)
Admission: RE | Admit: 2018-07-26 | Discharge: 2018-07-26 | Disposition: A | Discharge status: Home / Self Care | Payer: Self-pay | Source: Ambulatory Visit | Attending: Pulmonary Disease | Admitting: Pulmonary Disease

## 2018-07-31 ENCOUNTER — Encounter (HOSPITAL_COMMUNITY)
Admission: RE | Admit: 2018-07-31 | Discharge: 2018-07-31 | Disposition: A | Discharge status: Home / Self Care | Payer: Self-pay | Source: Ambulatory Visit | Attending: Pulmonary Disease | Admitting: Pulmonary Disease

## 2018-08-02 ENCOUNTER — Encounter (HOSPITAL_COMMUNITY)
Admission: RE | Admit: 2018-08-02 | Discharge: 2018-08-02 | Disposition: A | Discharge status: Home / Self Care | Payer: Self-pay | Source: Ambulatory Visit | Attending: Pulmonary Disease | Admitting: Pulmonary Disease

## 2018-08-07 ENCOUNTER — Encounter (HOSPITAL_COMMUNITY)
Admission: RE | Admit: 2018-08-07 | Discharge: 2018-08-07 | Disposition: A | Discharge status: Home / Self Care | Payer: Self-pay | Source: Ambulatory Visit | Attending: Pulmonary Disease | Admitting: Pulmonary Disease

## 2018-08-09 ENCOUNTER — Encounter (HOSPITAL_COMMUNITY): Payer: Self-pay

## 2018-08-14 ENCOUNTER — Encounter (HOSPITAL_COMMUNITY)
Admission: RE | Admit: 2018-08-14 | Discharge: 2018-08-14 | Disposition: A | Discharge status: Home / Self Care | Payer: Self-pay | Source: Ambulatory Visit | Attending: Pulmonary Disease | Admitting: Pulmonary Disease

## 2018-08-21 ENCOUNTER — Encounter (HOSPITAL_COMMUNITY)
Admission: RE | Admit: 2018-08-21 | Discharge: 2018-08-21 | Disposition: A | Discharge status: Home / Self Care | Payer: Self-pay | Source: Ambulatory Visit | Attending: Pulmonary Disease | Admitting: Pulmonary Disease

## 2018-08-21 DIAGNOSIS — Z7951 Long term (current) use of inhaled steroids: Secondary | ICD-10-CM | POA: Insufficient documentation

## 2018-08-21 DIAGNOSIS — J449 Chronic obstructive pulmonary disease, unspecified: Secondary | ICD-10-CM | POA: Insufficient documentation

## 2018-08-21 DIAGNOSIS — Z79899 Other long term (current) drug therapy: Secondary | ICD-10-CM | POA: Insufficient documentation

## 2018-08-21 DIAGNOSIS — Z87891 Personal history of nicotine dependence: Secondary | ICD-10-CM | POA: Insufficient documentation

## 2018-08-21 DIAGNOSIS — M1990 Unspecified osteoarthritis, unspecified site: Secondary | ICD-10-CM | POA: Insufficient documentation

## 2018-08-21 DIAGNOSIS — I10 Essential (primary) hypertension: Secondary | ICD-10-CM | POA: Insufficient documentation

## 2018-08-23 ENCOUNTER — Encounter (HOSPITAL_COMMUNITY)
Admission: RE | Admit: 2018-08-23 | Discharge: 2018-08-23 | Disposition: A | Discharge status: Home / Self Care | Payer: Self-pay | Source: Ambulatory Visit | Attending: Pulmonary Disease | Admitting: Pulmonary Disease

## 2018-08-28 ENCOUNTER — Encounter (HOSPITAL_COMMUNITY)
Admission: RE | Admit: 2018-08-28 | Discharge: 2018-08-28 | Disposition: A | Discharge status: Home / Self Care | Payer: Self-pay | Source: Ambulatory Visit | Attending: Pulmonary Disease | Admitting: Pulmonary Disease

## 2018-08-30 ENCOUNTER — Encounter (HOSPITAL_COMMUNITY)
Admission: RE | Admit: 2018-08-30 | Discharge: 2018-08-30 | Disposition: A | Discharge status: Home / Self Care | Payer: Self-pay | Source: Ambulatory Visit | Attending: Pulmonary Disease | Admitting: Pulmonary Disease

## 2018-09-04 ENCOUNTER — Encounter (HOSPITAL_COMMUNITY)
Admission: RE | Admit: 2018-09-04 | Discharge: 2018-09-04 | Disposition: A | Discharge status: Home / Self Care | Payer: Self-pay | Source: Ambulatory Visit | Attending: Pulmonary Disease | Admitting: Pulmonary Disease

## 2018-09-06 ENCOUNTER — Encounter (HOSPITAL_COMMUNITY): Payer: Self-pay

## 2018-09-11 ENCOUNTER — Encounter (HOSPITAL_COMMUNITY)
Admission: RE | Admit: 2018-09-11 | Discharge: 2018-09-11 | Disposition: A | Discharge status: Home / Self Care | Payer: Self-pay | Source: Ambulatory Visit | Attending: Pulmonary Disease | Admitting: Pulmonary Disease

## 2018-09-13 ENCOUNTER — Encounter (HOSPITAL_COMMUNITY)
Admission: RE | Admit: 2018-09-13 | Discharge: 2018-09-13 | Disposition: A | Discharge status: Home / Self Care | Payer: Self-pay | Source: Ambulatory Visit | Attending: Pulmonary Disease | Admitting: Pulmonary Disease

## 2018-09-18 ENCOUNTER — Encounter (HOSPITAL_COMMUNITY)
Admission: RE | Admit: 2018-09-18 | Discharge: 2018-09-18 | Disposition: A | Discharge status: Home / Self Care | Payer: Self-pay | Source: Ambulatory Visit | Attending: Pulmonary Disease | Admitting: Pulmonary Disease

## 2018-09-18 DIAGNOSIS — M1990 Unspecified osteoarthritis, unspecified site: Secondary | ICD-10-CM | POA: Insufficient documentation

## 2018-09-18 DIAGNOSIS — J449 Chronic obstructive pulmonary disease, unspecified: Secondary | ICD-10-CM | POA: Insufficient documentation

## 2018-09-18 DIAGNOSIS — Z7951 Long term (current) use of inhaled steroids: Secondary | ICD-10-CM | POA: Insufficient documentation

## 2018-09-18 DIAGNOSIS — I10 Essential (primary) hypertension: Secondary | ICD-10-CM | POA: Insufficient documentation

## 2018-09-18 DIAGNOSIS — Z79899 Other long term (current) drug therapy: Secondary | ICD-10-CM | POA: Insufficient documentation

## 2018-09-18 DIAGNOSIS — Z87891 Personal history of nicotine dependence: Secondary | ICD-10-CM | POA: Insufficient documentation

## 2018-09-20 ENCOUNTER — Encounter (HOSPITAL_COMMUNITY): Payer: Self-pay

## 2018-09-25 ENCOUNTER — Encounter (HOSPITAL_COMMUNITY)
Admission: RE | Admit: 2018-09-25 | Discharge: 2018-09-25 | Disposition: A | Discharge status: Home / Self Care | Payer: Self-pay | Source: Ambulatory Visit | Attending: Pulmonary Disease | Admitting: Pulmonary Disease

## 2018-09-27 ENCOUNTER — Encounter (HOSPITAL_COMMUNITY)
Admission: RE | Admit: 2018-09-27 | Discharge: 2018-09-27 | Disposition: A | Discharge status: Home / Self Care | Payer: Self-pay | Source: Ambulatory Visit | Attending: Pulmonary Disease | Admitting: Pulmonary Disease

## 2018-10-02 ENCOUNTER — Encounter (HOSPITAL_COMMUNITY)
Admission: RE | Admit: 2018-10-02 | Discharge: 2018-10-02 | Disposition: A | Discharge status: Home / Self Care | Payer: Self-pay | Source: Ambulatory Visit | Attending: Pulmonary Disease | Admitting: Pulmonary Disease

## 2018-10-04 ENCOUNTER — Encounter (HOSPITAL_COMMUNITY)
Admission: RE | Admit: 2018-10-04 | Discharge: 2018-10-04 | Disposition: A | Discharge status: Home / Self Care | Payer: Self-pay | Source: Ambulatory Visit | Attending: Pulmonary Disease | Admitting: Pulmonary Disease

## 2018-10-09 ENCOUNTER — Encounter (HOSPITAL_COMMUNITY)
Admission: RE | Admit: 2018-10-09 | Discharge: 2018-10-09 | Disposition: A | Discharge status: Home / Self Care | Payer: Self-pay | Source: Ambulatory Visit | Attending: Pulmonary Disease | Admitting: Pulmonary Disease

## 2018-10-11 ENCOUNTER — Encounter (HOSPITAL_COMMUNITY)
Admission: RE | Admit: 2018-10-11 | Discharge: 2018-10-11 | Disposition: A | Discharge status: Home / Self Care | Payer: Self-pay | Source: Ambulatory Visit | Attending: Pulmonary Disease | Admitting: Pulmonary Disease

## 2018-10-16 ENCOUNTER — Encounter (HOSPITAL_COMMUNITY): Payer: Self-pay

## 2018-10-18 ENCOUNTER — Encounter (HOSPITAL_COMMUNITY): Payer: Self-pay

## 2018-10-23 ENCOUNTER — Encounter (HOSPITAL_COMMUNITY): Payer: Self-pay

## 2018-10-25 ENCOUNTER — Encounter (HOSPITAL_COMMUNITY): Payer: Self-pay

## 2018-10-30 ENCOUNTER — Encounter (HOSPITAL_COMMUNITY): Payer: Self-pay

## 2018-11-01 ENCOUNTER — Encounter (HOSPITAL_COMMUNITY): Payer: Self-pay

## 2018-11-06 ENCOUNTER — Encounter (HOSPITAL_COMMUNITY): Payer: Self-pay

## 2018-11-08 ENCOUNTER — Encounter (HOSPITAL_COMMUNITY): Payer: Self-pay

## 2018-11-13 ENCOUNTER — Encounter (HOSPITAL_COMMUNITY): Payer: Self-pay

## 2018-11-15 ENCOUNTER — Encounter (HOSPITAL_COMMUNITY): Payer: Self-pay

## 2018-11-20 ENCOUNTER — Encounter (HOSPITAL_COMMUNITY): Payer: Self-pay

## 2018-11-22 ENCOUNTER — Encounter (HOSPITAL_COMMUNITY): Payer: Self-pay

## 2018-11-27 ENCOUNTER — Encounter (HOSPITAL_COMMUNITY): Payer: Self-pay

## 2018-11-29 ENCOUNTER — Encounter (HOSPITAL_COMMUNITY): Payer: Self-pay

## 2018-12-04 ENCOUNTER — Encounter (HOSPITAL_COMMUNITY): Payer: Self-pay

## 2018-12-06 ENCOUNTER — Encounter (HOSPITAL_COMMUNITY): Payer: Self-pay

## 2018-12-11 ENCOUNTER — Encounter (HOSPITAL_COMMUNITY): Payer: Self-pay

## 2018-12-13 ENCOUNTER — Encounter (HOSPITAL_COMMUNITY): Payer: Self-pay

## 2018-12-18 ENCOUNTER — Encounter (HOSPITAL_COMMUNITY): Payer: Self-pay

## 2018-12-20 ENCOUNTER — Encounter (HOSPITAL_COMMUNITY): Payer: Self-pay

## 2018-12-25 ENCOUNTER — Encounter (HOSPITAL_COMMUNITY): Payer: Self-pay

## 2018-12-27 ENCOUNTER — Encounter (HOSPITAL_COMMUNITY): Payer: Self-pay

## 2019-01-01 ENCOUNTER — Encounter (HOSPITAL_COMMUNITY): Payer: Self-pay

## 2019-01-03 ENCOUNTER — Encounter (HOSPITAL_COMMUNITY): Payer: Self-pay

## 2019-01-08 ENCOUNTER — Encounter (HOSPITAL_COMMUNITY): Payer: Self-pay

## 2019-01-10 ENCOUNTER — Encounter (HOSPITAL_COMMUNITY): Payer: Self-pay

## 2019-01-15 ENCOUNTER — Encounter (HOSPITAL_COMMUNITY): Payer: Self-pay

## 2019-01-17 ENCOUNTER — Encounter (HOSPITAL_COMMUNITY): Payer: Self-pay

## 2019-01-22 ENCOUNTER — Encounter (HOSPITAL_COMMUNITY): Payer: Self-pay

## 2019-01-24 ENCOUNTER — Encounter (HOSPITAL_COMMUNITY): Payer: Self-pay

## 2019-01-29 ENCOUNTER — Encounter (HOSPITAL_COMMUNITY): Payer: Self-pay

## 2019-01-31 ENCOUNTER — Encounter (HOSPITAL_COMMUNITY): Payer: Self-pay

## 2019-02-05 ENCOUNTER — Encounter (HOSPITAL_COMMUNITY): Payer: Self-pay

## 2019-02-07 ENCOUNTER — Encounter (HOSPITAL_COMMUNITY): Payer: Self-pay

## 2019-02-12 ENCOUNTER — Encounter (HOSPITAL_COMMUNITY): Payer: Self-pay

## 2019-02-14 ENCOUNTER — Encounter (HOSPITAL_COMMUNITY): Payer: Self-pay

## 2019-02-19 ENCOUNTER — Encounter (HOSPITAL_COMMUNITY): Payer: Self-pay

## 2019-02-21 ENCOUNTER — Encounter (HOSPITAL_COMMUNITY): Payer: Self-pay

## 2019-02-26 ENCOUNTER — Encounter (HOSPITAL_COMMUNITY): Payer: Self-pay

## 2019-02-28 ENCOUNTER — Encounter (HOSPITAL_COMMUNITY): Payer: Self-pay

## 2019-03-05 ENCOUNTER — Encounter (HOSPITAL_COMMUNITY): Payer: Self-pay

## 2019-03-07 ENCOUNTER — Encounter (HOSPITAL_COMMUNITY): Payer: Self-pay

## 2019-03-12 ENCOUNTER — Encounter (HOSPITAL_COMMUNITY): Payer: Self-pay

## 2019-03-14 ENCOUNTER — Encounter (HOSPITAL_COMMUNITY): Payer: Self-pay

## 2019-03-19 ENCOUNTER — Encounter (HOSPITAL_COMMUNITY): Payer: Self-pay

## 2020-03-04 DIAGNOSIS — M5416 Radiculopathy, lumbar region: Secondary | ICD-10-CM | POA: Insufficient documentation

## 2020-03-06 ENCOUNTER — Other Ambulatory Visit (HOSPITAL_COMMUNITY): Payer: Self-pay | Admitting: Neurosurgery

## 2020-03-06 DIAGNOSIS — M5416 Radiculopathy, lumbar region: Secondary | ICD-10-CM

## 2020-03-15 ENCOUNTER — Ambulatory Visit (HOSPITAL_COMMUNITY)
Admission: RE | Admit: 2020-03-15 | Discharge: 2020-03-15 | Disposition: A | Discharge status: Home / Self Care | Payer: No Typology Code available for payment source | Source: Ambulatory Visit | Attending: Neurosurgery | Admitting: Neurosurgery

## 2020-03-15 ENCOUNTER — Other Ambulatory Visit: Payer: Self-pay

## 2020-03-15 DIAGNOSIS — M5416 Radiculopathy, lumbar region: Secondary | ICD-10-CM | POA: Insufficient documentation

## 2020-09-01 ENCOUNTER — Other Ambulatory Visit: Payer: Self-pay

## 2020-09-01 DIAGNOSIS — I739 Peripheral vascular disease, unspecified: Secondary | ICD-10-CM

## 2020-09-18 ENCOUNTER — Other Ambulatory Visit: Payer: Self-pay

## 2020-09-18 ENCOUNTER — Encounter: Payer: Self-pay | Admitting: Vascular Surgery

## 2020-09-18 ENCOUNTER — Ambulatory Visit (INDEPENDENT_AMBULATORY_CARE_PROVIDER_SITE_OTHER): Payer: No Typology Code available for payment source | Admitting: Vascular Surgery

## 2020-09-18 ENCOUNTER — Ambulatory Visit (HOSPITAL_COMMUNITY)
Admission: RE | Admit: 2020-09-18 | Discharge: 2020-09-18 | Disposition: A | Discharge status: Home / Self Care | Payer: No Typology Code available for payment source | Source: Ambulatory Visit | Attending: Vascular Surgery | Admitting: Vascular Surgery

## 2020-09-18 VITALS — BP 167/92 | HR 66 | Temp 98.2°F | Resp 20 | Ht 65.75 in | Wt 211.0 lb

## 2020-09-18 DIAGNOSIS — I739 Peripheral vascular disease, unspecified: Secondary | ICD-10-CM

## 2020-09-18 NOTE — H&P (View-Only) (Signed)
Patient ID: Sean [name], male   DOB: 11/18/1950, 70 y.o.   MRN: [id number]  Reason for Consult: New Patient (Initial Visit)   Referred by Center, [facility]  Subjective:     HPI:  Sean [name] is a 70 y.o. male former smoker with risk factors for vascular disease hyperlipidemia hypertension.  He also has severe COPD.  He is limited in his walking to the shortness of breath as well as cramping in his calf that occurs at the very short distance 50-100 feet.  It does resolve after resting for a few minutes.  He does not have any tissue loss or ulceration.  He has never had lower extremity intervention.  Denies any history of stroke, TIA or amaurosis.  Does not have any personal or family history of abdominal aortic aneurysm.  Past Medical History:  Diagnosis Date  . Arthritis   . COPD (chronic obstructive pulmonary disease) ([place])   . Hyperlipidemia   . Hypertension    Family History  Problem Relation Age of Onset  . COPD Mother   . Diabetes Mother   . Hypertension Father   . COPD Father   . Heart disease Father   . Arthritis Sister    Past Surgical History:  Procedure Laterality Date  . BACK SURGERY    . EYE SURGERY    . HERNIA REPAIR    . WISDOM TOOTH EXTRACTION      Short Social History:  Social History   Tobacco Use  . Smoking status: Former Smoker    Types: Cigarettes    Quit date: 06/25/2013    Years since quitting: 7.2  . Smokeless tobacco: Former Systems developer    Quit date: 06/25/1976  Substance Use Topics  . Alcohol use: Yes    Alcohol/week: 30.0 standard drinks    Types: 30 Cans of beer per week    Allergies  Allergen Reactions  . Amlodipine     Other reaction(s): Edema of lower extremity    Current Outpatient Medications  Medication Sig Dispense Refill  . albuterol (PROVENTIL HFA;VENTOLIN HFA) 108 (90 BASE) MCG/ACT inhaler Inhale 2 puffs into the lungs every 6 (six) hours as needed for wheezing or shortness of breath.    Marland Kitchen albuterol (PROVENTIL) (2.5  MG/3ML) 0.083% nebulizer solution Take 2.5 mg by nebulization every 6 (six) hours as needed for wheezing or shortness of breath.    Marland Kitchen amLODipine (NORVASC) 10 MG tablet Take 10 mg by mouth daily.    . benazepril (LOTENSIN) 10 MG tablet Take 10 mg by mouth daily. Take 1/2 daily    . cholecalciferol (VITAMIN D) 1000 UNITS tablet Take 1,000 Units by mouth 2 (two) times daily.    . cilostazol (PLETAL) 100 MG tablet TAKE ONE TABLET BY MOUTH TWICE A DAY *START WITH 50MG  (1/2 TABLET) TWICE A DAY FOR 1 WEEK THEN ADVANCE TO FULL DOSE IF NO SIDE EFFECTS*    . folic acid (FOLVITE) 1 MG tablet Take 1 tablet by mouth daily.    Marland Kitchen gabapentin (NEURONTIN) 300 MG capsule TAKE TWO CAPSULES BY MOUTH THREE TIMES A DAY FOR NERVE PAIN    . hydrochlorothiazide (HYDRODIURIL) 25 MG tablet TAKE ONE TABLET BY MOUTH DAILY FOR SWELLING    . hydrocortisone 2.5 % cream Apply topically 3 (three) times daily. rectally    . Magnesium Oxide 420 MG TABS Take 1 tablet by mouth daily.    . meloxicam (MOBIC) 15 MG tablet Take 15 mg by mouth daily.    Marland Kitchen  metoprolol (TOPROL-XL) 200 MG 24 hr tablet Take 200 mg by mouth daily.    . multivitamin-lutein (OCUVITE-LUTEIN) CAPS capsule Take 1 capsule by mouth daily.    . niacin 100 MG tablet Take 100 mg by mouth at bedtime.    . ondansetron (ZOFRAN) 8 MG tablet Take by mouth every 8 (eight) hours as needed for nausea or vomiting.    . pantoprazole (PROTONIX) 40 MG tablet Take 40 mg by mouth 2 (two) times daily.    . rosuvastatin (CRESTOR) 20 MG tablet TAKE ONE-HALF TABLET BY MOUTH ONCE A DAY FOR CHOLESTEROL    . thiamine 100 MG tablet Take 100 mg by mouth daily.    . tiotropium (SPIRIVA) 18 MCG inhalation capsule Place 18 mcg into inhaler and inhale daily.    . urea (CARMOL) 20 % cream Apply topically 3 (three) times daily.     No current facility-administered medications for this visit.    Review of Systems  Constitutional:  Constitutional negative. HENT: HENT negative.  Eyes: Eyes  negative.  Respiratory: Positive for shortness of breath and wheezing.  Cardiovascular: Positive for claudication.  GI: Gastrointestinal negative.  Musculoskeletal: Musculoskeletal negative.  Skin: Skin negative.  Neurological: Neurological negative. Hematologic: Hematologic/lymphatic negative.  Psychiatric: Psychiatric negative.        Objective:  Objective   Vitals:   09/18/20 1126  BP: (!) 167/92  Pulse: 66  Resp: 20  Temp: 98.2 F (36.8 C)  SpO2: 93%  Weight: 211 lb (95.7 kg)  Height: 5' 5.75" (1.67 m)   Body mass index is 34.32 kg/m.  Physical Exam HENT:     Head: Normocephalic.     Nose:     Comments: Wearing a mask Eyes:     Extraocular Movements: Extraocular movements intact.     Pupils: Pupils are equal, round, and reactive to light.  Neck:     Vascular: No carotid bruit.  Cardiovascular:     Rate and Rhythm: Normal rate.  Pulmonary:     Effort: Pulmonary effort is normal.     Breath sounds: Wheezing present.  Abdominal:     General: Abdomen is flat. There is distension.     Palpations: Abdomen is soft.  Musculoskeletal:        General: Normal range of motion.     Right lower leg: Edema present.     Left lower leg: No edema.  Skin:    General: Skin is warm and dry.     Capillary Refill: Capillary refill takes 2 to 3 seconds.  Neurological:     General: No focal deficit present.     Mental Status: He is alert.  Psychiatric:        Mood and Affect: Mood normal.        Behavior: Behavior normal.        Thought Content: Thought content normal.        Judgment: Judgment normal.     Data: ABI Findings:  +---------+------------------+-----+--------+--------+  Right  Rt Pressure (mmHg)IndexWaveformComment   +---------+------------------+-----+--------+--------+  Brachial 169                     +---------+------------------+-----+--------+--------+  PTA   143        0.84 biphasic       +---------+------------------+-----+--------+--------+  DP    115        0.67 biphasic      +---------+------------------+-----+--------+--------+  Great Toe99        0.58           +---------+------------------+-----+--------+--------+   +---------+------------------+-----+----------+-------+    Left   Lt Pressure (mmHg)IndexWaveform Comment  +---------+------------------+-----+----------+-------+  Brachial 171                      +---------+------------------+-----+----------+-------+  PTA   104        0.61 monophasic      +---------+------------------+-----+----------+-------+  DP    110        0.64 monophasic      +---------+------------------+-----+----------+-------+  Great Toe67        0.39            +---------+------------------+-----+----------+-------+   +-------+-----------+-----------+------------+------------+  ABI/TBIToday's ABIToday's TBIPrevious ABIPrevious TBI  +-------+-----------+-----------+------------+------------+  Right 0.84    0.58                  +-------+-----------+-----------+------------+------------+  Left  0.64    0.39                  +-------+-----------+-----------+------------+------------+       Assessment/Plan:    70 year old male with 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  He actually appears to have early ischemic changes despite a toe pressure of 67.  He is no longer a smoker.  He states that this claudication is extremely life limiting.  We will begin with angiography from a right common femoral approach.  I discussed with the patient that he will likely require Plavix if we intervene.  He does take aspirin and a statin as it is.  I discussed the expected outcomes with the patient including possible need for stenting, balloon angioplasty,  atherectomy.  He would be a poor candidate for open surgery certainly this is an option as well.  Patient demonstrates good understanding we will get him set up on a Monday in the near future peer      Waynetta Sandy MD [facility]

## 2020-09-18 NOTE — Progress Notes (Signed)
Patient ID: Sean [name], male   DOB: 11/18/1950, 70 y.o.   MRN: [id number]  Reason for Consult: New Patient (Initial Visit)   Referred by Center, [facility]  Subjective:     HPI:  Sean [name] is a 70 y.o. male former smoker with risk factors for vascular disease hyperlipidemia hypertension.  He also has severe COPD.  He is limited in his walking to the shortness of breath as well as cramping in his calf that occurs at the very short distance 50-100 feet.  It does resolve after resting for a few minutes.  He does not have any tissue loss or ulceration.  He has never had lower extremity intervention.  Denies any history of stroke, TIA or amaurosis.  Does not have any personal or family history of abdominal aortic aneurysm.  Past Medical History:  Diagnosis Date  . Arthritis   . COPD (chronic obstructive pulmonary disease) ([place])   . Hyperlipidemia   . Hypertension    Family History  Problem Relation Age of Onset  . COPD Mother   . Diabetes Mother   . Hypertension Father   . COPD Father   . Heart disease Father   . Arthritis Sister    Past Surgical History:  Procedure Laterality Date  . BACK SURGERY    . EYE SURGERY    . HERNIA REPAIR    . WISDOM TOOTH EXTRACTION      Short Social History:  Social History   Tobacco Use  . Smoking status: Former Smoker    Types: Cigarettes    Quit date: 06/25/2013    Years since quitting: 7.2  . Smokeless tobacco: Former Systems developer    Quit date: 06/25/1976  Substance Use Topics  . Alcohol use: Yes    Alcohol/week: 30.0 standard drinks    Types: 30 Cans of beer per week    Allergies  Allergen Reactions  . Amlodipine     Other reaction(s): Edema of lower extremity    Current Outpatient Medications  Medication Sig Dispense Refill  . albuterol (PROVENTIL HFA;VENTOLIN HFA) 108 (90 BASE) MCG/ACT inhaler Inhale 2 puffs into the lungs every 6 (six) hours as needed for wheezing or shortness of breath.    Marland Kitchen albuterol (PROVENTIL) (2.5  MG/3ML) 0.083% nebulizer solution Take 2.5 mg by nebulization every 6 (six) hours as needed for wheezing or shortness of breath.    Marland Kitchen amLODipine (NORVASC) 10 MG tablet Take 10 mg by mouth daily.    . benazepril (LOTENSIN) 10 MG tablet Take 10 mg by mouth daily. Take 1/2 daily    . cholecalciferol (VITAMIN D) 1000 UNITS tablet Take 1,000 Units by mouth 2 (two) times daily.    . cilostazol (PLETAL) 100 MG tablet TAKE ONE TABLET BY MOUTH TWICE A DAY *START WITH 50MG  (1/2 TABLET) TWICE A DAY FOR 1 WEEK THEN ADVANCE TO FULL DOSE IF NO SIDE EFFECTS*    . folic acid (FOLVITE) 1 MG tablet Take 1 tablet by mouth daily.    Marland Kitchen gabapentin (NEURONTIN) 300 MG capsule TAKE TWO CAPSULES BY MOUTH THREE TIMES A DAY FOR NERVE PAIN    . hydrochlorothiazide (HYDRODIURIL) 25 MG tablet TAKE ONE TABLET BY MOUTH DAILY FOR SWELLING    . hydrocortisone 2.5 % cream Apply topically 3 (three) times daily. rectally    . Magnesium Oxide 420 MG TABS Take 1 tablet by mouth daily.    . meloxicam (MOBIC) 15 MG tablet Take 15 mg by mouth daily.    Marland Kitchen  metoprolol (TOPROL-XL) 200 MG 24 hr tablet Take 200 mg by mouth daily.    . multivitamin-lutein (OCUVITE-LUTEIN) CAPS capsule Take 1 capsule by mouth daily.    . niacin 100 MG tablet Take 100 mg by mouth at bedtime.    . ondansetron (ZOFRAN) 8 MG tablet Take by mouth every 8 (eight) hours as needed for nausea or vomiting.    . pantoprazole (PROTONIX) 40 MG tablet Take 40 mg by mouth 2 (two) times daily.    . rosuvastatin (CRESTOR) 20 MG tablet TAKE ONE-HALF TABLET BY MOUTH ONCE A DAY FOR CHOLESTEROL    . thiamine 100 MG tablet Take 100 mg by mouth daily.    Marland Kitchen tiotropium (SPIRIVA) 18 MCG inhalation capsule Place 18 mcg into inhaler and inhale daily.    . urea (CARMOL) 20 % cream Apply topically 3 (three) times daily.     No current facility-administered medications for this visit.    Review of Systems  Constitutional:  Constitutional negative. HENT: HENT negative.  Eyes: Eyes  negative.  Respiratory: Positive for shortness of breath and wheezing.  Cardiovascular: Positive for claudication.  GI: Gastrointestinal negative.  Musculoskeletal: Musculoskeletal negative.  Skin: Skin negative.  Neurological: Neurological negative. Hematologic: Hematologic/lymphatic negative.  Psychiatric: Psychiatric negative.        Objective:  Objective   Vitals:   09/18/20 1126  BP: (!) 167/92  Pulse: 66  Resp: 20  Temp: 98.2 F (36.8 C)  SpO2: 93%  Weight: 211 lb (95.7 kg)  Height: 5' 5.75" (1.67 m)   Body mass index is 34.32 kg/m.  Physical Exam HENT:     Head: Normocephalic.     Nose:     Comments: Wearing a mask Eyes:     Extraocular Movements: Extraocular movements intact.     Pupils: Pupils are equal, round, and reactive to light.  Neck:     Vascular: No carotid bruit.  Cardiovascular:     Rate and Rhythm: Normal rate.  Pulmonary:     Effort: Pulmonary effort is normal.     Breath sounds: Wheezing present.  Abdominal:     General: Abdomen is flat. There is distension.     Palpations: Abdomen is soft.  Musculoskeletal:        General: Normal range of motion.     Right lower leg: Edema present.     Left lower leg: No edema.  Skin:    General: Skin is warm and dry.     Capillary Refill: Capillary refill takes 2 to 3 seconds.  Neurological:     General: No focal deficit present.     Mental Status: He is alert.  Psychiatric:        Mood and Affect: Mood normal.        Behavior: Behavior normal.        Thought Content: Thought content normal.        Judgment: Judgment normal.     Data: ABI Findings:  +---------+------------------+-----+--------+--------+  Right  Rt Pressure (mmHg)IndexWaveformComment   +---------+------------------+-----+--------+--------+  Brachial 169                     +---------+------------------+-----+--------+--------+  PTA   143        0.84 biphasic       +---------+------------------+-----+--------+--------+  DP    115        0.67 biphasic      +---------+------------------+-----+--------+--------+  Great Toe99        0.58           +---------+------------------+-----+--------+--------+   +---------+------------------+-----+----------+-------+  Left   Lt Pressure (mmHg)IndexWaveform Comment  +---------+------------------+-----+----------+-------+  Brachial 171                      +---------+------------------+-----+----------+-------+  PTA   104        0.61 monophasic      +---------+------------------+-----+----------+-------+  DP    110        0.64 monophasic      +---------+------------------+-----+----------+-------+  Great Toe67        0.39            +---------+------------------+-----+----------+-------+   +-------+-----------+-----------+------------+------------+  ABI/TBIToday's ABIToday's TBIPrevious ABIPrevious TBI  +-------+-----------+-----------+------------+------------+  Right 0.84    0.58                  +-------+-----------+-----------+------------+------------+  Left  0.64    0.39                  +-------+-----------+-----------+------------+------------+       Assessment/Plan:    70 year old male with 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  He actually appears to have early ischemic changes despite a toe pressure of 67.  He is no longer a smoker.  He states that this claudication is extremely life limiting.  We will begin with angiography from a right common femoral approach.  I discussed with the patient that he will likely require Plavix if we intervene.  He does take aspirin and a statin as it is.  I discussed the expected outcomes with the patient including possible need for stenting, balloon angioplasty,  atherectomy.  He would be a poor candidate for open surgery certainly this is an option as well.  Patient demonstrates good understanding we will get him set up on a Monday in the near future peer      Waynetta Sandy MD [facility]

## 2020-09-26 ENCOUNTER — Other Ambulatory Visit (HOSPITAL_COMMUNITY)
Admission: RE | Admit: 2020-09-26 | Discharge: 2020-09-26 | Disposition: A | Discharge status: Home / Self Care | Payer: Medicare PPO | Source: Ambulatory Visit | Attending: Vascular Surgery | Admitting: Vascular Surgery

## 2020-09-26 DIAGNOSIS — Z01812 Encounter for preprocedural laboratory examination: Secondary | ICD-10-CM | POA: Insufficient documentation

## 2020-09-26 DIAGNOSIS — Z20822 Contact with and (suspected) exposure to covid-19: Secondary | ICD-10-CM | POA: Diagnosis not present

## 2020-09-27 LAB — SARS CORONAVIRUS 2 (TAT 6-24 HRS): SARS Coronavirus 2: NEGATIVE

## 2020-09-28 ENCOUNTER — Encounter (HOSPITAL_COMMUNITY)
Admission: RE | Disposition: A | Discharge status: Home / Self Care | Payer: Self-pay | Source: Home / Self Care | Attending: Vascular Surgery

## 2020-09-28 ENCOUNTER — Encounter (HOSPITAL_COMMUNITY): Payer: Self-pay | Admitting: Vascular Surgery

## 2020-09-28 ENCOUNTER — Other Ambulatory Visit: Payer: Self-pay

## 2020-09-28 ENCOUNTER — Ambulatory Visit (HOSPITAL_COMMUNITY)
Admission: RE | Admit: 2020-09-28 | Discharge: 2020-09-28 | Disposition: A | Discharge status: Home / Self Care | Payer: No Typology Code available for payment source | Attending: Vascular Surgery | Admitting: Vascular Surgery

## 2020-09-28 DIAGNOSIS — J449 Chronic obstructive pulmonary disease, unspecified: Secondary | ICD-10-CM | POA: Insufficient documentation

## 2020-09-28 DIAGNOSIS — Z79899 Other long term (current) drug therapy: Secondary | ICD-10-CM | POA: Insufficient documentation

## 2020-09-28 DIAGNOSIS — Z87891 Personal history of nicotine dependence: Secondary | ICD-10-CM | POA: Insufficient documentation

## 2020-09-28 DIAGNOSIS — E785 Hyperlipidemia, unspecified: Secondary | ICD-10-CM | POA: Diagnosis not present

## 2020-09-28 DIAGNOSIS — I10 Essential (primary) hypertension: Secondary | ICD-10-CM | POA: Diagnosis not present

## 2020-09-28 DIAGNOSIS — I70212 Atherosclerosis of native arteries of extremities with intermittent claudication, left leg: Secondary | ICD-10-CM | POA: Insufficient documentation

## 2020-09-28 HISTORY — PX: ABDOMINAL AORTOGRAM W/LOWER EXTREMITY: CATH118223

## 2020-09-28 LAB — POCT I-STAT, CHEM 8
BUN: 16 mg/dL (ref 8–23)
Calcium, Ion: 1.13 mmol/L — ABNORMAL LOW (ref 1.15–1.40)
Chloride: 98 mmol/L (ref 98–111)
Creatinine, Ser: 0.7 mg/dL (ref 0.61–1.24)
Glucose, Bld: 104 mg/dL — ABNORMAL HIGH (ref 70–99)
HCT: 42 % (ref 39.0–52.0)
Hemoglobin: 14.3 g/dL (ref 13.0–17.0)
Potassium: 4.6 mmol/L (ref 3.5–5.1)
Sodium: 133 mmol/L — ABNORMAL LOW (ref 135–145)
TCO2: 28 mmol/L (ref 22–32)

## 2020-09-28 SURGERY — ABDOMINAL AORTOGRAM W/LOWER EXTREMITY
Anesthesia: LOCAL | Laterality: Bilateral

## 2020-09-28 MED ORDER — SODIUM CHLORIDE 0.9 % WEIGHT BASED INFUSION
1.0000 mL/kg/h | INTRAVENOUS | Status: DC
  Administered 2020-09-28: 1 mL/kg/h via INTRAVENOUS

## 2020-09-28 MED ORDER — ONDANSETRON HCL 4 MG/2ML IJ SOLN: 4.0000 mg | Freq: Four times a day (QID) | INTRAMUSCULAR | Status: DC | PRN

## 2020-09-28 MED ORDER — HEPARIN (PORCINE) IN NACL 1000-0.9 UT/500ML-% IV SOLN
INTRAVENOUS | Status: DC | PRN
  Administered 2020-09-28 (×2): 500 mL

## 2020-09-28 MED ORDER — ASPIRIN EC 81 MG PO TBEC
81.0000 mg | DELAYED_RELEASE_TABLET | Freq: Every day | ORAL | Status: DC
  Administered 2020-09-28: 81 mg via ORAL

## 2020-09-28 MED ORDER — SODIUM CHLORIDE 0.9% FLUSH: 3.0000 mL | Freq: Two times a day (BID) | INTRAVENOUS | Status: DC

## 2020-09-28 MED ORDER — FENTANYL CITRATE (PF) 100 MCG/2ML IJ SOLN
INTRAMUSCULAR | Status: DC | PRN
  Administered 2020-09-28: 25 ug via INTRAVENOUS

## 2020-09-28 MED ORDER — SODIUM CHLORIDE 0.9 % IV SOLN: 250.0000 mL | INTRAVENOUS | Status: DC | PRN

## 2020-09-28 MED ORDER — ACETAMINOPHEN 325 MG PO TABS: 650.0000 mg | ORAL_TABLET | ORAL | Status: DC | PRN

## 2020-09-28 MED ORDER — HYDRALAZINE HCL 20 MG/ML IJ SOLN
INTRAMUSCULAR | Status: AC
  Filled 2020-09-28: qty 1

## 2020-09-28 MED ORDER — MORPHINE SULFATE (PF) 2 MG/ML IV SOLN: 2.0000 mg | INTRAVENOUS | Status: DC | PRN

## 2020-09-28 MED ORDER — ASPIRIN 81 MG PO CHEW
CHEWABLE_TABLET | ORAL | Status: AC
  Filled 2020-09-28: qty 1

## 2020-09-28 MED ORDER — SODIUM CHLORIDE 0.9% FLUSH: 3.0000 mL | INTRAVENOUS | Status: DC | PRN

## 2020-09-28 MED ORDER — LIDOCAINE HCL (PF) 1 % IJ SOLN
INTRAMUSCULAR | Status: DC | PRN
  Administered 2020-09-28: 15 mL via INTRADERMAL

## 2020-09-28 MED ORDER — MIDAZOLAM HCL 2 MG/2ML IJ SOLN
INTRAMUSCULAR | Status: AC
  Filled 2020-09-28: qty 2

## 2020-09-28 MED ORDER — MIDAZOLAM HCL 2 MG/2ML IJ SOLN
INTRAMUSCULAR | Status: DC | PRN
  Administered 2020-09-28: 1 mg via INTRAVENOUS

## 2020-09-28 MED ORDER — OXYCODONE HCL 5 MG PO TABS: 5.0000 mg | ORAL_TABLET | ORAL | Status: DC | PRN

## 2020-09-28 MED ORDER — IODIXANOL 320 MG/ML IV SOLN
INTRAVENOUS | Status: DC | PRN
  Administered 2020-09-28: 140 mL via INTRA_ARTERIAL

## 2020-09-28 MED ORDER — SODIUM CHLORIDE 0.9 % IV SOLN: INTRAVENOUS | Status: DC

## 2020-09-28 MED ORDER — HYDRALAZINE HCL 20 MG/ML IJ SOLN
5.0000 mg | INTRAMUSCULAR | Status: DC | PRN
  Administered 2020-09-28: 5 mg via INTRAVENOUS

## 2020-09-28 MED ORDER — LIDOCAINE HCL (PF) 1 % IJ SOLN
INTRAMUSCULAR | Status: AC
  Filled 2020-09-28: qty 30

## 2020-09-28 MED ORDER — LABETALOL HCL 5 MG/ML IV SOLN
10.0000 mg | INTRAVENOUS | Status: DC | PRN
Start: 2020-09-28 — End: 2020-09-28
  Administered 2020-09-28: 10 mg via INTRAVENOUS

## 2020-09-28 MED ORDER — HEPARIN (PORCINE) IN NACL 1000-0.9 UT/500ML-% IV SOLN
INTRAVENOUS | Status: AC
  Filled 2020-09-28: qty 1000

## 2020-09-28 MED ORDER — LABETALOL HCL 5 MG/ML IV SOLN
INTRAVENOUS | Status: AC
  Filled 2020-09-28: qty 4

## 2020-09-28 MED ORDER — FENTANYL CITRATE (PF) 100 MCG/2ML IJ SOLN
INTRAMUSCULAR | Status: AC
  Filled 2020-09-28: qty 2

## 2020-09-28 SURGICAL SUPPLY — 11 items
CATH ANGIO 5F BER2 65CM (CATHETERS) ×2 IMPLANT
CATH OMNI FLUSH 5F 65CM (CATHETERS) ×2 IMPLANT
GLIDEWIRE ADV .035X260CM (WIRE) ×2 IMPLANT
KIT MICROPUNCTURE NIT STIFF (SHEATH) ×2 IMPLANT
KIT PV (KITS) ×2 IMPLANT
SHEATH PINNACLE 5F 10CM (SHEATH) ×2 IMPLANT
SHEATH PROBE COVER 6X72 (BAG) ×2 IMPLANT
SYR MEDRAD MARK V 150ML (SYRINGE) ×2 IMPLANT
TRANSDUCER W/STOPCOCK (MISCELLANEOUS) ×2 IMPLANT
TRAY PV CATH (CUSTOM PROCEDURE TRAY) ×2 IMPLANT
WIRE STARTER BENTSON 035X150 (WIRE) ×2 IMPLANT

## 2020-09-28 NOTE — Interval H&P Note (Signed)
History and Physical Interval Note:  09/28/2020 8:24 AM  Sean [name]  has presented today for surgery, with the diagnosis of peripheral vascular disease.  The various methods of treatment have been discussed with the patient and family. After consideration of risks, benefits and other options for treatment, the patient has consented to  Procedure(s): ABDOMINAL AORTOGRAM W/LOWER EXTREMITY (N/A) as a surgical intervention.  The patient's history has been reviewed, patient examined, no change in status, stable for surgery.  I have reviewed the patient's chart and labs.  Questions were answered to the patient's satisfaction.     Servando Snare

## 2020-09-28 NOTE — Progress Notes (Signed)
PIV noted to right hand, 22g, CDI, NS infusing, see MAR

## 2020-09-28 NOTE — Progress Notes (Signed)
SITE AREA:  Right Femoral  SITE PRIOR TO REMOVAL:  LEVEL 0  PRESSURE APPLIED FOR: approximately 20 minutes  MANUAL: Yes  PATIENT STATUS DURING PULL: WNL  POST PULL SITE:  LEVEL 0, puffiness noted, but remains soft  POST PULL INSTRUCTIONS GIVEN: YES  POST PULL PULSES PRESENT:  Right pedal pulse +2, left pedal pulse dopplerable  DRESSING APPLIED: Gauze with tegaderm  BEDREST BEGINS @ 1143  COMMENTS:

## 2020-09-28 NOTE — Discharge Instructions (Signed)
DRINK PLENTY OF FLUIDS OVER THE NEXT 2-3 DAYS. Femoral Site Care  This sheet gives you information about how to care for yourself after your procedure. Your health care provider may also give you more specific instructions. If you have problems or questions, contact your health care provider. What can I expect after the procedure? After the procedure, it is common to have:  Bruising that usually fades within 1-2 weeks.  Tenderness at the site. Follow these instructions at home: Wound care  Follow instructions from your health care provider about how to take care of your insertion site. Make sure you: ? Wash your hands with soap and water before you change your bandage (dressing). If soap and water are not available, use hand sanitizer. ? Change your dressing as told by your health care provider. ? Leave stitches (sutures), skin glue, or adhesive strips in place. These skin closures may need to stay in place for 2 weeks or longer. If adhesive strip edges start to loosen and curl up, you may trim the loose edges. Do not remove adhesive strips completely unless your health care provider tells you to do that.  Do not take baths, swim, or use a hot tub until your health care provider approves.  You may shower 24-48 hours after the procedure or as told by your health care provider. ? Gently wash the site with plain soap and water. ? Pat the area dry with a clean towel. ? Do not rub the site. This may cause bleeding.  Do not apply powder or lotion to the site. Keep the site clean and dry.  Check your femoral site every day for signs of infection. Check for: ? Redness, swelling, or pain. ? Fluid or blood. ? Warmth. ? Pus or a bad smell. Activity  For the first 2-3 days after your procedure, or as long as directed: ? Avoid climbing stairs as much as possible. ? Do not squat.  Do not lift anything that is heavier than 10 lb (4.5 kg), or the limit that you are told, until your health  care provider says that it is safe.  Rest as directed. ? Avoid sitting for a long time without moving. Get up to take short walks every 1-2 hours.  Do not drive for 24 hours if you were given a medicine to help you relax (sedative). General instructions  Take over-the-counter and prescription medicines only as told by your health care provider.  Keep all follow-up visits as told by your health care provider. This is important. Contact a health care provider if you have:  A fever or chills.  You have redness, swelling, or pain around your insertion site. Get help right away if:  The catheter insertion area swells very fast.  You pass out.  You suddenly start to sweat or your skin gets clammy.  The catheter insertion area is bleeding, and the bleeding does not stop when you hold steady pressure on the area.  The area near or just beyond the catheter insertion site becomes pale, cool, tingly, or numb. These symptoms may represent a serious problem that is an emergency. Do not wait to see if the symptoms will go away. Get medical help right away. Call your local emergency services (911 in the [place] Do not drive yourself to the hospital. Summary  After the procedure, it is common to have bruising that usually fades within 1-2 weeks.  Check your femoral site every day for signs of infection.  Do not lift anything   anything that is heavier than 10 lb (4.5 kg), or the limit that you are told, until your health care provider says that it is safe. This information is not intended to replace advice given to you by your health care provider. Make sure you discuss any questions you have with your health care provider. Document Revised: 04/03/2020 Document Reviewed: 04/03/2020 Elsevier Patient Education  2021 Elsevier Inc.  

## 2020-09-28 NOTE — Op Note (Signed)
    Patient name: Dayven Linsley MRN: [id number] DOB: 1950/12/24 Sex: male  09/28/2020 Pre-operative Diagnosis: Severe life limiting left lower extremity claudication Post-operative diagnosis:  Same Surgeon:  Eda Paschal. Donzetta Matters, MD Procedure Performed: 1.  Ultrasound-guided cannulation right common femoral artery 2.  Aortogram with bilateral lower extremity runoff 3.  Moderate sedation with fentanyl and Versed for 26 minutes  Indications: 70 year old male who is a veteran is [employer] presented for evaluation of severe life limiting lower extremity claudication with early ischemic changes of his feet.  He is indicated for angiography with possible invention focusing on the left side.  Findings: The right common iliac artery was heavily calcified required a Glidewire to get through.  The aorta is heavily calcified although patent.  Right common external iliac arteries are patent.  There is severe disease of the right common femoral artery.  On the right lower extremity the SFA is patent as are the popliteal arteries with three-vessel runoff.  On the left side there is a takeoff of the common iliac artery and then occludes there is a collateral filling of the hypogastric however the takeoff of the hypogastric is occluded and he is occluded in his external leg artery as well all the way to the common femoral artery which also appears subtotally occluded and there is no common femoral pulse on the left.  He does have an SFA runoff to the adductor there appears disease and there is at least runoff via the posterior tibial artery on the left.  Plan will be to perform left common femoral endarterectomy with retrograde left common and external iliac artery stenting and possible right to left femorofemoral bypass.   Procedure:  The patient was identified in the holding area and taken to room 8.  The patient was then placed supine on the table and prepped and draped in the usual sterile fashion.  A time  out was called.  Ultrasound was used to evaluate the right common femoral artery.  There was severe disease.  We found one area with soft anterior wall.  His groin was anesthetized 1% lidocaine we cannulated the anterior wall of the common femoral artery with micropuncture needle followed by wire sheath.  And images saved the permanent record.  I did have trouble passing the micropuncture wire I was able to place a Bentson wire placed a 5 [place] sheath.  I performed retrograde angiography which demonstrated common iliac artery and external leg artery on the right were in fact patent.  I used a Glidewire advantage and placed this into the aorta I placed a very catheter confirmed intraluminal access placed an Omni catheter over a Glidewire vantage performed aortogram followed by lower extremity runoff.  With the above findings we will plan for surgical fixation of his left common femoral artery with retrograde stent versus right to left femorofemoral bypass.  Catheter and wire were removed.  Sheath will be pulled in postoperative holding.  He tolerated procedure without any complication   Contrast: 140cc  Cassian Torelli C. Donzetta Matters, MD [facility] [place] Office: [phone] Pager: [phone]

## 2020-09-30 ENCOUNTER — Other Ambulatory Visit: Payer: Self-pay

## 2020-09-30 ENCOUNTER — Telehealth: Payer: Self-pay | Admitting: *Deleted

## 2020-09-30 NOTE — Telephone Encounter (Signed)
   [facility] Pre-operative Risk Assessment    HEARTCARE STAFF: - Please ensure there is not already an duplicate clearance open for this procedure. - Under Visit Info/Reason for Call, type in Other and utilize the format Clearance MM/DD/YY or Clearance TBD. Do not use dashes or single digits. - If request is for dental extraction, please clarify the # of teeth to be extracted.  Request for surgical clearance: PT WILL NEED A NEW PT APPT. MESSAGE SENT TO SCHEDULING TO SCHEDULE NEW PT APPT. WILL HAND OVER  NOTES TO CHART PREP (ROSE J. CMA)  1. What type of surgery is being performed? LEFT FEMORAL ENDARTERECTOMY ILIAC STENTS ON LEFT FEMORAL TO FEMORAL BYPASS   2. When is this surgery scheduled? 11/04/20   3. What type of clearance is required (medical clearance vs. Pharmacy clearance to hold med vs. Both)? MEDICAL  4. Are there any medications that need to be held prior to surgery and how long? NONE LISTED    5. Practice name and name of physician performing surgery? [facility]; DR. Erlene Quan CAIN   6. What is the office phone number? [phone]   7.   What is the office fax number? [phone]  8.   Anesthesia type (None, local, MAC, general) ? NOT LISTED   Julaine Hua 09/30/2020, 4:44 PM  _________________________________________________________________   (provider comments below)

## 2020-10-04 NOTE — Progress Notes (Signed)
Cardiology Office Note:    Date:  10/05/2020   ID:  Sean [name], DOB 03/28/1951, MRN [id number]  PCP:  [facility] [place]  Cardiologist:  No primary care provider on file.  Advanced Practice Provider:  No care team member to display Electrophysiologist:  None    Referring MD: Bobette Mo*     History of Present Illness:    Sean [name] is a 70 y.o. male with a hx of HTN, HLD, severe COPD and peripheral vascular disease with LE claudication who was referred by Dr. Donzetta Matters for pre-operative evaluation prior to left common femoral endarterectomy with retrograde left common and external iliac artery stenting and possible right to left femoral bypass.  Patient states that he is able to walk about 200 feet before he develops claudication in the left leg and shortness of breath. No chest pain with exertion, lightheadedness or dizziness. Has some right lower extremity swelling that is chronic. Not on oxygen at home. No orthopnea or PND. No palpitations. No known personal history of heart disease. Former smoker 2-3packs/day; quit 8 years ago; smoked 40 years. Has not had prior stress testing or echo.  Family History: Mother-HTN, Father-CAD s/p bypass, HTN; brother with arrhythmias.   Past Medical History:  Diagnosis Date  . Arthritis   . COPD (chronic obstructive pulmonary disease) ([place])   . Hyperlipidemia   . Hypertension     Past Surgical History:  Procedure Laterality Date  . ABDOMINAL AORTOGRAM W/LOWER EXTREMITY Bilateral 09/28/2020   Procedure: ABDOMINAL AORTOGRAM W/LOWER EXTREMITY;  Surgeon: Waynetta Sandy, MD;  Location: [facility];  Service: Cardiovascular;  Laterality: Bilateral;  . BACK SURGERY    . EYE SURGERY    . HERNIA REPAIR    . WISDOM TOOTH EXTRACTION      Current Medications: Current Meds  Medication Sig  . albuterol (PROVENTIL HFA;VENTOLIN HFA) 108 (90 BASE) MCG/ACT inhaler Inhale 2 puffs into  the lungs every 6 (six) hours as needed for wheezing or shortness of breath.  Marland Kitchen albuterol (PROVENTIL) (2.5 MG/3ML) 0.083% nebulizer solution Take 2.5 mg by nebulization every 6 (six) hours as needed for wheezing or shortness of breath.  . benazepril (LOTENSIN) 10 MG tablet Take 1 tablet (10 mg total) by mouth at bedtime.  . cholecalciferol (VITAMIN D) 1000 UNITS tablet Take 1,000 Units by mouth 2 (two) times daily.  . cilostazol (PLETAL) 100 MG tablet Take 100 mg by mouth 2 (two) times daily.  . Fluticasone-Salmeterol (ADVAIR) 250-50 MCG/DOSE AEPB Inhale 1 puff into the lungs 2 (two) times daily.  . folic acid (FOLVITE) 1 MG tablet Take 1 mg by mouth daily.  . hydrochlorothiazide (HYDRODIURIL) 25 MG tablet Take 25 mg by mouth daily.  . hydrocortisone 2.5 % cream Apply 1 application topically 3 (three) times daily as needed (itching). rectally  . LACTOBACILLUS PO Take 2 capsules by mouth daily.  . Magnesium Oxide 420 MG TABS Take 420 mg by mouth daily.  . meloxicam (MOBIC) 15 MG tablet Take 15 mg by mouth daily.  . multivitamin-lutein (OCUVITE-LUTEIN) CAPS capsule Take 1 capsule by mouth daily.  . niacin 100 MG tablet Take 100 mg by mouth at bedtime.  . Omega-3 Fatty Acids (FISH OIL) 1000 MG CAPS Take 1,000 mg by mouth daily.  . pantoprazole (PROTONIX) 40 MG tablet Take 40 mg by mouth 2 (two) times daily.  . rosuvastatin (CRESTOR) 20 MG tablet Take 10 mg by mouth daily.  Marland Kitchen tiotropium (  SPIRIVA) 18 MCG inhalation capsule Place 18 mcg into inhaler and inhale daily.  . [DISCONTINUED] benazepril (LOTENSIN) 10 MG tablet Take 5 mg by mouth daily.     Allergies:   Amlodipine   Social History   Socioeconomic History  . Marital status: Single    Spouse name: Not on file  . Number of children: Not on file  . Years of education: Not on file  . Highest education level: Not on file  Occupational History  . Not on file  Tobacco Use  . Smoking status: Former Smoker    Types: Cigarettes    Quit  date: 06/25/2013    Years since quitting: 7.2  . Smokeless tobacco: Former Systems developer    Quit date: 06/25/1976  Vaping Use  . Vaping Use: Never used  Substance and Sexual Activity  . Alcohol use: Yes    Alcohol/week: 30.0 standard drinks    Types: 30 Cans of beer per week  . Drug use: No  . Sexual activity: Not on file  Other Topics Concern  . Not on file  Social History Narrative  . Not on file   Social Determinants of Health   Financial Resource Strain: Not on file  Food Insecurity: Not on file  Transportation Needs: Not on file  Physical Activity: Not on file  Stress: Not on file  Social Connections: Not on file     Family History: The patient's family history includes Arthritis in his sister; COPD in his father and mother; Diabetes in his mother; Heart disease in his father; Hypertension in his father.  ROS:   Please see the history of present illness.    Review of Systems  Constitutional: Positive for malaise/fatigue. Negative for chills and fever.  HENT: Positive for hearing loss. Negative for nosebleeds.   Eyes: Negative for blurred vision.  Respiratory: Positive for shortness of breath.   Cardiovascular: Positive for claudication and leg swelling. Negative for chest pain, palpitations, orthopnea and PND.  Gastrointestinal: Negative for nausea and vomiting.  Genitourinary: Negative for hematuria.  Musculoskeletal: Positive for joint pain. Negative for falls.  Neurological: Negative for dizziness and loss of consciousness.  Endo/Heme/Allergies: Negative for polydipsia.  Psychiatric/Behavioral: Negative for substance abuse.    EKGs/Labs/Other Studies Reviewed:    The following studies were reviewed today: 09/28/20 Aortogram with bilateral run-off: Findings: The right common iliac artery was heavily calcified required a Glidewire to get through.  The aorta is heavily calcified although patent.  Right common external iliac arteries are patent.  There is severe disease  of the right common femoral artery.  On the right lower extremity the SFA is patent as are the popliteal arteries with three-vessel runoff.  On the left side there is a takeoff of the common iliac artery and then occludes there is a collateral filling of the hypogastric however the takeoff of the hypogastric is occluded and he is occluded in his external leg artery as well all the way to the common femoral artery which also appears subtotally occluded and there is no common femoral pulse on the left.  He does have an SFA runoff to the adductor there appears disease and there is at least runoff via the posterior tibial artery on the left.  Plan will be to perform left common femoral endarterectomy with retrograde left common and external iliac artery stenting and possible right to left femorofemoral bypass.  ABI: Summary:  Right: Resting right ankle-brachial index indicates mild right lower  extremity arterial disease. The right toe-brachial  index is abnormal. RT  great toe pressure = 99 mmHg.   Left: Resting left ankle-brachial index indicates moderate left lower  extremity arterial disease. The left toe-brachial index is abnormal. LT  Great toe pressure = 67 mmHg.   EKG:  EKG is  ordered today.  The ekg ordered today demonstrates NSR, late r-wave progression, HR 88  Recent Labs: 09/28/2020: BUN 16; Creatinine, Ser 0.70; Hemoglobin 14.3; Potassium 4.6; Sodium 133  Recent Lipid Panel No results found for: CHOL, TRIG, HDL, CHOLHDL, VLDL, LDLCALC, LDLDIRECT    Physical Exam:    VS:  BP (!) 142/80   Pulse 88   Ht 5\' 6"  (1.676 m)   Wt 213 lb 6.4 oz (96.8 kg)   SpO2 93%   BMI 34.44 kg/m     Wt Readings from Last 3 Encounters:  10/05/20 213 lb 6.4 oz (96.8 kg)  09/28/20 209 lb (94.8 kg)  09/18/20 211 lb (95.7 kg)     GEN:  Well nourished, well developed in no acute distress HEENT: Normal NECK: No JVD; No carotid bruits CARDIAC: RRR, 1/6 systolic murmur. No rubs,  gallops RESPIRATORY:  Diminished throughout. No wheezing. ABDOMEN: Soft, non-tender, non-distended MUSCULOSKELETAL:  Trace RLE edema (chronic); no significant LLE edema SKIN: Warm and dry NEUROLOGIC:  Alert and oriented x 3 PSYCHIATRIC:  Normal affect   ASSESSMENT:    1. Preoperative clearance   2. Dyspnea on exertion   3. Heart murmur   4. Peripheral vascular disease, unspecified ([place])   5. Claudication, intermittent ([place])   6. Mixed hyperlipidemia   7. Systolic murmur    PLAN:    In order of problems listed above:  #Pre-operative evaluation prior to left common femoral endarterectomy with retrograde left common and external iliac artery stenting and possible right to left femoral bypass: #Peripheral Vascular Disease: Patient functionally limited due to claudication in LLE and SOB due to underlying COPD. Cannot perform >4 METs. No exertional chest pain, lightheadedness, palpitaitons or HF symptoms. However, given limited activity levels and risk factors, will proceed with stress testing. -Plan for lexiscan--if low or intermediate risk, no further testing needed prior to peripheral intervention -Continue ASA 81mg  daily -Continue crestor 20mg  daily  #Systolic Murmur: No HF symptoms. No prior TTE in our system. -Check TTE  #HTN: Elevated today. Has been running high at home -Increase benzapril to 10mg  daily to take at night -Continue HCTZ 25mg  daily to take in the AM -Repeat BMET in 1 week  #HLD: -Continue crestor 20mg  daily  #Severe COPD: -Continue home inhalers -Management per Pulm  Shared Decision Making/Informed Consent The risks [chest pain, shortness of breath, cardiac arrhythmias, dizziness, blood pressure fluctuations, myocardial infarction, stroke/transient ischemic attack, nausea, vomiting, allergic reaction, radiation exposure, metallic taste sensation and life-threatening complications (estimated to be 1 in 10,000)], benefits (risk stratification, diagnosing  coronary artery disease, treatment guidance) and alternatives of a nuclear stress test were discussed in detail with Sean [name] and he agrees to proceed.   Medication Adjustments/Labs and Tests Ordered: Current medicines are reviewed at length with the patient today.  Concerns regarding medicines are outlined above.  Orders Placed This Encounter  Procedures  . Basic metabolic panel  . MYOCARDIAL PERFUSION IMAGING  . EKG 12-Lead  . ECHOCARDIOGRAM COMPLETE   Meds ordered this encounter  Medications  . benazepril (LOTENSIN) 10 MG tablet    Sig: Take 1 tablet (10 mg total) by mouth at bedtime.    Dispense:  90 tablet    Refill:  3  Patient Instructions  Medication Instructions:  Your physician has recommended you make the following change in your medication:  1.  INCREASE the Benazepril to 10 mg taking it at bedtime 2. CHANGE the Hydrochlorothiazide to taking in the morning   *If you need a refill on your cardiac medications before your next appointment, please call your pharmacy*   Lab Work: 10/13/20:  Come to the office anytime after 7:30 on this day for:  BMET  If you have labs (blood work) drawn today and your tests are completely normal, you will receive your results only by: Marland Kitchen MyChart Message (if you have MyChart) OR . A paper copy in the mail If you have any lab test that is abnormal or we need to change your treatment, we will call you to review the results.   Testing/Procedures:  Your physician has requested that you have an echocardiogram. Echocardiography is a painless test that uses sound waves to create images of your heart. It provides your doctor with information about the size and shape of your heart and how well your heart's chambers and valves are working. This procedure takes approximately one hour. There are no restrictions for this procedure.    Your physician has requested that you have a lexiscan myoview. For further information please visit  [email or web address]. Please follow instruction sheet, BELOW:    You are scheduled for a Myocardial Perfusion Imaging Study Please arrive 15 minutes prior to your appointment time for registration and insurance purposes.  The test will take approximately 3 to 4 hours to complete; you may bring reading material.  If someone comes with you to your appointment, they will need to remain in the main lobby due to limited space in the testing area. **If you are pregnant or breastfeeding, please notify the nuclear lab prior to your appointment**  How to prepare for your Myocardial Perfusion Test: . Do not eat or drink 3 hours prior to your test, except you may have water. . Do not consume products containing caffeine (regular or decaffeinated) 12 hours prior to your test. (ex: coffee, chocolate, sodas, tea). . Do bring a list of your current medications with you.  If not listed below, you may take your medications as normal. . Do wear comfortable clothes (no dresses or overalls) and walking shoes, tennis shoes preferred (No heels or open toe shoes are allowed). . Do NOT wear cologne, perfume, aftershave, or lotions (deodorant is allowed). . If these instructions are not followed, your test will have to be rescheduled.    Follow-Up: At [facility], you and your health needs are our priority.  As part of our continuing mission to provide you with exceptional heart care, we have created designated Provider Care Teams.  These Care Teams include your primary Cardiologist (physician) and Advanced Practice Providers (APPs -  Physician Assistants and Nurse Practitioners) who all work together to provide you with the care you need, when you need it.  We recommend signing up for the patient portal called "MyChart".  Sign up information is provided on this After Visit Summary.  MyChart is used to connect with patients for Virtual Visits (Telemedicine).  Patients are able to view lab/test results, encounter  notes, upcoming appointments, etc.  Non-urgent messages can be sent to your provider as well.   To learn more about what you can do with MyChart, go to [email or web address].    Your next appointment:   3 month(s)  The format for your next appointment:   In Person  Provider:   Gwyndolyn Kaufman, MD   Other Instructions      Signed, Freada Bergeron, MD  10/05/2020 2:36 PM    [place]

## 2020-10-05 ENCOUNTER — Other Ambulatory Visit: Payer: Self-pay

## 2020-10-05 ENCOUNTER — Ambulatory Visit (INDEPENDENT_AMBULATORY_CARE_PROVIDER_SITE_OTHER): Payer: No Typology Code available for payment source | Admitting: Cardiology

## 2020-10-05 ENCOUNTER — Encounter: Payer: Self-pay | Admitting: Cardiology

## 2020-10-05 VITALS — BP 142/80 | HR 88 | Ht 66.0 in | Wt 213.4 lb

## 2020-10-05 DIAGNOSIS — R011 Cardiac murmur, unspecified: Secondary | ICD-10-CM

## 2020-10-05 DIAGNOSIS — R0600 Dyspnea, unspecified: Secondary | ICD-10-CM

## 2020-10-05 DIAGNOSIS — R0609 Other forms of dyspnea: Secondary | ICD-10-CM

## 2020-10-05 DIAGNOSIS — Z01818 Encounter for other preprocedural examination: Secondary | ICD-10-CM | POA: Diagnosis not present

## 2020-10-05 DIAGNOSIS — I739 Peripheral vascular disease, unspecified: Secondary | ICD-10-CM

## 2020-10-05 DIAGNOSIS — E782 Mixed hyperlipidemia: Secondary | ICD-10-CM

## 2020-10-05 MED ORDER — BENAZEPRIL HCL 10 MG PO TABS: 10.0000 mg | ORAL_TABLET | Freq: Every day | ORAL | 3 refills | Status: AC

## 2020-10-05 NOTE — Patient Instructions (Addendum)
Medication Instructions:  Your physician has recommended you make the following change in your medication:  1.  INCREASE the Benazepril to 10 mg taking it at bedtime 2. CHANGE the Hydrochlorothiazide to taking in the morning   *If you need a refill on your cardiac medications before your next appointment, please call your pharmacy*   Lab Work: 10/13/20:  Come to the office anytime after 7:30 on this day for:  BMET  If you have labs (blood work) drawn today and your tests are completely normal, you will receive your results only by: Marland Kitchen MyChart Message (if you have MyChart) OR . A paper copy in the mail If you have any lab test that is abnormal or we need to change your treatment, we will call you to review the results.   Testing/Procedures:  Your physician has requested that you have an echocardiogram. Echocardiography is a painless test that uses sound waves to create images of your heart. It provides your doctor with information about the size and shape of your heart and how well your heart's chambers and valves are working. This procedure takes approximately one hour. There are no restrictions for this procedure.    Your physician has requested that you have a lexiscan myoview. For further information please visit [email or web address]. Please follow instruction sheet, BELOW:    You are scheduled for a Myocardial Perfusion Imaging Study Please arrive 15 minutes prior to your appointment time for registration and insurance purposes.  The test will take approximately 3 to 4 hours to complete; you may bring reading material.  If someone comes with you to your appointment, they will need to remain in the main lobby due to limited space in the testing area. **If you are pregnant or breastfeeding, please notify the nuclear lab prior to your appointment**  How to prepare for your Myocardial Perfusion Test: . Do not eat or drink 3 hours prior to your test, except you may have water. . Do not  consume products containing caffeine (regular or decaffeinated) 12 hours prior to your test. (ex: coffee, chocolate, sodas, tea). . Do bring a list of your current medications with you.  If not listed below, you may take your medications as normal. . Do wear comfortable clothes (no dresses or overalls) and walking shoes, tennis shoes preferred (No heels or open toe shoes are allowed). . Do NOT wear cologne, perfume, aftershave, or lotions (deodorant is allowed). . If these instructions are not followed, your test will have to be rescheduled.    Follow-Up: At [facility], you and your health needs are our priority.  As part of our continuing mission to provide you with exceptional heart care, we have created designated Provider Care Teams.  These Care Teams include your primary Cardiologist (physician) and Advanced Practice Providers (APPs -  Physician Assistants and Nurse Practitioners) who all work together to provide you with the care you need, when you need it.  We recommend signing up for the patient portal called "MyChart".  Sign up information is provided on this After Visit Summary.  MyChart is used to connect with patients for Virtual Visits (Telemedicine).  Patients are able to view lab/test results, encounter notes, upcoming appointments, etc.  Non-urgent messages can be sent to your provider as well.   To learn more about what you can do with MyChart, go to [email or web address].    Your next appointment:   3 month(s)  The format for your next appointment:   In Person  Provider:  Gwyndolyn Kaufman, MD   Other Instructions

## 2020-10-05 NOTE — Addendum Note (Signed)
Addended by: Gwyndolyn Kaufman on: 10/05/2020 03:53 PM   Modules accepted: Orders

## 2020-10-13 ENCOUNTER — Other Ambulatory Visit: Payer: Self-pay

## 2020-10-13 ENCOUNTER — Other Ambulatory Visit: Payer: Medicare PPO

## 2020-10-13 DIAGNOSIS — R0609 Other forms of dyspnea: Secondary | ICD-10-CM

## 2020-10-13 DIAGNOSIS — R0600 Dyspnea, unspecified: Secondary | ICD-10-CM

## 2020-10-13 LAB — BASIC METABOLIC PANEL
BUN/Creatinine Ratio: 14 (ref 10–24)
BUN: 11 mg/dL (ref 8–27)
CO2: 23 mmol/L (ref 20–29)
Calcium: 9.4 mg/dL (ref 8.6–10.2)
Chloride: 95 mmol/L — ABNORMAL LOW (ref 96–106)
Creatinine, Ser: 0.81 mg/dL (ref 0.76–1.27)
Glucose: 94 mg/dL (ref 65–99)
Potassium: 4.3 mmol/L (ref 3.5–5.2)
Sodium: 134 mmol/L (ref 134–144)
eGFR: 97 mL/min/{1.73_m2} (ref >59)

## 2020-10-22 ENCOUNTER — Telehealth (HOSPITAL_COMMUNITY): Payer: Self-pay | Admitting: *Deleted

## 2020-10-22 NOTE — Telephone Encounter (Signed)
Patient given detailed instructions per Myocardial Perfusion Study Information Sheet for the test on 10/27/20. Patient notified to arrive 15 minutes early and that it is imperative to arrive on time for appointment to keep from having the test rescheduled.  If you need to cancel or reschedule your appointment, please call the office within 24 hours of your appointment. . Patient verbalized understanding. Kirstie Peri

## 2020-10-26 ENCOUNTER — Telehealth (HOSPITAL_COMMUNITY): Payer: Self-pay

## 2020-10-26 NOTE — Telephone Encounter (Signed)
Spoke with the patient, detailed instructions given. He stated that he would be here for his test. Asked to call back with any questions. S.Williams EMTP 

## 2020-10-27 ENCOUNTER — Other Ambulatory Visit: Payer: Self-pay

## 2020-10-27 ENCOUNTER — Ambulatory Visit (HOSPITAL_BASED_OUTPATIENT_CLINIC_OR_DEPARTMENT_OTHER): Payer: No Typology Code available for payment source

## 2020-10-27 ENCOUNTER — Ambulatory Visit (HOSPITAL_COMMUNITY): Payer: No Typology Code available for payment source | Attending: Cardiovascular Disease

## 2020-10-27 DIAGNOSIS — R011 Cardiac murmur, unspecified: Secondary | ICD-10-CM | POA: Diagnosis not present

## 2020-10-27 DIAGNOSIS — R0600 Dyspnea, unspecified: Secondary | ICD-10-CM | POA: Diagnosis not present

## 2020-10-27 DIAGNOSIS — R0609 Other forms of dyspnea: Secondary | ICD-10-CM

## 2020-10-27 LAB — MYOCARDIAL PERFUSION IMAGING
LV dias vol: 78 mL (ref 62–150)
LV sys vol: 31 mL
Peak HR: 109 {beats}/min
Rest HR: 93 {beats}/min
SDS: 2
SRS: 0
SSS: 2
TID: 1.02

## 2020-10-27 LAB — ECHOCARDIOGRAM COMPLETE
Area-P 1/2: 2.87 cm2
Height: 66 in
S' Lateral: 3.1 cm
Weight: 3408 oz

## 2020-10-27 MED ORDER — TECHNETIUM TC 99M TETROFOSMIN IV KIT
10.6000 | PACK | Freq: Once | INTRAVENOUS | Status: AC | PRN
  Administered 2020-10-27: 10.6 via INTRAVENOUS
  Filled 2020-10-27: qty 11

## 2020-10-27 MED ORDER — TECHNETIUM TC 99M TETROFOSMIN IV KIT
30.8000 | PACK | Freq: Once | INTRAVENOUS | Status: AC | PRN
  Administered 2020-10-27: 30.8 via INTRAVENOUS
  Filled 2020-10-27: qty 31

## 2020-10-27 MED ORDER — REGADENOSON 0.4 MG/5ML IV SOLN
0.4000 mg | Freq: Once | INTRAVENOUS | Status: AC
  Administered 2020-10-27: 0.4 mg via INTRAVENOUS

## 2020-10-29 NOTE — Pre-Procedure Instructions (Addendum)
Surgical Instructions    Your procedure is scheduled on Wednesday, March 23rd.  Report to [facility] Main Entrance "A" at 5:30 A.M., then check in with the Admitting office.  Call this number if you have problems the morning of surgery:  [phone]   If you have any questions prior to your surgery date call [phone]: Open Monday-Friday 8am-4pm    Remember:  Do not eat or drink after midnight the night before your surgery    Take these medicines the morning of surgery with A SIP OF WATER  Advair Inhaler Aspirin metoprolol (TOPROL-XL)  pantoprazole (PROTONIX)  rosuvastatin (CRESTOR)  Spriva  As needed: Albuterol Inhaler-please bring to the hospital Albuterol Nebulizer   Follow your surgeon's instructions on when to stop cilostazol (PLETAL).  If no instructions were given by your surgeon then you will need to call the office to get those instructions.    As of today, STOP taking any Aleve, Naproxen, Ibuprofen, Motrin, Advil, Goody's, BC's, all herbal medications, fish oil, and all vitamins. This includes: meloxicam (MOBIC).                      Do not wear jewelry.            Do not wear lotions, powders, colognes, or deodorant.            Men may shave face and neck.            Do not bring valuables to the hospital.            [facility] is not responsible for any belongings or valuables.  Do NOT Smoke (Tobacco/Vaping) or drink Alcohol 24 hours prior to your procedure If you use a CPAP at night, you may bring all equipment for your overnight stay.   Contacts, glasses, dentures or bridgework may not be worn into surgery, please bring cases for these belongings   For patients admitted to the hospital, discharge time will be determined by your treatment team.   Patients discharged the day of surgery will not be allowed to drive home, and someone needs to stay with them for 24 hours.    Special instructions:   [facility]- Preparing For Surgery  Before surgery, you  can play an important role. Because skin is not sterile, your skin needs to be as free of germs as possible. You can reduce the number of germs on your skin by washing with CHG (chlorahexidine gluconate) Soap before surgery.  CHG is an antiseptic cleaner which kills germs and bonds with the skin to continue killing germs even after washing.    Oral Hygiene is also important to reduce your risk of infection.  Remember - BRUSH YOUR TEETH THE MORNING OF SURGERY WITH YOUR REGULAR TOOTHPASTE  Please do not use if you have an allergy to CHG or antibacterial soaps. If your skin becomes reddened/irritated stop using the CHG.  Do not shave (including legs and underarms) for at least 48 hours prior to first CHG shower. It is OK to shave your face.  Please follow these instructions carefully.   1. Shower the NIGHT BEFORE SURGERY and the MORNING OF SURGERY  2. If you chose to wash your hair, wash your hair first as usual with your normal shampoo.  3. After you shampoo, rinse your hair and body thoroughly to remove the shampoo.  4. Use CHG Soap as you would any other liquid soap. You can apply CHG directly to the skin and  wash gently with a scrungie or a clean washcloth.   5. Apply the CHG Soap to your body ONLY FROM THE NECK DOWN.  Do not use on open wounds or open sores. Avoid contact with your eyes, ears, mouth and genitals (private parts). Wash Face and genitals (private parts)  with your normal soap.   6. Wash thoroughly, paying special attention to the area where your surgery will be performed.  7. Thoroughly rinse your body with warm water from the neck down.  8. DO NOT shower/wash with your normal soap after using and rinsing off the CHG Soap.  9. Pat yourself dry with a CLEAN TOWEL.  10. Wear CLEAN PAJAMAS to bed the night before surgery  11. Place CLEAN SHEETS on your bed the night before your surgery  12. DO NOT SLEEP WITH PETS.   Day of Surgery: Shower with CHG soap. Wear  Clean/Comfortable clothing the morning of surgery Do not apply any deodorants/lotions.   Remember to brush your teeth WITH YOUR REGULAR TOOTHPASTE.   Please read over the following fact sheets that you were given.

## 2020-10-30 ENCOUNTER — Encounter (HOSPITAL_COMMUNITY)
Admission: RE | Admit: 2020-10-30 | Discharge: 2020-10-30 | Disposition: A | Discharge status: Home / Self Care | Payer: Medicare PPO | Source: Ambulatory Visit | Attending: Vascular Surgery | Admitting: Vascular Surgery

## 2020-10-30 ENCOUNTER — Other Ambulatory Visit: Payer: Self-pay

## 2020-10-30 ENCOUNTER — Encounter (HOSPITAL_COMMUNITY): Payer: Self-pay

## 2020-10-30 DIAGNOSIS — Z01812 Encounter for preprocedural laboratory examination: Secondary | ICD-10-CM | POA: Insufficient documentation

## 2020-10-30 HISTORY — DX: Gastro-esophageal reflux disease without esophagitis: K21.9

## 2020-10-30 HISTORY — DX: Sleep apnea, unspecified: G47.30

## 2020-10-30 LAB — URINALYSIS, ROUTINE W REFLEX MICROSCOPIC
Bilirubin Urine: NEGATIVE
Glucose, UA: NEGATIVE mg/dL
Hgb urine dipstick: NEGATIVE
Ketones, ur: NEGATIVE mg/dL
Leukocytes,Ua: NEGATIVE
Nitrite: NEGATIVE
Protein, ur: NEGATIVE mg/dL
Specific Gravity, Urine: 1.011 (ref 1.005–1.030)
pH: 7 (ref 5.0–8.0)

## 2020-10-30 LAB — COMPREHENSIVE METABOLIC PANEL
ALT: 32 U/L (ref 0–44)
AST: 34 U/L (ref 15–41)
Albumin: 4.1 g/dL (ref 3.5–5.0)
Alkaline Phosphatase: 60 U/L (ref 38–126)
Anion gap: 8 (ref 5–15)
BUN: 9 mg/dL (ref 8–23)
CO2: 29 mmol/L (ref 22–32)
Calcium: 9.6 mg/dL (ref 8.9–10.3)
Chloride: 92 mmol/L — ABNORMAL LOW (ref 98–111)
Creatinine, Ser: 0.83 mg/dL (ref 0.61–1.24)
GFR, Estimated: >60 mL/min (ref >60)
Glucose, Bld: 102 mg/dL — ABNORMAL HIGH (ref 70–99)
Potassium: 4.2 mmol/L (ref 3.5–5.1)
Sodium: 129 mmol/L — ABNORMAL LOW (ref 135–145)
Total Bilirubin: 0.9 mg/dL (ref 0.3–1.2)
Total Protein: 7.3 g/dL (ref 6.5–8.1)

## 2020-10-30 LAB — SURGICAL PCR SCREEN
MRSA, PCR: NEGATIVE
Staphylococcus aureus: NEGATIVE

## 2020-10-30 LAB — CBC
HCT: 40.1 % (ref 39.0–52.0)
Hemoglobin: 14.3 g/dL (ref 13.0–17.0)
MCH: 36.1 pg — ABNORMAL HIGH (ref 26.0–34.0)
MCHC: 35.7 g/dL (ref 30.0–36.0)
MCV: 101.3 fL — ABNORMAL HIGH (ref 80.0–100.0)
Platelets: 225 10*3/uL (ref 150–400)
RBC: 3.96 MIL/uL — ABNORMAL LOW (ref 4.22–5.81)
RDW: 13.2 % (ref 11.5–15.5)
WBC: 8.6 10*3/uL (ref 4.0–10.5)
nRBC: 0 % (ref 0.0–0.2)

## 2020-10-30 LAB — APTT: aPTT: 32 seconds (ref 24–36)

## 2020-10-30 LAB — TYPE AND SCREEN
ABO/RH(D): O NEG
Antibody Screen: NEGATIVE

## 2020-10-30 LAB — PROTIME-INR
INR: 1 (ref 0.8–1.2)
Prothrombin Time: 12.3 seconds (ref 11.4–15.2)

## 2020-10-30 NOTE — Progress Notes (Signed)
PCP - Dr. Leota Sauers  Cardiologist - denies  Chest x-ray - n/a EKG - 10/05/20 Stress Test - 10/27/20 ECHO - 10/27/20 Cardiac Cath - denies  Sleep Study - OSA+ CPAP - uses CPAP nightly   Blood Thinner Instructions: Pletal; pt stated that he did not receive any instructions. Attempted to call office but office had not open up yet. Told pt to call office later today for exact instructions and clarification.  Aspirin Instructions: continue  COVID TEST- 10/31/20   Anesthesia review: Yes, cardiac clearance.   Patient denies shortness of breath, fever, cough and chest pain at PAT appointment   All instructions explained to the patient, with a verbal understanding of the material. Patient agrees to go over the instructions while at home for a better understanding. Patient also instructed to self quarantine after being tested for COVID-19. The opportunity to ask questions was provided.

## 2020-10-31 ENCOUNTER — Other Ambulatory Visit (HOSPITAL_COMMUNITY)
Admission: RE | Admit: 2020-10-31 | Discharge: 2020-10-31 | Disposition: A | Discharge status: Home / Self Care | Payer: Medicare PPO | Source: Ambulatory Visit | Attending: Vascular Surgery | Admitting: Vascular Surgery

## 2020-10-31 DIAGNOSIS — Z01812 Encounter for preprocedural laboratory examination: Secondary | ICD-10-CM | POA: Diagnosis present

## 2020-10-31 DIAGNOSIS — Z20822 Contact with and (suspected) exposure to covid-19: Secondary | ICD-10-CM | POA: Insufficient documentation

## 2020-11-01 LAB — SARS CORONAVIRUS 2 (TAT 6-24 HRS): SARS Coronavirus 2: NEGATIVE

## 2020-11-02 NOTE — Progress Notes (Signed)
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 Patient was referred by Dr. Donzetta Matters to cardiology for preop eval.  Seen by Dr. Johney Frame 10/05/2020. Given limited activity level and risk factors, Lexiscan and echo ordered.  Lexiscan was interpreted as low risk, echo showed EF 55 to 60%, no significant valvular abnormalities.  Dr. Johney Frame commented on results stating, "Stress test is low risk. No further cardiac work-up needed prior to vascular surgery."  Hx of COPD maintained on Advair and Spiriva.   OSA on CPAP.  Preop labs, sodium mildly low at 129, otherwise unremarkable.  Repeat i-STAT Chem-8 on day of surgery.  EKG 10/05/2020: NSR.  Rate 88.  PFTs 09/30/20 (care everywhere): PRE-BRONCHODILATOR SPIROMETRY Obstruction present: Severe FEV1 35-49%  BRONCHODILATOR RESPONSE AND POST-BRONCHODILATOR SPIROMETRY No clinically significant effect of bronchodilators  LUNG VOLUMES No restriction present (TLC => 80% predicted)  Other abnormalities Air trapping (RV/TLC elevated)  DLCO Moderate gas transfer defect (DLCO or DLCOAdj 40-60% predicted)  Nuclear stress 10/27/20:  The left ventricular ejection fraction is normal (55-65%).  Nuclear stress EF: 59%.  There was no ST segment deviation noted during stress.  This is a low risk study.   There is a medium size, fixed defect of mild severity present in the basal inferior, mid inferior and apical inferior location. Wall motion is normal in this region, and there is significant extracardiac bowel tracer uptake adjacent to the perfusion defect. These findings in combination suggest defect represents attenuation artifact, however, cannot comment with certainty on inferior wall. No high risk ischemia noted. Overall study appears low risk.   TTE 10/27/20: 1. Left ventricular ejection fraction, by estimation, is 55 to 60%. The  left ventricle has normal function.  The left ventricle has no regional  wall motion abnormalities. There is mild left ventricular hypertrophy of  the posterior segment. Left  ventricular diastolic parameters are consistent with Grade I diastolic  dysfunction (impaired relaxation).  2. Right ventricular systolic function is normal. The right ventricular  size is normal. There is normal pulmonary artery systolic pressure.  3. The mitral valve is normal in structure. No evidence of mitral valve  regurgitation. No evidence of mitral stenosis.  4. The aortic valve is calcified. There is mild calcification of the  aortic valve. There is mild thickening of the aortic valve. Aortic valve  regurgitation is trivial. No aortic stenosis is present.  5. Aortic dilatation noted. There is mild dilatation of the ascending  aorta, measuring 38 mm.  6. The inferior vena cava is normal in size with greater than 50%  respiratory variability, suggesting right atrial pressure of 3 mmHg.    Wynonia Musty [facility] Short Stay Center/Anesthesiology Phone [phone] 11/02/2020 3:20 PM

## 2020-11-02 NOTE — Anesthesia Preprocedure Evaluation (Addendum)
Anesthesia Evaluation  Patient identified by MRN, date of birth, ID band Patient awake    Reviewed: Allergy & Precautions, NPO status , Patient's Chart, lab work & pertinent test results  Airway Mallampati: III  TM Distance: >3 FB Neck ROM: Full    Dental  (+) Missing   Pulmonary sleep apnea and Continuous Positive Airway Pressure Ventilation , COPD,  COPD inhaler, former smoker,    Pulmonary exam normal breath sounds clear to auscultation       Cardiovascular hypertension, Pt. on medications and Pt. on home beta blockers + Peripheral Vascular Disease  Normal cardiovascular exam Rhythm:Regular Rate:Normal     Neuro/Psych negative neurological ROS  negative psych ROS   GI/Hepatic Neg liver ROS, GERD  Medicated and Controlled,  Endo/Other  negative endocrine ROS  Renal/GU negative Renal ROS     Musculoskeletal  (+) Arthritis ,   Abdominal (+) + obese,   Peds  Hematology HLD   Anesthesia Other Findings CLAUDICATION  Reproductive/Obstetrics                          Anesthesia Physical Anesthesia Plan  ASA: III  Anesthesia Plan: General   Post-op Pain Management:    Induction: Intravenous  PONV Risk Score and Plan: 2 and Ondansetron, Dexamethasone, Midazolam and Treatment may vary due to age or medical condition  Airway Management Planned: Oral ETT  Additional Equipment: Arterial line  Intra-op Plan:   Post-operative Plan: Extubation in OR  Informed Consent: I have reviewed the patients History and Physical, chart, labs and discussed the procedure including the risks, benefits and alternatives for the proposed anesthesia with the patient or authorized representative who has indicated his/her understanding and acceptance.     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 Patient was referred by Dr. Donzetta Matters to cardiology for preop eval.  Seen by Dr. Johney Frame 10/05/2020. Given limited activity level and risk factors, Lexiscan and echo ordered.  Lexiscan was interpreted as low risk, echo showed EF 55 to 60%, no significant valvular abnormalities.  Dr. Johney Frame commented on results stating, "Stress test is low risk. No further cardiac work-up needed prior to vascular surgery."  Hx of COPD maintained on Advair and Spiriva.   OSA on CPAP.  Preop labs, sodium mildly low at 129, otherwise unremarkable.  Repeat i-STAT Chem-8 on day of surgery.  EKG 10/05/2020: NSR.  Rate 88.  PFTs 09/30/20 (care everywhere): PRE-BRONCHODILATOR SPIROMETRY Obstruction present: Severe FEV1 35-49%  BRONCHODILATOR RESPONSE AND POST-BRONCHODILATOR SPIROMETRY No clinically significant effect of bronchodilators  LUNG VOLUMES No restriction present (TLC => 80% predicted)  Other abnormalities Air trapping (RV/TLC elevated)  DLCO Moderate gas transfer defect (DLCO or DLCOAdj 40-60% predicted)  Nuclear stress 10/27/20: The left ventricular ejection fraction is normal (55-65%). Nuclear stress EF: 59%. There was no ST segment deviation noted during stress. This is a low risk study.   There is a medium size, fixed defect of mild severity present in the basal inferior, mid inferior and apical inferior location. Wall motion is normal in this region, and there is significant extracardiac bowel tracer uptake adjacent to the perfusion defect. These findings in combination suggest defect represents attenuation artifact, however, cannot comment with certainty on inferior wall. No high risk ischemia noted. Overall study appears low risk.   TTE 10/27/20: 1. Left ventricular ejection fraction, by  estimation, is 55 to 60%. The  left ventricle has normal function. The left ventricle has no regional  wall motion abnormalities. There is mild  left ventricular hypertrophy of  the posterior segment. Left  ventricular diastolic parameters are consistent with Grade I diastolic  dysfunction (impaired relaxation).  2. Right ventricular systolic function is normal. The right ventricular  size is normal. There is normal pulmonary artery systolic pressure.  3. The mitral valve is normal in structure. No evidence of mitral valve  regurgitation. No evidence of mitral stenosis.  4. The aortic valve is calcified. There is mild calcification of the  aortic valve. There is mild thickening of the aortic valve. Aortic valve  regurgitation is trivial. No aortic stenosis is present.  5. Aortic dilatation noted. There is mild dilatation of the ascending  aorta, measuring 38 mm.  6. The inferior vena cava is normal in size with greater than 50%  respiratory variability, suggesting right atrial pressure of 3 mmHg.   )      Anesthesia Quick Evaluation

## 2020-11-04 ENCOUNTER — Encounter (HOSPITAL_COMMUNITY)
Admission: RE | Disposition: A | Discharge status: Home / Self Care | Payer: Self-pay | Source: Home / Self Care | Attending: Vascular Surgery

## 2020-11-04 ENCOUNTER — Inpatient Hospital Stay (HOSPITAL_COMMUNITY): Payer: No Typology Code available for payment source | Admitting: Physician Assistant

## 2020-11-04 ENCOUNTER — Inpatient Hospital Stay (HOSPITAL_COMMUNITY): Payer: No Typology Code available for payment source

## 2020-11-04 ENCOUNTER — Inpatient Hospital Stay (HOSPITAL_COMMUNITY)
Admission: RE | Admit: 2020-11-04 | Discharge: 2020-11-07 | DRG: 253 | Disposition: A | Discharge status: Home / Self Care | Payer: No Typology Code available for payment source | Attending: Vascular Surgery | Admitting: Vascular Surgery

## 2020-11-04 ENCOUNTER — Other Ambulatory Visit: Payer: Self-pay

## 2020-11-04 ENCOUNTER — Encounter (HOSPITAL_COMMUNITY): Payer: Self-pay | Admitting: Vascular Surgery

## 2020-11-04 DIAGNOSIS — E785 Hyperlipidemia, unspecified: Secondary | ICD-10-CM | POA: Diagnosis present

## 2020-11-04 DIAGNOSIS — I739 Peripheral vascular disease, unspecified: Secondary | ICD-10-CM | POA: Diagnosis present

## 2020-11-04 DIAGNOSIS — Z825 Family history of asthma and other chronic lower respiratory diseases: Secondary | ICD-10-CM

## 2020-11-04 DIAGNOSIS — Z833 Family history of diabetes mellitus: Secondary | ICD-10-CM

## 2020-11-04 DIAGNOSIS — D62 Acute posthemorrhagic anemia: Secondary | ICD-10-CM | POA: Diagnosis not present

## 2020-11-04 DIAGNOSIS — J449 Chronic obstructive pulmonary disease, unspecified: Secondary | ICD-10-CM | POA: Diagnosis present

## 2020-11-04 DIAGNOSIS — Z79899 Other long term (current) drug therapy: Secondary | ICD-10-CM | POA: Diagnosis not present

## 2020-11-04 DIAGNOSIS — Z87891 Personal history of nicotine dependence: Secondary | ICD-10-CM

## 2020-11-04 DIAGNOSIS — Z888 Allergy status to other drugs, medicaments and biological substances status: Secondary | ICD-10-CM | POA: Diagnosis not present

## 2020-11-04 DIAGNOSIS — Z8249 Family history of ischemic heart disease and other diseases of the circulatory system: Secondary | ICD-10-CM | POA: Diagnosis not present

## 2020-11-04 DIAGNOSIS — I10 Essential (primary) hypertension: Secondary | ICD-10-CM | POA: Diagnosis present

## 2020-11-04 DIAGNOSIS — I70222 Atherosclerosis of native arteries of extremities with rest pain, left leg: Principal | ICD-10-CM | POA: Diagnosis present

## 2020-11-04 DIAGNOSIS — K219 Gastro-esophageal reflux disease without esophagitis: Secondary | ICD-10-CM | POA: Diagnosis present

## 2020-11-04 DIAGNOSIS — Z8261 Family history of arthritis: Secondary | ICD-10-CM

## 2020-11-04 DIAGNOSIS — G4730 Sleep apnea, unspecified: Secondary | ICD-10-CM | POA: Diagnosis present

## 2020-11-04 DIAGNOSIS — I70201 Unspecified atherosclerosis of native arteries of extremities, right leg: Secondary | ICD-10-CM | POA: Diagnosis present

## 2020-11-04 DIAGNOSIS — M1990 Unspecified osteoarthritis, unspecified site: Secondary | ICD-10-CM | POA: Diagnosis present

## 2020-11-04 DIAGNOSIS — R0600 Dyspnea, unspecified: Secondary | ICD-10-CM

## 2020-11-04 HISTORY — PX: PATCH ANGIOPLASTY: SHX6230

## 2020-11-04 HISTORY — PX: FEMORAL-FEMORAL BYPASS GRAFT: SHX936

## 2020-11-04 HISTORY — PX: ULTRASOUND GUIDANCE FOR VASCULAR ACCESS: SHX6516

## 2020-11-04 HISTORY — PX: ENDARTERECTOMY FEMORAL: SHX5804

## 2020-11-04 LAB — POCT I-STAT, CHEM 8
BUN: 13 mg/dL (ref 8–23)
Calcium, Ion: 1.17 mmol/L (ref 1.15–1.40)
Chloride: 98 mmol/L (ref 98–111)
Creatinine, Ser: 0.8 mg/dL (ref 0.61–1.24)
Glucose, Bld: 106 mg/dL — ABNORMAL HIGH (ref 70–99)
HCT: 41 % (ref 39.0–52.0)
Hemoglobin: 13.9 g/dL (ref 13.0–17.0)
Potassium: 4 mmol/L (ref 3.5–5.1)
Sodium: 135 mmol/L (ref 135–145)
TCO2: 26 mmol/L (ref 22–32)

## 2020-11-04 LAB — CBC
HCT: 31.5 % — ABNORMAL LOW (ref 39.0–52.0)
Hemoglobin: 10.7 g/dL — ABNORMAL LOW (ref 13.0–17.0)
MCH: 35.4 pg — ABNORMAL HIGH (ref 26.0–34.0)
MCHC: 34 g/dL (ref 30.0–36.0)
MCV: 104.3 fL — ABNORMAL HIGH (ref 80.0–100.0)
Platelets: 198 10*3/uL (ref 150–400)
RBC: 3.02 MIL/uL — ABNORMAL LOW (ref 4.22–5.81)
RDW: 13.6 % (ref 11.5–15.5)
WBC: 16.3 10*3/uL — ABNORMAL HIGH (ref 4.0–10.5)
nRBC: 0 % (ref 0.0–0.2)

## 2020-11-04 LAB — BASIC METABOLIC PANEL
Anion gap: 7 (ref 5–15)
BUN: 9 mg/dL (ref 8–23)
CO2: 26 mmol/L (ref 22–32)
Calcium: 8.5 mg/dL — ABNORMAL LOW (ref 8.9–10.3)
Chloride: 100 mmol/L (ref 98–111)
Creatinine, Ser: 0.82 mg/dL (ref 0.61–1.24)
GFR, Estimated: >60 mL/min (ref >60)
Glucose, Bld: 150 mg/dL — ABNORMAL HIGH (ref 70–99)
Potassium: 4.1 mmol/L (ref 3.5–5.1)
Sodium: 133 mmol/L — ABNORMAL LOW (ref 135–145)

## 2020-11-04 LAB — POCT I-STAT 7, (LYTES, BLD GAS, ICA,H+H)
Acid-Base Excess: 0 mmol/L (ref 0.0–2.0)
Bicarbonate: 25.9 mmol/L (ref 20.0–28.0)
Calcium, Ion: 1.15 mmol/L (ref 1.15–1.40)
HCT: 31 % — ABNORMAL LOW (ref 39.0–52.0)
Hemoglobin: 10.5 g/dL — ABNORMAL LOW (ref 13.0–17.0)
O2 Saturation: 98 %
Patient temperature: 36.8
Potassium: 4.3 mmol/L (ref 3.5–5.1)
Sodium: 133 mmol/L — ABNORMAL LOW (ref 135–145)
TCO2: 27 mmol/L (ref 22–32)
pCO2 arterial: 49.1 mmHg — ABNORMAL HIGH (ref 32.0–48.0)
pH, Arterial: 7.329 — ABNORMAL LOW (ref 7.350–7.450)
pO2, Arterial: 109 mmHg — ABNORMAL HIGH (ref 83.0–108.0)

## 2020-11-04 LAB — ABO/RH: ABO/RH(D): O NEG

## 2020-11-04 SURGERY — ENDARTERECTOMY, FEMORAL
Anesthesia: General | Site: Groin | Laterality: Left

## 2020-11-04 MED ORDER — UMECLIDINIUM BROMIDE 62.5 MCG/INH IN AEPB
1.0000 | INHALATION_SPRAY | Freq: Every day | RESPIRATORY_TRACT | Status: DC
  Administered 2020-11-05 – 2020-11-07 (×3): 1 via RESPIRATORY_TRACT
  Filled 2020-11-04: qty 7

## 2020-11-04 MED ORDER — PROPOFOL 10 MG/ML IV BOLUS
INTRAVENOUS | Status: AC
  Filled 2020-11-04: qty 20

## 2020-11-04 MED ORDER — HEPARIN SODIUM (PORCINE) 5000 UNIT/ML IJ SOLN
5000.0000 [IU] | Freq: Three times a day (TID) | INTRAMUSCULAR | Status: DC
  Administered 2020-11-04 – 2020-11-07 (×8): 5000 [IU] via SUBCUTANEOUS
  Filled 2020-11-04 (×8): qty 1

## 2020-11-04 MED ORDER — HEPARIN SODIUM (PORCINE) 1000 UNIT/ML IJ SOLN
INTRAMUSCULAR | Status: DC | PRN
  Administered 2020-11-04: 10000 [IU] via INTRAVENOUS
  Administered 2020-11-04 (×2): 5000 [IU] via INTRAVENOUS

## 2020-11-04 MED ORDER — PANTOPRAZOLE SODIUM 40 MG PO TBEC
40.0000 mg | DELAYED_RELEASE_TABLET | Freq: Two times a day (BID) | ORAL | Status: DC
  Administered 2020-11-04 – 2020-11-07 (×6): 40 mg via ORAL
  Filled 2020-11-04 (×6): qty 1

## 2020-11-04 MED ORDER — SENNOSIDES-DOCUSATE SODIUM 8.6-50 MG PO TABS: 1.0000 | ORAL_TABLET | Freq: Every evening | ORAL | Status: DC | PRN

## 2020-11-04 MED ORDER — LIDOCAINE 2% (20 MG/ML) 5 ML SYRINGE
INTRAMUSCULAR | Status: DC | PRN
  Administered 2020-11-04: 60 mg via INTRAVENOUS

## 2020-11-04 MED ORDER — PHENYLEPHRINE 40 MCG/ML (10ML) SYRINGE FOR IV PUSH (FOR BLOOD PRESSURE SUPPORT)
PREFILLED_SYRINGE | INTRAVENOUS | Status: DC | PRN
  Administered 2020-11-04 (×3): 40 ug via INTRAVENOUS

## 2020-11-04 MED ORDER — ALBUTEROL SULFATE HFA 108 (90 BASE) MCG/ACT IN AERS
2.0000 | INHALATION_SPRAY | Freq: Four times a day (QID) | RESPIRATORY_TRACT | Status: DC | PRN
  Filled 2020-11-04: qty 6.7

## 2020-11-04 MED ORDER — METOPROLOL TARTRATE 5 MG/5ML IV SOLN: 2.0000 mg | INTRAVENOUS | Status: DC | PRN

## 2020-11-04 MED ORDER — ONDANSETRON HCL 4 MG/2ML IJ SOLN
INTRAMUSCULAR | Status: DC | PRN
  Administered 2020-11-04: 4 mg via INTRAVENOUS

## 2020-11-04 MED ORDER — BISACODYL 5 MG PO TBEC: 5.0000 mg | DELAYED_RELEASE_TABLET | Freq: Every day | ORAL | Status: DC | PRN

## 2020-11-04 MED ORDER — ALUM & MAG HYDROXIDE-SIMETH 200-200-20 MG/5ML PO SUSP: 15.0000 mL | ORAL | Status: DC | PRN

## 2020-11-04 MED ORDER — HYDROCHLOROTHIAZIDE 25 MG PO TABS
25.0000 mg | ORAL_TABLET | Freq: Every day | ORAL | Status: DC
  Administered 2020-11-04 – 2020-11-07 (×4): 25 mg via ORAL
  Filled 2020-11-04 (×4): qty 1

## 2020-11-04 MED ORDER — FENTANYL CITRATE (PF) 250 MCG/5ML IJ SOLN
INTRAMUSCULAR | Status: AC
  Filled 2020-11-04: qty 5

## 2020-11-04 MED ORDER — CHLORHEXIDINE GLUCONATE CLOTH 2 % EX PADS: 6.0000 | MEDICATED_PAD | Freq: Once | CUTANEOUS | Status: DC

## 2020-11-04 MED ORDER — ONDANSETRON HCL 4 MG/2ML IJ SOLN: 4.0000 mg | Freq: Four times a day (QID) | INTRAMUSCULAR | Status: DC | PRN

## 2020-11-04 MED ORDER — MOMETASONE FURO-FORMOTEROL FUM 200-5 MCG/ACT IN AERO
2.0000 | INHALATION_SPRAY | Freq: Two times a day (BID) | RESPIRATORY_TRACT | Status: DC
  Administered 2020-11-05 – 2020-11-07 (×5): 2 via RESPIRATORY_TRACT
  Filled 2020-11-04 (×2): qty 8.8

## 2020-11-04 MED ORDER — IPRATROPIUM-ALBUTEROL 0.5-2.5 (3) MG/3ML IN SOLN
RESPIRATORY_TRACT | Status: AC
  Administered 2020-11-04: 3 mL via RESPIRATORY_TRACT
  Filled 2020-11-04: qty 3

## 2020-11-04 MED ORDER — OXYCODONE HCL 5 MG PO TABS: 5.0000 mg | ORAL_TABLET | ORAL | Status: DC | PRN

## 2020-11-04 MED ORDER — PROPOFOL 10 MG/ML IV BOLUS
INTRAVENOUS | Status: DC | PRN
  Administered 2020-11-04: 150 mg via INTRAVENOUS
  Administered 2020-11-04: 20 mg via INTRAVENOUS
  Administered 2020-11-04: 50 mg via INTRAVENOUS

## 2020-11-04 MED ORDER — THIAMINE HCL 100 MG PO TABS
100.0000 mg | ORAL_TABLET | Freq: Every day | ORAL | Status: DC
  Administered 2020-11-04 – 2020-11-07 (×4): 100 mg via ORAL
  Filled 2020-11-04 (×4): qty 1

## 2020-11-04 MED ORDER — LACTATED RINGERS IV SOLN: INTRAVENOUS | Status: DC | PRN

## 2020-11-04 MED ORDER — HEPARIN SODIUM (PORCINE) 1000 UNIT/ML IJ SOLN
INTRAMUSCULAR | Status: AC
  Filled 2020-11-04: qty 1

## 2020-11-04 MED ORDER — ROCURONIUM BROMIDE 10 MG/ML (PF) SYRINGE
PREFILLED_SYRINGE | INTRAVENOUS | Status: AC
  Filled 2020-11-04: qty 10

## 2020-11-04 MED ORDER — BENAZEPRIL HCL 5 MG PO TABS
10.0000 mg | ORAL_TABLET | Freq: Every day | ORAL | Status: DC
  Administered 2020-11-04 – 2020-11-06 (×3): 10 mg via ORAL
  Filled 2020-11-04 (×3): qty 2

## 2020-11-04 MED ORDER — ACETAMINOPHEN 325 MG PO TABS
325.0000 mg | ORAL_TABLET | ORAL | Status: DC | PRN
  Administered 2020-11-04 – 2020-11-07 (×2): 650 mg via ORAL
  Filled 2020-11-04 (×2): qty 2

## 2020-11-04 MED ORDER — HYDROMORPHONE HCL 1 MG/ML IJ SOLN
0.5000 mg | INTRAMUSCULAR | Status: DC | PRN
Start: 2020-11-04 — End: 2020-11-07

## 2020-11-04 MED ORDER — HYDRALAZINE HCL 20 MG/ML IJ SOLN: 5.0000 mg | INTRAMUSCULAR | Status: DC | PRN

## 2020-11-04 MED ORDER — FLEET ENEMA 7-19 GM/118ML RE ENEM: 1.0000 | ENEMA | Freq: Once | RECTAL | Status: DC | PRN

## 2020-11-04 MED ORDER — NIACIN 100 MG PO TABS
100.0000 mg | ORAL_TABLET | Freq: Every day | ORAL | Status: DC
  Administered 2020-11-04 – 2020-11-06 (×2): 100 mg via ORAL
  Filled 2020-11-04 (×3): qty 1

## 2020-11-04 MED ORDER — CEFAZOLIN SODIUM-DEXTROSE 2-4 GM/100ML-% IV SOLN
2.0000 g | INTRAVENOUS | Status: AC
  Administered 2020-11-04: 2 g via INTRAVENOUS
  Filled 2020-11-04: qty 100

## 2020-11-04 MED ORDER — FOLIC ACID 1 MG PO TABS
1.0000 mg | ORAL_TABLET | Freq: Every day | ORAL | Status: DC
  Administered 2020-11-04 – 2020-11-07 (×4): 1 mg via ORAL
  Filled 2020-11-04 (×4): qty 1

## 2020-11-04 MED ORDER — IPRATROPIUM-ALBUTEROL 0.5-2.5 (3) MG/3ML IN SOLN: 3.0000 mL | Freq: Once | RESPIRATORY_TRACT | Status: AC

## 2020-11-04 MED ORDER — SUGAMMADEX SODIUM 200 MG/2ML IV SOLN
INTRAVENOUS | Status: DC | PRN
  Administered 2020-11-04: 200 mg via INTRAVENOUS

## 2020-11-04 MED ORDER — DOCUSATE SODIUM 100 MG PO CAPS
100.0000 mg | ORAL_CAPSULE | Freq: Every day | ORAL | Status: DC
  Administered 2020-11-05 – 2020-11-07 (×3): 100 mg via ORAL
  Filled 2020-11-04 (×3): qty 1

## 2020-11-04 MED ORDER — PANTOPRAZOLE SODIUM 40 MG PO TBEC: 40.0000 mg | DELAYED_RELEASE_TABLET | Freq: Every day | ORAL | Status: DC

## 2020-11-04 MED ORDER — GUAIFENESIN-DM 100-10 MG/5ML PO SYRP: 15.0000 mL | ORAL_SOLUTION | ORAL | Status: DC | PRN

## 2020-11-04 MED ORDER — PROTAMINE SULFATE 10 MG/ML IV SOLN
INTRAVENOUS | Status: DC | PRN
  Administered 2020-11-04 (×2): 10 mg via INTRAVENOUS
  Administered 2020-11-04: 20 mg via INTRAVENOUS
  Administered 2020-11-04: 10 mg via INTRAVENOUS

## 2020-11-04 MED ORDER — ACETAMINOPHEN 650 MG RE SUPP: 325.0000 mg | RECTAL | Status: DC | PRN

## 2020-11-04 MED ORDER — FENTANYL CITRATE (PF) 100 MCG/2ML IJ SOLN: 25.0000 ug | INTRAMUSCULAR | Status: DC | PRN

## 2020-11-04 MED ORDER — ONDANSETRON HCL 4 MG/2ML IJ SOLN
INTRAMUSCULAR | Status: AC
  Filled 2020-11-04: qty 2

## 2020-11-04 MED ORDER — ONDANSETRON HCL 4 MG/2ML IJ SOLN: 4.0000 mg | Freq: Once | INTRAMUSCULAR | Status: DC | PRN

## 2020-11-04 MED ORDER — ASPIRIN EC 81 MG PO TBEC
81.0000 mg | DELAYED_RELEASE_TABLET | Freq: Every day | ORAL | Status: DC
Start: 2020-11-05 — End: 2020-11-07
  Administered 2020-11-05 – 2020-11-07 (×3): 81 mg via ORAL
  Filled 2020-11-04 (×3): qty 1

## 2020-11-04 MED ORDER — METOPROLOL SUCCINATE ER 100 MG PO TB24
200.0000 mg | ORAL_TABLET | Freq: Every day | ORAL | Status: DC
  Administered 2020-11-04 – 2020-11-07 (×4): 200 mg via ORAL
  Filled 2020-11-04 (×4): qty 2

## 2020-11-04 MED ORDER — PHENYLEPHRINE 40 MCG/ML (10ML) SYRINGE FOR IV PUSH (FOR BLOOD PRESSURE SUPPORT)
PREFILLED_SYRINGE | INTRAVENOUS | Status: AC
  Filled 2020-11-04: qty 10

## 2020-11-04 MED ORDER — CEFAZOLIN SODIUM-DEXTROSE 2-4 GM/100ML-% IV SOLN
2.0000 g | Freq: Three times a day (TID) | INTRAVENOUS | Status: AC
  Administered 2020-11-04 – 2020-11-05 (×2): 2 g via INTRAVENOUS
  Filled 2020-11-04 (×2): qty 100

## 2020-11-04 MED ORDER — DEXAMETHASONE SODIUM PHOSPHATE 10 MG/ML IJ SOLN
INTRAMUSCULAR | Status: DC | PRN
  Administered 2020-11-04: 10 mg via INTRAVENOUS

## 2020-11-04 MED ORDER — 0.9 % SODIUM CHLORIDE (POUR BTL) OPTIME
TOPICAL | Status: DC | PRN
  Administered 2020-11-04: 2000 mL

## 2020-11-04 MED ORDER — TIOTROPIUM BROMIDE MONOHYDRATE 18 MCG IN CAPS: 18.0000 ug | ORAL_CAPSULE | Freq: Every day | RESPIRATORY_TRACT | Status: DC

## 2020-11-04 MED ORDER — PHENYLEPHRINE HCL-NACL 10-0.9 MG/250ML-% IV SOLN
INTRAVENOUS | Status: DC | PRN
  Administered 2020-11-04: 10 ug/min via INTRAVENOUS
  Administered 2020-11-04: 50 ug/min via INTRAVENOUS

## 2020-11-04 MED ORDER — ALBUMIN HUMAN 5 % IV SOLN: INTRAVENOUS | Status: DC | PRN

## 2020-11-04 MED ORDER — DEXAMETHASONE SODIUM PHOSPHATE 10 MG/ML IJ SOLN
INTRAMUSCULAR | Status: AC
  Filled 2020-11-04: qty 1

## 2020-11-04 MED ORDER — LABETALOL HCL 5 MG/ML IV SOLN
10.0000 mg | INTRAVENOUS | Status: DC | PRN
  Administered 2020-11-04: 10 mg via INTRAVENOUS
  Filled 2020-11-04 (×2): qty 4

## 2020-11-04 MED ORDER — SODIUM CHLORIDE 0.9 % IV SOLN
INTRAVENOUS | Status: AC
  Filled 2020-11-04: qty 1.2

## 2020-11-04 MED ORDER — HEMOSTATIC AGENTS (NO CHARGE) OPTIME
TOPICAL | Status: DC | PRN
  Administered 2020-11-04 (×2): 1 via TOPICAL

## 2020-11-04 MED ORDER — FENTANYL CITRATE (PF) 100 MCG/2ML IJ SOLN
INTRAMUSCULAR | Status: DC | PRN
  Administered 2020-11-04: 25 ug via INTRAVENOUS
  Administered 2020-11-04: 50 ug via INTRAVENOUS
  Administered 2020-11-04: 150 ug via INTRAVENOUS
  Administered 2020-11-04: 25 ug via INTRAVENOUS

## 2020-11-04 MED ORDER — LIDOCAINE 2% (20 MG/ML) 5 ML SYRINGE
INTRAMUSCULAR | Status: AC
  Filled 2020-11-04: qty 5

## 2020-11-04 MED ORDER — ACETAMINOPHEN 500 MG PO TABS
1000.0000 mg | ORAL_TABLET | Freq: Once | ORAL | Status: AC
  Administered 2020-11-04: 1000 mg via ORAL
  Filled 2020-11-04: qty 2

## 2020-11-04 MED ORDER — ROSUVASTATIN CALCIUM 5 MG PO TABS
10.0000 mg | ORAL_TABLET | Freq: Every day | ORAL | Status: DC
  Administered 2020-11-05 – 2020-11-07 (×3): 10 mg via ORAL
  Filled 2020-11-04 (×3): qty 2

## 2020-11-04 MED ORDER — EPHEDRINE 5 MG/ML INJ
INTRAVENOUS | Status: AC
  Filled 2020-11-04: qty 10

## 2020-11-04 MED ORDER — ALBUTEROL SULFATE (2.5 MG/3ML) 0.083% IN NEBU: 2.5000 mg | INHALATION_SOLUTION | Freq: Four times a day (QID) | RESPIRATORY_TRACT | Status: DC | PRN

## 2020-11-04 MED ORDER — CHLORHEXIDINE GLUCONATE 0.12 % MT SOLN
OROMUCOSAL | Status: AC
  Administered 2020-11-04: 15 mL
  Filled 2020-11-04: qty 15

## 2020-11-04 MED ORDER — SODIUM CHLORIDE 0.9 % IV SOLN: 500.0000 mL | Freq: Once | INTRAVENOUS | Status: DC | PRN

## 2020-11-04 MED ORDER — PHENOL 1.4 % MT LIQD: 1.0000 | OROMUCOSAL | Status: DC | PRN

## 2020-11-04 MED ORDER — DEXMEDETOMIDINE (PRECEDEX) IN NS 20 MCG/5ML (4 MCG/ML) IV SYRINGE
PREFILLED_SYRINGE | INTRAVENOUS | Status: DC | PRN
  Administered 2020-11-04: 4 ug via INTRAVENOUS
  Administered 2020-11-04: 8 ug via INTRAVENOUS

## 2020-11-04 MED ORDER — POTASSIUM CHLORIDE CRYS ER 20 MEQ PO TBCR: 20.0000 meq | EXTENDED_RELEASE_TABLET | Freq: Every day | ORAL | Status: DC | PRN

## 2020-11-04 MED ORDER — LABETALOL HCL 5 MG/ML IV SOLN
INTRAVENOUS | Status: AC
  Filled 2020-11-04: qty 4

## 2020-11-04 MED ORDER — IODIXANOL 320 MG/ML IV SOLN
INTRAVENOUS | Status: DC | PRN
  Administered 2020-11-04: 40 mL

## 2020-11-04 MED ORDER — HEPARIN SODIUM (PORCINE) 5000 UNIT/ML IJ SOLN: 5000.0000 [IU] | Freq: Three times a day (TID) | INTRAMUSCULAR | Status: DC

## 2020-11-04 MED ORDER — SODIUM CHLORIDE 0.9 % IV SOLN
INTRAVENOUS | Status: DC | PRN
  Administered 2020-11-04: 500 mL

## 2020-11-04 MED ORDER — MELOXICAM 7.5 MG PO TABS
15.0000 mg | ORAL_TABLET | Freq: Every day | ORAL | Status: DC
  Administered 2020-11-04 – 2020-11-07 (×4): 15 mg via ORAL
  Filled 2020-11-04 (×4): qty 2

## 2020-11-04 MED ORDER — MAGNESIUM SULFATE 2 GM/50ML IV SOLN: 2.0000 g | Freq: Every day | INTRAVENOUS | Status: DC | PRN

## 2020-11-04 MED ORDER — ROCURONIUM BROMIDE 10 MG/ML (PF) SYRINGE
PREFILLED_SYRINGE | INTRAVENOUS | Status: DC | PRN
  Administered 2020-11-04: 20 mg via INTRAVENOUS
  Administered 2020-11-04: 30 mg via INTRAVENOUS
  Administered 2020-11-04: 70 mg via INTRAVENOUS
  Administered 2020-11-04: 10 mg via INTRAVENOUS

## 2020-11-04 MED ORDER — CILOSTAZOL 50 MG PO TABS
100.0000 mg | ORAL_TABLET | Freq: Two times a day (BID) | ORAL | Status: DC
  Administered 2020-11-04 – 2020-11-07 (×6): 100 mg via ORAL
  Filled 2020-11-04 (×6): qty 2

## 2020-11-04 MED ORDER — SODIUM CHLORIDE 0.9 % IV SOLN: INTRAVENOUS | Status: DC

## 2020-11-04 SURGICAL SUPPLY — 70 items
CANISTER SUCT 3000ML PPV (MISCELLANEOUS) ×4 IMPLANT
CATH BEACON 5 .035 40 KMP TP (CATHETERS) ×3 IMPLANT
CATH BEACON 5 .035 65 KMP TIP (CATHETERS) IMPLANT
CATH BEACON 5 .038 40 KMP TP (CATHETERS) ×1
CATH CROSS OVER TEMPO 5F (CATHETERS) ×4 IMPLANT
CATH OMNI FLUSH 5F 65CM (CATHETERS) ×8 IMPLANT
CHLORAPREP W/TINT 26 (MISCELLANEOUS) ×4 IMPLANT
CLIP VESOCCLUDE MED 24/CT (CLIP) ×4 IMPLANT
CLIP VESOCCLUDE SM WIDE 24/CT (CLIP) ×4 IMPLANT
COVER PROBE W GEL 5X96 (DRAPES) ×4 IMPLANT
COVER SURGICAL LIGHT HANDLE (MISCELLANEOUS) ×4 IMPLANT
COVER WAND RF STERILE (DRAPES) IMPLANT
DERMABOND ADVANCED (GAUZE/BANDAGES/DRESSINGS) ×2
DERMABOND ADVANCED .7 DNX12 (GAUZE/BANDAGES/DRESSINGS) ×6 IMPLANT
DRAIN CHANNEL 15F RND FF W/TCR (WOUND CARE) IMPLANT
DRAPE C-ARM 42X72 X-RAY (DRAPES) IMPLANT
ELECT REM PT RETURN 9FT ADLT (ELECTROSURGICAL) ×4
ELECTRODE REM PT RTRN 9FT ADLT (ELECTROSURGICAL) ×3 IMPLANT
EVACUATOR SILICONE 100CC (DRAIN) IMPLANT
GAUZE 4X4 16PLY RFD (DISPOSABLE) ×4 IMPLANT
GLIDEWIRE ADV .035X180CM (WIRE) ×4 IMPLANT
GLIDEWIRE ADV .035X260CM (WIRE) ×4 IMPLANT
GLOVE BIO SURGEON STRL SZ7.5 (GLOVE) ×4 IMPLANT
GLOVE SRG 8 PF TXTR STRL LF DI (GLOVE) ×3 IMPLANT
GLOVE SURG POLYISO LF SZ7.5 (GLOVE) ×4 IMPLANT
GLOVE SURG UNDER POLY LF SZ7.5 (GLOVE) ×4 IMPLANT
GLOVE SURG UNDER POLY LF SZ8 (GLOVE) ×1
GOWN STRL REUS W/ TWL LRG LVL3 (GOWN DISPOSABLE) ×12 IMPLANT
GOWN STRL REUS W/ TWL XL LVL3 (GOWN DISPOSABLE) ×12 IMPLANT
GOWN STRL REUS W/TWL LRG LVL3 (GOWN DISPOSABLE) ×4
GOWN STRL REUS W/TWL XL LVL3 (GOWN DISPOSABLE) ×4
GRAFT HEMASHIELD 8MM (Vascular Products) ×1 IMPLANT
GRAFT VASC STRG 30X8KNIT (Vascular Products) ×3 IMPLANT
HEMOSTAT SNOW SURGICEL 2X4 (HEMOSTASIS) ×8 IMPLANT
KIT BASIN OR (CUSTOM PROCEDURE TRAY) ×4 IMPLANT
KIT ENCORE 26 ADVANTAGE (KITS) IMPLANT
KIT MICROPUNCTURE NIT STIFF (SHEATH) ×4 IMPLANT
KIT TURNOVER KIT B (KITS) ×4 IMPLANT
NS IRRIG 1000ML POUR BTL (IV SOLUTION) ×8 IMPLANT
PACK ENDO MINOR (CUSTOM PROCEDURE TRAY) IMPLANT
PACK PERIPHERAL VASCULAR (CUSTOM PROCEDURE TRAY) ×4 IMPLANT
PAD ARMBOARD 7.5X6 YLW CONV (MISCELLANEOUS) ×8 IMPLANT
PATCH VASC XENOSURE 1CMX6CM (Vascular Products) ×2 IMPLANT
PATCH VASC XENOSURE 1X6 (Vascular Products) ×6 IMPLANT
SET MICROPUNCTURE 5F STIFF (MISCELLANEOUS) IMPLANT
SHEATH PINNACLE 5F 10CM (SHEATH) ×8 IMPLANT
SHEATH PINNACLE 8F 10CM (SHEATH) ×4 IMPLANT
SPONGE LAP 18X18 RF (DISPOSABLE) ×4 IMPLANT
STOPCOCK MORSE 400PSI 3WAY (MISCELLANEOUS) IMPLANT
SUT ETHILON 3 0 PS 1 (SUTURE) IMPLANT
SUT MNCRL AB 4-0 PS2 18 (SUTURE) ×8 IMPLANT
SUT PROLENE 5 0 C 1 24 (SUTURE) ×68 IMPLANT
SUT PROLENE 6 0 BV (SUTURE) ×8 IMPLANT
SUT VIC AB 2-0 CT1 27 (SUTURE) ×3
SUT VIC AB 2-0 CT1 TAPERPNT 27 (SUTURE) ×9 IMPLANT
SUT VIC AB 3-0 SH 27 (SUTURE) ×2
SUT VIC AB 3-0 SH 27X BRD (SUTURE) ×6 IMPLANT
SYR BULB IRRIG 60ML STRL (SYRINGE) ×4 IMPLANT
SYR MEDRAD MARK V 150ML (SYRINGE) ×4 IMPLANT
TOWEL GREEN STERILE (TOWEL DISPOSABLE) ×8 IMPLANT
TOWEL GREEN STERILE FF (TOWEL DISPOSABLE) ×4 IMPLANT
TRAY FOLEY MTR SLVR 16FR STAT (SET/KITS/TRAYS/PACK) ×4 IMPLANT
TUBING HIGH PRESSURE 120CM (CONNECTOR) ×4 IMPLANT
UNDERPAD 30X36 HEAVY ABSORB (UNDERPADS AND DIAPERS) ×4 IMPLANT
WATER STERILE IRR 1000ML POUR (IV SOLUTION) ×4 IMPLANT
WIRE AMPLATZ SS-J .035X180CM (WIRE) IMPLANT
WIRE AMPLATZ SS-J .035X260CM (WIRE) IMPLANT
WIRE BENTSON .035X145CM (WIRE) ×4 IMPLANT
WIRE STARTER BENTSON 035X150 (WIRE) ×4 IMPLANT
WIRE TORQFLEX AUST .018X40CM (WIRE) ×8 IMPLANT

## 2020-11-04 NOTE — Anesthesia Procedure Notes (Signed)
Arterial Line Insertion Start/End3/23/2022 7:05 AM Performed by: Murvin Natal, MD, Georgia Duff, CRNA  Patient location: Pre-op. Lidocaine 1% used for infiltration Left, radial was placed Catheter size: 20 G Hand hygiene performed   Attempts: 1 Procedure performed without using ultrasound guided technique. Following insertion, dressing applied and Biopatch. Post procedure assessment: normal  Patient tolerated the procedure well with no immediate complications.

## 2020-11-04 NOTE — Anesthesia Procedure Notes (Signed)
Procedure Name: Intubation Date/Time: 11/04/2020 7:00 AM Performed by: Georgia Duff, CRNA Pre-anesthesia Checklist: Patient identified, Emergency Drugs available, Suction available and Patient being monitored Patient Re-evaluated:Patient Re-evaluated prior to induction Oxygen Delivery Method: Circle System Utilized Preoxygenation: Pre-oxygenation with 100% oxygen Induction Type: IV induction Ventilation: Mask ventilation without difficulty Laryngoscope Size: Miller and 2 Grade View: Grade II Tube type: Oral Tube size: 8.0 mm Number of attempts: 1 Airway Equipment and Method: Stylet and Oral airway Placement Confirmation: ETT inserted through vocal cords under direct vision,  positive ETCO2 and breath sounds checked- equal and bilateral Secured at: 23 cm Tube secured with: Tape Dental Injury: Teeth and Oropharynx as per pre-operative assessment

## 2020-11-04 NOTE — Anesthesia Postprocedure Evaluation (Signed)
Anesthesia Post Note  Patient: Sean [name]  Procedure(s) Performed: LEFT COMMON FEMORAL ARTERY ENDARTERECTOMY (Left ) RIGHT TO LEFT FEMORAL-FEMORAL BYPASS (Bilateral ) ULTRASOUND GUIDANCE FOR VASCULAR ACCESS (Bilateral Groin) PATCH ANGIOPLASTY LEFT AND RIGHT COMMON FEMORAL ARTERY (Bilateral Groin)     Patient location during evaluation: PACU Anesthesia Type: General Level of consciousness: awake Pain management: pain level controlled Vital Signs Assessment: post-procedure vital signs reviewed and stable Respiratory status: spontaneous breathing, nonlabored ventilation, respiratory function stable and patient connected to nasal cannula oxygen Cardiovascular status: blood pressure returned to baseline and stable Postop Assessment: no apparent nausea or vomiting Anesthetic complications: no   No complications documented.  Last Vitals:  Vitals:   11/04/20 1800 11/04/20 1925  BP: (!) 154/79 132/74  Pulse: 80 70  Resp: 20 20  Temp:  36.9 C  SpO2: 97% 96%    Last Pain:  Vitals:   11/04/20 1925  TempSrc: Oral  PainSc: 1                  Jaquille Kau P Suede [place]

## 2020-11-04 NOTE — H&P (Signed)
HPI:  Sean [name] is a 70 y.o. male former smoker with risk factors for vascular disease hyperlipidemia hypertension.  He also has severe COPD.  He is limited in his walking to the shortness of breath as well as cramping in his calf that occurs at the very short distance 50-100 feet.  It does resolve after resting for a few minutes.  He does not have any tissue loss or ulceration.  He has never had lower extremity intervention.  Denies any history of stroke, TIA or amaurosis.  Does not have any personal or family history of abdominal aortic aneurysm.      Past Medical History:  Diagnosis Date  . Arthritis   . COPD (chronic obstructive pulmonary disease) ([place])   . Hyperlipidemia   . Hypertension         Family History  Problem Relation Age of Onset  . COPD Mother   . Diabetes Mother   . Hypertension Father   . COPD Father   . Heart disease Father   . Arthritis Sister         Past Surgical History:  Procedure Laterality Date  . BACK SURGERY    . EYE SURGERY    . HERNIA REPAIR    . WISDOM TOOTH EXTRACTION      Short Social History:  Social History        Tobacco Use  . Smoking status: Former Smoker    Types: Cigarettes    Quit date: 06/25/2013    Years since quitting: 7.2  . Smokeless tobacco: Former Systems developer    Quit date: 06/25/1976  Substance Use Topics  . Alcohol use: Yes    Alcohol/week: 30.0 standard drinks    Types: 30 Cans of beer per week         Allergies  Allergen Reactions  . Amlodipine     Other reaction(s): Edema of lower extremity          Current Outpatient Medications  Medication Sig Dispense Refill  . albuterol (PROVENTIL HFA;VENTOLIN HFA) 108 (90 BASE) MCG/ACT inhaler Inhale 2 puffs into the lungs every 6 (six) hours as needed for wheezing or shortness of breath.    Marland Kitchen albuterol (PROVENTIL) (2.5 MG/3ML) 0.083% nebulizer solution Take 2.5 mg by nebulization every 6 (six) hours as needed for  wheezing or shortness of breath.    Marland Kitchen amLODipine (NORVASC) 10 MG tablet Take 10 mg by mouth daily.    . benazepril (LOTENSIN) 10 MG tablet Take 10 mg by mouth daily. Take 1/2 daily    . cholecalciferol (VITAMIN D) 1000 UNITS tablet Take 1,000 Units by mouth 2 (two) times daily.    . cilostazol (PLETAL) 100 MG tablet TAKE ONE TABLET BY MOUTH TWICE A DAY *START WITH 50MG  (1/2 TABLET) TWICE A DAY FOR 1 WEEK THEN ADVANCE TO FULL DOSE IF NO SIDE EFFECTS*    . folic acid (FOLVITE) 1 MG tablet Take 1 tablet by mouth daily.    Marland Kitchen gabapentin (NEURONTIN) 300 MG capsule TAKE TWO CAPSULES BY MOUTH THREE TIMES A DAY FOR NERVE PAIN    . hydrochlorothiazide (HYDRODIURIL) 25 MG tablet TAKE ONE TABLET BY MOUTH DAILY FOR SWELLING    . hydrocortisone 2.5 % cream Apply topically 3 (three) times daily. rectally    . Magnesium Oxide 420 MG TABS Take 1 tablet by mouth daily.    . meloxicam (MOBIC) 15 MG tablet Take 15 mg by mouth daily.    . metoprolol (TOPROL-XL) 200 MG  24 hr tablet Take 200 mg by mouth daily.    . multivitamin-lutein (OCUVITE-LUTEIN) CAPS capsule Take 1 capsule by mouth daily.    . niacin 100 MG tablet Take 100 mg by mouth at bedtime.    . ondansetron (ZOFRAN) 8 MG tablet Take by mouth every 8 (eight) hours as needed for nausea or vomiting.    . pantoprazole (PROTONIX) 40 MG tablet Take 40 mg by mouth 2 (two) times daily.    . rosuvastatin (CRESTOR) 20 MG tablet TAKE ONE-HALF TABLET BY MOUTH ONCE A DAY FOR CHOLESTEROL    . thiamine 100 MG tablet Take 100 mg by mouth daily.    Marland Kitchen tiotropium (SPIRIVA) 18 MCG inhalation capsule Place 18 mcg into inhaler and inhale daily.    . urea (CARMOL) 20 % cream Apply topically 3 (three) times daily.     No current facility-administered medications for this visit.    Review of Systems  Constitutional:  Constitutional negative. HENT: HENT negative.  Eyes: Eyes negative.  Respiratory: Positive for shortness of breath  and wheezing.  Cardiovascular: Positive for claudication.  GI: Gastrointestinal negative.  Musculoskeletal: Musculoskeletal negative.  Skin: Skin negative.  Neurological: Neurological negative. Hematologic: Hematologic/lymphatic negative.  Psychiatric: Psychiatric negative.        Objective:   Vitals:   11/04/20 0544  BP: (!) 187/96  Pulse: 88  Resp: 20  Temp: 98.6 F (37 C)  SpO2: 93%     Physical Exam HENT:     Head: Normocephalic.     Nose:     Comments: Wearing a mask Eyes:     Extraocular Movements: Extraocular movements intact.     Pupils: Pupils are equal, round, and reactive to light.  Neck:     Vascular: No carotid bruit.  Cardiovascular:     Rate and Rhythm: Normal rate.  Pulmonary:     Effort: Pulmonary effort is normal.     Breath sounds: Wheezing present.  Abdominal:     General: Abdomen is flat. There is distension.     Palpations: Abdomen is soft.  Musculoskeletal:        General: Normal range of motion.     Right lower leg: Edema present.     Left lower leg: No edema.  Skin:    General: Skin is warm and dry.     Capillary Refill: Capillary refill takes 2 to 3 seconds.  Neurological:     General: No focal deficit present.     Mental Status: He is alert.  Psychiatric:        Mood and Affect: Mood normal.        Behavior: Behavior normal.        Thought Content: Thought content normal.        Judgment: Judgment normal.     Data: ABI Findings:  +---------+------------------+-----+--------+--------+  Right  Rt Pressure (mmHg)IndexWaveformComment   +---------+------------------+-----+--------+--------+  Brachial 169                     +---------+------------------+-----+--------+--------+  PTA   143        0.84 biphasic      +---------+------------------+-----+--------+--------+  DP    115        0.67 biphasic       +---------+------------------+-----+--------+--------+  Great Toe99        0.58           +---------+------------------+-----+--------+--------+   +---------+------------------+-----+----------+-------+  Left   Lt Pressure (mmHg)IndexWaveform Comment  +---------+------------------+-----+----------+-------+  Brachial 171                      +---------+------------------+-----+----------+-------+  PTA   104        0.61 monophasic      +---------+------------------+-----+----------+-------+  DP    110        0.64 monophasic      +---------+------------------+-----+----------+-------+  Great Toe67        0.39            +---------+------------------+-----+----------+-------+   +-------+-----------+-----------+------------+------------+  ABI/TBIToday's ABIToday's TBIPrevious ABIPrevious TBI  +-------+-----------+-----------+------------+------------+  Right 0.84    0.58                  +-------+-----------+-----------+------------+------------+  Left  0.64    0.39                  +-------+-----------+-----------+------------+------------+       Assessment/Plan:    70 year old male with 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  He actually appears to have early ischemic changes despite a toe pressure of 67.  He is no longer a smoker.  He states that this claudication is extremely life limiting.  He has undergone angiography and results reviewed with him. Plan for left CFEA with retrograde stent and possible right to left fem-fem bypass graft.       Waynetta Sandy MD [facility]

## 2020-11-04 NOTE — Op Note (Signed)
Patient name: Sean [name] MRN: [id number] DOB: 1950-11-05 Sex: male  11/04/2020 Pre-operative Diagnosis: left lower extremity rest pain Post-operative diagnosis:  Same Surgeon:  Erlene Quan C. Donzetta Matters, MD Assistants: Fortunato Curling, MD; Laurence Slate, PA Procedure Performed: 1.  Percutaneous access Right common femoral artery with ultrasound guidance 2.  Aortogram 3.  Bilateral common femoral endarterectomy with bovine pericardial patch angioplasty 4.  Right to left femoral to femoral artery bypass with 8 mm Dacron   Indications: 70 year old male with short distance claudication borders on left lower extremity rest pain.  He has undergone aortogram he is now indicated for comfortable endarterectomy on the left with retrograde stent versus right to left femoral to femoral bypass.  Assistants were necessary due to the bilateral and extensive nature of this case and as well as to expedite the case peer  Findings: Bilateral common femoral arteries were heavily calcified.  I cannot cross the common or external iliac arteries on the left with wire from either direction either antegrade or retrograde.  After endarterectomy on the left I still had no inflow.  The right side there was pulsatility and there was a softer in the external leg artery under the inguinal ligament we did perform significant endarterectomy there.  On both sides we had good backbleeding from our profunda and adequate backbleeding from the SFA.  8 mm Dacron was used to bypass from the right common femoral artery patch to the left common femoral artery patch.   Procedure:  The patient was identified in the holding area and taken to the operating was placed supine operative when general anesthesia was induced.  He was sterilely prepped and draped in the bilateral groins in the usual fashion, antibiotics were ministered and a timeout was called.  Ultrasound was used to identify the left common femoral artery.  A transverse incision was made over  this.  We dissected down through the skin subcutaneous tissue to the common femoral artery which was heavily calcified there was no pulse.  We dissected up and go to the inguinal ligament.  I did have to divide 2 branches to obtain better exposure there was actually a softer area for clamping we placed a vessel loop around this and the external iliac artery.  We dissected out the profunda which is actually a high bifurcation the SFA placed Vesseloops around these.  Patient at this time was fully heparinized.  I then cannulated the common femoral artery on the left with micropuncture needle followed by wire and sheath.  I placed a Bentson wire followed by 5 [place] sheath.  I used Glidewire and Glidewire advantage as well as bare catheter to attempt to cross the external and common iliac arteries retrograde but could not.  With that I then used ultrasound guidance to cannulate the right common femoral artery.  This vessel was noted to be heavily calcified.  We placed a micropuncture needle followed by wire and a sheath followed a Bentson wire and 5 [place] sheath.  We placed the catheter into the aorta performed aortogram.  I attempted antegrade to cross the common iliac artery with both a Omni catheter and a crossover catheter but could not.  With this I elected to perform endarterectomy on the left.  The outflow vessels followed by the external leg artery were clamped.  I opened the vessel longitudinally.  I performed extensive left common femoral endarterectomy including down into the SFA and the profunda and establish good backbleeding from both and up the way into the  external iliac artery but still had very little trickle antegrade bleeding.  I then performed patch angioplasty with a bovine pericardial patch.  I then cannulated the patch with a micropuncture needle followed by wire sheath placed a 5 French sheath attempted again to use wire and catheter to go retrograde but could not.  We decided then elected to  cut down on the right common femoral artery.  Transverse incision was made.  We traced the sheath down the level the common femoral artery.  The sheath was removed and a 5-0 Prolene suture was placed.  We dissected around the inguinal ligament divided the crossing vein which was also divided on the contralateral side.  We placed a vessel loop around the external leg artery there was one soft area for clamping.  We dissected out the profunda and the SFA and placed Vesseloops around these as well.  Patient had been reheparinized a few times throughout this case.  We then clamped our inflow followed by inflow performed a longitudinal arteriotomy.  We performed extensive endarterectomy including down to the SFA and profunda until we had good backbleeding best from the profunda but adequate from the SFA.  We had very strong inflow from the external.  We then sewed a bovine pericardial patch in place with 5-0 Prolene suture.  Prior completion we allowed flushing all directions and then released our clamps we had good pulsatility there.  We then told 8 mm Dacron.  All vessels were reclamped.  Concomitantly the left and right were sewn with the help of Dr. Carlis Abbott.  These were sewn with 5-0 Prolene suture to the common femoral arteries.  We de-aired everything prior to completion.  Upon completion we had very good signals in our profunda and SFA bilaterally.  50 mg of protamine was administered we thoroughly irrigated all the wounds in obtain hemostasis.  We then closed in layers of Vicryl and Monocryl.  Dermabond is placed at both sites.  He was awakened from anesthesia having tolerated procedure without any complication.  All counts were correct at completion.  EBL: 500cc   Wednesday Ericsson C. Donzetta Matters, MD [facility] [place] Office: [phone] Pager: [phone]

## 2020-11-04 NOTE — Progress Notes (Signed)
Patient brought to 4E from PACU. Telemetry box applied, CCMD notified. Patient stated 1/10 pain scale. Patient oriented to staff and room. Call bell in reach.  Daymon Larsen, RN

## 2020-11-04 NOTE — Transfer of Care (Signed)
Immediate Anesthesia Transfer of Care Note  Patient: Sean [name]  Procedure(s) Performed: LEFT COMMON FEMORAL ARTERY ENDARTERECTOMY (Left ) RIGHT TO LEFT FEMORAL-FEMORAL BYPASS (Bilateral ) ULTRASOUND GUIDANCE FOR VASCULAR ACCESS (Bilateral Groin) PATCH ANGIOPLASTY LEFT AND RIGHT COMMON FEMORAL ARTERY (Bilateral Groin)  Patient Location: PACU  Anesthesia Type:General  Level of Consciousness: awake and alert   Airway & Oxygen Therapy: Patient Spontanous Breathing and Patient connected to nasal cannula oxygen  Post-op Assessment: Report given to RN and Post -op Vital signs reviewed and stable  Post vital signs: Reviewed and stable  Last Vitals:  Vitals Value Taken Time  BP 122/72 11/04/20 1201  Temp    Pulse 77 11/04/20 1201  Resp 27 11/04/20 1201  SpO2 91 % 11/04/20 1201  Vitals shown include unvalidated device data.  Last Pain:  Vitals:   11/04/20 0556  TempSrc:   PainSc: 0-No pain         Complications: No complications documented.

## 2020-11-04 NOTE — Progress Notes (Signed)
   S/P femoral end B and fem-fem bypass Doppler signals DP B LE Groins soft without hematoma  Roxy Horseman PA-C

## 2020-11-05 ENCOUNTER — Encounter (HOSPITAL_COMMUNITY): Payer: Self-pay | Admitting: Vascular Surgery

## 2020-11-05 LAB — BASIC METABOLIC PANEL
Anion gap: 6 (ref 5–15)
BUN: 9 mg/dL (ref 8–23)
CO2: 27 mmol/L (ref 22–32)
Calcium: 8.1 mg/dL — ABNORMAL LOW (ref 8.9–10.3)
Chloride: 99 mmol/L (ref 98–111)
Creatinine, Ser: 0.76 mg/dL (ref 0.61–1.24)
GFR, Estimated: >60 mL/min (ref >60)
Glucose, Bld: 112 mg/dL — ABNORMAL HIGH (ref 70–99)
Potassium: 4 mmol/L (ref 3.5–5.1)
Sodium: 132 mmol/L — ABNORMAL LOW (ref 135–145)

## 2020-11-05 LAB — LIPID PANEL
Cholesterol: 84 mg/dL (ref 0–200)
HDL: 53 mg/dL (ref >40)
LDL Cholesterol: 14 mg/dL (ref 0–99)
Total CHOL/HDL Ratio: 1.6 RATIO
Triglycerides: 83 mg/dL (ref <150)
VLDL: 17 mg/dL (ref 0–40)

## 2020-11-05 LAB — CBC
HCT: 26.6 % — ABNORMAL LOW (ref 39.0–52.0)
Hemoglobin: 9.4 g/dL — ABNORMAL LOW (ref 13.0–17.0)
MCH: 36.7 pg — ABNORMAL HIGH (ref 26.0–34.0)
MCHC: 35.3 g/dL (ref 30.0–36.0)
MCV: 103.9 fL — ABNORMAL HIGH (ref 80.0–100.0)
Platelets: 193 10*3/uL (ref 150–400)
RBC: 2.56 MIL/uL — ABNORMAL LOW (ref 4.22–5.81)
RDW: 13.6 % (ref 11.5–15.5)
WBC: 11.7 10*3/uL — ABNORMAL HIGH (ref 4.0–10.5)
nRBC: 0 % (ref 0.0–0.2)

## 2020-11-05 NOTE — Progress Notes (Addendum)
[facility]  Subjective  - Didn't sleep much.   Objective (!) 142/81 82 98.4 F (36.9 C) (Oral) 20 97%  Intake/Output Summary (Last 24 hours) at 11/05/2020 0806 Last data filed at 11/05/2020 0700 Gross per 24 hour  Intake 4152.53 ml  Output 1200 ml  Net 2952.53 ml    B Groins soft, dry 4 x 4 placed over incisions Doppler DP intact right LE Groin soft without hematoma Lungs non labored breathing Heart RRR  Assessment/Planning: POD # 60femoral end B and fem-fem bypass  Good inflow Doppler signals DP B LE Mobility, pain control and voiding are the goals.  Sean [name] 11/05/2020 8:06 AM --  Laboratory Lab Results: Recent Labs    11/04/20 1645 11/05/20 0400  WBC 16.3* 11.7*  HGB 10.7* 9.4*  HCT 31.5* 26.6*  PLT 198 193   BMET Recent Labs    11/04/20 1645 11/05/20 0400  NA 133* 132*  K 4.1 4.0  CL 100 99  CO2 26 27  GLUCOSE 150* 112*  BUN 9 9  CREATININE 0.82 0.76  CALCIUM 8.5* 8.1*    COAG Lab Results  Component Value Date   INR 1.0 10/30/2020   No results found for: PTT  I have independently interviewed and examined patient and agree with PA assessment and plan above.  Signals much improved at bilateral ankles with very strong peroneal signal he also has dorsalis pedis signals bilaterally at his feet.  Wounds are satisfactory we will keep a dry dressing there.  Out of bed today.  Will evaluate with physical therapy.  Suhas Estis C. Donzetta Matters, MD [facility] Office: [phone] Pager: [phone]

## 2020-11-05 NOTE — Progress Notes (Signed)
SATURATION QUALIFICATIONS: (This note is used to comply with regulatory documentation for home oxygen)  Patient Saturations on Room Air at Rest = 93%  Patient Saturations on Room Air while Ambulating = 87%  Patient Saturations on 2 Liters of oxygen while Ambulating = 92%  Please briefly explain why patient needs home oxygen:  On RA, pt's SpO2 consistently dropped to around 86-88% with associated dyspnea.  With supplemental O2 at 1L, sats 89% and on 2L [place] sats during gait rose to 91/92%, though still with a noticeable level of dyspnea.  If pt is to d/c today, should have supplemental O2 available.  11/05/2020  Ginger Carne., PT Acute Rehabilitation Services [phone]  (pager) [phone]  (office)

## 2020-11-05 NOTE — Progress Notes (Signed)
Pt has home CPAP in bag in room, but does not want set up at this time. Pt states he feels like he is doing good on just O2. Advised pt to have RN notify for RT if he changes his mind.

## 2020-11-05 NOTE — Progress Notes (Signed)
Mobility Specialist: Progress Note   11/05/20 1532  Mobility  Activity Ambulated in hall  Level of Assistance Modified independent, requires aide device or extra time  Assistive Device Front wheel walker  Distance Ambulated (ft) 360 ft  Mobility Response Tolerated well  Mobility performed by Mobility specialist  Bed Position Chair  $Mobility charge 1 Mobility   During-Mobility: 96% SpO2 Post-Mobility: 92 HR, 127/74 BP, 97% SpO2  Pt ambulated on 2 L/min [place]. Pt audibly SOB after returning to room. Pt otherwise asx during ambulation. Pt back to chair after walk with call bell in reach.   [facility] Day Mobility Specialist Mobility Specialist Phone: [phone]

## 2020-11-05 NOTE — Evaluation (Signed)
Physical Therapy Evaluation Patient Details Name: Sean [name] MRN: [id number]  DOB: 08/09/1951 Today's Date: 11/05/2020   History of Present Illness  Pt is a 70 year old man admitted for R to L fem-fem BPG. PMH: former smoker, COPD, HLD, HTN, arthritis.  Clinical Impression  Pt admitted with/for R to L fem-fem BPG.  Pt mobilizes at a supervision or better level.  His main problem lies with his less than optimal pulmonary status.  On RA, pt's SpO2 with gait falls to <=88%..  Pt currently limited functionally due to the problems listed below.  (see problems list.)  Pt will benefit from PT to maximize function and safety to be able to get home safely with available assist .     Follow Up Recommendations No PT follow up    Equipment Recommendations  None recommended by PT    Recommendations for Other Services       Precautions / Restrictions Precautions Precautions: Fall Precaution Comments: watch 02      Mobility  Bed Mobility               General bed mobility comments: pt up in the recliner on arrival    Transfers Overall transfer level: Needs assistance Equipment used: Rolling walker (2 wheeled) Transfers: Sit to/from Stand Sit to Stand: Supervision         General transfer comment: cues for hand placement  Ambulation/Gait Ambulation/Gait assistance: Supervision Gait Distance (Feet): 300 Feet Assistive device: Rolling walker (2 wheeled) Gait Pattern/deviations: Step-through pattern   Gait velocity interpretation: 1.31 - 2.62 ft/sec, indicative of limited community ambulator General Gait Details: steady, no signs of overt pain, moderate speed, sats on RA dropped consistently at or below 88%.  On 1 L sats up to 89% and 2L [place] sats rose to 91/92%.  Pt 2/4 dyspnea.  Stairs Stairs: Yes   Stair Management: One rail Left;Step to pattern;Forwards Number of Stairs: 4 General stair comments: safe with rail, noticeable dyspnea.  Wheelchair Mobility    Modified  [place] (Stroke Patients Only)       Balance Overall balance assessment: Needs assistance   Sitting balance-Leahy Scale: Good       Standing balance-Leahy Scale: Fair Standing balance comment: can release walker in static standing                             Pertinent Vitals/Pain Pain Assessment: Faces Pain Location: 1 Pain Descriptors / Indicators: Burning Pain Intervention(s): Monitored during session    Home Living Family/patient expects to be discharged to:: Private residence Living Arrangements: Alone Available Help at Discharge: Family;Friend(s);Available PRN/intermittently Type of Home: House Home Access: Stairs to enter Entrance Stairs-Rails: Psychiatric nurse of Steps: 3 Home Layout: One level Home Equipment: Bedside commode;Walker - 4 wheels;Cane - single point;Shower seat;Grab bars - tub/shower      Prior Function Level of Independence: Independent               Hand Dominance   Dominant Hand: Right    Extremity/Trunk Assessment   Upper Extremity Assessment Upper Extremity Assessment: Overall WFL for tasks assessed    Lower Extremity Assessment Lower Extremity Assessment: Overall WFL for tasks assessed    Cervical / Trunk Assessment Cervical / Trunk Assessment: Other exceptions Cervical / Trunk Exceptions: obesity, hx of chronic back pain  Communication   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)  Cognition Arousal/Alertness: Awake/alert Behavior During Therapy: WFL for tasks assessed/performed Overall Cognitive  Status: Within Functional Limits for tasks assessed                                        General Comments      Exercises     Assessment/Plan    PT Assessment Patient needs continued PT services  PT Problem List Decreased activity tolerance;Decreased mobility;Pain;Cardiopulmonary status limiting activity       PT Treatment Interventions Gait training;Functional mobility  training;Therapeutic activities;Patient/family education    PT Goals (Current goals can be found in the Care Plan section)  Acute Rehab PT Goals Patient Stated Goal: return home PT Goal Formulation: With patient Time For Goal Achievement: 11/12/20 Potential to Achieve Goals: Good    Frequency Min 3X/week   Barriers to discharge        Co-evaluation               AM-PAC PT "6 Clicks" Mobility  Outcome Measure Help needed turning from your back to your side while in a flat bed without using bedrails?: None Help needed moving from lying on your back to sitting on the side of a flat bed without using bedrails?: None Help needed moving to and from a bed to a chair (including a wheelchair)?: None Help needed standing up from a chair using your arms (e.g., wheelchair or bedside chair)?: None Help needed to walk in hospital room?: A Little Help needed climbing 3-5 steps with a railing? : A Little 6 Click Score: 22    End of Session Equipment Utilized During Treatment: Oxygen Activity Tolerance: Patient tolerated treatment well Patient left: in chair;with call bell/phone within reach Nurse Communication: Mobility status PT Visit Diagnosis: Difficulty in walking, not elsewhere classified (R26.2)    Time: 0092-3300 PT Time Calculation (min) (ACUTE ONLY): 20 min   Charges:   PT Evaluation $PT Eval Low Complexity: 1 Low          11/05/2020  Ginger Carne., PT Acute Rehabilitation Services [phone]  (pager) [phone]  (office)  Tessie Fass Jennine Peddy 11/05/2020, 1:50 PM

## 2020-11-05 NOTE — Progress Notes (Signed)
PHARMACIST LIPID MONITORING   Sean [name] is a 70 y.o. male admitted on 11/04/2020 needing fem-fem bypass.  Pharmacy has been consulted to optimize lipid-lowering therapy with the indication of secondary prevention for clinical ASCVD.  Recent Labs:  Lipid Panel (last 6 months):   Lab Results  Component Value Date   CHOL 84 11/05/2020   TRIG 83 11/05/2020   HDL 53 11/05/2020   CHOLHDL 1.6 11/05/2020   VLDL 17 11/05/2020   LDLCALC 14 11/05/2020    Hepatic function panel (last 6 months):   Lab Results  Component Value Date   AST 34 10/30/2020   ALT 32 10/30/2020   ALKPHOS 60 10/30/2020   BILITOT 0.9 10/30/2020    SCr (since admission):   Serum creatinine: 0.76 mg/dL 11/05/20 0400 Estimated creatinine clearance: 96.8 mL/min  Current therapy and lipid therapy tolerance Current lipid-lowering therapy: rosuvastatin 20mg   Previous lipid-lowering therapies (if applicable): n/a Documented or reported allergies or intolerances to lipid-lowering therapies (if applicable): none  Assessment:   Patient prefers no changes in lipid-lowering therapy at this time due to good lipid control on current therapy.   Plan:    1.Statin intensity (high intensity recommended for all patients regardless of the LDL):  No statin changes. The patient is already on a high intensity statin.  2.Add ezetimibe (if any one of the following):   Not indicated at this time.  3.Refer to lipid clinic:   No  4.Follow-up with:  Primary care provider - [facility] [place]  5.Follow-up labs after discharge:  No changes in lipid therapy, repeat a lipid panel in one year.      Erin Hearing PharmD., BCPS Clinical Pharmacist 11/05/2020 11:32 AM

## 2020-11-05 NOTE — Evaluation (Signed)
Occupational Therapy Evaluation Patient Details Name: Sean [name] MRN: [id number] DOB: Mar 12, 1951 Today's Date: 11/05/2020    History of Present Illness Pt is a 70 year old man admitted for R to L fem-fem BPG. PMH: former smoker, COPD, HLD, HTN, arthritis.   Clinical Impression   Pt was living alone and functioning independently prior to admission. Pt c/o minimal pain. Ambulated with supervision and RW. Struggles with LB bathing and dressing and is interested in education in use of AE. Sp02 dropped to 88% on RA with short distance ambulation, rebounded to 96% on 2L 02. Will follow acutely. Do not anticipate pt will require post acute OT.     Follow Up Recommendations  No OT follow up    Equipment Recommendations  None recommended by OT    Recommendations for Other Services       Precautions / Restrictions Precautions Precautions: Fall Precaution Comments: watch 02      Mobility Bed Mobility Overal bed mobility: Modified Independent             General bed mobility comments: HOB up    Transfers Overall transfer level: Needs assistance Equipment used: Rolling walker (2 wheeled) Transfers: Sit to/from Stand Sit to Stand: Supervision         General transfer comment: cues for hand placement    Balance Overall balance assessment: Needs assistance   Sitting balance-Leahy Scale: Good       Standing balance-Leahy Scale: Fair Standing balance comment: can release walker in static standing                           ADL either performed or assessed with clinical judgement   ADL Overall ADL's : Needs assistance/impaired Eating/Feeding: Independent   Grooming: Supervision/safety;Standing   Upper Body Bathing: Set up;Sitting   Lower Body Bathing: Minimal assistance;Sit to/from stand   Upper Body Dressing : Set up;Sitting   Lower Body Dressing: Minimal assistance;Sit to/from stand   Toilet Transfer: Supervision/safety;Ambulation;RW Toilet  Transfer Details (indicate cue type and reason): recommended pt use his BSC over his toilet Toileting- Clothing Manipulation and Hygiene: Supervision/safety       Functional mobility during ADLs: Supervision/safety;Rolling walker       Vision Patient Visual Report: No change from baseline       Perception     Praxis      Pertinent Vitals/Pain Pain Assessment: 0-10 Pain Location: 1 Pain Descriptors / Indicators: Burning Pain Intervention(s): Monitored during session     Hand Dominance Right   Extremity/Trunk Assessment Upper Extremity Assessment Upper Extremity Assessment: Overall WFL for tasks assessed       Cervical / Trunk Assessment Cervical / Trunk Assessment: Other exceptions Cervical / Trunk Exceptions: obesity, hx of chronic back pain   Communication Communication 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)   Cognition Arousal/Alertness: Awake/alert Behavior During Therapy: WFL for tasks assessed/performed Overall Cognitive Status: Within Functional Limits for tasks assessed                                     General Comments       Exercises     Shoulder Instructions      Home Living Family/patient expects to be discharged to:: Private residence Living Arrangements: Alone Available Help at Discharge: Family;Friend(s);Available PRN/intermittently Type of Home: House Home Access: Stairs to enter Entrance Stairs-Number of Steps: 3 Entrance Stairs-Rails: Right;Left  Home Layout: One level     Bathroom Shower/Tub: Occupational psychologist: Standard     Home Equipment: Bedside commode;Walker - 4 wheels;Cane - single point;Shower seat;Grab bars - tub/shower          Prior Functioning/Environment Level of Independence: Independent                 OT Problem List: Impaired balance (sitting and/or standing);Decreased activity tolerance;Decreased knowledge of use of DME or AE;Pain;Obesity      OT  Treatment/Interventions: Self-care/ADL training;DME and/or AE instruction;Patient/family education;Balance training;Therapeutic activities    OT Goals(Current goals can be found in the care plan section) Acute Rehab OT Goals Patient Stated Goal: return home OT Goal Formulation: With patient Time For Goal Achievement: 11/19/20 Potential to Achieve Goals: Good ADL Goals Pt Will Perform Lower Body Bathing: with modified independence;with adaptive equipment;sit to/from stand Pt Will Perform Lower Body Dressing: with modified independence;with adaptive equipment;sit to/from stand Pt Will Perform Tub/Shower Transfer: with modified independence;ambulating;shower seat;Shower transfer;rolling walker  OT Frequency: Min 2X/week   Barriers to D/C:            Co-evaluation              AM-PAC OT "6 Clicks" Daily Activity     Outcome Measure Help from another person eating meals?: None Help from another person taking care of personal grooming?: A Little Help from another person toileting, which includes using toliet, bedpan, or urinal?: A Little Help from another person bathing (including washing, rinsing, drying)?: A Little Help from another person to put on and taking off regular upper body clothing?: None Help from another person to put on and taking off regular lower body clothing?: A Little 6 Click Score: 20   End of Session Equipment Utilized During Treatment: Oxygen;Rolling walker  Activity Tolerance: Patient tolerated treatment well Patient left: in chair;with call bell/phone within reach  OT Visit Diagnosis: Unsteadiness on feet (R26.81);Other abnormalities of gait and mobility (R26.89);Pain;Other (comment) (decreased activity tolerance)                Time: 4481-8563 OT Time Calculation (min): 23 min Charges:  OT General Charges $OT Visit: 1 Visit OT Evaluation $OT Eval Moderate Complexity: 1 Mod OT Treatments $Self Care/Home Management : 8-22 mins  Nestor Lewandowsky,  OTR/L Acute Rehabilitation Services Pager: [phone] Office: [phone]  Malka So 11/05/2020, 9:31 AM

## 2020-11-06 ENCOUNTER — Inpatient Hospital Stay (HOSPITAL_COMMUNITY): Payer: No Typology Code available for payment source

## 2020-11-06 MED ORDER — FUROSEMIDE 10 MG/ML IJ SOLN
60.0000 mg | Freq: Once | INTRAMUSCULAR | Status: AC
  Administered 2020-11-06: 60 mg via INTRAVENOUS
  Filled 2020-11-06: qty 6

## 2020-11-06 NOTE — Discharge Instructions (Signed)
dsicharge  [facility]  Discharge instructions  Lower Extremity Bypass Surgery  Please refer to the following instruction for your post-procedure care. Your surgeon or physician assistant will discuss any changes with you.  Activity  You are encouraged to walk as much as you can. You can slowly return to normal activities during the month after your surgery. Avoid strenuous activity and heavy lifting until your doctor tells you it's OK. Avoid activities such as vacuuming or swinging a golf club. Do not drive until your doctor give the OK and you are no longer taking prescription pain medications. It is also normal to have difficulty with sleep habits, eating and bowel movement after surgery. These will go away with time.  Bathing/Showering  Shower daily after you go home. Do not soak in a bathtub, hot tub, or swim until the incision heals completely.  Incision Care  Clean your incision with mild soap and water. Shower every day. Pat the area dry with a clean towel. You do not need a bandage unless otherwise instructed. Do not apply any ointments or creams to your incision. If you have open wounds you will be instructed how to care for them or a visiting nurse may be arranged for you. If you have staples or sutures along your incision they will be removed at your post-op appointment. You may have skin glue on your incision. Do not peel it off. It will come off on its own in about one week.  Wash the groin incisions with soap and water daily and pat dry. (No tub bath-only shower)  Then put a dry gauze or washcloth in the groin to keep this area dry to help prevent wound infection.  Do this daily and as needed.  Do not use Vaseline or neosporin on your incisions.  Only use soap and water on your incisions and then protect and keep dry.  Diet  Resume your normal diet. There are no special food restrictions following this procedure. A low fat/ low cholesterol diet  is recommended for all patients with vascular disease. In order to heal from your surgery, it is CRITICAL to get adequate nutrition. Your body requires vitamins, minerals, and protein. Vegetables are the best source of vitamins and minerals. Vegetables also provide the perfect balance of protein. Processed food has little nutritional value, so try to avoid this.  Medications  Resume taking all your medications unless your doctor or physician assistant tells you not to. If your incision is causing pain, you may take over-the-counter pain relievers such as acetaminophen (Tylenol). If you were prescribed a stronger pain medication, please aware these medication can cause nausea and constipation. Prevent nausea by taking the medication with a snack or meal. Avoid constipation by drinking plenty of fluids and eating foods with high amount of fiber, such as fruits, vegetables, and grains. Take Colace 100 mg (an over-the-counter stool softener) twice a day as needed for constipation.   Do not take Tylenol if you are taking prescription pain medications.  Follow Up  Our office will schedule a follow up appointment 2-3 weeks following discharge.  Please call us immediately for any of the following conditions  .Severe or worsening pain in your legs or feet while at rest or while walking .Increase pain, redness, warmth, or drainage (pus) from your incision site(s) . Fever of 101 degree or higher . The swelling in your leg with the bypass suddenly worsens and becomes more painful than when you were in the  hospital . If you have been instructed to feel your graft pulse then you should do so every day. If you can no longer feel this pulse, call the office immediately. Not all patients are given this instruction. .  Leg swelling is common after leg bypass surgery.  The swelling should improve over a few months following surgery. To improve the swelling, you may elevate your legs above the level of your heart  while you are sitting or resting. Your surgeon or physician assistant may ask you to apply an ACE wrap or wear compression (TED) stockings to help to reduce swelling.  Reduce your risk of vascular disease  Stop smoking. If you would like help call [facility] at 1-800-QUIT-NOW 628-063-2845) or [place] at 845-205-3968.  . Manage your cholesterol . Maintain a desired weight . Control your diabetes weight . Control your diabetes . Keep your blood pressure down .  If you have any questions, please call the office at [phone]

## 2020-11-06 NOTE — Progress Notes (Signed)
Physical Therapy Treatment Patient Details Name: Sean [name] MRN: [id number] DOB: Dec 22, 1950 Today's Date: 11/06/2020    History of Present Illness Pt is a 70 year old man admitted for R to L fem-fem BPG. Increased O2 needs post-op (was not on O2 at home prior). PMH: former smoker, COPD, HLD, HTN, arthritis.    PT Comments    Pt received in recliner, pleasantly participatory and with good tolerance for gait trial without assistive device. Pt notably dyspneic with exertion and with significant scrotal edema, RN notified and pt instructed on techniques/positioning for edema reduction while in bed/chair. Pt performed gait trial on 2L O2 [place] with no AD and mostly Supervision but did need some cues for activity pacing/frequent standing breaks and slowed breaths due DOE 2-3/4. Pt continues to benefit from PT services to progress toward functional mobility goals.   Follow Up Recommendations  No PT follow up     Equipment Recommendations  None recommended by PT    Recommendations for Other Services       Precautions / Restrictions Precautions Precautions: Fall Precaution Comments: watch 02, significant scrotal edema 3/25 Restrictions Weight Bearing Restrictions: No    Mobility  Bed Mobility Overal bed mobility: Modified Independent             General bed mobility comments: pt up in the recliner on arrival    Transfers Overall transfer level: Needs assistance Equipment used: Rolling walker (2 wheeled) Transfers: Sit to/from Stand Sit to Stand: Supervision         General transfer comment: cues for hand placement; increased time to perform 2/2 edema/increased WOB at rest  Ambulation/Gait Ambulation/Gait assistance: Min guard;Supervision Gait Distance (Feet): 375 Feet Assistive device: None Gait Pattern/deviations: Step-through pattern;Wide base of support Gait velocity: grossly 0.2-0.3 m/s Gait velocity interpretation: <1.31 ft/sec, indicative of household  ambulator General Gait Details: steady, no signs of overt pain, slow speed due to increased work of breathing, sats on 2L O2 [place] 90-95%, at times poor pleth reading and new sensor placed on ear prior to trial (was not reading on hand). RR 20 rpm at rest and increased to 30's rpm during gait; x4 standing rest breaks with cues for pursed lip breathing to achieve this distance; no LOB   Marine scientist [place] (Stroke Patients Only)       Balance Overall balance assessment: Needs assistance Sitting-balance support: No upper extremity supported Sitting balance-Leahy Scale: Good     Standing balance support: No upper extremity supported Standing balance-Leahy Scale: Good Standing balance comment: unable to perform full tandem but did perform modified semi-tandem stance (due to scrotal swelling unable to place feet adjacent) with EO/EC no LOB without UE support                            Cognition Arousal/Alertness: Awake/alert Behavior During Therapy: WFL for tasks assessed/performed Overall Cognitive Status: Within Functional Limits for tasks assessed                                 General Comments: pleasantly cooperative      Exercises General Exercises - Lower Extremity Ankle Circles/Pumps: AROM;Both;10 reps;Seated Long Arc Quad: AROM;Both;10 reps;Seated    General Comments General comments (skin integrity, edema, etc.): significant scrotal edema, RN notified and per pt he did  tell MD, pt given folded pillowcase placed underneath as a sling to elevate and washcloths folded underneath to keep elevated in recliner; heels floated with pillow under BLE      Pertinent Vitals/Pain Pain Assessment: Faces Faces Pain Scale: Hurts a little bit Pain Location: scrotal discomfort 2/2 edema Pain Descriptors / Indicators: Tender;Tightness Pain Intervention(s): Monitored during session;Repositioned (RN notified to try to  elevate scrotum via sling on lap or with washcloths underneath)    Home Living                      Prior Function            PT Goals (current goals can now be found in the care plan section) Acute Rehab PT Goals Patient Stated Goal: return home PT Goal Formulation: With patient Time For Goal Achievement: 11/12/20 Potential to Achieve Goals: Good Progress towards PT goals: Progressing toward goals    Frequency    Min 3X/week      PT Plan Current plan remains appropriate    Co-evaluation              AM-PAC PT "6 Clicks" Mobility   Outcome Measure  Help needed turning from your back to your side while in a flat bed without using bedrails?: None Help needed moving from lying on your back to sitting on the side of a flat bed without using bedrails?: None Help needed moving to and from a bed to a chair (including a wheelchair)?: A Little Help needed standing up from a chair using your arms (e.g., wheelchair or bedside chair)?: A Little Help needed to walk in hospital room?: A Little Help needed climbing 3-5 steps with a railing? : A Little 6 Click Score: 20    End of Session Equipment Utilized During Treatment: Oxygen;Gait belt Activity Tolerance: Patient tolerated treatment well Patient left: in chair;with call bell/phone within reach Nurse Communication: Mobility status PT Visit Diagnosis: Difficulty in walking, not elsewhere classified (R26.2)     Time: 9798-9211 PT Time Calculation (min) (ACUTE ONLY): 38 min  Charges:  $Gait Training: 23-37 mins $Therapeutic Activity: 8-22 mins                     Emilija Bohman P., PTA Acute Rehabilitation Services Pager: [phone] Office: [place] 11/06/2020, 3:41 PM

## 2020-11-06 NOTE — Progress Notes (Signed)
Occupational Therapy Treatment Patient Details Name: Sean [name] MRN: [id number] DOB: [birth year] Today's Date: 11/06/2020    History of present illness Pt is a 70 year old man admitted for R to L fem-fem BPG. PMH: former smoker, COPD, HLD, HTN, arthritis.   OT comments  Pt. Seen for skilled OT session.  Focus of session was introduction and return demo of use of A/E for LB bathing and dressing.  Pt. Able to use items for lb dressing with min a. Reports interest for use.  States he also has some of the items at home already.  Eager for d/c home when able.    Follow Up Recommendations  No OT follow up    Equipment Recommendations  None recommended by OT    Recommendations for Other Services      Precautions / Restrictions Precautions Precautions: Fall Precaution Comments: watch 02       Mobility Bed Mobility                    Transfers                      Balance                                           ADL either performed or assessed with clinical judgement   ADL Overall ADL's : Needs assistance/impaired             Lower Body Bathing: With adaptive equipment;Cueing for sequencing Lower Body Bathing Details (indicate cue type and reason): provided example of LH sponge and how to use for LB bathing     Lower Body Dressing: With adaptive equipment;Cueing for sequencing;Minimal assistance;Sitting/lateral leans Lower Body Dressing Details (indicate cue type and reason): introduced reacher and sock aide for pt. to perform LB dressing               General ADL Comments: pt. able to utilize A/E for lb dressing/bathing demo.  good return demo.  provided inforamtion on how to obtain if interested for use as pt. lives at home alone.     Vision       Perception     Praxis      Cognition Arousal/Alertness: Awake/alert Behavior During Therapy: WFL for tasks assessed/performed Overall Cognitive Status: Within Functional  Limits for tasks assessed                                          Exercises     Shoulder Instructions       General Comments      Pertinent Vitals/ Pain       Pain Assessment: No/denies pain  Home Living                                          Prior Functioning/Environment              Frequency  Min 2X/week        Progress Toward Goals  OT Goals(current goals can now be found in the care plan section)  Progress towards OT goals: Progressing toward goals     Plan  Co-evaluation                 AM-PAC OT "6 Clicks" Daily Activity     Outcome Measure   Help from another person eating meals?: None Help from another person taking care of personal grooming?: A Little Help from another person toileting, which includes using toliet, bedpan, or urinal?: A Little Help from another person bathing (including washing, rinsing, drying)?: A Little Help from another person to put on and taking off regular upper body clothing?: None Help from another person to put on and taking off regular lower body clothing?: A Little 6 Click Score: 20    End of Session Equipment Utilized During Treatment: Oxygen  OT Visit Diagnosis: Unsteadiness on feet (R26.81);Other abnormalities of gait and mobility (R26.89);Pain;Other (comment)   Activity Tolerance Patient tolerated treatment well   Patient Left in chair;with call bell/phone within reach   Nurse Communication          Time: 1042-1050 OT Time Calculation (min): 8 min  Charges: OT General Charges $OT Visit: 1 Visit OT Treatments $Self Care/Home Management : 8-22 mins  Sonia Baller, COTA/L Acute Rehabilitation [phone]   Janice Coffin 11/06/2020, 12:58 PM

## 2020-11-06 NOTE — Progress Notes (Addendum)
  Progress Note    11/06/2020 9:47 AM 2 Days Post-Op  Subjective:  No complaints this morning   Vitals:   11/06/20 0758 11/06/20 0800  BP:  (!) 153/70  Pulse:  100  Resp:  20  Temp:  98.2 F (36.8 C)  SpO2: 99% 96%   Physical Exam: Lungs:  Non labored with O2 by [place] Incisions:  Groin incisions c/d/i Extremities:  Peroneal and DP signals by doppler bilaterally Abdomen:  Soft, NT Neurologic: A&O  CBC    Component Value Date/Time   WBC 11.7 (H) 11/05/2020 0400   RBC 2.56 (L) 11/05/2020 0400   HGB 9.4 (L) 11/05/2020 0400   HCT 26.6 (L) 11/05/2020 0400   PLT 193 11/05/2020 0400   MCV 103.9 (H) 11/05/2020 0400   MCH 36.7 (H) 11/05/2020 0400   MCHC 35.3 11/05/2020 0400   RDW 13.6 11/05/2020 0400    BMET    Component Value Date/Time   NA 132 (L) 11/05/2020 0400   NA 134 10/13/2020 0741   K 4.0 11/05/2020 0400   CL 99 11/05/2020 0400   CO2 27 11/05/2020 0400   GLUCOSE 112 (H) 11/05/2020 0400   BUN 9 11/05/2020 0400   BUN 11 10/13/2020 0741   CREATININE 0.76 11/05/2020 0400   CALCIUM 8.1 (L) 11/05/2020 0400   GFRNONAA >60 11/05/2020 0400    INR    Component Value Date/Time   INR 1.0 10/30/2020 0900    No intake or output data in the 24 hours ending 11/06/20 0947   Assessment/Plan:  70 y.o. male is s/p Bilateral femoral endarterectomy and femoral to femoral bypass graft 2 Days Post-Op   BLE well perfused based on doppler exam TOC consulted to arrange home O2 Possible d/c home tomorrow     Dagoberto Ligas, PA-C [facility] [phone] 11/06/2020 9:47 AM  I have independently interviewed and examined patient and agree with PA assessment and plan above.   Landon Bassford C. Donzetta Matters, MD [facility] [place] Office: [phone] Pager: [phone]

## 2020-11-06 NOTE — Progress Notes (Signed)
Mobility Specialist: Progress Note   11/06/20 1131  Mobility  Activity Ambulated in hall  Level of Assistance Modified independent, requires aide device or extra time  Assistive Device Front wheel walker  Distance Ambulated (ft) 370 ft  Mobility Response Tolerated well  Mobility performed by Mobility specialist  Bed Position Chair  $Mobility charge 1 Mobility   Pre-Mobility on 2 L/min [place]: 95 HR, 142/83 BP, 95% SpO2 During Mobility:            On 2 L/min [place]: 100 HR, 97% SpO2           On 1 L/min [place]: 103 HR, 92-93% SpO2 Post-Mobility on 1 L/min [place]: 105 HR, 143/81 BP, 96% SpO2  Pt c/o burning sensation in his groin during ambulation, no rating given. Pt audibly SOB towards end of ambulation. Pt to chair after walk per request and was able to recover within 1-2 minutes.   [facility] Fayrene Towner Mobility Specialist Mobility Specialist Phone: [phone]

## 2020-11-06 NOTE — Progress Notes (Signed)
Patient having repeated episodes of desaturation with any movement. Bedside commode placed nearby so he will not have to walk as far to bathroom. Scrotum is extremely edematus as well. Vascular paged by RN x2. Awaiting return call to request lasix.Vital signs are currently stable. Fuller Canada, RN

## 2020-11-06 NOTE — Progress Notes (Signed)
RN page returned immediatley by Dr. Stanford Breed. MD came to see patient. New orders received for IV Lasix, chest x-ray and arterial blood gas. Patient currently sitting up in chair on 2L oxygen saturations in mid 90's. Patient aware of new orders. Fuller Canada, RN

## 2020-11-06 NOTE — TOC Initial Note (Addendum)
Transition of Care (TOC) - Initial/Assessment Note  Sean Gibbons RN, BSN Transitions of Care Unit 4E- RN Case Manager See Treatment Team for direct phone #    Patient Details  Name: Sean [name] MRN: [id number] Date of Birth: 10-Dec-1950  Transition of Care [facility]) CM/SW Contact:    Sean Patricia, RN Phone Number: 11/06/2020, 2:22 PM  Clinical Narrative:                 Pt admitted from home, s/p Bilateral femoral endarterectomy and femoral to femoral bypass graft. Noted referral for home 02 needs.  CM in to speak with pt at bedside- pt has VA benefits as well as Medicare- per pt he would like to use his VA benefits for home 02 needs- pt voiced understanding that the [place] would have to approve the home 02 first through his Primary care doctor at the [place] and this could delay his discharge. Order has been placed for home 02.   Calls made to the [place]- confirmed pt's primary care provider at the [facility] clinic is [employer], and pulm. MD is Sean [name]. Info faxed to his primary care clinic for Sean. Helene [name] to review, info has also been faxed to the home 02 clinic in [place] who will provide the home 02 once they get the clearance from the PCP. Spoke with Sean [name] in [place] who confirmed she has received everything she needs for the home 02 and is now just waiting for the approval from the Primary care provider. Call also made with msg left for covering CSW at the [facility] clinic regarding home 02 needs for patient.   1530- received a return call from Sean [name] at the [facility]- she will send msg to nurse at Sean. Greggory [name] office regarding need for home 02 and to check for fax that was sent earlier for MD to review. Hopefully this can be done today as if not done today and approved prior to 5pm- the [place] offices are not open over the weekend and it will be Monday before it gets approved.   1620- received call from Sean [name] at [place] home 02 clinic. She is working on making home 02  arrangement, however pt will need someone to be at his home to receive 02 delivery, brother lives in [place] and unable to be at the home- pt and brother are trying to find someone to assist with this. Spoke with pt who states the his nephew Sean [name] or Sean [name]'s son may be the ones to come provide transport home and that maybe they can go to home for 02 delivery- pt provided Sean [name] phone # [phone] to provide to the [employer] to contact- Per Sean [name] they will try to contact the nephew but if no answer they will again reach out to patient regarding delivery plan, per Sean [name] they can deliver tomorrow if pt finds someone that can be at the home.   Pt will need to stay here until home 02 has been approved by the [place] and delivered to the home. Portable tank then would be brought by the family from home for transport at time of discharge.   Expected Discharge Plan: Home/Self Care Barriers to Discharge: Continued Medical Work up,Equipment Delay   Patient Goals and CMS Choice Patient states their goals for this hospitalization and ongoing recovery are:: return home CMS Medicare.gov Compare Post Acute Care list provided to:: Patient Choice offered to / list presented to : Patient  Expected Discharge Plan and Services Expected Discharge Plan:  Home/Self Care   Discharge Planning Services: CM Consult Post Acute Care Choice: Durable Medical Equipment Living arrangements for the past 2 months: Single Family Home                 DME Arranged: Oxygen DME Agency: [place] Date DME Agency Contacted: 11/06/20 Time DME Agency Contacted: 1000 Representative spoke with at DME Agency: Sean [name]            Prior Living Arrangements/Services Living arrangements for the past 2 months: [place] with:: Self Patient language and need for interpreter reviewed:: Yes Do you feel safe going back to the place where you live?: Yes      Need for Family Participation in Patient Care:  Yes (Comment) Care giver support system in place?: Yes (comment) Current home services: DME Criminal Activity/Legal Involvement Pertinent to Current Situation/Hospitalization: No - Comment as needed  Activities of Daily Living Home Assistive Devices/Equipment: Hearing aid,BIPAP,Cane (specify quad or straight),Walker (specify type),Shower chair with back ADL Screening (condition at time of admission) Patient's cognitive ability adequate to safely complete daily activities?: Yes Is the patient deaf or have difficulty hearing?: Yes (has hearing aid) Does the patient have difficulty seeing, even when wearing glasses/contacts?: No Does the patient have difficulty concentrating, remembering, or making decisions?: No Patient able to express need for assistance with ADLs?: Yes Does the patient have difficulty dressing or bathing?: No Independently performs ADLs?: Yes (appropriate for developmental age) Does the patient have difficulty walking or climbing stairs?: Yes Weakness of Legs: Left Weakness of Arms/Hands: Both  Permission Sought/Granted Permission sought to share information with : Chartered certified accountant granted to share information with : Yes, Verbal Permission Granted     Permission granted to share info w AGENCY: [facility]        Emotional Assessment Appearance:: Appears stated age Attitude/Demeanor/Rapport: Engaged Affect (typically observed): Appropriate Orientation: : Oriented to Self,Oriented to Place,Oriented to  Time,Oriented to Situation Alcohol / Substance Use: Not Applicable Psych Involvement: No (comment)  Admission diagnosis:  PAD (peripheral artery disease) ([place]) [I73.9] Patient Active Problem List   Diagnosis Date Noted  . PAD (peripheral artery disease) ([place]) 11/04/2020  . Emphysema lung ([place]) 12/29/2017   PCP:  [facility], [place]:   [place], [place] - [address] [place] [address] Phone: [phone] Fax: [phone]  [id number] [place], [place] - [address] [place] [address] Phone: [phone] Fax: [phone]     Social Determinants of Health (SDOH) Interventions    Readmission Risk Interventions No flowsheet data found.

## 2020-11-07 MED ORDER — OXYCODONE-ACETAMINOPHEN 5-325 MG PO TABS: 1.0000 | ORAL_TABLET | ORAL | 0 refills | Status: DC | PRN

## 2020-11-07 NOTE — Progress Notes (Addendum)
  Progress Note    11/07/2020 9:33 AM 3 Days Post-Op  Subjective:  No major complaints. Sitting up in chair when I entered room. Had some desaturations last evening with exertion. Given lasix   Vitals:   11/07/20 0756 11/07/20 0903  BP: 133/81   Pulse: (!) 120   Resp: (!) 21   Temp: 98.9 F (37.2 C)   SpO2: 94% 93%   Physical Exam: Cardiac:  regular Lungs:  Non labored, on 2L [place] Incisions: bilateral groin incisions clean, dry and intact. Dry gauze applied to wick moisture Extremities: well perfused with DP/ PT and peroneal signals bilaterally. Feet warm. Motor and sensation intact Abdomen:  Obese, soft non tender Neurologic: alert and oriented  CBC    Component Value Date/Time   WBC 11.7 (H) 11/05/2020 0400   RBC 2.56 (L) 11/05/2020 0400   HGB 9.4 (L) 11/05/2020 0400   HCT 26.6 (L) 11/05/2020 0400   PLT 193 11/05/2020 0400   MCV 103.9 (H) 11/05/2020 0400   MCH 36.7 (H) 11/05/2020 0400   MCHC 35.3 11/05/2020 0400   RDW 13.6 11/05/2020 0400    BMET    Component Value Date/Time   NA 132 (L) 11/05/2020 0400   NA 134 10/13/2020 0741   K 4.0 11/05/2020 0400   CL 99 11/05/2020 0400   CO2 27 11/05/2020 0400   GLUCOSE 112 (H) 11/05/2020 0400   BUN 9 11/05/2020 0400   BUN 11 10/13/2020 0741   CREATININE 0.76 11/05/2020 0400   CALCIUM 8.1 (L) 11/05/2020 0400   GFRNONAA >60 11/05/2020 0400    INR    Component Value Date/Time   INR 1.0 10/30/2020 0900     Intake/Output Summary (Last 24 hours) at 11/07/2020 0933 Last data filed at 11/07/2020 0530 Gross per 24 hour  Intake 240 ml  Output 900 ml  Net -660 ml     Assessment/Plan:  70 y.o. male is s/p Bilateral femoral endarterectomy and femoral to femoral bypass graft 3 Days Post-Op . BLE well perfused and warm with doppler signals. Bilateral groin incisions c/d/i. Had some desaturations last night. Chest x ray obtained which shows some atelectasis. Sats maintaining >95% on 2L [place].  Given IV lasix. Feels good this  morning. Has IS at home and have encouraged him to use this daily. He is otherwise stable for discharge home today once he has arranged family member to bring home oxygen to hospital for him. He will go home on Aspirin and Statin. He has follow up arranged on 11/20/20 with Dr. Jeri Lager, PA-C [facility] [phone] 11/07/2020 9:33 AM   VASCULAR STAFF ADDENDUM: I have independently interviewed and examined the patient. I agree with the above.  Significant improvement with Lasix. Wants to go home. I think this is safe.  Instructed to return to care for shortness of breath. Otherwise follow up with Dr. Donzetta Matters as arranged.  Yevonne Aline. Stanford Breed, MD [facility] Phone Number: [phone] 11/07/2020 11:27 AM

## 2020-11-07 NOTE — TOC Progression Note (Addendum)
Transition of Care [facility]) - Progression Note    Patient Details  Name: Colen Eltzroth MRN: [id number] Date of Birth: 1950-11-20  Transition of Care [facility]) CM/SW Contact  Zenon Mayo, RN Phone Number: 11/07/2020, 9:56 AM  Clinical Narrative:    NCM contactedPaul Brentley's phone # -[phone] patient 's nephew, he states the [place] is suppose to bring oxygen to patient's home and call him so that he can be there when they come.  He also sttes he will bring a tank with him for patient to go home with once that's done.  He has not heard from them yet. Nephew has received the oxygen from the [place] and he is on the way to transport patient home.   Expected Discharge Plan: Home/Self Care Barriers to Discharge: Continued Medical Work up,Equipment Delay  Expected Discharge Plan and Services Expected Discharge Plan: Home/Self Care   Discharge Planning Services: CM Consult Post Acute Care Choice: Durable Medical Equipment Living arrangements for the past 2 months: Single Family Home Expected Discharge Date: 11/07/20               DME Arranged: Oxygen DME Agency: [place] Date DME Agency Contacted: 11/06/20 Time DME Agency Contacted: 1000 Representative spoke with at DME Agency: [place]             Social Determinants of Health ([place]) Interventions    Readmission Risk Interventions No flowsheet data found.

## 2020-11-07 NOTE — Plan of Care (Signed)
Progressing, will continue to monitor.  

## 2020-11-07 NOTE — Discharge Summary (Signed)
Bypass Discharge Summary Patient ID: Sean [name] [id number] 70 y.o. 1951/05/16  Admit date: 11/04/2020  Discharge date and time: No discharge date for patient encounter.   Admitting Physician: Waynetta Sandy, MD   Discharge Physician: Jamelle Haring, MD  Admission Diagnoses: PAD (peripheral artery disease) [facility]) [I73.9]  Discharge Diagnoses: PAD  Admission Condition: fair  Discharged Condition: good  Indication for Admission: 70 year old male with short distance claudication borders on left lower extremity rest pain.  He has undergone aortogram he is now indicated for endarterectomy on the left with retrograde stent versus right to left femoral to femoral bypass.  Hospital Course: 70 year old male was admitted on 3/23 and underwent percutaneous access of right common femoral artery with ultrasound guidance, Aortogram, bilateral common femoral endarterectomies with bovine pericardial patch angioplasty and right to left femoral to femoral artery bypass with 8 mm Dacron. He tolerated the procedure well and was taken to the PACU in stable condition. He did well in the PACU and was later transferred to 4E in stable condition.  Later day of surgery he had adequate perfusion with doppler signals in DP to bilateral lower extremities. Groins remained soft without hematoma.  POD#1 Patient did well overnight.  Did not sleep much secondary to discomfort at surgical sites. Lower extremities well perfused and warm with doppler DP bilaterally. Bilateral groin incisions clean, dry and intact. Encourage mobility. Pain control. PT/ OT evaluated patient and recommendation was for no follow up. When working with mobility specialist had increased Shortness of breath. No lower extremity symptoms.  Patient was noted to have consistently low SpO2 in the mid 80's. Started supplementing with 2L [place]. Recommendation was for patient to go home on continuous oxygen. Order was placed through the [place]  POD#2  patient did well overnight. Bilateral femoral incisions intact. Patent femoral to femoral bypass graft. Lower extremities well perfused and warm with doppler signals bilaterally. Continued to encourage mobilization. Tolerating diet and voiding without difficulty. Tolerated working with mobility specialist. Some continued shortness of breath with exertion. Maintained on 2L [place]. TOC assisted in arrangement of home oxygen through the [place]. Patients family to arrange someone to pick up the oxygen at patients home and bring it to the hospital at time of discharge.   POD#3 Continued adequate perfusion of bilateral lower extremities. Bilateral groin incisions clean dry and intact. Pain well controlled. Tolerating ambulation. Had some desaturations overnight. Chest x ray obtained which shows some atelectasis. Sats maintaining >95% on 2L [place].  Given IV lasix. Feels good overall. Has IS at home and have encouraged him to use this daily. He is otherwise stable for discharge home today. Home oxygen arranged from [place] and family member able to bring it for discharge. He otherwise remained stable for discharge home. He will go home on Aspirin and Statin. PDMP was reviewed and pain medication sent to patients pharmacy. He has follow up arranged in 2 weeks on 11/20/20 with Dr. Donzetta Matters  Consults: None  Treatments: analgesia: acetaminophen, therapies: PT and OT and surgery: percutaneous access of right common femoral artery with ultrasound guidance, Aortogram, bilateral common femoral endarterectomies with bovine pericardial patch angioplasty and right to left femoral to femoral artery bypass with 8 mm Dacron.   Disposition: Discharge disposition: 01-Home or Self Care       - For [facility] Registry use ---  Post-op:  Wound infection: No  Graft infection: No  Transfusion: No  If yes, 0 units given New Arrhythmia: No Patency judged by: Valu.Nieves ] Dopper only, [ ]   Palpable graft pulse, [ ]  Palpable distal pulse, [ ]  ABI inc. > 0.15, [ ]   Duplex D/C Ambulatory Status: Ambulatory  Complications: MI: Valu.Nieves ] No, [ ]  Troponin only, [ ]  EKG or Clinical CHF: No Resp failure: Valu.Nieves ] none, [ ]  Pneumonia, [ ]  Ventilator Chg in renal function: Valu.Nieves ] none, [ ]  Inc. Cr > 0.5, [ ]  Temp. Dialysis, [ ]  Permanent dialysis Stroke: Valu.Nieves ] None, [ ]  Minor, [ ]  Major Return to OR: No  Reason for return to OR: [ ]  Bleeding, [ ]  Infection, [ ]  Thrombosis, [ ]  Revision  Discharge medications: Statin use:  Yes ASA use:  Yes Plavix use:  No  for medical reason not indicated Beta blocker use: Yes Coumadin use: No  for medical reason not indicated    Patient Instructions:  Allergies as of 11/07/2020      Reactions   Amlodipine    Other reaction(s): Edema of lower extremity      Medication List    TAKE these medications   albuterol (2.5 MG/3ML) 0.083% nebulizer solution Commonly known as: PROVENTIL Take 2.5 mg by nebulization every 6 (six) hours as needed for wheezing or shortness of breath.   albuterol 108 (90 Base) MCG/ACT inhaler Commonly known as: VENTOLIN HFA Inhale 2 puffs into the lungs every 6 (six) hours as needed for wheezing or shortness of breath.   aspirin EC 81 MG tablet Take 81 mg by mouth daily. Swallow whole.   benazepril 10 MG tablet Commonly known as: LOTENSIN Take 1 tablet (10 mg total) by mouth at bedtime.   cholecalciferol 1000 units tablet Commonly known as: VITAMIN D Take 1,000 Units by mouth 2 (two) times daily.   cilostazol 100 MG tablet Commonly known as: PLETAL Take 100 mg by mouth 2 (two) times daily.   Fish Oil 1000 MG Caps Take 1,000 mg by mouth daily.   Fluticasone-Salmeterol 250-50 MCG/DOSE Aepb Commonly known as: ADVAIR Inhale 1 puff into the lungs 2 (two) times daily.   folic acid 1 MG tablet Commonly known as: FOLVITE Take 1 mg by mouth daily.   hydrochlorothiazide 25 MG tablet Commonly known as: HYDRODIURIL Take 25 mg by mouth daily.   hydrocortisone 2.5 % cream Apply 1  application topically 3 (three) times daily as needed (itching). rectally   LACTOBACILLUS PO Take 2 capsules by mouth daily.   Magnesium Oxide 420 MG Tabs Take 420 mg by mouth daily.   meloxicam 15 MG tablet Commonly known as: MOBIC Take 15 mg by mouth daily.   metoprolol 200 MG 24 hr tablet Commonly known as: TOPROL-XL Take 200 mg by mouth daily.   multivitamin with minerals tablet Take 1 tablet by mouth daily.   niacin 100 MG tablet Take 100 mg by mouth at bedtime.   oxyCODONE-acetaminophen 5-325 MG tablet Commonly known as: Percocet Take 1 tablet by mouth every 4 (four) hours as needed for severe pain.   pantoprazole 40 MG tablet Commonly known as: PROTONIX Take 40 mg by mouth 2 (two) times daily.   rosuvastatin 20 MG tablet Commonly known as: CRESTOR Take 10 mg by mouth daily.   thiamine 100 MG tablet Take 100 mg by mouth daily.   tiotropium 18 MCG inhalation capsule Commonly known as: SPIRIVA Place 18 mcg into inhaler and inhale daily.            Durable Medical Equipment  (From admission, onward)         Start  Ordered   11/07/20 0000  For home use only DME oxygen       Comments: To be supplied by [facility]  Question Answer Comment  Length of Need Lifetime   Mode or (Route) Nasal cannula   Liters per Minute 2   Frequency Continuous (stationary and portable oxygen unit needed)   Oxygen delivery system Gas      11/07/20 0947   11/06/20 0954  For home use only DME oxygen  Once       Question Answer Comment  Length of Need 6 Months   Mode or (Route) Nasal cannula   Liters per Minute 2   Frequency Continuous (stationary and portable oxygen unit needed)   Oxygen delivery system Gas      11/06/20 0954           Discharge Care Instructions  (From admission, onward)         Start     Ordered   11/07/20 0000  Discharge wound care:       Comments: Clean bilateral groin wounds with mild soap and water. Pat dry. Do not soak in tub. Do not apply  any topical ointments or creams. Place dry gauze in both groins to help wick moisture   11/07/20 0947         Activity: activity as tolerated, no heavy lifting, pushing, pulling with the implant side for 2 months and no driving while on analgesics Diet: low fat, low cholesterol diet Wound Care: keep wound clean and dry. Clean bilateral groin wounds with mild soap and water daily. Pat dry. Do not soak in bath tub. Place dry gauze in both groins to help wick moisture  Follow-up with Dr. Donzetta Matters in 2 weeks.  Signed: Karoline Caldwell 11/07/2020 9:48 AM

## 2020-11-18 DIAGNOSIS — J310 Chronic rhinitis: Secondary | ICD-10-CM | POA: Insufficient documentation

## 2020-11-18 DIAGNOSIS — F4312 Post-traumatic stress disorder, chronic: Secondary | ICD-10-CM | POA: Insufficient documentation

## 2020-11-18 DIAGNOSIS — IMO0002 Reserved for concepts with insufficient information to code with codable children: Secondary | ICD-10-CM | POA: Insufficient documentation

## 2020-11-18 DIAGNOSIS — G4730 Sleep apnea, unspecified: Secondary | ICD-10-CM | POA: Insufficient documentation

## 2020-11-18 DIAGNOSIS — L719 Rosacea, unspecified: Secondary | ICD-10-CM | POA: Insufficient documentation

## 2020-11-18 DIAGNOSIS — R600 Localized edema: Secondary | ICD-10-CM | POA: Insufficient documentation

## 2020-11-18 DIAGNOSIS — K760 Fatty (change of) liver, not elsewhere classified: Secondary | ICD-10-CM | POA: Insufficient documentation

## 2020-11-18 DIAGNOSIS — M79673 Pain in unspecified foot: Secondary | ICD-10-CM | POA: Insufficient documentation

## 2020-11-18 DIAGNOSIS — Z760 Encounter for issue of repeat prescription: Secondary | ICD-10-CM | POA: Insufficient documentation

## 2020-11-18 DIAGNOSIS — Z961 Presence of intraocular lens: Secondary | ICD-10-CM | POA: Insufficient documentation

## 2020-11-18 DIAGNOSIS — R9431 Abnormal electrocardiogram [ECG] [EKG]: Secondary | ICD-10-CM | POA: Insufficient documentation

## 2020-11-18 DIAGNOSIS — R221 Localized swelling, mass and lump, neck: Secondary | ICD-10-CM | POA: Insufficient documentation

## 2020-11-18 DIAGNOSIS — H538 Other visual disturbances: Secondary | ICD-10-CM | POA: Insufficient documentation

## 2020-11-18 DIAGNOSIS — I10 Essential (primary) hypertension: Secondary | ICD-10-CM | POA: Insufficient documentation

## 2020-11-18 DIAGNOSIS — Z8249 Family history of ischemic heart disease and other diseases of the circulatory system: Secondary | ICD-10-CM | POA: Insufficient documentation

## 2020-11-18 DIAGNOSIS — M545 Low back pain, unspecified: Secondary | ICD-10-CM | POA: Insufficient documentation

## 2020-11-18 DIAGNOSIS — D049 Carcinoma in situ of skin, unspecified: Secondary | ICD-10-CM | POA: Insufficient documentation

## 2020-11-18 DIAGNOSIS — H259 Unspecified age-related cataract: Secondary | ICD-10-CM | POA: Insufficient documentation

## 2020-11-18 DIAGNOSIS — G4733 Obstructive sleep apnea (adult) (pediatric): Secondary | ICD-10-CM | POA: Insufficient documentation

## 2020-11-18 DIAGNOSIS — H905 Unspecified sensorineural hearing loss: Secondary | ICD-10-CM | POA: Insufficient documentation

## 2020-11-18 DIAGNOSIS — R7303 Prediabetes: Secondary | ICD-10-CM | POA: Insufficient documentation

## 2020-11-18 DIAGNOSIS — M25552 Pain in left hip: Secondary | ICD-10-CM | POA: Insufficient documentation

## 2020-11-18 DIAGNOSIS — F17203 Nicotine dependence unspecified, with withdrawal: Secondary | ICD-10-CM | POA: Insufficient documentation

## 2020-11-18 DIAGNOSIS — E559 Vitamin D deficiency, unspecified: Secondary | ICD-10-CM | POA: Insufficient documentation

## 2020-11-18 DIAGNOSIS — R220 Localized swelling, mass and lump, head: Secondary | ICD-10-CM | POA: Insufficient documentation

## 2020-11-18 DIAGNOSIS — Z23 Encounter for immunization: Secondary | ICD-10-CM | POA: Insufficient documentation

## 2020-11-18 DIAGNOSIS — K429 Umbilical hernia without obstruction or gangrene: Secondary | ICD-10-CM | POA: Insufficient documentation

## 2020-11-18 DIAGNOSIS — H524 Presbyopia: Secondary | ICD-10-CM | POA: Insufficient documentation

## 2020-11-18 DIAGNOSIS — L570 Actinic keratosis: Secondary | ICD-10-CM | POA: Insufficient documentation

## 2020-11-18 DIAGNOSIS — H9319 Tinnitus, unspecified ear: Secondary | ICD-10-CM | POA: Insufficient documentation

## 2020-11-18 DIAGNOSIS — Z719 Counseling, unspecified: Secondary | ICD-10-CM | POA: Insufficient documentation

## 2020-11-18 DIAGNOSIS — H04123 Dry eye syndrome of bilateral lacrimal glands: Secondary | ICD-10-CM | POA: Insufficient documentation

## 2020-11-18 DIAGNOSIS — L723 Sebaceous cyst: Secondary | ICD-10-CM | POA: Insufficient documentation

## 2020-11-18 DIAGNOSIS — H52209 Unspecified astigmatism, unspecified eye: Secondary | ICD-10-CM | POA: Insufficient documentation

## 2020-11-18 DIAGNOSIS — F17200 Nicotine dependence, unspecified, uncomplicated: Secondary | ICD-10-CM | POA: Insufficient documentation

## 2020-11-18 DIAGNOSIS — L309 Dermatitis, unspecified: Secondary | ICD-10-CM | POA: Insufficient documentation

## 2020-11-18 DIAGNOSIS — K7010 Alcoholic hepatitis without ascites: Secondary | ICD-10-CM | POA: Insufficient documentation

## 2020-11-18 DIAGNOSIS — L568 Other specified acute skin changes due to ultraviolet radiation: Secondary | ICD-10-CM | POA: Insufficient documentation

## 2020-11-18 DIAGNOSIS — F17201 Nicotine dependence, unspecified, in remission: Secondary | ICD-10-CM | POA: Insufficient documentation

## 2020-11-20 ENCOUNTER — Encounter: Payer: Self-pay | Admitting: Physician Assistant

## 2020-11-20 ENCOUNTER — Ambulatory Visit (INDEPENDENT_AMBULATORY_CARE_PROVIDER_SITE_OTHER): Payer: No Typology Code available for payment source | Admitting: Physician Assistant

## 2020-11-20 ENCOUNTER — Other Ambulatory Visit: Payer: Self-pay

## 2020-11-20 VITALS — BP 107/65 | HR 107 | Temp 98.4°F | Resp 20 | Ht 66.0 in | Wt 205.1 lb

## 2020-11-20 DIAGNOSIS — I739 Peripheral vascular disease, unspecified: Secondary | ICD-10-CM

## 2020-11-20 DIAGNOSIS — Z95828 Presence of other vascular implants and grafts: Secondary | ICD-10-CM

## 2020-11-20 NOTE — Progress Notes (Signed)
POST OPERATIVE OFFICE NOTE    CC:  F/u for surgery  HPI:  This is a 70 y.o. male who is s/p percutaneous access of right common femoral artery with ultrasound guidance, Aortogram, bilateral common femoral endarterectomies with bovine pericardial patch angioplasty and right to left femoral to femoral artery bypass with 8 mm Dacron by Dr. Donzetta Matters on 11/04/20. This was performed secondary to lifestyle limiting claudication with borderline rest pain in the left lower extremity. He has done well since surgery. He says his legs are feeling better. He has not required and pain medications. He has been having some swelling in left lower extremity. He says he has been elevating but just in recliner. He has been sleeping in recliner secondary to his breathing. He has been short of breath since his hospitalization. He was sent home on oxygen from the [place]. He says he does not really notice his shortness of breath unless he exerts himself. He has been using his home oxygen as needed. He says he was confused about how to adequately use it and if it was a long term thing. He otherwise denies any rest pain in his legs or tissue loss.    Allergies  Allergen Reactions  . Amlodipine     Other reaction(s): Edema of lower extremity    Current Outpatient Medications  Medication Sig Dispense Refill  . albuterol (PROVENTIL HFA;VENTOLIN HFA) 108 (90 BASE) MCG/ACT inhaler Inhale 2 puffs into the lungs every 6 (six) hours as needed for wheezing or shortness of breath.    Marland Kitchen albuterol (PROVENTIL) (2.5 MG/3ML) 0.083% nebulizer solution Take 2.5 mg by nebulization every 6 (six) hours as needed for wheezing or shortness of breath.    Marland Kitchen aspirin EC 81 MG tablet Take 81 mg by mouth daily. Swallow whole.    . benazepril (LOTENSIN) 10 MG tablet Take 1 tablet (10 mg total) by mouth at bedtime. 90 tablet 3  . cholecalciferol (VITAMIN D) 1000 UNITS tablet Take 1,000 Units by mouth 2 (two) times daily.    . cilostazol (PLETAL) 100 MG  tablet Take 100 mg by mouth 2 (two) times daily.    . Fluticasone-Salmeterol (ADVAIR) 250-50 MCG/DOSE AEPB Inhale 1 puff into the lungs 2 (two) times daily.    . folic acid (FOLVITE) 1 MG tablet Take 1 mg by mouth daily.    . hydrochlorothiazide (HYDRODIURIL) 25 MG tablet Take 25 mg by mouth daily.    . hydrocortisone 2.5 % cream Apply 1 application topically 3 (three) times daily as needed (itching). rectally    . LACTOBACILLUS PO Take 2 capsules by mouth daily.    . Magnesium Oxide 420 MG TABS Take 420 mg by mouth daily.    . meloxicam (MOBIC) 15 MG tablet Take 15 mg by mouth daily.    . metoprolol (TOPROL-XL) 200 MG 24 hr tablet Take 200 mg by mouth daily.    . Multiple Vitamins-Minerals (MULTIVITAMIN WITH MINERALS) tablet Take 1 tablet by mouth daily.    . niacin 100 MG tablet Take 100 mg by mouth at bedtime.    . Omega-3 Fatty Acids (FISH OIL) 1000 MG CAPS Take 1,000 mg by mouth daily.    . pantoprazole (PROTONIX) 40 MG tablet Take 40 mg by mouth 2 (two) times daily.    . rosuvastatin (CRESTOR) 20 MG tablet Take 10 mg by mouth daily.    Marland Kitchen thiamine 100 MG tablet Take 100 mg by mouth daily.    Marland Kitchen tiotropium (SPIRIVA) 18 MCG inhalation capsule  Place 18 mcg into inhaler and inhale daily.    Marland Kitchen oxyCODONE-acetaminophen (PERCOCET) 5-325 MG tablet Take 1 tablet by mouth every 4 (four) hours as needed for severe pain. (Patient not taking: Reported on 11/20/2020) 30 tablet 0   No current facility-administered medications for this visit.     ROS:  See HPI  Physical Exam:  Vitals:   11/20/20 0940  BP: 107/65  Pulse: (!) 107  Resp: 20  Temp: 98.4 F (36.9 C)  TempSrc: Temporal  SpO2: 90%  Weight: 205 lb 1.6 oz (93 kg)  Height: 5\' 6"  (1.676 m)   General: NAD Cardiac: regular Lungs: breathing slightly labored, clear to auscultation Incision: B groin incisions are clean dry and intact. The right groin has a little erythema around it and some fullness in distal aspect of incision. Dry gauze  applied. Left groin is healing well Extremities: BLE well perfused and warm. 2+ femoral pulses bilaterally, palpable DP pulses bilaterally. Feet warm and well perfused. LLE edematous Neuro: alert and oriented Abdomen:  Distended, soft, non tender  Assessment/Plan:  This is a 70 y.o. male who is s/p percutaneous access of right common femoral artery with ultrasound guidance, Aortogram, bilateral common femoral endarterectomies with bovine pericardial patch angioplasty and right to left femoral to femoral artery bypass with 8 mm Dacron by Dr. Donzetta Matters on 11/04/20. This was performed secondary to lifestyle limiting claudication with borderline rest pain in the left lower extremity. Doing well post op. Symptoms resolved. Recommend continued daily cleaning of bilateral groin incisions with mild soap and water and applying dry gauze to wick moisture. Encourage daily elevation of lower extremities to help with edema - [facility] and continued [facility] oxygen use. I encouraged him to use his oxygen as needed - he will continue on his Aspirin and Plavix - advised him to follow up sooner should he have any new or concerning symptoms - He will follow up in 1 month with ABIs and Fem- Fem bypass graft duplex   Karoline Caldwell, PA-C [facility] [phone]  Clinic MD: Donzetta Matters

## 2020-11-25 ENCOUNTER — Other Ambulatory Visit: Payer: Self-pay

## 2020-11-25 DIAGNOSIS — I739 Peripheral vascular disease, unspecified: Secondary | ICD-10-CM

## 2020-12-07 ENCOUNTER — Telehealth (HOSPITAL_COMMUNITY): Payer: Self-pay

## 2020-12-07 NOTE — Telephone Encounter (Signed)
[place] is covered thru the [place], [place] auth# [id number].

## 2020-12-07 NOTE — Telephone Encounter (Signed)
Called and spoke with pt in regards to [place], pt stated he is interested.

## 2020-12-09 ENCOUNTER — Encounter (HOSPITAL_COMMUNITY): Payer: Self-pay | Admitting: *Deleted

## 2020-12-09 NOTE — Progress Notes (Signed)
Received Goodman [phone] from Dr Gwenette Greet at the [facility]  for this pt to participate in pulmonary rehab with the the diagnosis of COPD. Clinical review of pt follow up appt on 11/30/20 Pulmonary office note.  Pt with Covid Risk Score - 5. Pt appropriate for scheduling for Pulmonary rehab.  Will forward to support staff. Cherre Huger, BSN Cardiac and Training and development officer

## 2020-12-14 ENCOUNTER — Telehealth: Payer: Self-pay

## 2020-12-14 NOTE — Telephone Encounter (Signed)
Patient called to report he is doing well without his oxygen. Advised him if he didn't need it, that was great. He will follow up with our office in a week.

## 2020-12-25 ENCOUNTER — Other Ambulatory Visit: Payer: Self-pay

## 2020-12-25 ENCOUNTER — Ambulatory Visit (INDEPENDENT_AMBULATORY_CARE_PROVIDER_SITE_OTHER): Payer: No Typology Code available for payment source | Admitting: Physician Assistant

## 2020-12-25 ENCOUNTER — Ambulatory Visit (INDEPENDENT_AMBULATORY_CARE_PROVIDER_SITE_OTHER)
Admission: RE | Admit: 2020-12-25 | Discharge: 2020-12-25 | Disposition: A | Discharge status: Home / Self Care | Payer: No Typology Code available for payment source | Source: Ambulatory Visit | Attending: Vascular Surgery | Admitting: Vascular Surgery

## 2020-12-25 ENCOUNTER — Ambulatory Visit (HOSPITAL_COMMUNITY)
Admission: RE | Admit: 2020-12-25 | Discharge: 2020-12-25 | Disposition: A | Discharge status: Home / Self Care | Payer: No Typology Code available for payment source | Source: Ambulatory Visit | Attending: Vascular Surgery | Admitting: Vascular Surgery

## 2020-12-25 VITALS — BP 154/81 | HR 65 | Temp 98.0°F | Resp 20 | Ht 66.0 in | Wt 199.4 lb

## 2020-12-25 DIAGNOSIS — I739 Peripheral vascular disease, unspecified: Secondary | ICD-10-CM

## 2020-12-25 DIAGNOSIS — E612 Magnesium deficiency: Secondary | ICD-10-CM | POA: Insufficient documentation

## 2020-12-25 DIAGNOSIS — Z95828 Presence of other vascular implants and grafts: Secondary | ICD-10-CM

## 2020-12-25 DIAGNOSIS — E871 Hypo-osmolality and hyponatremia: Secondary | ICD-10-CM | POA: Insufficient documentation

## 2020-12-25 NOTE — Progress Notes (Signed)
Office Note     CC:  follow up Requesting Provider:  [facility]  HPI: Sean [name] is a 70 y.o. (1951-02-10) male who presents s/p bilateral common femoral endarterectomies with bovine pericardial patch angioplasty and right to left femoral to femoral bypass with Dacron by Dr. Donzetta Matters on 11/04/20. This was performed due to lifestyle limiting short distance claudication/ borderline rest pain.He says he is doing well post operatively. He was sent home on supplemental Oxygen but he says his Oxygen has been 96% on room air and he has not needed to use his home oxygen. Otherwise he says his claudication symptoms are resolved. He says he was able to walk his entire back yard several days after surgery without any discomfort. He has been trying to walk more. He does report swelling in both legs, left more than right. He does elevate in recliner and wears compression stockings but he says he cannon tolerated the stockings on the left leg. He feels that when he wears it on the left leg it is far too tight and like it "is cutting off my circulation". He reports that his incisions have healed well. He has no rest pain or tissue loss. He has been compliant with his Asprin, statin and Pletal.   The pt is on a statin for cholesterol management.  The pt is on a daily aspirin.   Other AC: Pletal,  The pt is on BB, Diuretic for hypertension.   The pt is not  diabetic.  Tobacco hx:  Former, quit 2014  Past Medical History:  Diagnosis Date  . Arthritis   . COPD (chronic obstructive pulmonary disease) ([place])   . GERD (gastroesophageal reflux disease)   . Hyperlipidemia   . Hypertension   . Sleep apnea     Past Surgical History:  Procedure Laterality Date  . ABDOMINAL AORTOGRAM W/LOWER EXTREMITY Bilateral 09/28/2020   Procedure: ABDOMINAL AORTOGRAM W/LOWER EXTREMITY;  Surgeon: Waynetta Sandy, MD;  Location: [facility];  Service: Cardiovascular;  Laterality: Bilateral;  . BACK  SURGERY    . ENDARTERECTOMY FEMORAL Left 11/04/2020   Procedure: LEFT COMMON FEMORAL ARTERY ENDARTERECTOMY;  Surgeon: Waynetta Sandy, MD;  Location: [place];  Service: Vascular;  Laterality: Left;  . EYE SURGERY    . FEMORAL-FEMORAL BYPASS GRAFT Bilateral 11/04/2020   Procedure: RIGHT TO LEFT FEMORAL-FEMORAL BYPASS;  Surgeon: Waynetta Sandy, MD;  Location: [place];  Service: Vascular;  Laterality: Bilateral;  . HERNIA REPAIR    . PATCH ANGIOPLASTY Bilateral 11/04/2020   Procedure: PATCH ANGIOPLASTY LEFT AND RIGHT COMMON FEMORAL ARTERY;  Surgeon: Waynetta Sandy, MD;  Location: [place];  Service: Vascular;  Laterality: Bilateral;  . ULTRASOUND GUIDANCE FOR VASCULAR ACCESS Bilateral 11/04/2020   Procedure: ULTRASOUND GUIDANCE FOR VASCULAR ACCESS;  Surgeon: Waynetta Sandy, MD;  Location: [place];  Service: Vascular;  Laterality: Bilateral;  . WISDOM TOOTH EXTRACTION      Social History   Socioeconomic History  . Marital status: Single    Spouse name: Not on file  . Number of children: Not on file  . Years of education: Not on file  . Highest education level: Not on file  Occupational History  . Not on file  Tobacco Use  . Smoking status: Former Smoker    Types: Cigarettes    Quit date: 06/25/2013    Years since quitting: 7.5  . Smokeless tobacco: Former Systems developer    Quit date: 06/25/1976  Vaping Use  . Vaping  Use: Never used  Substance and Sexual Activity  . Alcohol use: Yes    Alcohol/week: 30.0 standard drinks    Types: 30 Cans of beer per week  . Drug use: No  . Sexual activity: Not on file  Other Topics Concern  . Not on file  Social History Narrative  . Not on file   Social Determinants of Health   Financial Resource Strain: Not on file  Food Insecurity: Not on file  Transportation Needs: Not on file  Physical Activity: Not on file  Stress: Not on file  Social Connections: Not on file  Intimate Partner Violence: Not on file   Family  History  Problem Relation Age of Onset  . COPD Mother   . Diabetes Mother   . Hypertension Father   . COPD Father   . Heart disease Father   . Arthritis Sister     Current Outpatient Medications  Medication Sig Dispense Refill  . albuterol (PROVENTIL HFA;VENTOLIN HFA) 108 (90 BASE) MCG/ACT inhaler Inhale 2 puffs into the lungs every 6 (six) hours as needed for wheezing or shortness of breath.    Marland Kitchen albuterol (PROVENTIL) (2.5 MG/3ML) 0.083% nebulizer solution Take 2.5 mg by nebulization every 6 (six) hours as needed for wheezing or shortness of breath.    Marland Kitchen aspirin EC 81 MG tablet Take 81 mg by mouth daily. Swallow whole.    . benazepril (LOTENSIN) 10 MG tablet Take 1 tablet (10 mg total) by mouth at bedtime. 90 tablet 3  . carboxymethylcellulose (REFRESH PLUS) 0.5 % SOLN Apply to eye.    . cholecalciferol (VITAMIN D) 1000 UNITS tablet Take 1,000 Units by mouth 2 (two) times daily.    . cilostazol (PLETAL) 100 MG tablet Take 100 mg by mouth 2 (two) times daily.    Marland Kitchen erythromycin ophthalmic ointment Apply to eye.    . ferrous sulfate 325 (65 FE) MG tablet Take 1 tablet by mouth daily.    . Fluticasone-Salmeterol (ADVAIR) 250-50 MCG/DOSE AEPB Inhale 1 puff into the lungs 2 (two) times daily.    . folic acid (FOLVITE) 1 MG tablet Take 1 mg by mouth daily.    . hydrocortisone 2.5 % cream Apply 1 application topically 3 (three) times daily as needed (itching). rectally    . LACTOBACILLUS PO Take 2 capsules by mouth daily.    . Magnesium Oxide 420 MG TABS Take 420 mg by mouth daily.    . meloxicam (MOBIC) 15 MG tablet Take 15 mg by mouth daily.    . metoprolol (TOPROL-XL) 200 MG 24 hr tablet Take 200 mg by mouth daily.    . Multiple Vitamins-Minerals (MULTIVITAMIN WITH MINERALS) tablet Take 1 tablet by mouth daily.    . niacin 100 MG tablet Take 100 mg by mouth at bedtime.    . Omega-3 Fatty Acids (FISH OIL) 1000 MG CAPS Take 1,000 mg by mouth daily.    . pantoprazole (PROTONIX) 40 MG tablet  Take 40 mg by mouth 2 (two) times daily.    . rosuvastatin (CRESTOR) 20 MG tablet Take 10 mg by mouth daily.    Marland Kitchen spironolactone (ALDACTONE) 25 MG tablet Take 1 tablet by mouth daily.    Marland Kitchen thiamine 100 MG tablet Take 100 mg by mouth daily.    Marland Kitchen tiotropium (SPIRIVA) 18 MCG inhalation capsule Place 18 mcg into inhaler and inhale daily.     No current facility-administered medications for this visit.    Allergies  Allergen Reactions  . Amlodipine  Other reaction(s): Edema of lower extremity     REVIEW OF SYSTEMS:  [X]  denotes positive finding, [ ]  denotes negative finding Cardiac  Comments:  Chest pain or chest pressure:    Shortness of breath upon exertion: X   Short of breath when lying flat: X   Irregular heart rhythm:        Vascular    Pain in calf, thigh, or hip brought on by ambulation:    Pain in feet at night that wakes you up from your sleep:     Blood clot in your veins:    Leg swelling:         Pulmonary    Oxygen at home:    Productive cough:     Wheezing:         Neurologic    Sudden weakness in arms or legs:     Sudden numbness in arms or legs:     Sudden onset of difficulty speaking or slurred speech:    Temporary loss of vision in one eye:     Problems with dizziness:         Gastrointestinal    Blood in stool:     Vomited blood:         Genitourinary    Burning when urinating:     Blood in urine:        Psychiatric    Major depression:         Hematologic    Bleeding problems:    Problems with blood clotting too easily:        Skin    Rashes or ulcers:        Constitutional    Fever or chills:      PHYSICAL EXAMINATION:  Vitals:   12/25/20 1059  BP: (!) 154/81  Pulse: 65  Resp: 20  Temp: 98 F (36.7 C)  TempSrc: Temporal  SpO2: 96%  Weight: 199 lb 6.4 oz (90.4 kg)  Height: 5\' 6"  (1.676 m)    General:  WDWN in NAD; vital signs documented above Gait: Not observed HENT: WNL, normocephalic Pulmonary: normal non-labored  breathing , without wheezing Cardiac: regular HR, without  Murmurs without carotid bruit Abdomen: distended, soft, NT, no masses Vascular Exam/Pulses:  Right Left  Radial 2+ (normal) 2+ (normal)  Femoral 2+ (normal) 2+ (normal)  Popliteal 2+ (normal) Not palpable  DP 2+ (normal) 2+ (normal)  PT 2+ (normal) 2+ (normal)  Palpable pulse in fem fem bypass graft. Small area of fullness in left groin. No tenderness. No erythema Extremities: without ischemic changes, without Gangrene , without cellulitis; without open wounds;  Musculoskeletal: no muscle wasting or atrophy  Neurologic: A&O X 3;  No focal weakness or paresthesias are detected Psychiatric:  The pt has Normal affect.   Non-Invasive Vascular Imaging:   +-------+-----------+-----------+------------+------------+  ABI/TBIToday's ABIToday's TBIPrevious ABIPrevious TBI  +-------+-----------+-----------+------------+------------+  Right 0.80    0.57    0.84    0.58      +-------+-----------+-----------+------------+------------+  Left  0.90    0.60    0.64    0.39      +-------+-----------+-----------+------------+------------+  Arterial duplex shows patent right to left fem-fem bypass graft with perigraft fluid seen surrounding the proximal anastomosis. Biphasic and triphasic waveforms throughout. There is some monophasic flow noted in the proximal graft   ASSESSMENT/PLAN:: 70 y.o. male here for follow up for bilateral common femoral endarterectomies with bovine pericardial patch angioplasty and right to left femoral to femoral bypass with Dacron  by Dr. Donzetta Matters on 11/04/20. His incisions have healed nicely. His claudication symptoms have resolved post op. His ABIs today are stable on the RLE and have improved on the left. His arterial duplex shows patent fem- fem bypass graft. He does have small fluid collection noted at anastomosis. Hopefully this is small seroma that will resolve with time.  No evidence of any infection. Encourage him to stick to a walking regimen. He will additionally elevate as needed and use compression stockings PRN - Continue Aspirin, statin and Pletal - he will follow up in 6 months with ABIs and fem- fem bypass graft duplex - Advised him to call for earlier follow up if he develops any new or concerning symptoms   Karoline Caldwell, PA-C [facility] [phone]  Clinic MD:   Donzetta Matters

## 2020-12-28 ENCOUNTER — Other Ambulatory Visit: Payer: Self-pay | Admitting: *Deleted

## 2020-12-28 DIAGNOSIS — Z95828 Presence of other vascular implants and grafts: Secondary | ICD-10-CM

## 2020-12-28 DIAGNOSIS — I739 Peripheral vascular disease, unspecified: Secondary | ICD-10-CM

## 2021-01-03 NOTE — Progress Notes (Deleted)
Cardiology Office Note:    Date:  01/03/2021   ID:  Sean [name], DOB 05-06-1951, MRN [id number]  PCP:  [facility]  Cardiologist:  None  Advanced Practice Provider:  No care team member to display Electrophysiologist:  None    Referring MD: [facility] [place]     History of Present Illness:    Sean [name] is a 70 y.o. male with a hx of HTN, HLD, severe COPD and peripheral vascular disease s/p bilateral common femoral endarterectomies with bovine pericardial patch angioplasty and right to left femoral to femoral artery bypass with 8 mm Dacron with Dr. Donzetta Matters who now presents to clinic for follow-up.  Patient initially seen on 10/05/20 for pre-operative evaluation prior to peripheral intervention detailed above. Functional capacity limited by claudication. He underwent myoview on 10/27/20 which showed normal LVEF 59%, no ischemia, had inferior fixed defect deemed most likely to be artifact given normal WM in that area.TTE with LVEF 55-60%, G1DD, dilation of ascending aorta 8mm. He was cleared to undergo peripheral intervention which occurred on 11/04/20.  He now returns to clinic for follow-up.  Family History: Mother-HTN, Father-CAD s/p bypass, HTN; brother with arrhythmias.   Past Medical History:  Diagnosis Date  . Arthritis   . COPD (chronic obstructive pulmonary disease) ([place])   . GERD (gastroesophageal reflux disease)   . Hyperlipidemia   . Hypertension   . Sleep apnea     Past Surgical History:  Procedure Laterality Date  . ABDOMINAL AORTOGRAM W/LOWER EXTREMITY Bilateral 09/28/2020   Procedure: ABDOMINAL AORTOGRAM W/LOWER EXTREMITY;  Surgeon: Waynetta Sandy, MD;  Location: [facility];  Service: Cardiovascular;  Laterality: Bilateral;  . BACK SURGERY    . ENDARTERECTOMY FEMORAL Left 11/04/2020   Procedure: LEFT COMMON FEMORAL ARTERY ENDARTERECTOMY;  Surgeon: Waynetta Sandy, MD;  Location:  [place];  Service: Vascular;  Laterality: Left;  . EYE SURGERY    . FEMORAL-FEMORAL BYPASS GRAFT Bilateral 11/04/2020   Procedure: RIGHT TO LEFT FEMORAL-FEMORAL BYPASS;  Surgeon: Waynetta Sandy, MD;  Location: [place];  Service: Vascular;  Laterality: Bilateral;  . HERNIA REPAIR    . PATCH ANGIOPLASTY Bilateral 11/04/2020   Procedure: PATCH ANGIOPLASTY LEFT AND RIGHT COMMON FEMORAL ARTERY;  Surgeon: Waynetta Sandy, MD;  Location: [place];  Service: Vascular;  Laterality: Bilateral;  . ULTRASOUND GUIDANCE FOR VASCULAR ACCESS Bilateral 11/04/2020   Procedure: ULTRASOUND GUIDANCE FOR VASCULAR ACCESS;  Surgeon: Waynetta Sandy, MD;  Location: [place];  Service: Vascular;  Laterality: Bilateral;  . WISDOM TOOTH EXTRACTION      Current Medications: No outpatient medications have been marked as taking for the 01/05/21 encounter (Appointment) with Freada Bergeron, MD.     Allergies:   Amlodipine   Social History   Socioeconomic History  . Marital status: Single    Spouse name: Not on file  . Number of children: Not on file  . Years of education: Not on file  . Highest education level: Not on file  Occupational History  . Not on file  Tobacco Use  . Smoking status: Former Smoker    Types: Cigarettes    Quit date: 06/25/2013    Years since quitting: 7.5  . Smokeless tobacco: Former Systems developer    Quit date: 06/25/1976  Vaping Use  . Vaping Use: Never used  Substance and Sexual Activity  . Alcohol use: Yes    Alcohol/week: 30.0 standard drinks    Types:  30 Cans of beer per week  . Drug use: No  . Sexual activity: Not on file  Other Topics Concern  . Not on file  Social History Narrative  . Not on file   Social Determinants of Health   Financial Resource Strain: Not on file  Food Insecurity: Not on file  Transportation Needs: Not on file  Physical Activity: Not on file  Stress: Not on file  Social Connections: Not on file     Family History: The  patient's family history includes Arthritis in his sister; COPD in his father and mother; Diabetes in his mother; Heart disease in his father; Hypertension in his father.  ROS:   Please see the history of present illness.    Review of Systems  Constitutional: Positive for malaise/fatigue. Negative for chills and fever.  HENT: Positive for hearing loss. Negative for nosebleeds.   Eyes: Negative for blurred vision.  Respiratory: Positive for shortness of breath.   Cardiovascular: Positive for claudication and leg swelling. Negative for chest pain, palpitations, orthopnea and PND.  Gastrointestinal: Negative for nausea and vomiting.  Genitourinary: Negative for hematuria.  Musculoskeletal: Positive for joint pain. Negative for falls.  Neurological: Negative for dizziness and loss of consciousness.  Endo/Heme/Allergies: Negative for polydipsia.  Psychiatric/Behavioral: Negative for substance abuse.    EKGs/Labs/Other Studies Reviewed:    The following studies were reviewed today: Myoview 10/27/20:  The left ventricular ejection fraction is normal (55-65%).  Nuclear stress EF: 59%.  There was no ST segment deviation noted during stress.  This is a low risk study.   There is a medium size, fixed defect of mild severity present in the basal inferior, mid inferior and apical inferior location. Wall motion is normal in this region, and there is significant extracardiac bowel tracer uptake adjacent to the perfusion defect. These findings in combination suggest defect represents attenuation artifact, however, cannot comment with certainty on inferior wall. No high risk ischemia noted. Overall study appears low risk.   TTE 10/27/20: IMPRESSIONS  1. Left ventricular ejection fraction, by estimation, is 55 to 60%. The  left ventricle has normal function. The left ventricle has no regional  wall motion abnormalities. There is mild left ventricular hypertrophy of  the posterior segment. Left   ventricular diastolic parameters are consistent with Grade I diastolic  dysfunction (impaired relaxation).  2. Right ventricular systolic function is normal. The right ventricular  size is normal. There is normal pulmonary artery systolic pressure.  3. The mitral valve is normal in structure. No evidence of mitral valve  regurgitation. No evidence of mitral stenosis.  4. The aortic valve is calcified. There is mild calcification of the  aortic valve. There is mild thickening of the aortic valve. Aortic valve  regurgitation is trivial. No aortic stenosis is present.  5. Aortic dilatation noted. There is mild dilatation of the ascending  aorta, measuring 38 mm.  6. The inferior vena cava is normal in size with greater than 50%  respiratory variability, suggesting right atrial pressure of 3 mmHg.   09/28/20 Aortogram with bilateral run-off: Findings: The right common iliac artery was heavily calcified required a Glidewire to get through.  The aorta is heavily calcified although patent.  Right common external iliac arteries are patent.  There is severe disease of the right common femoral artery.  On the right lower extremity the SFA is patent as are the popliteal arteries with three-vessel runoff.  On the left side there is a takeoff of the common iliac  artery and then occludes there is a collateral filling of the hypogastric however the takeoff of the hypogastric is occluded and he is occluded in his external leg artery as well all the way to the common femoral artery which also appears subtotally occluded and there is no common femoral pulse on the left.  He does have an SFA runoff to the adductor there appears disease and there is at least runoff via the posterior tibial artery on the left.  Plan will be to perform left common femoral endarterectomy with retrograde left common and external iliac artery stenting and possible right to left femorofemoral bypass.  ABI: Summary:  Right:  Resting right ankle-brachial index indicates mild right lower  extremity arterial disease. The right toe-brachial index is abnormal. RT  great toe pressure = 99 mmHg.   Left: Resting left ankle-brachial index indicates moderate left lower  extremity arterial disease. The left toe-brachial index is abnormal. LT  Great toe pressure = 67 mmHg.   EKG:  EKG is  ordered today.  The ekg ordered today demonstrates NSR, late r-wave progression, HR 88  Recent Labs: 10/30/2020: ALT 32 11/05/2020: BUN 9; Creatinine, Ser 0.76; Hemoglobin 9.4; Platelets 193; Potassium 4.0; Sodium 132  Recent Lipid Panel    Component Value Date/Time   CHOL 84 11/05/2020 0400   TRIG 83 11/05/2020 0400   HDL 53 11/05/2020 0400   CHOLHDL 1.6 11/05/2020 0400   VLDL 17 11/05/2020 0400   LDLCALC 14 11/05/2020 0400      Physical Exam:    VS:  There were no vitals taken for this visit.    Wt Readings from Last 3 Encounters:  12/25/20 199 lb 6.4 oz (90.4 kg)  11/20/20 205 lb 1.6 oz (93 kg)  11/04/20 210 lb (95.3 kg)     GEN:  Well nourished, well developed in no acute distress HEENT: Normal NECK: No JVD; No carotid bruits CARDIAC: RRR, 1/6 systolic murmur. No rubs, gallops RESPIRATORY:  Diminished throughout. No wheezing. ABDOMEN: Soft, non-tender, non-distended MUSCULOSKELETAL:  Trace RLE edema (chronic); no significant LLE edema SKIN: Warm and dry NEUROLOGIC:  Alert and oriented x 3 PSYCHIATRIC:  Normal affect   ASSESSMENT:    No diagnosis found. PLAN:    In order of problems listed above:  #Peripheral Vascular Disease: Now s/p bilateral common femoral endarterectomy with bovine pericardial patch angioplasty and right to left femoral to femoral artery bypass with 8 mm Dacron with Dr. Donzetta Matters on 11/04/20. Doing well with improved symptoms. -Continue ASA 81mg  daily -Continue crestor 20mg  daily  #Systolic Murmur: Likely due to aortic sclerosis without significant stenosis. -Continue to  monitor  #HTN: -Continue benzapril 10mg  daily to take at night -Continue HCTZ 25mg  daily to take in the AM  #HLD: -Continue crestor 20mg  daily  #Severe COPD: -Continue home inhalers -Management per Pulm   Medication Adjustments/Labs and Tests Ordered: Current medicines are reviewed at length with the patient today.  Concerns regarding medicines are outlined above.  No orders of the defined types were placed in this encounter.  No orders of the defined types were placed in this encounter.   There are no Patient Instructions on file for this visit.   Signed, Freada Bergeron, MD  01/03/2021 11:06 AM    [place]

## 2021-01-04 ENCOUNTER — Ambulatory Visit (INDEPENDENT_AMBULATORY_CARE_PROVIDER_SITE_OTHER): Payer: No Typology Code available for payment source | Admitting: Physician Assistant

## 2021-01-04 ENCOUNTER — Other Ambulatory Visit: Payer: Self-pay | Admitting: Physician Assistant

## 2021-01-04 ENCOUNTER — Other Ambulatory Visit: Payer: Self-pay

## 2021-01-04 ENCOUNTER — Inpatient Hospital Stay (HOSPITAL_COMMUNITY)
Admission: AD | Admit: 2021-01-04 | Discharge: 2021-01-15 | DRG: 982 | Disposition: A | Discharge status: Home / Self Care | Payer: No Typology Code available for payment source | Source: Ambulatory Visit | Attending: Vascular Surgery | Admitting: Vascular Surgery

## 2021-01-04 VITALS — BP 136/73 | HR 60 | Temp 98.1°F | Resp 20 | Ht 66.0 in | Wt 194.1 lb

## 2021-01-04 DIAGNOSIS — I700 Atherosclerosis of aorta: Secondary | ICD-10-CM | POA: Diagnosis present

## 2021-01-04 DIAGNOSIS — M1990 Unspecified osteoarthritis, unspecified site: Secondary | ICD-10-CM | POA: Diagnosis present

## 2021-01-04 DIAGNOSIS — G4730 Sleep apnea, unspecified: Secondary | ICD-10-CM | POA: Diagnosis present

## 2021-01-04 DIAGNOSIS — Z7982 Long term (current) use of aspirin: Secondary | ICD-10-CM | POA: Diagnosis not present

## 2021-01-04 DIAGNOSIS — J449 Chronic obstructive pulmonary disease, unspecified: Secondary | ICD-10-CM | POA: Diagnosis present

## 2021-01-04 DIAGNOSIS — Z825 Family history of asthma and other chronic lower respiratory diseases: Secondary | ICD-10-CM

## 2021-01-04 DIAGNOSIS — Z95828 Presence of other vascular implants and grafts: Secondary | ICD-10-CM

## 2021-01-04 DIAGNOSIS — Z888 Allergy status to other drugs, medicaments and biological substances status: Secondary | ICD-10-CM

## 2021-01-04 DIAGNOSIS — Z8249 Family history of ischemic heart disease and other diseases of the circulatory system: Secondary | ICD-10-CM | POA: Diagnosis not present

## 2021-01-04 DIAGNOSIS — Z20822 Contact with and (suspected) exposure to covid-19: Secondary | ICD-10-CM | POA: Diagnosis present

## 2021-01-04 DIAGNOSIS — Z6832 Body mass index (BMI) 32.0-32.9, adult: Secondary | ICD-10-CM | POA: Diagnosis not present

## 2021-01-04 DIAGNOSIS — E669 Obesity, unspecified: Secondary | ICD-10-CM | POA: Diagnosis present

## 2021-01-04 DIAGNOSIS — I739 Peripheral vascular disease, unspecified: Secondary | ICD-10-CM

## 2021-01-04 DIAGNOSIS — T8141XA Infection following a procedure, superficial incisional surgical site, initial encounter: Secondary | ICD-10-CM | POA: Diagnosis present

## 2021-01-04 DIAGNOSIS — T827XXA Infection and inflammatory reaction due to other cardiac and vascular devices, implants and grafts, initial encounter: Secondary | ICD-10-CM

## 2021-01-04 DIAGNOSIS — Z8261 Family history of arthritis: Secondary | ICD-10-CM | POA: Diagnosis not present

## 2021-01-04 DIAGNOSIS — E785 Hyperlipidemia, unspecified: Secondary | ICD-10-CM | POA: Diagnosis present

## 2021-01-04 DIAGNOSIS — Z7902 Long term (current) use of antithrombotics/antiplatelets: Secondary | ICD-10-CM

## 2021-01-04 DIAGNOSIS — L03115 Cellulitis of right lower limb: Secondary | ICD-10-CM | POA: Diagnosis present

## 2021-01-04 DIAGNOSIS — Z79899 Other long term (current) drug therapy: Secondary | ICD-10-CM | POA: Diagnosis not present

## 2021-01-04 DIAGNOSIS — Z87891 Personal history of nicotine dependence: Secondary | ICD-10-CM

## 2021-01-04 DIAGNOSIS — Z791 Long term (current) use of non-steroidal anti-inflammatories (NSAID): Secondary | ICD-10-CM

## 2021-01-04 DIAGNOSIS — I10 Essential (primary) hypertension: Secondary | ICD-10-CM | POA: Diagnosis present

## 2021-01-04 DIAGNOSIS — Z833 Family history of diabetes mellitus: Secondary | ICD-10-CM | POA: Diagnosis not present

## 2021-01-04 DIAGNOSIS — Y832 Surgical operation with anastomosis, bypass or graft as the cause of abnormal reaction of the patient, or of later complication, without mention of misadventure at the time of the procedure: Secondary | ICD-10-CM | POA: Diagnosis present

## 2021-01-04 DIAGNOSIS — K219 Gastro-esophageal reflux disease without esophagitis: Secondary | ICD-10-CM | POA: Diagnosis present

## 2021-01-04 DIAGNOSIS — B951 Streptococcus, group B, as the cause of diseases classified elsewhere: Secondary | ICD-10-CM | POA: Diagnosis present

## 2021-01-04 DIAGNOSIS — A491 Streptococcal infection, unspecified site: Secondary | ICD-10-CM

## 2021-01-04 LAB — COMPREHENSIVE METABOLIC PANEL
ALT: 29 U/L (ref 0–44)
AST: 29 U/L (ref 15–41)
Albumin: 3.4 g/dL — ABNORMAL LOW (ref 3.5–5.0)
Alkaline Phosphatase: 76 U/L (ref 38–126)
Anion gap: 12 (ref 5–15)
BUN: 12 mg/dL (ref 8–23)
CO2: 23 mmol/L (ref 22–32)
Calcium: 9.1 mg/dL (ref 8.9–10.3)
Chloride: 94 mmol/L — ABNORMAL LOW (ref 98–111)
Creatinine, Ser: 0.82 mg/dL (ref 0.61–1.24)
GFR, Estimated: >60 mL/min (ref >60)
Glucose, Bld: 87 mg/dL (ref 70–99)
Potassium: 4.7 mmol/L (ref 3.5–5.1)
Sodium: 129 mmol/L — ABNORMAL LOW (ref 135–145)
Total Bilirubin: 0.8 mg/dL (ref 0.3–1.2)
Total Protein: 7.5 g/dL (ref 6.5–8.1)

## 2021-01-04 LAB — URINALYSIS, ROUTINE W REFLEX MICROSCOPIC
Bacteria, UA: NONE SEEN
Bilirubin Urine: NEGATIVE
Glucose, UA: NEGATIVE mg/dL
Ketones, ur: NEGATIVE mg/dL
Nitrite: NEGATIVE
Protein, ur: NEGATIVE mg/dL
Specific Gravity, Urine: 1.015 (ref 1.005–1.030)
WBC, UA: >50 WBC/hpf — ABNORMAL HIGH (ref 0–5)
pH: 6 (ref 5.0–8.0)

## 2021-01-04 LAB — PROTIME-INR
INR: 1 (ref 0.8–1.2)
Prothrombin Time: 13.6 seconds (ref 11.4–15.2)

## 2021-01-04 LAB — CBC
HCT: 33.2 % — ABNORMAL LOW (ref 39.0–52.0)
Hemoglobin: 11 g/dL — ABNORMAL LOW (ref 13.0–17.0)
MCH: 33.3 pg (ref 26.0–34.0)
MCHC: 33.1 g/dL (ref 30.0–36.0)
MCV: 100.6 fL — ABNORMAL HIGH (ref 80.0–100.0)
Platelets: 319 10*3/uL (ref 150–400)
RBC: 3.3 MIL/uL — ABNORMAL LOW (ref 4.22–5.81)
RDW: 16 % — ABNORMAL HIGH (ref 11.5–15.5)
WBC: 14.7 10*3/uL — ABNORMAL HIGH (ref 4.0–10.5)
nRBC: 0 % (ref 0.0–0.2)

## 2021-01-04 MED ORDER — SODIUM CHLORIDE 0.9% FLUSH
3.0000 mL | Freq: Two times a day (BID) | INTRAVENOUS | Status: DC
  Administered 2021-01-13: 3 mL via INTRAVENOUS

## 2021-01-04 MED ORDER — METOPROLOL TARTRATE 5 MG/5ML IV SOLN: 2.0000 mg | INTRAVENOUS | Status: DC | PRN

## 2021-01-04 MED ORDER — HEPARIN SODIUM (PORCINE) 5000 UNIT/ML IJ SOLN: 5000.0000 [IU] | Freq: Three times a day (TID) | INTRAMUSCULAR | Status: DC

## 2021-01-04 MED ORDER — ACETAMINOPHEN 325 MG PO TABS
325.0000 mg | ORAL_TABLET | ORAL | Status: DC | PRN
  Administered 2021-01-12 – 2021-01-13 (×2): 650 mg via ORAL
  Filled 2021-01-04 (×2): qty 2

## 2021-01-04 MED ORDER — NIACIN 100 MG PO TABS
100.0000 mg | ORAL_TABLET | Freq: Every day | ORAL | Status: DC
  Administered 2021-01-04 – 2021-01-14 (×10): 100 mg via ORAL
  Filled 2021-01-04 (×13): qty 1

## 2021-01-04 MED ORDER — ALUM & MAG HYDROXIDE-SIMETH 200-200-20 MG/5ML PO SUSP: 15.0000 mL | ORAL | Status: DC | PRN

## 2021-01-04 MED ORDER — SODIUM CHLORIDE 0.9% FLUSH
3.0000 mL | Freq: Two times a day (BID) | INTRAVENOUS | Status: DC
  Administered 2021-01-05 – 2021-01-12 (×4): 3 mL via INTRAVENOUS

## 2021-01-04 MED ORDER — TIOTROPIUM BROMIDE MONOHYDRATE 18 MCG IN CAPS: 18.0000 ug | ORAL_CAPSULE | Freq: Every day | RESPIRATORY_TRACT | Status: DC

## 2021-01-04 MED ORDER — PANTOPRAZOLE SODIUM 40 MG PO TBEC: 40.0000 mg | DELAYED_RELEASE_TABLET | Freq: Every day | ORAL | Status: DC

## 2021-01-04 MED ORDER — HYDRALAZINE HCL 20 MG/ML IJ SOLN: 5.0000 mg | INTRAMUSCULAR | Status: DC | PRN

## 2021-01-04 MED ORDER — SENNOSIDES-DOCUSATE SODIUM 8.6-50 MG PO TABS: 1.0000 | ORAL_TABLET | Freq: Every evening | ORAL | Status: DC | PRN

## 2021-01-04 MED ORDER — HYPROMELLOSE (GONIOSCOPIC) 2.5 % OP SOLN
1.0000 [drp] | Freq: Every day | OPHTHALMIC | Status: DC | PRN
  Filled 2021-01-04: qty 15

## 2021-01-04 MED ORDER — SODIUM CHLORIDE 0.9 % IV SOLN: 250.0000 mL | INTRAVENOUS | Status: DC | PRN

## 2021-01-04 MED ORDER — LABETALOL HCL 5 MG/ML IV SOLN
10.0000 mg | INTRAVENOUS | Status: DC | PRN
Start: 2021-01-04 — End: 2024-05-26

## 2021-01-04 MED ORDER — ONDANSETRON HCL 4 MG/2ML IJ SOLN: 4.0000 mg | Freq: Four times a day (QID) | INTRAMUSCULAR | Status: DC | PRN

## 2021-01-04 MED ORDER — THIAMINE HCL 100 MG PO TABS
100.0000 mg | ORAL_TABLET | Freq: Every day | ORAL | Status: DC
  Administered 2021-01-05 – 2021-01-15 (×11): 100 mg via ORAL
  Filled 2021-01-04 (×11): qty 1

## 2021-01-04 MED ORDER — SODIUM CHLORIDE 0.9% FLUSH: 3.0000 mL | INTRAVENOUS | Status: DC | PRN

## 2021-01-04 MED ORDER — ALBUTEROL SULFATE HFA 108 (90 BASE) MCG/ACT IN AERS: 2.0000 | INHALATION_SPRAY | Freq: Four times a day (QID) | RESPIRATORY_TRACT | Status: DC | PRN

## 2021-01-04 MED ORDER — CILOSTAZOL 50 MG PO TABS
100.0000 mg | ORAL_TABLET | Freq: Two times a day (BID) | ORAL | Status: DC
  Administered 2021-01-04 – 2021-01-15 (×22): 100 mg via ORAL
  Filled 2021-01-04 (×22): qty 2

## 2021-01-04 MED ORDER — OXYCODONE-ACETAMINOPHEN 5-325 MG PO TABS
1.0000 | ORAL_TABLET | ORAL | Status: DC | PRN
Start: 2021-01-04 — End: 2021-01-04

## 2021-01-04 MED ORDER — CEFAZOLIN SODIUM-DEXTROSE 2-4 GM/100ML-% IV SOLN
2.0000 g | Freq: Three times a day (TID) | INTRAVENOUS | Status: DC
  Administered 2021-01-04 – 2021-01-06 (×7): 2 g via INTRAVENOUS
  Filled 2021-01-04 (×8): qty 100

## 2021-01-04 MED ORDER — SODIUM CHLORIDE 0.9% FLUSH: 3.0000 mL | Freq: Two times a day (BID) | INTRAVENOUS | Status: DC

## 2021-01-04 MED ORDER — SPIRONOLACTONE 25 MG PO TABS
25.0000 mg | ORAL_TABLET | Freq: Every day | ORAL | Status: DC
  Administered 2021-01-05 – 2021-01-15 (×11): 25 mg via ORAL
  Filled 2021-01-04 (×11): qty 1

## 2021-01-04 MED ORDER — GUAIFENESIN-DM 100-10 MG/5ML PO SYRP: 15.0000 mL | ORAL_SOLUTION | ORAL | Status: DC | PRN

## 2021-01-04 MED ORDER — ACETAMINOPHEN 325 MG PO TABS
325.0000 mg | ORAL_TABLET | ORAL | Status: AC | PRN
Start: 2021-01-04 — End: ?

## 2021-01-04 MED ORDER — ALBUTEROL SULFATE (2.5 MG/3ML) 0.083% IN NEBU: 2.5000 mg | INHALATION_SOLUTION | Freq: Four times a day (QID) | RESPIRATORY_TRACT | Status: DC | PRN

## 2021-01-04 MED ORDER — LABETALOL HCL 5 MG/ML IV SOLN: 10.0000 mg | INTRAVENOUS | Status: DC | PRN

## 2021-01-04 MED ORDER — BENAZEPRIL HCL 5 MG PO TABS
10.0000 mg | ORAL_TABLET | Freq: Every day | ORAL | Status: DC
  Administered 2021-01-04 – 2021-01-14 (×11): 10 mg via ORAL
  Filled 2021-01-04 (×11): qty 2

## 2021-01-04 MED ORDER — HEPARIN SODIUM (PORCINE) 5000 UNIT/ML IJ SOLN
5000.0000 [IU] | Freq: Three times a day (TID) | INTRAMUSCULAR | Status: DC
  Administered 2021-01-04 – 2021-01-06 (×5): 5000 [IU] via SUBCUTANEOUS
  Filled 2021-01-04 (×5): qty 1

## 2021-01-04 MED ORDER — PANTOPRAZOLE SODIUM 40 MG PO TBEC
40.0000 mg | DELAYED_RELEASE_TABLET | Freq: Two times a day (BID) | ORAL | Status: DC
  Administered 2021-01-04 – 2021-01-15 (×22): 40 mg via ORAL
  Filled 2021-01-04 (×22): qty 1

## 2021-01-04 MED ORDER — DOCUSATE SODIUM 100 MG PO CAPS: 100.0000 mg | ORAL_CAPSULE | Freq: Two times a day (BID) | ORAL | Status: DC

## 2021-01-04 MED ORDER — UMECLIDINIUM BROMIDE 62.5 MCG/INH IN AEPB
1.0000 | INHALATION_SPRAY | Freq: Every day | RESPIRATORY_TRACT | Status: DC
  Administered 2021-01-05 – 2021-01-15 (×9): 1 via RESPIRATORY_TRACT
  Filled 2021-01-04 (×2): qty 7

## 2021-01-04 MED ORDER — ASPIRIN EC 81 MG PO TBEC
81.0000 mg | DELAYED_RELEASE_TABLET | Freq: Every day | ORAL | Status: DC
  Administered 2021-01-04 – 2021-01-14 (×11): 81 mg via ORAL
  Filled 2021-01-04 (×11): qty 1

## 2021-01-04 MED ORDER — POTASSIUM CHLORIDE CRYS ER 20 MEQ PO TBCR: 20.0000 meq | EXTENDED_RELEASE_TABLET | Freq: Once | ORAL | Status: DC

## 2021-01-04 MED ORDER — DOCUSATE SODIUM 100 MG PO CAPS
100.0000 mg | ORAL_CAPSULE | Freq: Two times a day (BID) | ORAL | Status: DC
  Administered 2021-01-05 – 2021-01-15 (×15): 100 mg via ORAL
  Filled 2021-01-04 (×19): qty 1

## 2021-01-04 MED ORDER — DOCUSATE SODIUM 50 MG PO CAPS: 100.0000 mg | ORAL_CAPSULE | Freq: Two times a day (BID) | ORAL | Status: AC

## 2021-01-04 MED ORDER — CEFAZOLIN SODIUM-DEXTROSE 2-4 GM/100ML-% IV SOLN
2.0000 g | Freq: Three times a day (TID) | INTRAVENOUS | Status: DC
  Filled 2021-01-04 (×2): qty 100

## 2021-01-04 MED ORDER — METOPROLOL SUCCINATE ER 100 MG PO TB24
200.0000 mg | ORAL_TABLET | Freq: Every day | ORAL | Status: DC
  Administered 2021-01-05 – 2021-01-15 (×10): 200 mg via ORAL
  Filled 2021-01-04 (×11): qty 2

## 2021-01-04 MED ORDER — ALUM & MAG HYDROXIDE-SIMETH 200-200-20 MG/5ML PO SUSP: 15.0000 mL | ORAL | Status: AC | PRN

## 2021-01-04 MED ORDER — ONDANSETRON HCL 4 MG/2ML IJ SOLN
4.0000 mg | Freq: Four times a day (QID) | INTRAMUSCULAR | Status: DC | PRN
  Filled 2021-01-04: qty 2

## 2021-01-04 MED ORDER — ACETAMINOPHEN 650 MG RE SUPP
325.0000 mg | RECTAL | Status: DC | PRN
Start: 2021-01-04 — End: 2021-01-15

## 2021-01-04 MED ORDER — BISACODYL 5 MG PO TBEC
5.0000 mg | DELAYED_RELEASE_TABLET | Freq: Every day | ORAL | Status: DC | PRN
  Administered 2021-01-14: 5 mg via ORAL
  Filled 2021-01-04: qty 1

## 2021-01-04 MED ORDER — PHENOL 1.4 % MT LIQD: 1.0000 | OROMUCOSAL | Status: AC | PRN

## 2021-01-04 MED ORDER — MOMETASONE FURO-FORMOTEROL FUM 200-5 MCG/ACT IN AERO
2.0000 | INHALATION_SPRAY | Freq: Two times a day (BID) | RESPIRATORY_TRACT | Status: DC
  Administered 2021-01-05 – 2021-01-15 (×19): 2 via RESPIRATORY_TRACT
  Filled 2021-01-04: qty 8.8

## 2021-01-04 MED ORDER — CARBOXYMETHYLCELLULOSE SODIUM 0.5 % OP SOLN: 1.0000 [drp] | Freq: Every day | OPHTHALMIC | Status: DC | PRN

## 2021-01-04 MED ORDER — ACETAMINOPHEN 80 MG RE SUPP: 325.0000 mg | RECTAL | Status: AC | PRN

## 2021-01-04 MED ORDER — ROSUVASTATIN CALCIUM 5 MG PO TABS
10.0000 mg | ORAL_TABLET | Freq: Every day | ORAL | Status: DC
  Administered 2021-01-05 – 2021-01-15 (×11): 10 mg via ORAL
  Filled 2021-01-04 (×11): qty 2

## 2021-01-04 MED ORDER — DEXTROSE 5 % IV SOLN: 2.0000 g | Freq: Three times a day (TID) | INTRAVENOUS | Status: DC

## 2021-01-04 MED ORDER — OXYCODONE-ACETAMINOPHEN 5-325 MG PO TABS
1.0000 | ORAL_TABLET | ORAL | Status: DC | PRN
Start: 2021-01-04 — End: 2021-01-15

## 2021-01-04 MED ORDER — OXYCODONE-ACETAMINOPHEN 5-325 MG PO TABS
1.0000 | ORAL_TABLET | ORAL | Status: DC | PRN
  Administered 2021-01-07: 1 via ORAL
  Administered 2021-01-09 – 2021-01-14 (×5): 2 via ORAL
  Filled 2021-01-04 (×8): qty 2

## 2021-01-04 MED ORDER — GUAIFENESIN-DM 100-10 MG/5ML PO SYRP: 15.0000 mL | ORAL_SOLUTION | ORAL | Status: AC | PRN

## 2021-01-04 NOTE — Addendum Note (Signed)
Addended by: Karoline Caldwell on: 01/04/2021 01:23 PM   Modules accepted: Orders, SmartSet

## 2021-01-04 NOTE — Progress Notes (Signed)
POST OPERATIVE OFFICE NOTE    CC:  F/u for surgery  HPI:  This is a 70 y.o. male who is s/p bilateral common femoral endarterectomies with bovine pericardial patch angioplasty and right to left femoral to femoral bypass with Dacron by Dr. Donzetta Matters on 11/04/20. He was just seen 10 days ago and was doing well. He was noted to have some perigraft fluid along proximal anastomosis on duplex. No signs of infection at that time. He unfortunately over past two days has developed redness, warmth and tenderness in the right groin and across super pubic region and into scrotum. He denies any fevers or chills. He otherwise is not having any lower extremity pain. He had been walking a lot more and doing really well. He even has had improvement of his lower extremity swelling.   The pt is on a statin for cholesterol management.  The pt is on a daily aspirin.   Other AC: Pletal,  The pt is on BB, Diuretic for hypertension.   The pt is not  diabetic.  Tobacco hx:  Former, quit 2014  Allergies  Allergen Reactions  . Amlodipine     Other reaction(s): Edema of lower extremity    Current Outpatient Medications  Medication Sig Dispense Refill  . albuterol (PROVENTIL HFA;VENTOLIN HFA) 108 (90 BASE) MCG/ACT inhaler Inhale 2 puffs into the lungs every 6 (six) hours as needed for wheezing or shortness of breath.    Marland Kitchen albuterol (PROVENTIL) (2.5 MG/3ML) 0.083% nebulizer solution Take 2.5 mg by nebulization every 6 (six) hours as needed for wheezing or shortness of breath.    Marland Kitchen aspirin EC 81 MG tablet Take 81 mg by mouth daily. Swallow whole.    . benazepril (LOTENSIN) 10 MG tablet Take 1 tablet (10 mg total) by mouth at bedtime. 90 tablet 3  . carboxymethylcellulose (REFRESH PLUS) 0.5 % SOLN Apply to eye.    . cholecalciferol (VITAMIN D) 1000 UNITS tablet Take 1,000 Units by mouth 2 (two) times daily.    . cilostazol (PLETAL) 100 MG tablet Take 100 mg by mouth 2 (two) times daily.    Marland Kitchen erythromycin ophthalmic  ointment Apply to eye.    . ferrous sulfate 325 (65 FE) MG tablet Take 1 tablet by mouth daily.    . Fluticasone-Salmeterol (ADVAIR) 250-50 MCG/DOSE AEPB Inhale 1 puff into the lungs 2 (two) times daily.    . folic acid (FOLVITE) 1 MG tablet Take 1 mg by mouth daily.    . hydrocortisone 2.5 % cream Apply 1 application topically 3 (three) times daily as needed (itching). rectally    . LACTOBACILLUS PO Take 2 capsules by mouth daily.    . Magnesium Oxide 420 MG TABS Take 420 mg by mouth daily.    . meloxicam (MOBIC) 15 MG tablet Take 15 mg by mouth daily.    . metoprolol (TOPROL-XL) 200 MG 24 hr tablet Take 200 mg by mouth daily.    . Multiple Vitamins-Minerals (MULTIVITAMIN WITH MINERALS) tablet Take 1 tablet by mouth daily.    . niacin 100 MG tablet Take 100 mg by mouth at bedtime.    . Omega-3 Fatty Acids (FISH OIL) 1000 MG CAPS Take 1,000 mg by mouth daily.    . pantoprazole (PROTONIX) 40 MG tablet Take 40 mg by mouth 2 (two) times daily.    . rosuvastatin (CRESTOR) 20 MG tablet Take 10 mg by mouth daily.    Marland Kitchen spironolactone (ALDACTONE) 25 MG tablet Take 1 tablet by mouth daily.    Marland Kitchen  thiamine 100 MG tablet Take 100 mg by mouth daily.    Marland Kitchen tiotropium (SPIRIVA) 18 MCG inhalation capsule Place 18 mcg into inhaler and inhale daily.     No current facility-administered medications for this visit.     ROS:  See HPI  Physical Exam:  Vitals:   01/04/21 1057  BP: 136/73  Pulse: 60  Resp: 20  Temp: 98.1 F (36.7 C)  TempSrc: Temporal  SpO2: 97%  Weight: 194 lb 1.6 oz (88 kg)  Height: 5\' 6"  (1.676 m)   General: well appearing, not in any acute distress Incision:  Left groin well healed, Right groin healed with medial incision dusky appearing    Erythema along right groin and suprapubic region extending into scrotum with induration and fluctuance. No drainage. Tender to palpation Extremities:  Well perfused and warm  Neuro: alert and oriented Abdomen:  Obese, distended, soft, non  tender  Assessment/Plan:  This is a 70 y.o. male who is s/p bilateral common femoral endarterectomies with bovine pericardial patch angioplasty and right to left femoral to femoral bypass with Dacron by Dr. Donzetta Matters on 11/04/20. He was just seen 10 days ago and was doing well. He was noted to have some perigraft fluid along proximal anastomosis on duplex. He has unfortunately developed redness, warmth and tenderness in the right groin and across super pubic region. He is afebrile. - Discussed with patient that hopefully this is superficial infection that will resolve with antibiotics but with prosthetic graft there is concerns of involvement and possible need for surgical exploration and or possible removal of the graft -Patient seen with Dr. Trula Slade and we feel it is best to have him admitted for IV antibiotics and will obtain CTA Abd/ Pelvis to see if signs of graft infection - He will be directly admitted to [facility] 4E once bed is available   Karoline Caldwell, PA-C [facility] [phone]  Clinic MD: Trula Slade

## 2021-01-05 ENCOUNTER — Inpatient Hospital Stay (HOSPITAL_COMMUNITY): Payer: No Typology Code available for payment source

## 2021-01-05 ENCOUNTER — Ambulatory Visit: Payer: No Typology Code available for payment source | Admitting: Cardiology

## 2021-01-05 MED ORDER — IOHEXOL 300 MG/ML  SOLN
75.0000 mL | Freq: Once | INTRAMUSCULAR | Status: AC | PRN
  Administered 2021-01-05: 75 mL via INTRAVENOUS

## 2021-01-05 NOTE — H&P (Signed)
H+P     HPI:  This is a 70 y.o. male who is s/pbilateral common femoral endarterectomies with bovine pericardial patch angioplasty and right to left femoral to femoral bypass with Dacron by Dr. Donzetta Matters on 11/04/20. He was just seen 10 days ago and was doing well. He was noted to have some perigraft fluid along proximal anastomosis on duplex. No signs of infection at that time. He unfortunately over past two days has developed redness, warmth and tenderness in the right groin and across super pubic region and into scrotum. He denies any fevers or chills. He otherwise is not having any lower extremity pain. He had been walking a lot more and doing really well. He even has had improvement of his lower extremity swelling.   The ptison a statin for cholesterol management.  The ptison a daily aspirin. Other AC: Pletal, The ptison BB, Diureticfor hypertension.  The ptis notdiabetic.  Tobacco UE:KCMKLK, quit 2014       Allergies  Allergen Reactions  . Amlodipine     Other reaction(s): Edema of lower extremity          Current Outpatient Medications  Medication Sig Dispense Refill  . albuterol (PROVENTIL HFA;VENTOLIN HFA) 108 (90 BASE) MCG/ACT inhaler Inhale 2 puffs into the lungs every 6 (six) hours as needed for wheezing or shortness of breath.    Marland Kitchen albuterol (PROVENTIL) (2.5 MG/3ML) 0.083% nebulizer solution Take 2.5 mg by nebulization every 6 (six) hours as needed for wheezing or shortness of breath.    Marland Kitchen aspirin EC 81 MG tablet Take 81 mg by mouth daily. Swallow whole.    . benazepril (LOTENSIN) 10 MG tablet Take 1 tablet (10 mg total) by mouth at bedtime. 90 tablet 3  . carboxymethylcellulose (REFRESH PLUS) 0.5 % SOLN Apply to eye.    . cholecalciferol (VITAMIN D) 1000 UNITS tablet Take 1,000 Units by mouth 2 (two) times daily.    . cilostazol (PLETAL) 100 MG tablet Take 100 mg by mouth 2 (two) times daily.    Marland Kitchen erythromycin ophthalmic ointment  Apply to eye.    . ferrous sulfate 325 (65 FE) MG tablet Take 1 tablet by mouth daily.    . Fluticasone-Salmeterol (ADVAIR) 250-50 MCG/DOSE AEPB Inhale 1 puff into the lungs 2 (two) times daily.    . folic acid (FOLVITE) 1 MG tablet Take 1 mg by mouth daily.    . hydrocortisone 2.5 % cream Apply 1 application topically 3 (three) times daily as needed (itching). rectally    . LACTOBACILLUS PO Take 2 capsules by mouth daily.    . Magnesium Oxide 420 MG TABS Take 420 mg by mouth daily.    . meloxicam (MOBIC) 15 MG tablet Take 15 mg by mouth daily.    . metoprolol (TOPROL-XL) 200 MG 24 hr tablet Take 200 mg by mouth daily.    . Multiple Vitamins-Minerals (MULTIVITAMIN WITH MINERALS) tablet Take 1 tablet by mouth daily.    . niacin 100 MG tablet Take 100 mg by mouth at bedtime.    . Omega-3 Fatty Acids (FISH OIL) 1000 MG CAPS Take 1,000 mg by mouth daily.    . pantoprazole (PROTONIX) 40 MG tablet Take 40 mg by mouth 2 (two) times daily.    . rosuvastatin (CRESTOR) 20 MG tablet Take 10 mg by mouth daily.    Marland Kitchen spironolactone (ALDACTONE) 25 MG tablet Take 1 tablet by mouth daily.    Marland Kitchen thiamine 100 MG tablet Take 100 mg by mouth  daily.    . tiotropium (SPIRIVA) 18 MCG inhalation capsule Place 18 mcg into inhaler and inhale daily.     No current facility-administered medications for this visit.     ROS:  See HPI  Physical Exam:     Vitals:   01/04/21 1057  BP: 136/73  Pulse: 60  Resp: 20  Temp: 98.1 F (36.7 C)  TempSrc: Temporal  SpO2: 97%  Weight: 194 lb 1.6 oz (88 kg)  Height: 5\' 6"  (1.676 m)   General: well appearing, not in any acute distress Incision:  Left groin well healed, Right groin healed with medial incision dusky appearing    Erythema along right groin and suprapubic region extending into scrotum with induration and fluctuance. No drainage. Tender to palpation Extremities:  Well perfused and warm  Neuro: alert and  oriented Abdomen:  Obese, distended, soft, non tender  Assessment/Plan:  This is a 70 y.o. male who is s/pbilateral common femoral endarterectomies with bovine pericardial patch angioplasty and right to left femoral to femoral bypass with Dacron by Dr. Donzetta Matters on 11/04/20. He was just seen 10 days ago and was doing well. He was noted to have some perigraft fluid along proximal anastomosis on duplex. He has unfortunately developed redness, warmth and tenderness in the right groin and across super pubic region. He is afebrile. - Discussed with patient that hopefully this is superficial infection that will resolve with antibiotics but with prosthetic graft there is concerns of involvement and possible need for surgical exploration and or possible removal of the graft -Patient seen with Dr. Trula Slade and we feel it is best to have him admitted for IV antibiotics and will obtain CTA Abd/ Pelvis to see if signs of graft infection - He will be directly admitted to [facility] 4E once bed is available  Lorae Roig C. Donzetta Matters, MD [facility] [place] Office: [phone] Pager: [phone]

## 2021-01-05 NOTE — Progress Notes (Addendum)
[facility] [place]  Subjective  - B groins with edema and erythema.   Objective (!) 109/53 81 98.1 F (36.7 C) (Oral) 20 93%  Intake/Output Summary (Last 24 hours) at 01/05/2021 0722 Last data filed at 01/05/2021 0649 Gross per 24 hour  Intake 640 ml  Output 1080 ml  Net -440 ml    Erythema B groins with edema.  No active drainage.  Incisions intact. Lungs non labored breathing +-------+-----------+-----------+------------+------------+  ABI/TBIToday's ABIToday's TBIPrevious ABIPrevious TBI  +-------+-----------+-----------+------------+------------+  Right 0.80    0.57    0.84    0.58      +-------+-----------+-----------+------------+------------+  Left  0.90    0.60    0.64    0.39      +-------+-----------+-----------+------------+------------+   Assessment/Planning: S/P B femoral endarterectomy with fem-fem bypass Admitted with leukocytosis, afebrile.  He has been started on IV antibiotics Ancef for cellulitis.    CTA ordered abdomin and pelvis  Leukocytosis 14.7   Roxy Horseman 01/05/2021 7:22 AM --  Laboratory Lab Results: Recent Labs    01/04/21 1638  WBC 14.7*  HGB 11.0*  HCT 33.2*  PLT 319   BMET Recent Labs    01/04/21 1638  NA 129*  K 4.7  CL 94*  CO2 23  GLUCOSE 87  BUN 12  CREATININE 0.82  CALCIUM 9.1    COAG Lab Results  Component Value Date   INR 1.0 01/04/2021   INR 1.0 10/30/2020   No results found for: PTT  I have independently interviewed and examined patient and agree with PA assessment and plan above. Will get CT scan and determine best course of action. I discussed with patient possible need for graft excision.   Eliyanah Elgersma C. Donzetta Matters, MD [facility] [place] Office: [phone] Pager: [phone]

## 2021-01-06 ENCOUNTER — Inpatient Hospital Stay (HOSPITAL_COMMUNITY): Payer: No Typology Code available for payment source | Admitting: Anesthesiology

## 2021-01-06 ENCOUNTER — Encounter (HOSPITAL_COMMUNITY): Payer: Self-pay | Admitting: Vascular Surgery

## 2021-01-06 ENCOUNTER — Encounter (HOSPITAL_COMMUNITY)
Admission: AD | Disposition: A | Discharge status: Home / Self Care | Payer: Self-pay | Source: Ambulatory Visit | Attending: Vascular Surgery

## 2021-01-06 DIAGNOSIS — T8141XA Infection following a procedure, superficial incisional surgical site, initial encounter: Secondary | ICD-10-CM

## 2021-01-06 DIAGNOSIS — Y832 Surgical operation with anastomosis, bypass or graft as the cause of abnormal reaction of the patient, or of later complication, without mention of misadventure at the time of the procedure: Secondary | ICD-10-CM

## 2021-01-06 HISTORY — PX: INCISION AND DRAINAGE OF WOUND: SHX1803

## 2021-01-06 LAB — SARS CORONAVIRUS 2 BY RT PCR (HOSPITAL ORDER, PERFORMED IN ~~LOC~~ HOSPITAL LAB): SARS Coronavirus 2: NEGATIVE

## 2021-01-06 SURGERY — IRRIGATION AND DEBRIDEMENT WOUND
Anesthesia: General | Site: Groin | Laterality: Right

## 2021-01-06 MED ORDER — FENTANYL CITRATE (PF) 250 MCG/5ML IJ SOLN
INTRAMUSCULAR | Status: DC | PRN
  Administered 2021-01-06: 25 ug via INTRAVENOUS
  Administered 2021-01-06: 50 ug via INTRAVENOUS
  Administered 2021-01-06: 25 ug via INTRAVENOUS

## 2021-01-06 MED ORDER — PROMETHAZINE HCL 25 MG/ML IJ SOLN: 6.2500 mg | INTRAMUSCULAR | Status: DC | PRN

## 2021-01-06 MED ORDER — PIPERACILLIN-TAZOBACTAM 3.375 G IVPB
3.3750 g | Freq: Three times a day (TID) | INTRAVENOUS | Status: DC
  Filled 2021-01-06: qty 50

## 2021-01-06 MED ORDER — CHLORHEXIDINE GLUCONATE 0.12 % MT SOLN
OROMUCOSAL | Status: AC
  Administered 2021-01-06: 15 mL via OROMUCOSAL
  Filled 2021-01-06: qty 15

## 2021-01-06 MED ORDER — GENTAMICIN SULFATE 40 MG/ML IJ SOLN
INTRAMUSCULAR | Status: DC | PRN
  Administered 2021-01-06: 120 mg

## 2021-01-06 MED ORDER — VANCOMYCIN HCL 1750 MG/350ML IV SOLN
1750.0000 mg | Freq: Once | INTRAVENOUS | Status: AC
  Administered 2021-01-06: 1750 mg via INTRAVENOUS
  Filled 2021-01-06: qty 350

## 2021-01-06 MED ORDER — MIDAZOLAM HCL 2 MG/2ML IJ SOLN
INTRAMUSCULAR | Status: AC
  Filled 2021-01-06: qty 2

## 2021-01-06 MED ORDER — ONDANSETRON HCL 4 MG/2ML IJ SOLN
INTRAMUSCULAR | Status: DC | PRN
  Administered 2021-01-06: 4 mg via INTRAVENOUS

## 2021-01-06 MED ORDER — FENTANYL CITRATE (PF) 100 MCG/2ML IJ SOLN
INTRAMUSCULAR | Status: AC
  Filled 2021-01-06: qty 2

## 2021-01-06 MED ORDER — HEPARIN SODIUM (PORCINE) 5000 UNIT/ML IJ SOLN
5000.0000 [IU] | Freq: Three times a day (TID) | INTRAMUSCULAR | Status: DC
  Administered 2021-01-06 – 2021-01-15 (×24): 5000 [IU] via SUBCUTANEOUS
  Filled 2021-01-06 (×24): qty 1

## 2021-01-06 MED ORDER — PIPERACILLIN-TAZOBACTAM 3.375 G IVPB
3.3750 g | Freq: Three times a day (TID) | INTRAVENOUS | Status: DC
  Administered 2021-01-06 – 2021-01-10 (×11): 3.375 g via INTRAVENOUS
  Filled 2021-01-06 (×12): qty 50

## 2021-01-06 MED ORDER — 0.9 % SODIUM CHLORIDE (POUR BTL) OPTIME
TOPICAL | Status: DC | PRN
  Administered 2021-01-06: 1000 mL

## 2021-01-06 MED ORDER — CHLORHEXIDINE GLUCONATE 0.12 % MT SOLN: 15.0000 mL | Freq: Once | OROMUCOSAL | Status: AC

## 2021-01-06 MED ORDER — PROPOFOL 10 MG/ML IV BOLUS
INTRAVENOUS | Status: AC
  Filled 2021-01-06: qty 20

## 2021-01-06 MED ORDER — LACTATED RINGERS IV SOLN: INTRAVENOUS | Status: DC

## 2021-01-06 MED ORDER — DEXAMETHASONE SODIUM PHOSPHATE 10 MG/ML IJ SOLN
INTRAMUSCULAR | Status: DC | PRN
  Administered 2021-01-06: 5 mg via INTRAVENOUS

## 2021-01-06 MED ORDER — PROPOFOL 10 MG/ML IV BOLUS
INTRAVENOUS | Status: DC | PRN
  Administered 2021-01-06: 180 mg via INTRAVENOUS

## 2021-01-06 MED ORDER — SODIUM CHLORIDE 0.9 % IR SOLN
Status: DC | PRN
  Administered 2021-01-06: 3000 mL

## 2021-01-06 MED ORDER — VANCOMYCIN HCL 750 MG/150ML IV SOLN
750.0000 mg | Freq: Two times a day (BID) | INTRAVENOUS | Status: DC
  Administered 2021-01-07 – 2021-01-10 (×7): 750 mg via INTRAVENOUS
  Filled 2021-01-06 (×7): qty 150

## 2021-01-06 MED ORDER — ORAL CARE MOUTH RINSE: 15.0000 mL | Freq: Once | OROMUCOSAL | Status: AC

## 2021-01-06 MED ORDER — PHENYLEPHRINE HCL-NACL 10-0.9 MG/250ML-% IV SOLN
INTRAVENOUS | Status: DC | PRN
  Administered 2021-01-06: 40 ug/min via INTRAVENOUS

## 2021-01-06 MED ORDER — FENTANYL CITRATE (PF) 250 MCG/5ML IJ SOLN
INTRAMUSCULAR | Status: AC
  Filled 2021-01-06: qty 5

## 2021-01-06 MED ORDER — VANCOMYCIN HCL 1000 MG IV SOLR
INTRAVENOUS | Status: AC
  Filled 2021-01-06: qty 1000

## 2021-01-06 MED ORDER — VANCOMYCIN HCL 1000 MG IV SOLR
INTRAVENOUS | Status: DC | PRN
  Administered 2021-01-06: 1000 mg via TOPICAL

## 2021-01-06 MED ORDER — ACETAMINOPHEN 10 MG/ML IV SOLN: 1000.0000 mg | Freq: Once | INTRAVENOUS | Status: DC | PRN

## 2021-01-06 MED ORDER — MORPHINE SULFATE (PF) 2 MG/ML IV SOLN: 2.0000 mg | INTRAVENOUS | Status: DC | PRN

## 2021-01-06 MED ORDER — PHENYLEPHRINE 40 MCG/ML (10ML) SYRINGE FOR IV PUSH (FOR BLOOD PRESSURE SUPPORT)
PREFILLED_SYRINGE | INTRAVENOUS | Status: DC | PRN
  Administered 2021-01-06 (×2): 120 ug via INTRAVENOUS

## 2021-01-06 MED ORDER — MIDAZOLAM HCL 5 MG/5ML IJ SOLN
INTRAMUSCULAR | Status: DC | PRN
  Administered 2021-01-06: 1 mg via INTRAVENOUS

## 2021-01-06 MED ORDER — LIDOCAINE 2% (20 MG/ML) 5 ML SYRINGE
INTRAMUSCULAR | Status: DC | PRN
  Administered 2021-01-06: 100 mg via INTRAVENOUS

## 2021-01-06 MED ORDER — GENTAMICIN SULFATE 40 MG/ML IJ SOLN
INTRAMUSCULAR | Status: AC
  Filled 2021-01-06: qty 4

## 2021-01-06 MED ORDER — FENTANYL CITRATE (PF) 100 MCG/2ML IJ SOLN
25.0000 ug | INTRAMUSCULAR | Status: DC | PRN
  Administered 2021-01-06 (×2): 25 ug via INTRAVENOUS

## 2021-01-06 SURGICAL SUPPLY — 40 items
BNDG ELASTIC 4X5.8 VLCR STR LF (GAUZE/BANDAGES/DRESSINGS) IMPLANT
BNDG ELASTIC 6X5.8 VLCR STR LF (GAUZE/BANDAGES/DRESSINGS) IMPLANT
BNDG GAUZE ELAST 4 BULKY (GAUZE/BANDAGES/DRESSINGS) ×1 IMPLANT
CANISTER SUCT 3000ML PPV (MISCELLANEOUS) ×2 IMPLANT
CLIP VESOCCLUDE MED 6/CT (CLIP) ×2 IMPLANT
CLIP VESOCCLUDE SM WIDE 6/CT (CLIP) ×2 IMPLANT
COVER SURGICAL LIGHT HANDLE (MISCELLANEOUS) ×2 IMPLANT
DRAPE HALF SHEET 40X57 (DRAPES) ×1 IMPLANT
DRAPE U-SHAPE 76X120 STRL (DRAPES) IMPLANT
DRSG PAD ABDOMINAL 8X10 ST (GAUZE/BANDAGES/DRESSINGS) ×1 IMPLANT
ELECT REM PT RETURN 9FT ADLT (ELECTROSURGICAL) ×2
ELECTRODE REM PT RTRN 9FT ADLT (ELECTROSURGICAL) ×1 IMPLANT
GAUZE SPONGE 4X4 12PLY STRL (GAUZE/BANDAGES/DRESSINGS) ×2 IMPLANT
GAUZE XEROFORM 5X9 LF (GAUZE/BANDAGES/DRESSINGS) IMPLANT
GLOVE BIOGEL PI IND STRL 7.5 (GLOVE) ×1 IMPLANT
GLOVE BIOGEL PI INDICATOR 7.5 (GLOVE) ×1
GLOVE SURG POLYISO LF SZ7.5 (GLOVE) ×2 IMPLANT
GOWN STRL REUS W/ TWL LRG LVL3 (GOWN DISPOSABLE) ×2 IMPLANT
GOWN STRL REUS W/ TWL XL LVL3 (GOWN DISPOSABLE) ×1 IMPLANT
GOWN STRL REUS W/TWL LRG LVL3 (GOWN DISPOSABLE) ×2
GOWN STRL REUS W/TWL XL LVL3 (GOWN DISPOSABLE) ×1
IV NS IRRIG 3000ML ARTHROMATIC (IV SOLUTION) ×2 IMPLANT
KIT BASIN OR (CUSTOM PROCEDURE TRAY) ×2 IMPLANT
KIT STIMULAN RAPID CURE 5CC (Orthopedic Implant) ×1 IMPLANT
KIT TURNOVER KIT B (KITS) ×2 IMPLANT
NS IRRIG 1000ML POUR BTL (IV SOLUTION) ×2 IMPLANT
PACK CV ACCESS (CUSTOM PROCEDURE TRAY) IMPLANT
PACK GENERAL/GYN (CUSTOM PROCEDURE TRAY) ×2 IMPLANT
PACK UNIVERSAL I (CUSTOM PROCEDURE TRAY) ×2 IMPLANT
PAD ARMBOARD 7.5X6 YLW CONV (MISCELLANEOUS) ×4 IMPLANT
PULSAVAC PLUS IRRIG FAN TIP (DISPOSABLE) ×2
SUT ETHILON 3 0 PS 1 (SUTURE) IMPLANT
SUT VIC AB 2-0 CTX 36 (SUTURE) IMPLANT
SUT VIC AB 3-0 SH 27 (SUTURE)
SUT VIC AB 3-0 SH 27X BRD (SUTURE) IMPLANT
SUT VICRYL 4-0 PS2 18IN ABS (SUTURE) IMPLANT
TAPE CLOTH 4X10 WHT NS (GAUZE/BANDAGES/DRESSINGS) ×1 IMPLANT
TIP FAN IRRIG PULSAVAC PLUS (DISPOSABLE) IMPLANT
TOWEL GREEN STERILE (TOWEL DISPOSABLE) ×2 IMPLANT
WATER STERILE IRR 1000ML POUR (IV SOLUTION) ×2 IMPLANT

## 2021-01-06 NOTE — Progress Notes (Signed)
Pharmacy Antibiotic Note  Sean [name] is a 70 y.o. male admitted on 01/04/2021 with cellulitis and CT noted with possible abscess.  Pharmacy has been consulted for vancomycin dosing. Plans are to start vancomycin post OR -WBC= 14.7, afeb -SCr= 0.8   Plan: -After OR, give a total dose of vancomycin 1750mg  IV x1 followed by 750mg  IV q12h (estimated AUC= 482) -Will follow renal function, cultures and clinical progress   Weight: 88 kg (194 lb 1.6 oz)  Temp (24hrs), Avg:97.8 F (36.6 C), Min:97.6 F (36.4 C), Max:98 F (36.7 C)  Recent Labs  Lab 01/04/21 1638  WBC 14.7*  CREATININE 0.82    Estimated Creatinine Clearance: 89.6 mL/min (by C-G formula based on SCr of 0.82 mg/dL).    Allergies  Allergen Reactions  . Amlodipine Swelling and Other (See Comments)    Edema of lower extremities    Antimicrobials this admission: 5/23 ancef>> 5/25 vanc>>  Dose adjustments this admission:   Microbiology results:   Thank you for allowing pharmacy to be a part of this patient's care.  Hildred Laser, PharmD Clinical Pharmacist **Pharmacist phone directory can now be found on [email or web address] ([email or web address]).  Listed under [place].

## 2021-01-06 NOTE — Op Note (Signed)
    Patient name: Hunt Zajicek MRN: [id number] DOB: 08/02/1951 Sex: male  01/06/2021 Pre-operative Diagnosis: Right groin infection Post-operative diagnosis:  Same Surgeon:  Annamarie Major Assistants: Arlee Muslim, PA Procedure:   #1: I&D of right groin   #2: Placement of antibiotic impregnated beads Anesthesia: General Blood Loss: 100 cc Specimens: Anaerobic and aerobic cultures were sent  Findings: Upon opening the incision approximately 30 cc of purulent drainage was expressed.  The anterior portion of the dacryon femoral-femoral graft was exposed.  The posterior portion of the graft appeared to be incorporated.  I traced this back to the bovine patch in the right groin which was not exposed and incorporated.  Necrotic subcutaneous tissue was sharply debrided.  The wound was irrigated with 3 L of saline using the Pulsavac, and antibiotic impregnated beads were placed in the wound along with Betadine soaked gauze.  Indications: The patient has previously undergone bilateral femoral endarterectomy and femoral-femoral bypass graft.  He recently developed erythema over his right groin incision.  CT scan showed a fluid collection.  He comes in today for operative exploration.  Procedure:  The patient was identified in the holding area and taken to [place] [facility]  The patient was then placed supine on the table. general anesthesia was administered.  The patient was prepped and draped in the usual sterile fashion.  A time out was called and antibiotics were administered.  A PA was necessary to expedite the procedure and assist with technical details.  The previous oblique right groin incision was opened with a 10 blade.  Upon entry into the subcutaneous tissue, approximately 30 cc of purulent discharge was expressed.  Anaerobic and aerobic cultures were sent.  We then further explored the wound.  A tunnel tract down to the anterior surface of the previously placed dacryon graft.  The posterior portion of  the graft appeared to be incorporated.  The graft was exposed for approximately 3 cm and was able to be traced back to the anastomosis in the right groin which was well incorporated.  There was also a tract that went down towards the scrotum that was opened up.  A 10 blade was then used to debride the subcutaneous tissue including fat, skin and fascia.  Next 3 L of irrigation was performed with the Pulsavac.  The wound was then inspected it appeared to be clean at this time.  Antibiotic impregnated beads with vancomycin and gentamicin were placed into the wound followed by Betadine soaked gauze.  Sterile dressings were applied.  There were no immediate complications.   Disposition: To PACU stable.   Theotis Burrow, M.D., [facility] [place] Office: [phone] Pager:  [phone]

## 2021-01-06 NOTE — Transfer of Care (Signed)
Immediate Anesthesia Transfer of Care Note  Patient: [name]  Procedure(s) Performed: IRRIGATION AND DEBRIDEMENT GROIN (Right Groin)  Patient Location: PACU  Anesthesia Type:General  Level of Consciousness: drowsy  Airway & Oxygen Therapy: Patient Spontanous Breathing and Patient connected to face mask oxygen  Post-op Assessment: Report given to RN and Post -op Vital signs reviewed and stable  Post vital signs: Reviewed and stable  Last Vitals:  Vitals Value Taken Time  BP 145/78 01/06/21 1545  Temp 36.2 C 01/06/21 1530  Pulse 72 01/06/21 1546  Resp 19 01/06/21 1546  SpO2 90 % 01/06/21 1546  Vitals shown include unvalidated device data.  Last Pain:  Vitals:   01/06/21 1535  TempSrc:   PainSc: 5       Patients Stated Pain Goal: 1 (85/46/27 0350)  Complications: No complications documented.

## 2021-01-06 NOTE — Progress Notes (Signed)
Pt arrived back to room from PACU.  VSS. drsg on right groin CDI.

## 2021-01-06 NOTE — Anesthesia Procedure Notes (Signed)
Procedure Name: LMA Insertion Date/Time: 01/06/2021 2:29 PM Performed by: Imagene Riches, CRNA Pre-anesthesia Checklist: Patient identified, Emergency Drugs available, Suction available and Patient being monitored Patient Re-evaluated:Patient Re-evaluated prior to induction Oxygen Delivery Method: Circle System Utilized Preoxygenation: Pre-oxygenation with 100% oxygen Induction Type: IV induction Ventilation: Mask ventilation without difficulty LMA: LMA inserted LMA Size: 4.0 Number of attempts: 1 Airway Equipment and Method: Bite block Placement Confirmation: positive ETCO2 Tube secured with: Tape Dental Injury: Teeth and Oropharynx as per pre-operative assessment

## 2021-01-06 NOTE — Progress Notes (Addendum)
  Progress Note    01/06/2021 7:13 AM Hospital Day 2  Subjective:  Sleeping and wakes easily.  Says his right groin is tender  afebrile  Vitals:   01/05/21 2341 01/06/21 0341  BP: (!) 148/81 (!) 133/94  Pulse: 76 98  Resp: 18 18  Temp: 97.6 F (36.4 C) 98 F (36.7 C)  SpO2: 100% 95%    Physical Exam: General:  No distress Lungs:  Non labored Incisions:  Left groin well healed.  Right groin with erythema and moist with tenderness to palpation over right groin and suprapubic area.  Extremities:  Bilateral feet are warm and well perfused.   CBC    Component Value Date/Time   WBC 14.7 (H) 01/04/2021 1638   RBC 3.30 (L) 01/04/2021 1638   HGB 11.0 (L) 01/04/2021 1638   HCT 33.2 (L) 01/04/2021 1638   PLT 319 01/04/2021 1638   MCV 100.6 (H) 01/04/2021 1638   MCH 33.3 01/04/2021 1638   MCHC 33.1 01/04/2021 1638   RDW 16.0 (H) 01/04/2021 1638    BMET    Component Value Date/Time   NA 129 (L) 01/04/2021 1638   NA 134 10/13/2020 0741   K 4.7 01/04/2021 1638   CL 94 (L) 01/04/2021 1638   CO2 23 01/04/2021 1638   GLUCOSE 87 01/04/2021 1638   BUN 12 01/04/2021 1638   BUN 11 10/13/2020 0741   CREATININE 0.82 01/04/2021 1638   CALCIUM 9.1 01/04/2021 1638   GFRNONAA >60 01/04/2021 1638    INR    Component Value Date/Time   INR 1.0 01/04/2021 1638     Intake/Output Summary (Last 24 hours) at 01/06/2021 0713 Last data filed at 01/06/2021 0310 Gross per 24 hour  Intake 920 ml  Output 865 ml  Net 55 ml    CT Abdomen/Pelvis 01/05/2021: IMPRESSION: 1. Right groin fluid collection about the right aspect of femoral femoral bypass graft measuring 4.9 x 1.9 x 3.9 cm. Collection is thick walled with adjacent inflammatory changes extending into the subcutaneous tissues of the upper scrotum. While there is no internal air, findings are suspicious for abscess. 2. Patent left groin anastomosis. 3. Prominent bilateral inguinal and external iliac lymph nodes are typically  reactive.   Assessment/Plan:  70 y.o. male who is s/pbilateral common femoral endarterectomies with bovine pericardial patch angioplasty and right to left femoral to femoral bypass with Dacron by Dr. Donzetta Matters on 11/04/20 readmitted with possible graft infection Hospital Day 2  -pt still with erythema and tenderness over right groin and suprapubic area.  CT scan reveals fluid collection around right aspect of fem fem bypass measuring 4.9 x 1.9 x 3.9 and extends into the subcu tissue of the upper scrotum and suspicious for abscess.  Dr. Donzetta Matters to review scan.  I have asked the pt not to eat or drink anything until Dr. Donzetta Matters has reviewed the scan made the pt npo as he may have to have his graft removed today.    Leontine Locket, PA-C [facility] [phone] 01/06/2021 7:13 AM  I have independently interviewed and examined patient and agree with PA assessment and plan above. Plan to debride right groin today in OR. I have discussed with him the grim nature of this infection and the need for further procedures to attempt salvage of this graft and his bilateral patch angioplasties.   Anevay Campanella C. Donzetta Matters, MD [facility] [place] Office: [phone] Pager: [phone]

## 2021-01-06 NOTE — Anesthesia Preprocedure Evaluation (Signed)
Anesthesia Evaluation  Patient identified by MRN, date of birth, ID band Patient awake    Reviewed: Allergy & Precautions, NPO status , Patient's Chart, lab work & pertinent test results  Airway Mallampati: III  TM Distance: >3 FB Neck ROM: Full    Dental  (+) Teeth Intact   Pulmonary sleep apnea , COPD,  COPD inhaler, former smoker,    Pulmonary exam normal        Cardiovascular hypertension, Pt. on medications and Pt. on home beta blockers + Peripheral Vascular Disease   Rhythm:Regular Rate:Normal     Neuro/Psych Anxiety negative neurological ROS     GI/Hepatic GERD  ,  Endo/Other  negative endocrine ROS  Renal/GU negative Renal ROS   Groin infection    Musculoskeletal  (+) Arthritis ,   Abdominal (+)  Abdomen: soft. Bowel sounds: normal.  Peds  Hematology negative hematology ROS (+)   Anesthesia Other Findings   Reproductive/Obstetrics                             Anesthesia Physical Anesthesia Plan  ASA: III  Anesthesia Plan: General   Post-op Pain Management:    Induction: Intravenous  PONV Risk Score and Plan: 2 and Ondansetron, Dexamethasone, Midazolam and Treatment may vary due to age or medical condition  Airway Management Planned: Mask and LMA  Additional Equipment: None  Intra-op Plan:   Post-operative Plan: Extubation in OR  Informed Consent: I have reviewed the patients History and Physical, chart, labs and discussed the procedure including the risks, benefits and alternatives for the proposed anesthesia with the patient or authorized representative who has indicated his/her understanding and acceptance.     Dental advisory given  Plan Discussed with: CRNA  Anesthesia Plan Comments: (Lab Results      Component                Value               Date                      WBC                      14.7 (H)            01/04/2021                HGB                       11.0 (L)            01/04/2021                HCT                      33.2 (L)            01/04/2021                MCV                      100.6 (H)           01/04/2021                PLT                      319  01/04/2021           Lab Results      Component                Value               Date                      NA                       129 (L)             01/04/2021                K                        4.7                 01/04/2021                CO2                      23                  01/04/2021                GLUCOSE                  87                  01/04/2021                BUN                      12                  01/04/2021                CREATININE               0.82                01/04/2021                CALCIUM                  9.1                 01/04/2021                GFRNONAA                 >60                 01/04/2021          )        Anesthesia Quick Evaluation

## 2021-01-06 NOTE — Interval H&P Note (Signed)
History and Physical Interval Note:  01/06/2021 1:47 PM  Sean [name]  has presented today for surgery, with the diagnosis of INFECTION.  The various methods of treatment have been discussed with the patient and family. After consideration of risks, benefits and other options for treatment, the patient has consented to  Procedure(s): IRRIGATION AND DEBRIDEMENT GROIN (Right) as a surgical intervention.  The patient's history has been reviewed, patient examined, no change in status, stable for surgery.  I have reviewed the patient's chart and labs.  Questions were answered to the patient's satisfaction.     Annamarie Major

## 2021-01-06 NOTE — H&P (View-Only) (Signed)
  Progress Note    01/06/2021 7:13 AM Hospital Day 2  Subjective:  Sleeping and wakes easily.  Says his right groin is tender  afebrile  Vitals:   01/05/21 2341 01/06/21 0341  BP: (!) 148/81 (!) 133/94  Pulse: 76 98  Resp: 18 18  Temp: 97.6 F (36.4 C) 98 F (36.7 C)  SpO2: 100% 95%    Physical Exam: General:  No distress Lungs:  Non labored Incisions:  Left groin well healed.  Right groin with erythema and moist with tenderness to palpation over right groin and suprapubic area.  Extremities:  Bilateral feet are warm and well perfused.   CBC    Component Value Date/Time   WBC 14.7 (H) 01/04/2021 1638   RBC 3.30 (L) 01/04/2021 1638   HGB 11.0 (L) 01/04/2021 1638   HCT 33.2 (L) 01/04/2021 1638   PLT 319 01/04/2021 1638   MCV 100.6 (H) 01/04/2021 1638   MCH 33.3 01/04/2021 1638   MCHC 33.1 01/04/2021 1638   RDW 16.0 (H) 01/04/2021 1638    BMET    Component Value Date/Time   NA 129 (L) 01/04/2021 1638   NA 134 10/13/2020 0741   K 4.7 01/04/2021 1638   CL 94 (L) 01/04/2021 1638   CO2 23 01/04/2021 1638   GLUCOSE 87 01/04/2021 1638   BUN 12 01/04/2021 1638   BUN 11 10/13/2020 0741   CREATININE 0.82 01/04/2021 1638   CALCIUM 9.1 01/04/2021 1638   GFRNONAA >60 01/04/2021 1638    INR    Component Value Date/Time   INR 1.0 01/04/2021 1638     Intake/Output Summary (Last 24 hours) at 01/06/2021 0713 Last data filed at 01/06/2021 0310 Gross per 24 hour  Intake 920 ml  Output 865 ml  Net 55 ml    CT Abdomen/Pelvis 01/05/2021: IMPRESSION: 1. Right groin fluid collection about the right aspect of femoral femoral bypass graft measuring 4.9 x 1.9 x 3.9 cm. Collection is thick walled with adjacent inflammatory changes extending into the subcutaneous tissues of the upper scrotum. While there is no internal air, findings are suspicious for abscess. 2. Patent left groin anastomosis. 3. Prominent bilateral inguinal and external iliac lymph nodes are typically  reactive.   Assessment/Plan:  70 y.o. male who is s/pbilateral common femoral endarterectomies with bovine pericardial patch angioplasty and right to left femoral to femoral bypass with Dacron by Dr. Donzetta Matters on 11/04/20 readmitted with possible graft infection Hospital Day 2  -pt still with erythema and tenderness over right groin and suprapubic area.  CT scan reveals fluid collection around right aspect of fem fem bypass measuring 4.9 x 1.9 x 3.9 and extends into the subcu tissue of the upper scrotum and suspicious for abscess.  Dr. Donzetta Matters to review scan.  I have asked the pt not to eat or drink anything until Dr. Donzetta Matters has reviewed the scan made the pt npo as he may have to have his graft removed today.    Leontine Locket, PA-C [facility] [phone] 01/06/2021 7:13 AM  I have independently interviewed and examined patient and agree with PA assessment and plan above. Plan to debride right groin today in OR. I have discussed with him the grim nature of this infection and the need for further procedures to attempt salvage of this graft and his bilateral patch angioplasties.   Brantleigh Mifflin C. Donzetta Matters, MD [facility] [place] Office: [phone] Pager: [phone]

## 2021-01-07 ENCOUNTER — Encounter (HOSPITAL_COMMUNITY): Payer: Self-pay | Admitting: Surgery

## 2021-01-07 LAB — CBC
HCT: 33.4 % — ABNORMAL LOW (ref 39.0–52.0)
Hemoglobin: 10.8 g/dL — ABNORMAL LOW (ref 13.0–17.0)
MCH: 32.7 pg (ref 26.0–34.0)
MCHC: 32.3 g/dL (ref 30.0–36.0)
MCV: 101.2 fL — ABNORMAL HIGH (ref 80.0–100.0)
Platelets: 394 10*3/uL (ref 150–400)
RBC: 3.3 MIL/uL — ABNORMAL LOW (ref 4.22–5.81)
RDW: 15.8 % — ABNORMAL HIGH (ref 11.5–15.5)
WBC: 8.1 10*3/uL (ref 4.0–10.5)
nRBC: 0 % (ref 0.0–0.2)

## 2021-01-07 LAB — BASIC METABOLIC PANEL
Anion gap: 9 (ref 5–15)
BUN: 13 mg/dL (ref 8–23)
CO2: 26 mmol/L (ref 22–32)
Calcium: 9.1 mg/dL (ref 8.9–10.3)
Chloride: 93 mmol/L — ABNORMAL LOW (ref 98–111)
Creatinine, Ser: 0.89 mg/dL (ref 0.61–1.24)
GFR, Estimated: >60 mL/min (ref >60)
Glucose, Bld: 113 mg/dL — ABNORMAL HIGH (ref 70–99)
Potassium: 5.4 mmol/L — ABNORMAL HIGH (ref 3.5–5.1)
Sodium: 128 mmol/L — ABNORMAL LOW (ref 135–145)

## 2021-01-07 LAB — ACID FAST SMEAR (AFB, MYCOBACTERIA): Acid Fast Smear: NEGATIVE

## 2021-01-07 NOTE — Progress Notes (Addendum)
  Progress Note    01/07/2021 7:42 AM 1 Day Post-Op  Subjective:  R groin feels somewhat better   Vitals:   01/06/21 2303 01/07/21 0720  BP: 124/79 126/87  Pulse: 73 86  Resp: 20 (!) 23  Temp: (!) 97.5 F (36.4 C) 97.6 F (36.4 C)  SpO2: 96% 94%   Physical Exam: Lungs:  Non labored Incisions:  R groin with fem fem exposed at base of wound; raw surface oozing Extremities:  Brisk DP signal by doppler bilaterally Neurologic: A&O  CBC    Component Value Date/Time   WBC 8.1 01/07/2021 0131   RBC 3.30 (L) 01/07/2021 0131   HGB 10.8 (L) 01/07/2021 0131   HCT 33.4 (L) 01/07/2021 0131   PLT 394 01/07/2021 0131   MCV 101.2 (H) 01/07/2021 0131   MCH 32.7 01/07/2021 0131   MCHC 32.3 01/07/2021 0131   RDW 15.8 (H) 01/07/2021 0131    BMET    Component Value Date/Time   NA 128 (L) 01/07/2021 0131   NA 134 10/13/2020 0741   K 5.4 (H) 01/07/2021 0131   CL 93 (L) 01/07/2021 0131   CO2 26 01/07/2021 0131   GLUCOSE 113 (H) 01/07/2021 0131   BUN 13 01/07/2021 0131   BUN 11 10/13/2020 0741   CREATININE 0.89 01/07/2021 0131   CALCIUM 9.1 01/07/2021 0131   GFRNONAA >60 01/07/2021 0131    INR    Component Value Date/Time   INR 1.0 01/04/2021 1638     Intake/Output Summary (Last 24 hours) at 01/07/2021 1610 Last data filed at 01/06/2021 1526 Gross per 24 hour  Intake 900 ml  Output 700 ml  Net 200 ml     Assessment/Plan:  70 y.o. male is s/p I&D R groin 1 Day Post-Op   BLE well perfused Wet to dry dressing change R groin; graft exposed at base of wound Continue broad spectrum abx; intra-op cultures pending OOB today   Dagoberto Ligas, PA-C [facility] [phone] 01/07/2021 7:42 AM  I have independently interviewed and examined patient and agree with PA assessment and plan above.  Left groin without any erythema.  Leukocytosis improved.  Continue broad-spectrum antibiotics we will ultimately require infectious disease evaluation.  I  discussed with the patient the need for further operations likely next week to get muscle coverage over the wound.  He demonstrates good understanding of this.  Kashvi Prevette C. Donzetta Matters, MD [facility] [place] Office: [phone] Pager: [phone]

## 2021-01-07 NOTE — Anesthesia Postprocedure Evaluation (Signed)
Anesthesia Post Note  Patient: Sean [name]  Procedure(s) Performed: IRRIGATION AND DEBRIDEMENT GROIN (Right Groin)     Patient location during evaluation: PACU Anesthesia Type: General Level of consciousness: awake and alert Pain management: pain level controlled Vital Signs Assessment: post-procedure vital signs reviewed and stable Respiratory status: spontaneous breathing, nonlabored ventilation, respiratory function stable and patient connected to nasal cannula oxygen Cardiovascular status: blood pressure returned to baseline and stable Postop Assessment: no apparent nausea or vomiting Anesthetic complications: no   No complications documented.  Last Vitals:  Vitals:   01/07/21 0720 01/07/21 0832  BP: 126/87   Pulse: 86 95  Resp: (!) 23 18  Temp: 36.4 C   SpO2: 94%     Last Pain:  Vitals:   01/07/21 0720  TempSrc: Oral  PainSc:                  March Rummage Finnegan Gatta

## 2021-01-07 NOTE — Progress Notes (Signed)
Changed dressing to right groin. Did not remove packing. Dry gauze and ABD pad were saturated with frank blood. Replaced 4 x 4 gauze and ABD pad. Called PA Corrie and made aware.  Pt has significant hematoma at groin site to mid pubis area.  Swelling to penis and scrotal sac. PA will make Dr. Donzetta Matters aware. Pt resting with call bell within reach.  Will continue to monitor.  Payton Emerald, RN

## 2021-01-08 LAB — BASIC METABOLIC PANEL
Anion gap: 7 (ref 5–15)
BUN: 16 mg/dL (ref 8–23)
CO2: 26 mmol/L (ref 22–32)
Calcium: 9.2 mg/dL (ref 8.9–10.3)
Chloride: 98 mmol/L (ref 98–111)
Creatinine, Ser: 0.92 mg/dL (ref 0.61–1.24)
GFR, Estimated: >60 mL/min (ref >60)
Glucose, Bld: 118 mg/dL — ABNORMAL HIGH (ref 70–99)
Potassium: 4.5 mmol/L (ref 3.5–5.1)
Sodium: 131 mmol/L — ABNORMAL LOW (ref 135–145)

## 2021-01-08 LAB — CBC
HCT: 31.3 % — ABNORMAL LOW (ref 39.0–52.0)
Hemoglobin: 10.3 g/dL — ABNORMAL LOW (ref 13.0–17.0)
MCH: 33.1 pg (ref 26.0–34.0)
MCHC: 32.9 g/dL (ref 30.0–36.0)
MCV: 100.6 fL — ABNORMAL HIGH (ref 80.0–100.0)
Platelets: 372 10*3/uL (ref 150–400)
RBC: 3.11 MIL/uL — ABNORMAL LOW (ref 4.22–5.81)
RDW: 15.9 % — ABNORMAL HIGH (ref 11.5–15.5)
WBC: 12.2 10*3/uL — ABNORMAL HIGH (ref 4.0–10.5)
nRBC: 0 % (ref 0.0–0.2)

## 2021-01-08 NOTE — Progress Notes (Addendum)
[facility] [place]  Subjective  - No new complaints   Objective 126/75 69 98.7 F (37.1 C) (Oral) 20 93%  Intake/Output Summary (Last 24 hours) at 01/08/2021 1021 Last data filed at 01/08/2021 0447 Gross per 24 hour  Intake 960 ml  Output 950 ml  Net 10 ml   Right groin dressing in place sitting up in the chairs Doppler DP B LE Lungs non labored breathing    Assessment/Planning: POD # 2  70 y.o. male is s/p I&D R groin R groin; graft exposed at base of wound Continue broad spectrum abx; intra-op cultures pending We will place wound vac today   patient the need for further operations likely next week to get muscle coverage over the wound.   Roxy Horseman 01/08/2021 7:28 AM --  Laboratory Lab Results: Recent Labs    01/07/21 0131 01/08/21 0143  WBC 8.1 12.2*  HGB 10.8* 10.3*  HCT 33.4* 31.3*  PLT 394 372   BMET Recent Labs    01/07/21 0131 01/08/21 0143  NA 128* 131*  K 5.4* 4.5  CL 93* 98  CO2 26 26  GLUCOSE 113* 118*  BUN 13 16  CREATININE 0.89 0.92  CALCIUM 9.1 9.2    COAG Lab Results  Component Value Date   INR 1.0 01/04/2021   INR 1.0 10/30/2020   No results found for: PTT  I have independently interviewed and examined patient and agree with PA assessment and plan above. Bleeding from wound appears to be venous and from the area of the scrotum that is involved. Daily wet to dry can change to wound vac for the weekend for comfort of patient as graft appears mostly incorporated. Continue broad spectrum abx and will likely need ID involvement when cultures result. Probably tissue coverage early next week.  Mandela Bello C. Donzetta Matters, MD [facility] [place] Office: [phone] Pager: [phone]

## 2021-01-08 NOTE — Progress Notes (Signed)
Wound VAC supplies in room as ordered.  Dressing changed at 1830.  Will cont plan of care.

## 2021-01-09 LAB — CBC
HCT: 32.5 % — ABNORMAL LOW (ref 39.0–52.0)
Hemoglobin: 10.7 g/dL — ABNORMAL LOW (ref 13.0–17.0)
MCH: 33.2 pg (ref 26.0–34.0)
MCHC: 32.9 g/dL (ref 30.0–36.0)
MCV: 100.9 fL — ABNORMAL HIGH (ref 80.0–100.0)
Platelets: 373 10*3/uL (ref 150–400)
RBC: 3.22 MIL/uL — ABNORMAL LOW (ref 4.22–5.81)
RDW: 15.9 % — ABNORMAL HIGH (ref 11.5–15.5)
WBC: 10.3 10*3/uL (ref 4.0–10.5)
nRBC: 0 % (ref 0.0–0.2)

## 2021-01-09 NOTE — Progress Notes (Addendum)
VASCULAR SURGERY ASSESSMENT & PLAN:   3 Days Post-Op  Right groin infection: s/p I&D of right groin and placement of antibiotic impregnated beads. Strep agalactiae isolated. VSS. Afebrile. Normal WBC. VAC dressing placed today. Continue Zosyn and vancomycin. Plan for ID evaluation and  muscle flap closure is scheduled for Tuesday with Dr. Donzetta Matters.  PAD: s/p bilateral femoral endarterectomy and femoral-femoral bypass graft. BLE well perfused without tissue loss. Palpable pulse in bypass. Continue asa, statin.  History of hypertension: Continue home meds   SUBJECTIVE:   No complaints. Denies CP, SOB. Tolerating diet.  PHYSICAL EXAM:   Vitals:   01/08/21 1547 01/08/21 2043 01/08/21 2343 01/09/21 0435  BP: 138/73 121/78 (!) 130/93 114/74  Pulse: 88 91 76 93  Resp: 20 15 18 15   Temp: (!) 97.3 F (36.3 C) 97.6 F (36.4 C) 97.6 F (36.4 C) 98.1 F (36.7 C)  TempSrc: Oral Oral Oral Oral  SpO2: 94% 96% 97% 94%  Weight:      Height:       General appearance: Awake, alert in no apparent distress Cardiac: Heart rate and rhythm are regular Respirations: Nonlabored Incisions: Right groin dressing removed: thin blood in wound bed. No purulence. Wound VAC placed with good seal. Peri-wound induration and mild scrotal swelling noted. Extremities: Both feet are warm with intact sensation and motor function.  No skin breakdown. Pulse/Doppler exam: Palpable pulse in fem-fem bypass. 2+ palpable DP left pulse and brisk right biphasic dorsalis pedis Doppler signal.  LABS:   Lab Results  Component Value Date   WBC 10.3 01/09/2021   HGB 10.7 (L) 01/09/2021   HCT 32.5 (L) 01/09/2021   MCV 100.9 (H) 01/09/2021   PLT 373 01/09/2021   Lab Results  Component Value Date   CREATININE 0.92 01/08/2021   Lab Results  Component Value Date   INR 1.0 01/04/2021   CBG (last 3)  No results for input(s): GLUCAP in the last 72 hours.   Component 3 d ago  Specimen Description WOUND RIGHT GROIN    Special Requests PT ON ANCEF   Gram Stain MODERATE WBC PRESENT,BOTH PMN AND MONONUCLEAR  RARE GRAM POSITIVE COCCI  Performed at [facility], [place] [address], [place], [place] [address]   Culture FEW STREPTOCOCCUS AGALACTIAE  TESTING AGAINST S. AGALACTIAE NOT ROUTINELY PERFORMED DUE TO PREDICTABILITY OF AMP/PEN/VAN SUSCEPTIBILITY.  NO ANAEROBES ISOLATED; CULTURE IN PROGRESS FOR 5 DAYS   Report Status PENDING      PROBLEM LIST:    Active Problems:   PAD (peripheral artery disease) (HCC)   CURRENT MEDS:   . aspirin EC  81 mg Oral Daily  . benazepril  10 mg Oral QHS  . cilostazol  100 mg Oral BID  . docusate sodium  100 mg Oral BID  . heparin  5,000 Units Subcutaneous Q8H  . metoprolol  200 mg Oral Daily  . mometasone-formoterol  2 puff Inhalation BID  . niacin  100 mg Oral QHS  . pantoprazole  40 mg Oral BID  . rosuvastatin  10 mg Oral Daily  . sodium chloride flush  3 mL Intravenous Q12H  . sodium chloride flush  3 mL Intravenous Q12H  . spironolactone  25 mg Oral Daily  . thiamine  100 mg Oral Daily  . umeclidinium bromide  1 puff Inhalation Daily    Barbie Banner, [place] Office: [phone] 01/09/2021   I have seen and evaluated the patient. I agree with the PA note as documented above.  Palpable DP pulses bilaterally.  VAC was placed in the right groin at bedside this morning.  Overall the wound looks healthy with antibiotic beads.  I tentatively posted him for Tuesday for washout in the OR and muscle flap with Dr. Donzetta Matters.  Marty Heck, MD [facility] [place] Office: [phone]

## 2021-01-09 NOTE — Progress Notes (Signed)
Pharmacy Antibiotic Note  Sean [name] is a 70 y.o. male admitted on 01/04/2021 with right groin infection s/p I&D of right groin and placement of antibiotic impregnated beads. Pharmacy has been consulted for vancomycin dosing.   Wound cx growing strep agalactiae. Planning for OR washout and muscle flap closure 5/31. Pt is afebrile, WBC WNL, Scr 0.92 (stable).  Plan: Vancomycin 750mg  IV q12h (eAUC 523.5, goal AUC 400-550) Monitor renal function, cultures and clinical progress Follow up ID recs, consider vanc levels as needed  Height: 5\' 6"  (167.6 cm) Weight: 92.5 kg (204 lb) IBW/kg (Calculated) : 63.8  Temp (24hrs), Avg:97.8 F (36.6 C), Min:97.3 F (36.3 C), Max:98.4 F (36.9 C)  Recent Labs  Lab 01/04/21 1638 01/07/21 0131 01/08/21 0143 01/09/21 0050  WBC 14.7* 8.1 12.2* 10.3  CREATININE 0.82 0.89 0.92  --     Estimated Creatinine Clearance: 81.8 mL/min (by C-G formula based on SCr of 0.92 mg/dL).    Allergies  Allergen Reactions  . Amlodipine Swelling and Other (See Comments)    Edema of lower extremities    Romilda Garret, PharmD PGY1 Acute Care Pharmacy Resident 01/09/2021 11:06 AM  Please check [email or web address] for unit specific pharmacy phone numbers.

## 2021-01-09 NOTE — Progress Notes (Signed)
Mobility Specialist - Progress Note   01/09/21 1653  Mobility  Activity Ambulated in hall  Level of Assistance Standby assist, set-up cues, supervision of patient - no hands on  Assistive Device None  Distance Ambulated (ft) 250 ft  Mobility Ambulated with assistance in hallway  Mobility Response Tolerated well  Mobility performed by Mobility specialist  $Mobility charge 1 Mobility   Ambulated pt at request of RN. Pt asx throughout ambulation. Pt to chair after walk, call bell at side. VSS throughout.  Pricilla Handler Mobility Specialist Mobility Specialist Phone: [phone]

## 2021-01-10 MED ORDER — SODIUM CHLORIDE 0.9 % IV SOLN
2.0000 g | Freq: Four times a day (QID) | INTRAVENOUS | Status: DC
  Administered 2021-01-10 – 2021-01-14 (×16): 2 g via INTRAVENOUS
  Filled 2021-01-10 (×2): qty 2
  Filled 2021-01-10: qty 2000
  Filled 2021-01-10 (×2): qty 2
  Filled 2021-01-10: qty 2000
  Filled 2021-01-10 (×8): qty 2
  Filled 2021-01-10: qty 2000
  Filled 2021-01-10 (×2): qty 2
  Filled 2021-01-10: qty 2000

## 2021-01-10 NOTE — Progress Notes (Addendum)
   VASCULAR SURGERY ASSESSMENT & PLAN:   4 Days Post-Op  Right groin infection: s/p I&D of right groin and placement of antibiotic impregnated beads. Strep agalactiae isolated. VSS. Remains afebrile. Normal WBC yesterday. VAC dressing placed yesterday. Continue Zosyn and vancomycin. Plan for ID evaluation. Muscle flap closure is scheduled for Tuesday with Dr. Donzetta Matters. Leave VAC in place until then.  PAD: s/p bilateral femoral endarterectomy and femoral-femoral bypass graft. BLE well perfused without tissue loss. Palpable pulse in bypass. Continue asa, statin.  History of hypertension: Continue home meds  SUBJECTIVE:   General appearance: Awake, alert in no apparent distress Cardiac: Heart rate and rhythm are regular Respirations: Nonlabored Incisions:  Wound VAC placed with good seal. Peri-wound induration, tenderness to palpation and mild scrotal swelling noted. Serosanguineous VAC output: 50cc Extremities: Both feet are warm with intact sensation and motor function.  No skin breakdown. Pulse/Doppler exam: Palpable pulse in fem-fem bypass. 2+ palpable DP left pulse and brisk right biphasic dorsalis pedis, PT and peroneal Doppler signals.  PHYSICAL EXAM:   Vitals:   01/09/21 2029 01/09/21 2327 01/10/21 0457 01/10/21 0751  BP:  131/75 119/79 131/80  Pulse:  77 92 100  Resp:  18 20 17   Temp:  98 F (36.7 C) 97.6 F (36.4 C) 97.6 F (36.4 C)  TempSrc:  Oral Oral Oral  SpO2: 96% 100% 98% 95%  Weight:      Height:       No complaints.  LABS:   Lab Results  Component Value Date   WBC 10.3 01/09/2021   HGB 10.7 (L) 01/09/2021   HCT 32.5 (L) 01/09/2021   MCV 100.9 (H) 01/09/2021   PLT 373 01/09/2021   Lab Results  Component Value Date   CREATININE 0.92 01/08/2021   Lab Results  Component Value Date   INR 1.0 01/04/2021   CBG (last 3)  No results for input(s): GLUCAP in the last 72 hours.  PROBLEM LIST:    Active Problems:   PAD (peripheral artery disease)  (HCC)   CURRENT MEDS:   . aspirin EC  81 mg Oral Daily  . benazepril  10 mg Oral QHS  . cilostazol  100 mg Oral BID  . docusate sodium  100 mg Oral BID  . heparin  5,000 Units Subcutaneous Q8H  . metoprolol  200 mg Oral Daily  . mometasone-formoterol  2 puff Inhalation BID  . niacin  100 mg Oral QHS  . pantoprazole  40 mg Oral BID  . rosuvastatin  10 mg Oral Daily  . sodium chloride flush  3 mL Intravenous Q12H  . sodium chloride flush  3 mL Intravenous Q12H  . spironolactone  25 mg Oral Daily  . thiamine  100 mg Oral Daily  . umeclidinium bromide  1 puff Inhalation Daily    Barbie Banner, [place] Office: [phone] 01/10/2021   I have seen and evaluated the patient. I agree with the PA note as documented above.  VAC placed in the right groin wound yesterday and looked pretty good.  Good seal in the VAC overnight.  No complaints this morning.  Posted for Tuesday for washout and possible muscle flap with Dr. Donzetta Matters.  Will transition to ampicillin after discussion with pharmacy given strep agalactiae in the wound and stop Vanc Zosyn.  Marty Heck, MD [facility] [place] Office: [phone]

## 2021-01-11 LAB — AEROBIC/ANAEROBIC CULTURE W GRAM STAIN (SURGICAL/DEEP WOUND)

## 2021-01-11 NOTE — Progress Notes (Addendum)
   VASCULAR SURGERY ASSESSMENT & PLAN:   5 Days Post-Op Right groin infection: s/pI&D of right groinand placement of antibiotic impregnated beads. Strep agalactiae isolated. VSS. Tm 99.  VAC dressing in place. Antibiotics de-escalated yesterday to ampicillin.  Muscle flap closure is scheduled tomorrow with Dr. Donzetta Matters. NPO after MN order placed. Leave VAC in place until then.  PAD: s/pbilateral femoral endarterectomy and femoral-femoral bypass graft. BLE well perfused without tissue loss.Palpable pulse in bypass.Continue asa, statin.  History of hypertension: systolic 846K, diastolic 59D. Continue home meds  SUBJECTIVE:   OOB to chair eating breakfast. He states improvement in right groin. Ambulating without difficulty. Tolerating diet. +BMs.  PHYSICAL EXAM:   Vitals:   01/11/21 0003 01/11/21 0505 01/11/21 0743 01/11/21 0745  BP: 131/75 135/76 135/80   Pulse: 65 93 100   Resp: 19 20 20    Temp: 99 F (37.2 C) 98.2 F (36.8 C) (!) 97.4 F (36.3 C)   TempSrc: Oral Oral Oral   SpO2: 97% 97% 91% 94%  Weight:      Height:       General appearance: Awake, alert in no apparent distress Cardiac: Heart rate and rhythm are regular Respirations: Nonlabored Incisions:  Wound VAC placed with good seal. Peri-wound induration, tenderness to palpation and mild scrotal swelling less prominent today. Serosanguineous VAC output: total 135 cc. Extremities: Both feet are warm with intact sensation and motor function. No skin breakdown. Pulse/Doppler exam:Palpable pulse in fem-fem bypass.   LABS:   Lab Results  Component Value Date   WBC 10.3 01/09/2021   HGB 10.7 (L) 01/09/2021   HCT 32.5 (L) 01/09/2021   MCV 100.9 (H) 01/09/2021   PLT 373 01/09/2021   Lab Results  Component Value Date   CREATININE 0.92 01/08/2021   Lab Results  Component Value Date   INR 1.0 01/04/2021   CBG (last 3)  No results for input(s): GLUCAP in the last 72 hours.  PROBLEM LIST:    Active  Problems:   PAD (peripheral artery disease) (HCC)   CURRENT MEDS:   . aspirin EC  81 mg Oral Daily  . benazepril  10 mg Oral QHS  . cilostazol  100 mg Oral BID  . docusate sodium  100 mg Oral BID  . heparin  5,000 Units Subcutaneous Q8H  . metoprolol  200 mg Oral Daily  . mometasone-formoterol  2 puff Inhalation BID  . niacin  100 mg Oral QHS  . pantoprazole  40 mg Oral BID  . rosuvastatin  10 mg Oral Daily  . sodium chloride flush  3 mL Intravenous Q12H  . sodium chloride flush  3 mL Intravenous Q12H  . spironolactone  25 mg Oral Daily  . thiamine  100 mg Oral Daily  . umeclidinium bromide  1 puff Inhalation Daily    Barbie Banner, [place] Office: [phone] 01/11/2021   I have seen and evaluated the patient. I agree with the PA note as documented above.  No acute events overnight.  We placed wound VAC in the right groin on Saturday and overall this looked pretty good.  Yesterday we de-escalated antibiotics to ampicillin after discussion with pharmacy and stopped vanc Zosyn.  Cultures are growing strep agalactiae.  On the OR schedule tomorrow with Dr. Donzetta Matters for washout, VAC change, and possible muscle flap.  Will be n.p.o. after midnight.  Marty Heck, MD [facility] [place] Office: [phone]

## 2021-01-12 ENCOUNTER — Inpatient Hospital Stay (HOSPITAL_COMMUNITY): Payer: No Typology Code available for payment source | Admitting: Anesthesiology

## 2021-01-12 ENCOUNTER — Encounter (HOSPITAL_COMMUNITY): Payer: Self-pay | Admitting: Vascular Surgery

## 2021-01-12 ENCOUNTER — Encounter (HOSPITAL_COMMUNITY)
Admission: AD | Disposition: A | Discharge status: Home / Self Care | Payer: Self-pay | Source: Ambulatory Visit | Attending: Vascular Surgery

## 2021-01-12 DIAGNOSIS — Y832 Surgical operation with anastomosis, bypass or graft as the cause of abnormal reaction of the patient, or of later complication, without mention of misadventure at the time of the procedure: Secondary | ICD-10-CM

## 2021-01-12 DIAGNOSIS — T8141XA Infection following a procedure, superficial incisional surgical site, initial encounter: Secondary | ICD-10-CM

## 2021-01-12 HISTORY — PX: INCISION AND DRAINAGE OF WOUND: SHX1803

## 2021-01-12 HISTORY — PX: APPLICATION OF WOUND VAC: SHX5189

## 2021-01-12 SURGERY — IRRIGATION AND DEBRIDEMENT WOUND
Anesthesia: General | Site: Groin | Laterality: Right

## 2021-01-12 MED ORDER — PROPOFOL 10 MG/ML IV BOLUS
INTRAVENOUS | Status: AC
  Filled 2021-01-12: qty 40

## 2021-01-12 MED ORDER — SODIUM CHLORIDE 0.9 % IV SOLN
INTRAVENOUS | Status: DC | PRN
  Administered 2021-01-12: 500 mL

## 2021-01-12 MED ORDER — SUGAMMADEX SODIUM 200 MG/2ML IV SOLN
INTRAVENOUS | Status: DC | PRN
  Administered 2021-01-12: 200 mg via INTRAVENOUS

## 2021-01-12 MED ORDER — ALBUMIN HUMAN 5 % IV SOLN: INTRAVENOUS | Status: DC | PRN

## 2021-01-12 MED ORDER — DEXAMETHASONE SODIUM PHOSPHATE 10 MG/ML IJ SOLN
INTRAMUSCULAR | Status: AC
  Filled 2021-01-12: qty 1

## 2021-01-12 MED ORDER — ONDANSETRON HCL 4 MG/2ML IJ SOLN
INTRAMUSCULAR | Status: AC
  Filled 2021-01-12: qty 2

## 2021-01-12 MED ORDER — VANCOMYCIN HCL 1000 MG IV SOLR
INTRAVENOUS | Status: AC
  Filled 2021-01-12: qty 1000

## 2021-01-12 MED ORDER — LIDOCAINE 2% (20 MG/ML) 5 ML SYRINGE
INTRAMUSCULAR | Status: AC
  Filled 2021-01-12: qty 5

## 2021-01-12 MED ORDER — DEXAMETHASONE SODIUM PHOSPHATE 10 MG/ML IJ SOLN
INTRAMUSCULAR | Status: DC | PRN
  Administered 2021-01-12: 4 mg via INTRAVENOUS

## 2021-01-12 MED ORDER — METOPROLOL SUCCINATE ER 100 MG PO TB24
200.0000 mg | ORAL_TABLET | Freq: Once | ORAL | Status: AC
  Administered 2021-01-12: 200 mg via ORAL
  Filled 2021-01-12: qty 2

## 2021-01-12 MED ORDER — SODIUM CHLORIDE 0.9 % IR SOLN
Status: DC | PRN
  Administered 2021-01-12: 3000 mL

## 2021-01-12 MED ORDER — ORAL CARE MOUTH RINSE: 15.0000 mL | Freq: Once | OROMUCOSAL | Status: AC

## 2021-01-12 MED ORDER — LIDOCAINE 2% (20 MG/ML) 5 ML SYRINGE
INTRAMUSCULAR | Status: DC | PRN
  Administered 2021-01-12: 100 mg via INTRAVENOUS

## 2021-01-12 MED ORDER — ROCURONIUM BROMIDE 10 MG/ML (PF) SYRINGE
PREFILLED_SYRINGE | INTRAVENOUS | Status: AC
  Filled 2021-01-12: qty 10

## 2021-01-12 MED ORDER — FENTANYL CITRATE (PF) 100 MCG/2ML IJ SOLN
INTRAMUSCULAR | Status: AC
  Filled 2021-01-12: qty 2

## 2021-01-12 MED ORDER — PROPOFOL 10 MG/ML IV BOLUS
INTRAVENOUS | Status: DC | PRN
  Administered 2021-01-12: 130 mg via INTRAVENOUS

## 2021-01-12 MED ORDER — FENTANYL CITRATE (PF) 250 MCG/5ML IJ SOLN
INTRAMUSCULAR | Status: DC | PRN
  Administered 2021-01-12 (×3): 50 ug via INTRAVENOUS

## 2021-01-12 MED ORDER — SODIUM CHLORIDE 0.9 % IV SOLN
INTRAVENOUS | Status: AC
  Filled 2021-01-12: qty 1.2

## 2021-01-12 MED ORDER — LACTATED RINGERS IV SOLN: INTRAVENOUS | Status: DC

## 2021-01-12 MED ORDER — PHENYLEPHRINE 40 MCG/ML (10ML) SYRINGE FOR IV PUSH (FOR BLOOD PRESSURE SUPPORT)
PREFILLED_SYRINGE | INTRAVENOUS | Status: DC | PRN
  Administered 2021-01-12 (×2): 120 ug via INTRAVENOUS
  Administered 2021-01-12: 40 ug via INTRAVENOUS
  Administered 2021-01-12: 120 ug via INTRAVENOUS

## 2021-01-12 MED ORDER — MIDAZOLAM HCL 5 MG/5ML IJ SOLN
INTRAMUSCULAR | Status: DC | PRN
  Administered 2021-01-12 (×2): 1 mg via INTRAVENOUS

## 2021-01-12 MED ORDER — GENTAMICIN SULFATE 40 MG/ML IJ SOLN
INTRAMUSCULAR | Status: AC
  Filled 2021-01-12: qty 6

## 2021-01-12 MED ORDER — FENTANYL CITRATE (PF) 100 MCG/2ML IJ SOLN
25.0000 ug | INTRAMUSCULAR | Status: DC | PRN
  Administered 2021-01-12: 25 ug via INTRAVENOUS

## 2021-01-12 MED ORDER — MIDAZOLAM HCL 2 MG/2ML IJ SOLN
INTRAMUSCULAR | Status: AC
  Filled 2021-01-12: qty 2

## 2021-01-12 MED ORDER — ROCURONIUM BROMIDE 10 MG/ML (PF) SYRINGE
PREFILLED_SYRINGE | INTRAVENOUS | Status: DC | PRN
  Administered 2021-01-12: 70 mg via INTRAVENOUS

## 2021-01-12 MED ORDER — EPHEDRINE SULFATE-NACL 50-0.9 MG/10ML-% IV SOSY
PREFILLED_SYRINGE | INTRAVENOUS | Status: DC | PRN
  Administered 2021-01-12: 10 mg via INTRAVENOUS

## 2021-01-12 MED ORDER — FENTANYL CITRATE (PF) 250 MCG/5ML IJ SOLN
INTRAMUSCULAR | Status: AC
  Filled 2021-01-12: qty 5

## 2021-01-12 MED ORDER — CHLORHEXIDINE GLUCONATE 0.12 % MT SOLN
15.0000 mL | Freq: Once | OROMUCOSAL | Status: AC
  Administered 2021-01-12: 15 mL via OROMUCOSAL
  Filled 2021-01-12: qty 15

## 2021-01-12 MED ORDER — EPHEDRINE 5 MG/ML INJ
INTRAVENOUS | Status: AC
  Filled 2021-01-12: qty 10

## 2021-01-12 MED ORDER — ONDANSETRON HCL 4 MG/2ML IJ SOLN
INTRAMUSCULAR | Status: DC | PRN
  Administered 2021-01-12: 4 mg via INTRAVENOUS

## 2021-01-12 MED ORDER — 0.9 % SODIUM CHLORIDE (POUR BTL) OPTIME
TOPICAL | Status: DC | PRN
  Administered 2021-01-12: 1000 mL

## 2021-01-12 SURGICAL SUPPLY — 45 items
BAG DECANTER FOR FLEXI CONT (MISCELLANEOUS) ×2 IMPLANT
BNDG ELASTIC 4X5.8 VLCR STR LF (GAUZE/BANDAGES/DRESSINGS) IMPLANT
BNDG ELASTIC 6X5.8 VLCR STR LF (GAUZE/BANDAGES/DRESSINGS) IMPLANT
BNDG GAUZE ELAST 4 BULKY (GAUZE/BANDAGES/DRESSINGS) IMPLANT
CANISTER SUCT 3000ML PPV (MISCELLANEOUS) ×2 IMPLANT
CANISTER WOUND CARE 500ML ATS (WOUND CARE) ×2 IMPLANT
CLIP LIGATING EXTRA MED SLVR (CLIP) IMPLANT
CLIP LIGATING EXTRA SM BLUE (MISCELLANEOUS) IMPLANT
CLIP VESOCCLUDE MED 6/CT (CLIP) IMPLANT
CLIP VESOCCLUDE SM WIDE 6/CT (CLIP) IMPLANT
COVER SURGICAL LIGHT HANDLE (MISCELLANEOUS) ×2 IMPLANT
COVER WAND RF STERILE (DRAPES) ×2 IMPLANT
DRAPE HALF SHEET 40X57 (DRAPES) ×2 IMPLANT
DRSG VAC ATS SM SENSATRAC (GAUZE/BANDAGES/DRESSINGS) ×2 IMPLANT
ELECT REM PT RETURN 9FT ADLT (ELECTROSURGICAL) ×2
ELECTRODE REM PT RTRN 9FT ADLT (ELECTROSURGICAL) ×1 IMPLANT
GAUZE SPONGE 4X4 12PLY STRL (GAUZE/BANDAGES/DRESSINGS) ×2 IMPLANT
GAUZE XEROFORM 5X9 LF (GAUZE/BANDAGES/DRESSINGS) IMPLANT
GLOVE BIO SURGEON STRL SZ7.5 (GLOVE) ×2 IMPLANT
GLOVE SURG UNDER POLY LF SZ6.5 (GLOVE) ×6 IMPLANT
GLOVE SURG UNDER POLY LF SZ7 (GLOVE) ×2 IMPLANT
GOWN STRL REUS W/ TWL LRG LVL3 (GOWN DISPOSABLE) ×3 IMPLANT
GOWN STRL REUS W/ TWL XL LVL3 (GOWN DISPOSABLE) ×1 IMPLANT
GOWN STRL REUS W/TWL LRG LVL3 (GOWN DISPOSABLE) ×3
GOWN STRL REUS W/TWL XL LVL3 (GOWN DISPOSABLE) ×1
HANDPIECE INTERPULSE COAX TIP (DISPOSABLE) ×1
IV NS IRRIG 3000ML ARTHROMATIC (IV SOLUTION) ×2 IMPLANT
KIT BASIN OR (CUSTOM PROCEDURE TRAY) ×2 IMPLANT
KIT TURNOVER KIT B (KITS) ×2 IMPLANT
NS IRRIG 1000ML POUR BTL (IV SOLUTION) ×2 IMPLANT
PACK CV ACCESS (CUSTOM PROCEDURE TRAY) IMPLANT
PACK GENERAL/GYN (CUSTOM PROCEDURE TRAY) ×2 IMPLANT
PACK UNIVERSAL I (CUSTOM PROCEDURE TRAY) ×2 IMPLANT
PAD ARMBOARD 7.5X6 YLW CONV (MISCELLANEOUS) ×4 IMPLANT
PULSAVAC PLUS IRRIG FAN TIP (DISPOSABLE)
SET HNDPC FAN SPRY TIP SCT (DISPOSABLE) ×1 IMPLANT
SUT ETHILON 3 0 PS 1 (SUTURE) IMPLANT
SUT VIC AB 2-0 CTB1 (SUTURE) ×6 IMPLANT
SUT VIC AB 2-0 CTX 36 (SUTURE) IMPLANT
SUT VIC AB 3-0 SH 27 (SUTURE)
SUT VIC AB 3-0 SH 27X BRD (SUTURE) IMPLANT
SUT VICRYL 4-0 PS2 18IN ABS (SUTURE) IMPLANT
TIP FAN IRRIG PULSAVAC PLUS (DISPOSABLE) IMPLANT
TOWEL GREEN STERILE (TOWEL DISPOSABLE) ×2 IMPLANT
WATER STERILE IRR 1000ML POUR (IV SOLUTION) ×2 IMPLANT

## 2021-01-12 NOTE — Progress Notes (Signed)
  Progress Note    01/12/2021 8:22 AM Day of Surgery  Subjective:  Vac malfunction overnight  Vitals:   01/12/21 0741 01/12/21 0757  BP: 127/83 (!) 146/69  Pulse: 79 66  Resp: 17 17  Temp: 97.6 F (36.4 C) 97.8 F (36.6 C)  SpO2: 95% 95%    Physical Exam: aaox3 Non labored respirations Erythema of right groin has resolved  CBC    Component Value Date/Time   WBC 10.3 01/09/2021 0050   RBC 3.22 (L) 01/09/2021 0050   HGB 10.7 (L) 01/09/2021 0050   HCT 32.5 (L) 01/09/2021 0050   PLT 373 01/09/2021 0050   MCV 100.9 (H) 01/09/2021 0050   MCH 33.2 01/09/2021 0050   MCHC 32.9 01/09/2021 0050   RDW 15.9 (H) 01/09/2021 0050    BMET    Component Value Date/Time   NA 131 (L) 01/08/2021 0143   NA 134 10/13/2020 0741   K 4.5 01/08/2021 0143   CL 98 01/08/2021 0143   CO2 26 01/08/2021 0143   GLUCOSE 118 (H) 01/08/2021 0143   BUN 16 01/08/2021 0143   BUN 11 10/13/2020 0741   CREATININE 0.92 01/08/2021 0143   CALCIUM 9.2 01/08/2021 0143   GFRNONAA >60 01/08/2021 0143    INR    Component Value Date/Time   INR 1.0 01/04/2021 1638     Intake/Output Summary (Last 24 hours) at 01/12/2021 2094 Last data filed at 01/12/2021 0410 Gross per 24 hour  Intake 840 ml  Output 415 ml  Net 425 ml     Assessment/plan:  70 y.o. male is here with delayed graft infection R groin. To OR today for washout possible muscle flap coverage of graft.    Aasiyah Auerbach C. Donzetta Matters, MD [facility] [place] Office: [phone] Pager: [phone]  01/12/2021 8:22 AM

## 2021-01-12 NOTE — Op Note (Signed)
    Patient name: Sean [name] MRN: [id number] DOB: 11-Oct-1950 Sex: male  01/12/2021 Pre-operative Diagnosis: right groin wound infection Post-operative diagnosis:  Same Surgeon:  Erlene Quan C. Donzetta Matters, MD Assistant: Delena Serve Procedure Performed: 1.  Pulse evacuation right groin wound with 3 L saline 2.  Right groin sartorius muscle flap 3.  Application negative pressure dressing right groin wound  Indications: 70 year old male with a history of bilateral common femoral endarterectomies with bovine pericardial patch angioplasty and a right to left femoral to femoral bypass graft with Dacron.  He initially had done very well and then developed purulent drainage from the right groin this was washed out and antibiotic beads were placed last week.  He now has negative pressure dressing.  He is indicated for further washout possible sartorius muscle flap placement.  Findings: Only the anterior aspect of part of the femorofemoral bypass is exposed the rest of the bypass is incorporated as well as the common femoral endarterectomy site with patch angioplasty.  No suture was identified.  We thoroughly irrigated the wound including down into the scrotum removed all of the antibiotic beads.  The sartorius muscle was mobilized from the anterior superior iliac spine.  This was healthy.  There was 1 large vessel identified and protected.  The flap was sutured medially with interrupted 2-0 Vicryl suture.  We did close down some of the space from the harvest site and a wound VAC was placed at completion.   Procedure:  The patient was identified in the holding area and taken to the operating was put supine on upper table and general anesthesia induced.  He was sterilely prepped draped in the right groin in usual fashion, antibiotics were ministered timeout was called.  We began with pulse evacuation of the right groin this totaled 3 L.  We removed all of the antibiotic beads including down to the scrotum.  I then  extended the incision toward the anterior superior leg spine approximately 2 inches from the previous incision.  We dissected down to the sartorius muscle.  We dissected up to the anterior superior iliac spine and detach the sartorius muscle.  We then worked laterally using cautery to remove the muscle.  Medially we worked bluntly.  I did identify 1 large vessel.  The muscle appeared healthy.  We tacked the tendon of the muscle medially with interrupted 2-0 Vicryl suture.  I then used interrupted 2-0 Vicryl suture to close down some of the lateral incision site.  A wound VAC was then placed.  He was awakened from anesthesia having tolerated procedure without any complication.  All counts were correct at completion  EBL: 20cc   Irais Mottram C. Donzetta Matters, MD [facility] [place] Office: [phone] Pager: [phone]

## 2021-01-12 NOTE — Transfer of Care (Signed)
Immediate Anesthesia Transfer of Care Note  Patient: Sean [name]  Procedure(s) Performed: Vella Redhead OUT AND MUSCLE FLAP (Right Groin) APPLICATION OF WOUND VAC (Right Groin)  Patient Location: PACU  Anesthesia Type:General  Level of Consciousness: awake, alert , oriented and patient cooperative  Airway & Oxygen Therapy: Patient Spontanous Breathing and Patient connected to face mask oxygen  Post-op Assessment: Report given to RN and Post -op Vital signs reviewed and stable  Post vital signs: Reviewed and stable  Last Vitals:  Vitals Value Taken Time  BP 142/77 01/12/21 1019  Temp    Pulse 76 01/12/21 1020  Resp 18 01/12/21 1020  SpO2 100 % 01/12/21 1020  Vitals shown include unvalidated device data.  Last Pain:  Vitals:   01/12/21 0810  TempSrc:   PainSc: 0-No pain      Patients Stated Pain Goal: 1 (17/98/10 2548)  Complications: No complications documented.

## 2021-01-12 NOTE — Progress Notes (Signed)
Pt received from PACU. VSS. R groin w/ wound vac at 125 continuous w/ good seal. Call light in reach.  Clyde Canterbury, RN

## 2021-01-12 NOTE — Progress Notes (Signed)
Pt's wound vac to right groin started beeping occlude all clamps and lines checked no signs of bleeding not some drainage still being pulled through line. DR Carlis Abbott notified and since pt is going to OR this am to turn machine off.

## 2021-01-12 NOTE — Anesthesia Preprocedure Evaluation (Addendum)
Anesthesia Evaluation  Patient identified by MRN, date of birth, ID band Patient awake    Reviewed: Allergy & Precautions, NPO status , Patient's Chart, lab work & pertinent test results  History of Anesthesia Complications Negative for: history of anesthetic complications  Airway Mallampati: II  TM Distance: >3 FB Neck ROM: Full    Dental  (+) Poor Dentition, Dental Advisory Given   Pulmonary sleep apnea , COPD,  COPD inhaler, former smoker,    Pulmonary exam normal        Cardiovascular hypertension, Pt. on medications and Pt. on home beta blockers + Peripheral Vascular Disease  Normal cardiovascular exam  Myocardial perfusion is abnormal.   The study is normal.   This is a low risk study.  Overall left ventricular systolic function was normal.    LV cavity size is normal.  Nuclear stress EF:  59%.  The left ventricular ejection fraction is normal (55-65%).   There is no prior study for comparison.    Neuro/Psych Anxiety negative neurological ROS     GI/Hepatic GERD  ,  Endo/Other  negative endocrine ROS  Renal/GU negative Renal ROS   Groin infection    Musculoskeletal  (+) Arthritis ,   Abdominal   Peds  Hematology negative hematology ROS (+) anemia ,   Anesthesia Other Findings   Reproductive/Obstetrics                           Anesthesia Physical  Anesthesia Plan  ASA: III  Anesthesia Plan: General   Post-op Pain Management:    Induction: Intravenous  PONV Risk Score and Plan: 2 and Ondansetron, Dexamethasone, Midazolam and Treatment may vary due to age or medical condition  Airway Management Planned: LMA and Oral ETT  Additional Equipment: None  Intra-op Plan:   Post-operative Plan: Extubation in OR  Informed Consent: I have reviewed the patients History and Physical, chart, labs and discussed the procedure including the risks, benefits and alternatives for the  proposed anesthesia with the patient or authorized representative who has indicated his/her understanding and acceptance.     Dental advisory given  Plan Discussed with: CRNA, Anesthesiologist and Surgeon  Anesthesia Plan Comments: ( )      Anesthesia Quick Evaluation

## 2021-01-12 NOTE — Anesthesia Procedure Notes (Signed)
Procedure Name: Intubation Date/Time: 01/12/2021 9:19 AM Performed by: Colin Benton, CRNA Pre-anesthesia Checklist: Patient identified, Emergency Drugs available, Suction available and Patient being monitored Patient Re-evaluated:Patient Re-evaluated prior to induction Oxygen Delivery Method: Circle system utilized Preoxygenation: Pre-oxygenation with 100% oxygen Induction Type: IV induction Ventilation: Mask ventilation without difficulty and Oral airway inserted - appropriate to patient size Laryngoscope Size: Sabra Heck and 2 Grade View: Grade II Tube type: Oral Tube size: 7.5 mm Number of attempts: 1 Airway Equipment and Method: Stylet and Oral airway Placement Confirmation: ETT inserted through vocal cords under direct vision,  positive ETCO2 and breath sounds checked- equal and bilateral Secured at: 22 cm Tube secured with: Tape Dental Injury: Teeth and Oropharynx as per pre-operative assessment

## 2021-01-12 NOTE — Anesthesia Postprocedure Evaluation (Signed)
Anesthesia Post Note  Patient: Sean [name]  Procedure(s) Performed: Vella Redhead OUT AND MUSCLE FLAP (Right Groin) APPLICATION OF WOUND VAC (Right Groin)     Patient location during evaluation: PACU Anesthesia Type: General Level of consciousness: sedated Pain management: pain level controlled Vital Signs Assessment: post-procedure vital signs reviewed and stable Respiratory status: spontaneous breathing and respiratory function stable Cardiovascular status: stable Postop Assessment: no apparent nausea or vomiting Anesthetic complications: no   No complications documented.  Last Vitals:  Vitals:   01/12/21 1035 01/12/21 1050  BP: (!) 142/79 (!) 144/82  Pulse: 72 70  Resp: (!) 21 18  Temp:  (!) 36.2 C  SpO2: 100% 98%    Last Pain:  Vitals:   01/12/21 1050  TempSrc:   PainSc: 2                  [name] DANIEL

## 2021-01-13 ENCOUNTER — Encounter (HOSPITAL_COMMUNITY): Payer: Self-pay | Admitting: Vascular Surgery

## 2021-01-13 NOTE — Progress Notes (Addendum)
  Progress Note    01/13/2021 7:28 AM 1 Day Post-Op  Subjective:  Says he has a little right hip pain since yesterday and has started to improve.  Feels like he may have laid in bed too long.    Afebrile HR 60's-80's NSR 062'I-948'N systolic 95 % RA  Vitals:   01/12/21 2344 01/13/21 0344  BP: 119/79 123/83  Pulse: 64 85  Resp: 18 20  Temp: 97.8 F (36.6 C) 97.6 F (36.4 C)  SpO2: 95% 93%    Physical Exam: Cardiac:  regular Lungs:  Non labored Incisions:  Right groin with wound vac with good seal.  Left groin has healed nicely. Extremities:  +doppler signals bilateral DP/PT   CBC    Component Value Date/Time   WBC 10.3 01/09/2021 0050   RBC 3.22 (L) 01/09/2021 0050   HGB 10.7 (L) 01/09/2021 0050   HCT 32.5 (L) 01/09/2021 0050   PLT 373 01/09/2021 0050   MCV 100.9 (H) 01/09/2021 0050   MCH 33.2 01/09/2021 0050   MCHC 32.9 01/09/2021 0050   RDW 15.9 (H) 01/09/2021 0050    BMET    Component Value Date/Time   NA 131 (L) 01/08/2021 0143   NA 134 10/13/2020 0741   K 4.5 01/08/2021 0143   CL 98 01/08/2021 0143   CO2 26 01/08/2021 0143   GLUCOSE 118 (H) 01/08/2021 0143   BUN 16 01/08/2021 0143   BUN 11 10/13/2020 0741   CREATININE 0.92 01/08/2021 0143   CALCIUM 9.2 01/08/2021 0143   GFRNONAA >60 01/08/2021 0143    INR    Component Value Date/Time   INR 1.0 01/04/2021 1638     Intake/Output Summary (Last 24 hours) at 01/13/2021 0728 Last data filed at 01/13/2021 0500 Gross per 24 hour  Intake 2190 ml  Output 715 ml  Net 1475 ml     Assessment/Plan:  70 y.o. male is s/p:  Right groin sartorius muscle flap with wound vac placement after right to left femoral to femoral bypass grafting with dacron on 11/04/2020 1 Day Post-Op   -pt doing well this morning.  He has +doppler signals bilateral DP/PT -wound vac right groin with good seal.  Will consult TOC for home vac. Left groin incision healed nicely.  -pt c/o right hip pain since yesterday that he says  has gotten a little better.  He is able to walk and mobilize.  -DVT prophylaxis:  Sq heparin   Leontine Locket, PA-C [facility] [phone] 01/13/2021 7:28 AM  I have independently interviewed and examined patient and agree with PA assessment and plan above.   Thorvald Orsino C. Donzetta Matters, MD [facility] [place] Office: [phone] Pager: [phone]

## 2021-01-13 NOTE — Care Management (Addendum)
Notified Leontine Locket w [facility] that patient will need a wound VAC for DC.  Order left on chart and Sam Rhyne notified to sign. When order is signed, CM please fax to [facility] at [phone] (order signed and faxed at 14:40)  Paged CSW at [phone], awaiting call back. [facility]   Olivia Mackie with [facility] will follow up with [facility] for wound VAC set up.  [facility] accepted for home health     Primary care at [facility], [phone] ext. [phone]), Prescilla Sours is CM with [facility]- plan for OR today for washout of infected groin.

## 2021-01-14 ENCOUNTER — Inpatient Hospital Stay: Payer: Self-pay

## 2021-01-14 DIAGNOSIS — A491 Streptococcal infection, unspecified site: Secondary | ICD-10-CM

## 2021-01-14 DIAGNOSIS — T827XXA Infection and inflammatory reaction due to other cardiac and vascular devices, implants and grafts, initial encounter: Secondary | ICD-10-CM | POA: Diagnosis not present

## 2021-01-14 LAB — C-REACTIVE PROTEIN: CRP: 3 mg/dL — ABNORMAL HIGH (ref <1.0)

## 2021-01-14 LAB — SEDIMENTATION RATE: Sed Rate: 56 mm/hr — ABNORMAL HIGH (ref 0–16)

## 2021-01-14 MED ORDER — SODIUM CHLORIDE 0.9% FLUSH
10.0000 mL | Freq: Two times a day (BID) | INTRAVENOUS | Status: DC
  Administered 2021-01-14 – 2021-01-15 (×2): 10 mL

## 2021-01-14 MED ORDER — CHLORHEXIDINE GLUCONATE CLOTH 2 % EX PADS
6.0000 | MEDICATED_PAD | Freq: Every day | CUTANEOUS | Status: DC
  Administered 2021-01-14 – 2021-01-15 (×2): 6 via TOPICAL

## 2021-01-14 MED ORDER — SODIUM CHLORIDE 0.9% FLUSH: 10.0000 mL | INTRAVENOUS | Status: DC | PRN

## 2021-01-14 MED ORDER — PENICILLIN G POTASSIUM 20000000 UNITS IJ SOLR
12.0000 10*6.[IU] | Freq: Two times a day (BID) | INTRAVENOUS | Status: DC
  Administered 2021-01-14 – 2021-01-15 (×3): 12 10*6.[IU] via INTRAVENOUS
  Filled 2021-01-14 (×4): qty 12

## 2021-01-14 NOTE — Progress Notes (Signed)
Mobility Specialist - Progress Note   01/14/21 1300  Mobility  Activity Ambulated in hall  Level of Assistance Standby assist, set-up cues, supervision of patient - no hands on  Assistive Device Other (Comment) (IV stand)  Distance Ambulated (ft) 450 ft  Mobility Ambulated with assistance in hallway  Mobility Response Tolerated well  Mobility performed by Mobility specialist  $Mobility charge 1 Mobility   Pt asx throughout ambulation. Pt to chair after walk, call bell at side. VSS throughout.  Pricilla Handler Mobility Specialist Mobility Specialist Phone: [phone]

## 2021-01-14 NOTE — Progress Notes (Addendum)
[facility] [place]  Subjective  - Pain with vac removal, otherwise no new complaints.   Objective 122/76 80 97.7 F (36.5 C) (Oral) 15 94%  Intake/Output Summary (Last 24 hours) at 01/14/2021 0730 Last data filed at 01/14/2021 0354 Gross per 24 hour  Intake 580.08 ml  Output 651 ml  Net -70.92 ml      Patient moving about room independently Lungs non labored breathing  Assessment/Planning: POD # 2 70 y.o. male is s/p:  Right groin sartorius muscle flap with wound vac placement after right to left femoral to femoral bypass grafting with dacron on 11/04/2020  Component 8 d ago  Specimen Description WOUND RIGHT GROIN   Special Requests PT ON ANCEF   Gram Stain MODERATE WBC PRESENT,BOTH PMN AND MONONUCLEAR  RARE GRAM POSITIVE COCCI   Culture FEW STREPTOCOCCUS AGALACTIAE  TESTING AGAINST S. AGALACTIAE NOT ROUTINELY PERFORMED DUE TO PREDICTABILITY OF AMP/PEN/VAN SUSCEPTIBILITY.  NO ANAEROBES ISOLATED  Performed at [facility], [place] [address], [place], [place] [address]   Report Status 01/11/2021 FINAL    Will plan on discharge once home vac approved, [facility] RN set up and will consult ID for home antibiotic suggestions.   Roxy Horseman 01/14/2021 7:30 AM --  Laboratory Lab Results: No results for input(s): WBC, HGB, HCT, PLT in the last 72 hours. BMET No results for input(s): NA, K, CL, CO2, GLUCOSE, BUN, CREATININE, CALCIUM in the last 72 hours.  COAG Lab Results  Component Value Date   INR 1.0 01/04/2021   INR 1.0 10/30/2020   No results found for: PTT   I have interviewed and examined patient with PA and agree with assessment and plan above.   Dylan Monforte C. Donzetta Matters, MD [facility] [place] Office: [phone] Pager: [phone]

## 2021-01-14 NOTE — TOC Progression Note (Signed)
Transition of Care [facility]) - Progression Note    Patient Details  Name: Yoav Okane MRN: [id number] Date of Birth: 04-10-1951  Transition of Care [facility]) CM/SW [place], RN Phone Number: 01/14/2021, 9:49 AM  Clinical Narrative:    Wound vac form signed and faxed. Contacted Carolynn Sayers from [facility] [facility] for home IV antibiotics. Already has [place] set up with [facility].  Onsulting with ID for antibiotics at home.   Expected Discharge Plan: [place] Barriers to Discharge: Continued Medical Work up  Expected Discharge Plan and Services Expected Discharge Plan: [place]   Discharge Planning Services: CM Consult Post Acute Care Choice: Durable Medical Equipment                   DME Arranged: Vac DME Agency: [facility] Date DME Agency Contacted: 01/13/21 Time DME Agency Contacted: [id number] Representative spoke with at DME Agency: Olivia Mackie HH Arranged: RN [place] Agency: [facility] Date [place]: 01/13/21 Time [place]: [place] Representative spoke with at [place]: [place] ([place]) Interventions    Readmission Risk Interventions No flowsheet data found.

## 2021-01-14 NOTE — Consult Note (Addendum)
I have seen and examined the patient. I have personally reviewed the clinical findings, laboratory findings, microbiological data and imaging studies. The assessment and treatment plan was discussed with the  Advance Practice Provider, Mauricio Po  I agree with her/his recommendations except following additions/corrections.  70 Y O Male with h/o PADD s/p  bilateral common femoral endarterectomies with bovine pericardial patch angioplasty and rt to left femoral to femoral bypass with dacron on 11/04/20 admitted for redness/swelling and tenderness at the Rt groin surgical site. No prior h/o PO antibiotics prior to admit. Former smoker, alcohol occasionally , no IVDU  Afebrile since admission , prior leukocytosis has resolved  S/p I and D of rt groin and placement of antibiotic impregnated beads on 5/25. OR cultures growing Strep agalactiae S/p Pulse evacuation of rt groin and rt groin sartorius muscle flap and application of negative pressure rt groin wound 01/12/21  All labs/imagings/microbiological data and OR notes reviewed   Plan to treat for vascular graft infection with 6 weeks of IV penicillin continuous infusion PICC line CBC, BMP, ESR and CRP weekly Discussed plan with the patient  A follow up with [facility] has been made Will sign off for now. Please call with questions   [name], [name] [facility] for Infectious Disease    Date of Admission:  01/04/2021     Total days of antibiotics 11               Reason for Consult: Post operative wound infection   Referring Provider: Dr. Donzetta Matters .  Primary Care Provider: [facility]   ASSESSMENT:  [name] is a 70 y/o male with PAD s/p bilateral endarterectomies with bovine pericardial patch angioplasty and right to left femoral to femoral artery bypass with Dacron graft placement on 3/23 now admitted with Streptococcus Agalactiae vascular graft  infection s/p debridement with retention of the graft. Change antibiotics to Penicllin. Will need prolonged course of 6 weeks IV therapy followed by 3-6 months or oral treatment. PICC line ordered and OPAT/Home Health orders below. Continue wound care per Vascular Surgery. Discussed plan of care including PICC line and need for prolonged antibiotics. Arrange follow up in ID office.   PLAN:  1. Change antibiotics to Penicillin 2. PICC line placement.  3. OPAT/Home Health orders 4. Continue wound care per Vascular Surgery 5. Arrange follow up in ID office.   Diagnosis: Vascular graft infection  Culture Result: Streptococcus Agalactiae   Allergies  Allergen Reactions  . Amlodipine Swelling and Other (See Comments)    Edema of lower extremities    OPAT Orders Discharge antibiotics to be given via PICC line Discharge antibiotics: Penicillin q 24 hour infusion Per pharmacy protocol   Duration: 6 weeks   End Date: 02/23/21  [facility] Care Per Protocol:  Home health RN for IV administration and teaching; PICC line care and labs.    Labs weekly while on IV antibiotics: _X_ CBC with differential _X_ BMP __ CMP _X_ CRP _X_ ESR __ Vancomycin trough __ CK  _X_ Please pull PIC at completion of IV antibiotics __ Please leave PIC in place until doctor has seen patient or been notified  Fax weekly labs to [phone]  Clinic Follow Up Appt:  10:30 am on 6/23 with Dr. West Bali   Principal Problem:   Infection of vascular bypass graft, initial encounter [name]) Active Problems:   GBS (group B streptococcus) infection  PAD (peripheral artery disease) ([place])   . aspirin EC  81 mg Oral Daily  . benazepril  10 mg Oral QHS  . cilostazol  100 mg Oral BID  . docusate sodium  100 mg Oral BID  . heparin  5,000 Units Subcutaneous Q8H  . metoprolol  200 mg Oral Daily  . mometasone-formoterol  2 puff Inhalation BID  . niacin  100 mg Oral QHS  . pantoprazole  40 mg Oral BID  .  rosuvastatin  10 mg Oral Daily  . sodium chloride flush  3 mL Intravenous Q12H  . sodium chloride flush  3 mL Intravenous Q12H  . spironolactone  25 mg Oral Daily  . thiamine  100 mg Oral Daily  . umeclidinium bromide  1 puff Inhalation Daily     HPI: Sean [name] is a 70 y.o. male with previous medical history of hypertension, COPD, sleep apnea, and PAD s/p bilateral common femoral endarterectomies with bovine pericardial patch angioplasty and right to left femoral to femoral artery bypass with Dacron graft admitted for with redness, warmth and tenderness of the right groin and concern for post-operative infection.   Mr. Barriere initially underwent bilateral common femoral endarterectomies with bovine pericardial patch angioplasty and right to left femoral to femoral artery bypass with Dacron graft on 11/04/20. Recovery course progressed without complication until about 2-3 days prior to admission when he developed redness, warmth, and tenderness of the right groin.  CT abdomen/pelvis with right groin fluid collection about the right aspect of femoral-femoral bypass measuring 4.9 x 1.9 x 3.9 cm suspicious for abscess. I&D of the right groin on 5/25 encountered about 30cc of purulent drainage upon entering subcutaneous tissue. A tunnel tract down to the anterior surface of the previously placed dacryon graft. There was a tract that went down towards the scrotum that was also opened up. Vancomycin and gentamycin beads were placed with betadine soaked gauze. Returned to the OR on 5/31 for right groin sartorius muscle flap and application of wound VAC.   Mr. Meinhardt has been afebrile since admission. Has received 11 days of antimicrobial therapy since admission including 3 days of Cefazolin (5/23-5/25); 4 days of Pip/tazo (5/25-5/28); 5 days of vancomycin (5/25-5/29); and 4 days of ampicillin (5/29-6/2). Surgical specimens on 5/25 with rare gram positive cocci on gram stain and few Streptococcus Agalactiae on  culture. ID has been asked to make recommendations for antibiotics.  Mr. Niblack is feeling well and denies fevers. Has no pain and rest and has some discomfort with mobility. Several questions regarding home care.   Review of Systems: Review of Systems  Constitutional: Negative for chills, fever and weight loss.  Respiratory: Negative for cough, shortness of breath and wheezing.   Cardiovascular: Negative for chest pain and leg swelling.  Gastrointestinal: Negative for abdominal pain, constipation, diarrhea, nausea and vomiting.  Skin: Negative for rash.     Past Medical History:  Diagnosis Date  . Arthritis   . COPD (chronic obstructive pulmonary disease) ([place])   . GERD (gastroesophageal reflux disease)   . Hyperlipidemia   . Hypertension   . Sleep apnea     Social History   Tobacco Use  . Smoking status: Former Smoker    Types: Cigarettes    Quit date: 06/25/2013    Years since quitting: 7.5  . Smokeless tobacco: Former Systems developer    Quit date: 06/25/1976  Vaping Use  . Vaping Use: Never used  Substance Use Topics  . Alcohol use: Yes  Alcohol/week: 30.0 standard drinks    Types: 30 Cans of beer per week  . Drug use: No    Family History  Problem Relation Age of Onset  . COPD Mother   . Diabetes Mother   . Hypertension Father   . COPD Father   . Heart disease Father   . Arthritis Sister     Allergies  Allergen Reactions  . Amlodipine Swelling and Other (See Comments)    Edema of lower extremities    OBJECTIVE: Blood pressure (!) 159/98, pulse (!) 102, temperature 97.6 F (36.4 C), temperature source Oral, resp. rate 20, height _0  (1.676 m), weight 92.5 kg, SpO2 94 %.  Physical Exam Constitutional:      General: He is not in acute distress.    Appearance: He is well-developed.     Comments: Lying in bed with head of bed elevated; pleasant.   Cardiovascular:     Rate and Rhythm: Normal rate and regular rhythm.     Heart sounds: Normal heart sounds.   Pulmonary:     Effort: Pulmonary effort is normal.     Breath sounds: Normal breath sounds.  Skin:    General: Skin is warm and dry.     Comments: Wound vac located in right groin is with appropriate suction. There is no drainage in the VAC.   Neurological:     Mental Status: He is alert.  Psychiatric:        Mood and Affect: Mood normal.     Lab Results Lab Results  Component Value Date   WBC 10.3 01/09/2021   HGB 10.7 (L) 01/09/2021   HCT 32.5 (L) 01/09/2021   MCV 100.9 (H) 01/09/2021   PLT 373 01/09/2021    Lab Results  Component Value Date   CREATININE 0.92 01/08/2021   BUN 16 01/08/2021   NA 131 (L) 01/08/2021   K 4.5 01/08/2021   CL 98 01/08/2021   CO2 26 01/08/2021    Lab Results  Component Value Date   ALT 29 01/04/2021   AST 29 01/04/2021   ALKPHOS 76 01/04/2021   BILITOT 0.8 01/04/2021     Microbiology: Recent Results (from the past 240 hour(s))  SARS Coronavirus 2 by RT PCR (hospital order, performed in [facility] hospital lab) Nasopharyngeal Nasopharyngeal Swab     Status: None   Collection Time: 01/06/21 10:56 AM   Specimen: Nasopharyngeal Swab  Result Value Ref Range Status   SARS Coronavirus 2 NEGATIVE NEGATIVE Final    Comment: Performed at [facility], [place] [address] [place], [place] [address]  Aerobic/Anaerobic Culture w Gram Stain (surgical/deep wound)     Status: None   Collection Time: 01/06/21  3:03 PM   Specimen: PATH Other; Tissue  Result Value Ref Range Status   Specimen Description WOUND RIGHT GROIN  Final   Special Requests PT ON ANCEF  Final   Gram Stain   Final    MODERATE WBC PRESENT,BOTH PMN AND MONONUCLEAR RARE GRAM POSITIVE COCCI    Culture   Final    FEW STREPTOCOCCUS AGALACTIAE TESTING AGAINST S. AGALACTIAE NOT ROUTINELY PERFORMED DUE TO PREDICTABILITY OF AMP/PEN/VAN SUSCEPTIBILITY. NO ANAEROBES ISOLATED Performed at [facility], [place] [address], [place], [place] [address]    Report Status 01/11/2021  FINAL  Final  Acid Fast Smear (AFB)     Status: None   Collection Time: 01/06/21  3:03 PM   Specimen: PATH Other; Tissue  Result Value Ref Range Status  AFB Specimen Processing Concentration  Final   Acid Fast Smear Negative  Final    Comment: (NOTE) Performed At: [facility] [place], [place] [id number] Rush Farmer MD GG:[id number]    Source (AFB) WOUND  Final    Comment: RIGHT GROIN Performed at [facility], [place] [address] [place], [place] [address]      CT abdomen pelvis W contrast 01/05/21 FINDINGS: Lower chest: Lung bases are clear. No acute airspace disease or pleural effusion. Coronary artery calcifications.  Hepatobiliary: No focal liver abnormality is seen. No gallstones, gallbladder wall thickening, or biliary dilatation.  Pancreas: No ductal dilatation or inflammation.  Spleen: Normal in size without focal abnormality.  Adrenals/Urinary Tract: No adrenal nodule. No hydronephrosis. Symmetric bilateral perinephric edema typically chronic. Homogeneous renal enhancement with symmetric excretion on delayed phase imaging. No focal renal abnormality. No renal calculi. Partially distended urinary bladder without wall thickening.  Stomach/Bowel: Unremarkable stomach. Fecalization of small bowel contents without obstruction, inflammation or wall thickening. Normal appendix. Moderate colonic stool burden.  Vascular/Lymphatic: Dense aortic atherosclerosis. No aortic aneurysm or aortic stenosis. Moderate atherosclerosis at the origin of the celiac and SMA, branch vessels are patent. 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 External iliac arteries are calcified and small in caliber. There is a femoral femoral bypass graft. Fluid collection about the right groin bypass graft measures 4.9 x 1.9 x 3.9 cm. Collection is thick walled with adjacent inflammatory changes extending into the  subcutaneous tissues of the upper scrotum. There is no internal or surrounding soft tissue air. No other fluid collection. The left groin anastomosis appears patent. There prominent bilateral inguinal lymph nodes, typically reactive. Prominent bilateral external iliac nodes. Small left periaortic nodes are not enlarged by size criteria.  Reproductive: Prostate is unremarkable.  Other: Fat stranding and inflammation in the right groin adjacent to fluid collection, extends into the upper aspect of the right scrotum. There is soft tissue edema of the lower pelvic soft tissues. Fat containing bilateral inguinal hernias. No abdominopelvic ascites or intra-abdominopelvic fluid collection.  Musculoskeletal: Multilevel degenerative change in the spine. Small sclerotic focus inferior endplate of X38. There are no acute or suspicious osseous abnormalities.  IMPRESSION: 1. Right groin fluid collection about the right aspect of femoral femoral bypass graft measuring 4.9 x 1.9 x 3.9 cm. Collection is thick walled with adjacent inflammatory changes extending into the subcutaneous tissues of the upper scrotum. While there is no internal air, findings are suspicious for abscess. 2. Patent left groin anastomosis. 3. Prominent bilateral inguinal and external iliac lymph nodes are typically reactive.  Aortic Atherosclerosis (ICD10-I70.0).   Terri Piedra, NP [place] for Infectious Disease [facility]  01/14/2021  10:18 AM

## 2021-01-14 NOTE — Consult Note (Signed)
WOC Nurse Consult Note: Reason for Consult: Re-apply wound vac in right groin Wound type: Surgical  Pressure Injury POA: NA Measurement: 6 cm x 2.5 cm x 2 cm Wound bed: beefy red, moist with some bleeding.  Drainage (amount, consistency, odor)  Periwound: Intact Dressing procedure/placement/frequency: Placed one piece of black foam into the wound. Outlined the wound with barrier ring to prevent leakage. Applied drape, immediate seal obtained at 125 mmHg. [employer]CI home vac has been ordered. Dressing does not need to be changed to switch out from the hospital vac to home vac. Dressing change due on Saturday. If provider wishes to have vac changes on MWF then the dressing change will need to be completed on Monday.   Thank you for the consult. [facility] nurse will not follow at this time.   Please re-consult the [facility] team if needed.  Cathlean Marseilles Tamala Julian, MSN, RN, [place], [place]le Pearl, [facility] Wound Treatment Associate Pager [phone]

## 2021-01-14 NOTE — Progress Notes (Signed)
PHARMACY CONSULT NOTE FOR:  OUTPATIENT  PARENTERAL ANTIBIOTIC THERAPY (OPAT)  Indication: Vascular graft infection Regimen: Penicillin G 24 million units every 24 hours  End date: 02/23/21   IV antibiotic discharge orders are pended. To discharging provider:  please sign these orders via discharge navigator,  Select New Orders & click on the button choice - Manage This Unsigned Work.     Thank you for allowing pharmacy to be a part of this patient's care.  Jimmy Footman, PharmD, BCPS, BCIDP Infectious Diseases Clinical Pharmacist Phone: [phone] 01/14/2021, 10:40 AM

## 2021-01-14 NOTE — Progress Notes (Signed)
Peripherally Inserted Central Catheter Placement  The IV Nurse has discussed with the patient and/or persons authorized to consent for the patient, the purpose of this procedure and the potential benefits and risks involved with this procedure.  The benefits include less needle sticks, lab draws from the catheter, and the patient may be discharged home with the catheter. Risks include, but not limited to, infection, bleeding, blood clot (thrombus formation), and puncture of an artery; nerve damage and irregular heartbeat and possibility to perform a PICC exchange if needed/ordered by physician.  Alternatives to this procedure were also discussed.  Bard Power PICC patient education guide, fact sheet on infection prevention and patient information card has been provided to patient /or left at bedside.    PICC Placement Documentation  PICC Single Lumen 01/14/21 Right Brachial 39 cm 0 cm (Active)  Indication for Insertion or Continuance of Line Home intravenous therapies (PICC only) 01/14/21 1142  Exposed Catheter (cm) 0 cm 01/14/21 1142  Site Assessment Clean;Dry;Intact 01/14/21 1142  Line Status Flushed;Blood return noted;Saline locked 01/14/21 1142  Dressing Type Transparent 01/14/21 1142  Dressing Status Clean;Dry;Intact 01/14/21 1142  Antimicrobial disc in place? Yes 01/14/21 1142  Dressing Intervention New dressing;Other (Comment) 01/14/21 1142  Dressing Change Due 01/21/21 01/14/21 1142       Christella Noa Albarece 01/14/2021, 11:43 AM

## 2021-01-15 ENCOUNTER — Other Ambulatory Visit (HOSPITAL_COMMUNITY): Payer: Self-pay

## 2021-01-15 LAB — SEDIMENTATION RATE: Sed Rate: 51 mm/hr — ABNORMAL HIGH (ref 0–16)

## 2021-01-15 LAB — C-REACTIVE PROTEIN: CRP: 3.1 mg/dL — ABNORMAL HIGH (ref <1.0)

## 2021-01-15 MED ORDER — PENICILLIN G POTASSIUM IV (FOR PTA / DISCHARGE USE ONLY): 24.0000 10*6.[IU] | INTRAVENOUS | 0 refills | Status: AC

## 2021-01-15 MED ORDER — OXYCODONE-ACETAMINOPHEN 5-325 MG PO TABS
1.0000 | ORAL_TABLET | Freq: Four times a day (QID) | ORAL | 0 refills | Status: AC | PRN
  Filled 2021-01-15: qty 20, 5d supply, fill #0

## 2021-01-15 NOTE — Progress Notes (Addendum)
  Progress Note    01/15/2021 7:40 AM 3 Days Post-Op  Subjective:  Ready for discharge home   Vitals:   01/14/21 2300 01/15/21 0505  BP: 131/73 116/79  Pulse: 63 86  Resp: 20 16  Temp: 97.8 F (36.6 C) 98.1 F (36.7 C)  SpO2: 97% 93%   Physical Exam: Lungs:  Non labored Incisions:  Wound vac in place R groin Extremities:  Feet warm and well perfused Neurologic: A&O  CBC    Component Value Date/Time   WBC 10.3 01/09/2021 0050   RBC 3.22 (L) 01/09/2021 0050   HGB 10.7 (L) 01/09/2021 0050   HCT 32.5 (L) 01/09/2021 0050   PLT 373 01/09/2021 0050   MCV 100.9 (H) 01/09/2021 0050   MCH 33.2 01/09/2021 0050   MCHC 32.9 01/09/2021 0050   RDW 15.9 (H) 01/09/2021 0050    BMET    Component Value Date/Time   NA 131 (L) 01/08/2021 0143   NA 134 10/13/2020 0741   K 4.5 01/08/2021 0143   CL 98 01/08/2021 0143   CO2 26 01/08/2021 0143   GLUCOSE 118 (H) 01/08/2021 0143   BUN 16 01/08/2021 0143   BUN 11 10/13/2020 0741   CREATININE 0.92 01/08/2021 0143   CALCIUM 9.2 01/08/2021 0143   GFRNONAA >60 01/08/2021 0143    INR    Component Value Date/Time   INR 1.0 01/04/2021 1638     Intake/Output Summary (Last 24 hours) at 01/15/2021 0740 Last data filed at 01/14/2021 2100 Gross per 24 hour  Intake 602.32 ml  Output 100 ml  Net 502.32 ml     Assessment/Plan:  70 y.o. male is s/p washout of infected R groin with subsequent sartorius muscle flap and vac placement 3 Days Post-Op   Feet warm and well perfused Wound vac in place with good seal PICC line placed; penicillin per ID TOC has arranged HH for antibiotics as well as RN for wound vac changes; home vac in room [facility] for discharge this morning; follow up for wound check in 2 weeks   Dagoberto Ligas, PA-C [facility] [phone] 01/15/2021 7:40 AM   I have interviewed and examined patient with PA and agree with assessment and plan above. Plan as above. I will see him back in 2 weeks unless there  are wound concerns prior.   Danniel Grenz C. Donzetta Matters, MD [facility] [place] Office: [phone] Pager: [phone]

## 2021-01-15 NOTE — Discharge Instructions (Signed)
Negative Pressure Wound Therapy Home Guide Negative pressure wound therapy (NPWT) uses a sponge or foam-like material (dressing) placed on or inside the wound. The wound is then covered and sealed with a cover dressing that sticks to your skin (is adhesive). This keeps air out. A tube is attached to the cover dressing, and this tube connects to a small pump. The pump sucks fluid and germs from the wound. NPWT helps to increase blood flow to the wound and heal it from the inside. What are the risks? NPWT is usually safe to use. However, problems can occur, including:  Skin irritation from the dressing adhesive.  Bleeding.  Infection.  Dehydration. Wounds with large amounts of drainage can cause excessive fluid loss.  Pain. Supplies needed:  A disposable garbage bag.  Soap and water, or hand sanitizer.  Wound cleanser or salt-water solution (saline).  New sponge and cover dressing.  Protective clothing.  Gauze pad.  Vinyl gloves.  Tape.  Skin protectant. This may be a wipe, film, or spray.  Clean or germ-free (sterile) scissors.  Eye protection. How to change your dressing Prepare to change your dressing 1. If told by your health care provider, take pain medicine 30 minutes before changing the dressing. 2. Wash your hands with soap and water. Dry your hands with a clean towel. If soap and water are not available, use hand sanitizer. 3. Set up a clean station for wound care. 4. Open the dressing package so that the sponge dressing remains on the inside of the package. 5. Wear gloves, protective clothing, and eye protection.   Remove old dressing 1. Turn off the pump and disconnect the tubing from the dressing. 2. Carefully remove the adhesive cover dressing in the direction of your hair growth. 3. Remove the sponge dressing that is inside the wound. If the sponge sticks, use a wound cleanser or saline solution to wet the sponge and help it come off more easily. 4. Throw  the old sponge and cover dressing supplies into the garbage bag. 5. Remove your gloves by grabbing the cuff and turning the glove inside out. Place the gloves in the trash immediately. 6. Wash your hands with soap and water. Dry your hands with a clean towel. If soap and water are not available, use hand sanitizer.   Clean your wound  Wear gloves, protective clothing, and eye protection. Follow your health care provider's instructions on how to clean your wound. You may be told to: 1. Clean the wound using a saline solution or a wound cleanser and a clean gauze pad. 2. Pat the wound dry with a gauze pad. Do not rub the wound. 3. Throw the gauze pad into the garbage bag. 4. Remove your gloves by grabbing the cuff and turning the glove inside out. Place the gloves in the trash immediately. 5. Wash your hands with soap and water. Dry your hands with a clean towel. If soap and water are not available, use hand sanitizer. Apply new dressing  Wear gloves, protective clothing, and eye protection. 1. If told by your health care provider, apply a skin protectant to any skin that will be exposed to adhesive. Let the skin protectant dry. 2. Cut a piece of new sponge dressing and put it on or in the wound. 3. Using clean scissors, cut a nickel-sized hole in the new cover dressing. 4. Apply the cover dressing. 5. Attach the suction tube over the hole in the cover dressing. 6. Take off your gloves. Put them  in the plastic bag with the old dressing. Tie the bag shut and throw it away. 7. Wash your hands with soap and water. Dry your hands with a clean towel. If soap and water are not available, use hand sanitizer. 8. Turn the pump back on. The sponge dressing should collapse. Do not change the settings on the machine without talking to a health care provider. 9. Replace the container in the pump that collects fluid if it is full. Replace the container per the manufacturer's instructions or at least once a  week, even if it is not full. General tips and recommendations If the alarm sounds:  Stay calm.  Do not turn off the pump or do anything with the dressing.  Reasons the alarm may go off: ? The battery is low. Change the battery or plug the device into electrical power. ? The dressing has a leak. Find the leak and put tape over the leak. ? The fluid collection container is full. Change the fluid container.  Call your health care provider right away if you cannot fix the problem.  Explain to your health care provider what is happening. Follow his or her instructions. General instructions  Do not turn off the pump unless told to do so by your health care provider.  Do not turn off the pump for more than 2 hours. If the pump is off for more than 2 hours, the dressing will need to be changed.  If your health care provider says it is okay to shower: ? Do not take the pump into the shower. ? Make sure the wound dressing is protected and sealed. The wound dressing must stay dry.  Check frequently that the machine indicates that therapy is on and that all clamps are open.  Do not use over-the-counter medicated or antiseptic creams, sprays, liquids, or dressings unless your health care provider approves. Contact a health care provider if:  You have new pain.  You develop irritation, a rash, or itching around the wound or dressing.  You see new black or yellow tissue in your wound.  The dressing changes are painful or cause bleeding.  The pump has been off for more than 2 hours, and you do not know how to change the dressing.  The pump alarm goes off, and you do not know what to do. Get help right away if:  You have a lot of bleeding.  The wound breaks open.  You have severe pain.  You have signs of infection, such as: ? More redness, swelling, or pain. ? More fluid or blood. ? Warmth. ? Pus or a bad smell. ? Red streaks leading from the wound. ? A fever.  You see a  sudden change in the color or texture of the drainage.  You have signs of dehydration, such as: ? Little or no tears, urine, or sweat. ? Muscle cramps. ? Very dry mouth. ? Headache. ? Dizziness. Summary  Negative pressure wound therapy (NPWT) is a device that helps your wound heal.  Set up a clean station for wound care. Your health care provider will tell you what supplies to use.  Follow your health care provider's instructions on how to clean your wound and how to change the dressing.  Contact a health care provider if you have new pain, an irritation, or a rash, or if the alarm goes off and you do not know what to do.  Get help right away if you have a lot of bleeding, your  wound breaks open, or you have severe pain. Also, get help if you have signs of infection. This information is not intended to replace advice given to you by your health care provider. Make sure you discuss any questions you have with your health care provider. Document Revised: 10/07/2019 Document Reviewed: 10/19/2018 Elsevier Patient Education  [place].

## 2021-01-15 NOTE — Care Management (Signed)
[place], [place] [facility] [address] [place] [address] Telephone: [phone] Fax: [phone] Hours: Not open 24 hours     Printed (1 of 1)    penicillin G IVPB  Sig: Inject 24 Million Units into the vein daily. As a continuous infusion. Indication: Vascular graft infection  First Dose: Yes  Last Day of Therapy: 02/23/21  Labs - Once weekly: CBC/D and BMP,  Labs - Every other week: ESR and CRP  Method of administration: Elastomeric (Continuous infusion)  Method of administration may be changed at the discretion of home infusion pharmacist based upon assessment of the patient and/or caregiver's ability to self-administer the medication ordered.    Start: 01/15/21    Quantity: 40 Units Refills: 0        For home use only DME Negative pressure wound device  Once       Question Answer Comment  Frequency of dressing change 2 times per week   Length of need Other see comments   Dressing type Foam   Amount of suction 125 mm/Hg   Pressure application Continuous pressure   Supplies 10 canisters and 15 dressings per month for duration of therapy      01/13/21 0754              Discharge Care Instructions  (From admission, onward)           Start     Ordered   01/15/21 0000  Change dressing on IV access line weekly and PRN  (Home infusion instructions - [facility] )        01/15/21 0755

## 2021-01-15 NOTE — TOC Progression Note (Addendum)
  Transition of Care [facility]) - Progression Note    Patient Details  Name: Sean [name] MRN: [id number] Date of Birth: 1951-05-07  Transition of [facility]) CM/SW [place], RN Phone Number: 01/15/2021, 10:23 AM  Clinical Narrative:    Spoke with RN and Pam from [place]. She had already spoken to the [place]. They are getting authorization for medication asap, then she will come and educate patient. [facility]) is aware of DC. Patient will be DC after education done for IV abx.   Faxed orders to [place] at  [facility] , PICC and wound vac orders.   61 Pam contacted as  The patient has received medications but  Not education  Expected Discharge Plan: [place] Barriers to Discharge: Continued Medical Work up  Expected Discharge Plan and Services Expected Discharge Plan: [place]   Discharge Planning Services: CM Consult Post Acute Care Choice: Durable Medical Equipment   Expected Discharge Date: 01/15/21               DME Arranged: Harlin Heys DME Agency: Soyla Murphy Date DME Agency Contacted: 01/13/21 Time DME Agency Contacted: [id number] Representative spoke with at DME Agency: Olivia Mackie HH Arranged: RN [place] Agency: [facility] Date [place]: 01/13/21 Time [place]: [place] Representative spoke with at [place]: [place] ([place]) Interventions    Readmission Risk Interventions No flowsheet data found.

## 2021-01-15 NOTE — Progress Notes (Addendum)
Pharmacist Discharge Medication Review   Be sure to document Level of Significance as well as all Outcomes that apply.  Recommendation(s) / modification(s): Stopped  Advair inhaler (fluticasone/salmeterol 250-50 mcg/dose AEPB) due to duplicate medication.  Prescribed to continue Wixela Inhub 250-48mg/act AEPB (fluticasone/salmeterol)  Stopped Cefazolin IVPB 2g [id number] duplicate medcation.  Antibiotic was changed to IV Penicillin G IVPB  25 million units as continuous infusion daily.   Provider contacted:  paged  MDagoberto Ligas PA-C>  Responded.  Pharmacist stopped/ remove orders from discharge medication list as noted above.   Communicated with the following individuals regarding these changes: Nurse  Patient is being discharged to Home  [facility]  Time spent in review, documentation, and communication:  15 minutes  Allergies as of 01/15/2021       Reactions   Amlodipine Swelling, Other (See Comments)   Edema of lower extremities        Medication List     TAKE these medications    albuterol (2.5 MG/3ML) 0.083% nebulizer solution Commonly known as: PROVENTIL Take 2.5 mg by nebulization every 6 (six) hours as needed for wheezing or shortness of breath.   albuterol 108 (90 Base) MCG/ACT inhaler Commonly known as: VENTOLIN HFA Inhale 2 puffs into the lungs every 6 (six) hours as needed for wheezing or shortness of breath.   aspirin EC 81 MG tablet Take 81 mg by mouth at bedtime. Swallow whole.   benazepril 10 MG tablet Commonly known as: LOTENSIN Take 1 tablet (10 mg total) by mouth at bedtime.   THERATEARS PF OP Place 2 drops into both eyes 3 (three) times daily.   carboxymethylcellulose 0.5 % Soln Commonly known as: REFRESH PLUS Place 1 drop into both eyes 3 (three) times daily.   cholecalciferol 1000 units tablet Commonly known as: VITAMIN D Take 1,000 Units by mouth 2 (two) times daily.    Vitamin D3 25 MCG (1000 UT) Caps Take 1,000 Units by mouth 2 (two) times daily.   cilostazol 100 MG tablet Commonly known as: PLETAL Take 100 mg by mouth 2 (two) times daily.   erythromycin ophthalmic ointment Apply to eye.   ferrous sulfate 325 (65 FE) MG tablet Take 1 tablet by mouth daily.   Fish Oil 1000 MG Caps Take 1,000 mg by mouth daily.   folic acid 1 MG tablet Commonly known as: FOLVITE Take 1 mg by mouth daily.   hydrocortisone 2.5 % cream Apply 1 application topically 3 (three) times daily as needed (itching). rectally   hydrocortisone 2.5 % rectal cream Commonly known as: ANUSOL-HC Place 1 application rectally 3 (three) times daily as needed for hemorrhoids or anal itching.   LACTOBACILLUS PO Take 2 capsules by mouth daily.   Magnesium Oxide 420 MG Tabs Take 420 mg by mouth every other day.   meloxicam 15 MG tablet Commonly known as: MOBIC Take 15 mg by mouth daily.   MENTHOL-METHYL SALICYLATE EX Apply 1 application topically 2 (two) times daily as needed (for knee pain).   metoprolol 200 MG 24 hr tablet Commonly known as: TOPROL-XL Take 200 mg by mouth daily.   multivitamin with minerals tablet Take 1 tablet by mouth daily.   niacin 100 MG tablet Take 100 mg by mouth in the morning.   oxyCODONE-acetaminophen 5-325 MG tablet Commonly known as: Percocet Take 1 tablet by mouth every 6 (six) hours as needed for severe pain.   pantoprazole 40 MG tablet Commonly  known as: PROTONIX Take 40 mg by mouth 2 (two) times daily.   penicillin G  IVPB Inject 24 Million Units into the vein daily. As a continuous infusion. Indication:  Vascular graft infection  First Dose: Yes Last Day of Therapy:  02/23/21 Labs - Once weekly:  CBC/D and BMP, Labs - Every other week:  ESR and CRP Method of administration: Elastomeric (Continuous infusion) Method of administration may be changed at the discretion of home infusion pharmacist based upon assessment of the  patient and/or caregiver's ability to self-administer the medication ordered.   rosuvastatin 20 MG tablet Commonly known as: CRESTOR Take 10 mg by mouth daily.   spironolactone 25 MG tablet Commonly known as: ALDACTONE Take 25 mg by mouth daily.   thiamine 100 MG tablet Take 100 mg by mouth daily.   tiotropium 18 MCG inhalation capsule Commonly known as: SPIRIVA Place 18 mcg into inhaler and inhale daily.   Wixela Inhub 250-50 MCG/ACT Aepb Generic drug: fluticasone-salmeterol Inhale 1 puff into the lungs in the morning and at bedtime.               Durable Medical Equipment  (From admission, onward)           Start     Ordered   01/13/21 0741  For home use only DME Negative pressure wound device  Once       Question Answer Comment  Frequency of dressing change 2 times per week   Length of need Other see comments   Dressing type Foam   Amount of suction 125 mm/Hg   Pressure application Continuous pressure   Supplies 10 canisters and 15 dressings per month for duration of therapy      01/13/21 0754              Discharge Care Instructions  (From admission, onward)           Start     Ordered   01/15/21 0000  Change dressing on IV access line weekly and PRN  (Home infusion instructions - [facility] )        01/15/21 0755

## 2021-01-19 NOTE — Discharge Summary (Signed)
Discharge Summary  Patient ID: Sean [name] [id number] 70 y.o. 04-May-1951  Admit date: 01/04/2021  Discharge date and time: 01/15/2021  6:32 PM   Admitting Physician: Waynetta Sandy, MD   Discharge Physician: same  Admission Diagnoses: PAD (peripheral artery disease) [facility]) [I73.9]  Discharge Diagnoses: infected prosthetic graft  Admission Condition: fair  Discharged Condition: fair  Indication for Admission: infected prosthetic graft  Hospital Course: Mr. Larence Thone is a 70 year old male who was brought in as an outpatient due to infected right groin.  He was brought to the operating room by Dr. Trula Slade on 01/06/2021 and underwent I&D of right groin with Pulsavac.  Anastomosis of right to left femoral-femoral bypass was exposed at the wound bed.  Over the next several days he underwent twice daily wet-to-dry dressing changes.  He was then brought back to the operating room by Dr. Donzetta Matters on 01/12/2021 and underwent Pulsavac and sartorius muscle flap with application of wound VAC.  Intraoperative cultures grewStrep agalactiae Strep agalactiae.  Infectious disease was consulted and recommended penicillin for a duration of 6 weeks.  PICC line was placed, followed every transition of care was consulted to arrange health RN with VAC change 3 times a week.  At the time of discharge healthy without any purulence.  Patient will follow up in 2 to 3 weeks for wound check.  He will be prescribed 2 to 3 days of narcotic pain medication for continued postoperative pain control.  He was discharged home in stable condition.  Consults: ID  Treatments: surgery: I&D of right groin with Pulsavac by Dr. Trula Slade on 01/06/2021 Pulsavac right groin and sartorius muscle flap by Dr. Donzetta Matters on 01/12/2021  Discharge Exam: See progress note 01/15/21 Vitals:   01/15/21 1224 01/15/21 1715  BP: 135/75 (!) 154/84  Pulse: 65 69  Resp: 20 18  Temp: (!) 97.3 F (36.3 C) 97.9 F (36.6 C)  SpO2: 98% 98%      Disposition: Discharge disposition: 01-Home or Self Care       Patient Instructions:  Allergies as of 01/15/2021      Reactions   Amlodipine Swelling, Other (See Comments)   Edema of lower extremities      Medication List    TAKE these medications   albuterol (2.5 MG/3ML) 0.083% nebulizer solution Commonly known as: PROVENTIL Take 2.5 mg by nebulization every 6 (six) hours as needed for wheezing or shortness of breath.   albuterol 108 (90 Base) MCG/ACT inhaler Commonly known as: VENTOLIN HFA Inhale 2 puffs into the lungs every 6 (six) hours as needed for wheezing or shortness of breath.   aspirin EC 81 MG tablet Take 81 mg by mouth at bedtime. Swallow whole.   benazepril 10 MG tablet Commonly known as: LOTENSIN Take 1 tablet (10 mg total) by mouth at bedtime.   THERATEARS PF OP Place 2 drops into both eyes 3 (three) times daily.   carboxymethylcellulose 0.5 % Soln Commonly known as: REFRESH PLUS Place 1 drop into both eyes 3 (three) times daily.   cholecalciferol 1000 units tablet Commonly known as: VITAMIN D Take 1,000 Units by mouth 2 (two) times daily.   Vitamin D3 25 MCG (1000 UT) Caps Take 1,000 Units by mouth 2 (two) times daily.   cilostazol 100 MG tablet Commonly known as: PLETAL Take 100 mg by mouth 2 (two) times daily.   erythromycin ophthalmic ointment Apply to eye.   ferrous sulfate 325 (65 FE) MG tablet Take 1 tablet by mouth daily.   Fish  Oil 1000 MG Caps Take 1,000 mg by mouth daily.   folic acid 1 MG tablet Commonly known as: FOLVITE Take 1 mg by mouth daily.   hydrocortisone 2.5 % cream Apply 1 application topically 3 (three) times daily as needed (itching). rectally   hydrocortisone 2.5 % rectal cream Commonly known as: ANUSOL-HC Place 1 application rectally 3 (three) times daily as needed for hemorrhoids or anal itching.   LACTOBACILLUS PO Take 2 capsules by mouth daily.   Magnesium Oxide 420 MG Tabs Take 420 mg by  mouth every other day.   meloxicam 15 MG tablet Commonly known as: MOBIC Take 15 mg by mouth daily.   MENTHOL-METHYL SALICYLATE EX Apply 1 application topically 2 (two) times daily as needed (for knee pain).   metoprolol 200 MG 24 hr tablet Commonly known as: TOPROL-XL Take 200 mg by mouth daily.   multivitamin with minerals tablet Take 1 tablet by mouth daily.   niacin 100 MG tablet Take 100 mg by mouth in the morning.   oxyCODONE-acetaminophen 5-325 MG tablet Commonly known as: Percocet Take 1 tablet by mouth every 6 (six) hours as needed for severe pain.   pantoprazole 40 MG tablet Commonly known as: PROTONIX Take 40 mg by mouth 2 (two) times daily.   penicillin G  IVPB Inject 24 Million Units into the vein daily. As a continuous infusion. Indication:  Vascular graft infection  First Dose: Yes Last Day of Therapy:  02/23/21 Labs - Once weekly:  CBC/D and BMP, Labs - Every other week:  ESR and CRP Method of administration: Elastomeric (Continuous infusion) Method of administration may be changed at the discretion of home infusion pharmacist based upon assessment of the patient and/or caregiver's ability to self-administer the medication ordered.   rosuvastatin 20 MG tablet Commonly known as: CRESTOR Take 10 mg by mouth daily.   spironolactone 25 MG tablet Commonly known as: ALDACTONE Take 25 mg by mouth daily.   thiamine 100 MG tablet Take 100 mg by mouth daily.   tiotropium 18 MCG inhalation capsule Commonly known as: SPIRIVA Place 18 mcg into inhaler and inhale daily.   Wixela Inhub 250-50 MCG/ACT Aepb Generic drug: fluticasone-salmeterol Inhale 1 puff into the lungs in the morning and at bedtime.            Discharge Care Instructions  (From admission, onward)         Start     Ordered   01/15/21 0000  Change dressing on IV access line weekly and PRN  (Home infusion instructions - [facility] )        01/15/21 0755          Activity: activity as tolerated Diet: regular diet Wound Care: as directed  Follow-up with [facility] in 3 weeks.  Signed: Dagoberto Ligas, PA-C 01/19/2021 6:46 AM [facility] Office: [phone]

## 2021-01-29 ENCOUNTER — Encounter: Payer: Self-pay | Admitting: Vascular Surgery

## 2021-01-29 ENCOUNTER — Ambulatory Visit (INDEPENDENT_AMBULATORY_CARE_PROVIDER_SITE_OTHER): Payer: No Typology Code available for payment source | Admitting: Vascular Surgery

## 2021-01-29 ENCOUNTER — Other Ambulatory Visit: Payer: Self-pay

## 2021-01-29 VITALS — BP 154/89 | HR 63 | Temp 98.2°F | Resp 20 | Ht 66.0 in | Wt 198.0 lb

## 2021-01-29 DIAGNOSIS — I739 Peripheral vascular disease, unspecified: Secondary | ICD-10-CM

## 2021-01-29 DIAGNOSIS — T827XXA Infection and inflammatory reaction due to other cardiac and vascular devices, implants and grafts, initial encounter: Secondary | ICD-10-CM

## 2021-01-29 NOTE — Progress Notes (Signed)
     Subjective:     Patient ID: Sean [name], male   DOB: February 23, 1951, 70 y.o.   MRN: [id number]  HPI 70 year old male status post right femoral sartorius flap to cover femoral endart with pericardial patch angioplasty and right to left femorofemoral bypass.  He does have swelling of his right leg but otherwise legs are feeling much better.  He states that his groin wound is healing well.  He is currently on IV antibiotics.  He unfortunately did not bring a wound VAC with him today and this was changed yesterday by his home health nurse.   Review of Systems Right lower extremity swelling    Objective:   Physical Exam Vitals:   01/29/21 1453  BP: (!) 154/89  Pulse: 63  Resp: 20  Temp: 98.2 F (36.8 C)  SpO2: 92%   Awake alert oriented Palpable pulsatility in the graft Right groin wound VAC in place Right upper extremity PICC line in place    Assessment:     70 year old male status post right femoral sartorius flap for underlying graft infection.  No longer having fevers states that the wound is healing well.    Plan:     Given that patient does not have a wound VAC with him today we will plan to let this continue to be changed by home health nurse and I will see him back in 1 to 2 weeks in the office or we can change the wound VAC and evaluate the wound.  He will continue IV antibiotics.  Dia Donate C. Donzetta Matters, MD [facility] Office: [phone] Pager: [phone]

## 2021-02-01 ENCOUNTER — Telehealth (HOSPITAL_COMMUNITY): Payer: Self-pay

## 2021-02-01 ENCOUNTER — Encounter: Payer: Self-pay | Admitting: Infectious Diseases

## 2021-02-01 NOTE — Telephone Encounter (Signed)
[place] from the [facility] called and left a message wanting to know if pt has been schedule for pulmonary rehab. I called back and left a message stating that the pt has about 8 ppl ahead of him before we can schedule and that once we schedule him we will give them a call.

## 2021-02-02 ENCOUNTER — Ambulatory Visit (INDEPENDENT_AMBULATORY_CARE_PROVIDER_SITE_OTHER): Payer: No Typology Code available for payment source | Admitting: Infectious Diseases

## 2021-02-02 ENCOUNTER — Other Ambulatory Visit: Payer: Self-pay

## 2021-02-02 VITALS — BP 129/74 | HR 67 | Wt 200.0 lb

## 2021-02-02 DIAGNOSIS — T827XXA Infection and inflammatory reaction due to other cardiac and vascular devices, implants and grafts, initial encounter: Secondary | ICD-10-CM | POA: Diagnosis not present

## 2021-02-02 DIAGNOSIS — Z5181 Encounter for therapeutic drug level monitoring: Secondary | ICD-10-CM | POA: Diagnosis not present

## 2021-02-02 NOTE — Progress Notes (Signed)
[facility]                                                             [place], [place], [place], [address]                                                                  Phn. 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 OR cultures growing Strep agalactiae S/p Pulse evacuation of rt groin and rt groin sartorius muscle flap and application of negative pressure rt groin wound 01/12/21  Medication monitoring 6/13 Cr 0.77, ESR 36, CRP 7  Plan Continue IV penicillin as is.  Plan to treat for 6 weeks from 5/31.  tentative end date is July 12, 22 We will plan to follow-up in 3 weeks for possible transition to p.o. antibiotics at that time Follow-up with vascular surgery as instructed  All questions and concerns were discussed and addressed. Patient verbalized understanding of the plan. ____________________________________________________________________________________________________________________  HPI/interval events Here for follow-up of vascular graft infection.  He is getting IV penicillin through penicillin pump.  Denies any fevers, chills, sweats.  Denies nausea, vomiting, abdominal pain, diarrhea.  Denies any pain swelling or  tenderness in the PICC line.  He he was also seen by vascular surgery on 6/17 and was thought to be doing well.  He has a wound VAC in place and denies some discomfort/tenderness in that area which is stable.  Denies any severe pain swelling or severe tenderness in that area. I discussed with him to continue the IV penicillin for 3 more weeks and reevaluate at that time to switch to p.o. antibiotics.  He has no complaints today and is doing well.   ROS: Negative for fever, chills, activity change, appetite change, fatigue and unexpected weight change.  Negative for chest pain/SOB and coug  Past Medical History:  Diagnosis Date   Arthritis    COPD (chronic obstructive pulmonary disease) (HCC)    GERD (gastroesophageal reflux disease)  Hyperlipidemia    Hypertension    Sleep apnea    Past Surgical History:  Procedure Laterality Date   ABDOMINAL AORTOGRAM W/LOWER EXTREMITY Bilateral 09/28/2020   Procedure: ABDOMINAL AORTOGRAM W/LOWER EXTREMITY;  Surgeon: Waynetta Sandy, MD;  Location: [facility];  Service: Cardiovascular;  Laterality: Bilateral;   APPLICATION OF WOUND VAC Right 01/12/2021   Procedure: APPLICATION OF WOUND VAC;  Surgeon: Waynetta Sandy, MD;  Location: [facility];  Service: Vascular;  Laterality: Right;   BACK SURGERY     ENDARTERECTOMY FEMORAL Left 11/04/2020   Procedure: LEFT COMMON FEMORAL ARTERY ENDARTERECTOMY;  Surgeon: Waynetta Sandy, MD;  Location: [facility];  Service: Vascular;  Laterality: Left;   EYE SURGERY     FEMORAL-FEMORAL BYPASS GRAFT Bilateral 11/04/2020   Procedure: RIGHT TO LEFT FEMORAL-FEMORAL BYPASS;  Surgeon: Waynetta Sandy, MD;  Location: [facility];  Service: Vascular;  Laterality: Bilateral;   HERNIA REPAIR     INCISION AND DRAINAGE OF WOUND Right 01/06/2021   Procedure: IRRIGATION AND DEBRIDEMENT GROIN;  Surgeon: Serafina Mitchell, MD;  Location: [facility];  Service: Vascular;  Laterality: Right;   INCISION AND DRAINAGE  OF WOUND Right 01/12/2021   Procedure: [facility] OUT AND MUSCLE FLAP;  Surgeon: Waynetta Sandy, MD;  Location: [place];  Service: Vascular;  Laterality: Right;   PATCH ANGIOPLASTY Bilateral 11/04/2020   Procedure: PATCH ANGIOPLASTY LEFT AND RIGHT COMMON FEMORAL ARTERY;  Surgeon: Waynetta Sandy, MD;  Location: [facility];  Service: Vascular;  Laterality: Bilateral;   ULTRASOUND GUIDANCE FOR VASCULAR ACCESS Bilateral 11/04/2020   Procedure: ULTRASOUND GUIDANCE FOR VASCULAR ACCESS;  Surgeon: Waynetta Sandy, MD;  Location: [facility];  Service: Vascular;  Laterality: Bilateral;   WISDOM TOOTH EXTRACTION       Allergies  Allergen Reactions   Amlodipine Swelling and Other (See Comments)    Edema of lower extremities    Social History   Socioeconomic History   Marital status: Single    Spouse name: Not on file   Number of children: Not on file   Years of education: Not on file   Highest education level: Not on file  Occupational History   Not on file  Tobacco Use   Smoking status: Former    Pack years: 0.00    Types: Cigarettes    Quit date: 06/25/2013    Years since quitting: 7.6   Smokeless tobacco: Former    Quit date: 06/25/1976  Vaping Use   Vaping Use: Never used  Substance and Sexual Activity   Alcohol use: Yes    Alcohol/week: 30.0 standard drinks    Types: 30 Cans of beer per week   Drug use: No   Sexual activity: Not on file  Other Topics Concern   Not on file  Social History Narrative   Not on file   Social Determinants of Health   Financial Resource Strain: Not on file  Food Insecurity: Not on file  Transportation Needs: Not on file  Physical Activity: Not on file  Stress: Not on file  Social Connections: Not on file  Intimate Partner Violence: Not on file     Vitals Wt 200 lb (90.7 kg)   BMI 32.28 kg/m    Examination  General - not in acute distress, comfortably sitting in chair, obese HEENT - PEERLA, no pallor and no  icterus Chest - b/l clear air entry, no additional sounds CVS- Normal s1s2, RRR Abdomen - Soft, Non tender , non distended  Ext- no pedal edema Rt Groin      Neuro: grossly normal Back - WNL Psych : calm and cooperative   Recent labs CBC Latest Ref Rng & Units 01/09/2021 01/08/2021 01/07/2021  WBC 4.0 - 10.5 K/uL 10.3 12.2(H) 8.1  Hemoglobin 13.0 - 17.0 g/dL 10.7(L) 10.3(L) 10.8(L)  Hematocrit 39.0 - 52.0 % 32.5(L) 31.3(L) 33.4(L)  Platelets 150 - 400 K/uL 373 372 394   CMP Latest Ref Rng & Units 01/08/2021 01/07/2021 01/04/2021  Glucose 70 - 99 mg/dL 118(H) 113(H) 87  BUN 8 - 23 mg/dL '16 13 12  ' Creatinine 0.61 - 1.24 mg/dL 0.92 0.89 0.82  Sodium 135 - 145 mmol/L 131(L) 128(L) 129(L)  Potassium 3.5 - 5.1 mmol/L 4.5 5.4(H) 4.7  Chloride 98 - 111 mmol/L 98 93(L) 94(L)  CO2 22 - 32 mmol/L '26 26 23  ' Calcium 8.9 - 10.3 mg/dL 9.2 9.1 9.1  Total Protein 6.5 - 8.1 g/dL - - 7.5  Total Bilirubin 0.3 - 1.2 mg/dL - - 0.8  Alkaline Phos 38 - 126 U/L - - 76  AST 15 - 41 U/L - - 29  ALT 0 - 44 U/L - - 29     Pertinent Microbiology Results for orders placed or performed during the hospital encounter of 01/04/21  SARS Coronavirus 2 by RT PCR (hospital order, performed in [facility] hospital lab) Nasopharyngeal Nasopharyngeal Swab     Status: None   Collection Time: 01/06/21 10:56 AM   Specimen: Nasopharyngeal Swab  Result Value Ref Range Status   SARS Coronavirus 2 NEGATIVE NEGATIVE Final    Comment: Performed at [facility], [address] [place], [place] [address]  Aerobic/Anaerobic Culture w Gram Stain (surgical/deep wound)     Status: None   Collection Time: 01/06/21  3:03 PM   Specimen: PATH Other; Tissue  Result Value Ref Range Status   Specimen Description WOUND RIGHT GROIN  Final   Special Requests PT ON ANCEF  Final   Gram Stain   Final    MODERATE WBC PRESENT,BOTH PMN AND MONONUCLEAR RARE GRAM POSITIVE COCCI    Culture   Final    FEW STREPTOCOCCUS  AGALACTIAE TESTING AGAINST S. AGALACTIAE NOT ROUTINELY PERFORMED DUE TO PREDICTABILITY OF AMP/PEN/VAN SUSCEPTIBILITY. NO ANAEROBES ISOLATED Performed at [facility], [place] [address], [place], [place] [address]    Report Status 01/11/2021 FINAL  Final  Acid Fast Smear (AFB)     Status: None   Collection Time: 01/06/21  3:03 PM   Specimen: PATH Other; Tissue  Result Value Ref Range Status   AFB Specimen Processing Concentration  Final   Acid Fast Smear Negative  Final    Comment: (NOTE) Performed At: [facility] [place], [place] [id number] Rush Farmer MD [id number]    Source (AFB) WOUND  Final    Comment: RIGHT GROIN Performed at [facility], [place] [address], [place], [place] [address]      Pertinent Imaging All pertinent labs/Imagings/notes reviewed. All pertinent plain films and CT images have been personally visualized and interpreted; radiology reports have been reviewed. Decision making incorporated into the Impression / Recommendations.  I have spent 30 minutes for this patient encounter including  review of prior medical records with greater than 50% of time in face to face counsel of the patient/discussing diagnostics and plan of care.   Electronically signed by:  [name], MD Infectious Disease Physician [facility] [address] [place], [place] [address] Phone: [phone]  Fax: [phone]

## 2021-02-04 NOTE — Telephone Encounter (Signed)
Called and spoke with pt in regards to PR, pt stated he just had surgery and doesn't think he would like to participate at this time.   Closed referral

## 2021-02-05 ENCOUNTER — Encounter: Payer: Self-pay | Admitting: Vascular Surgery

## 2021-02-05 ENCOUNTER — Ambulatory Visit (INDEPENDENT_AMBULATORY_CARE_PROVIDER_SITE_OTHER): Payer: Self-pay | Admitting: Vascular Surgery

## 2021-02-05 ENCOUNTER — Other Ambulatory Visit: Payer: Self-pay

## 2021-02-05 VITALS — BP 165/89 | HR 61 | Temp 98.2°F | Resp 20 | Ht 66.0 in | Wt 200.0 lb

## 2021-02-05 DIAGNOSIS — T8189XD Other complications of procedures, not elsewhere classified, subsequent encounter: Secondary | ICD-10-CM

## 2021-02-05 DIAGNOSIS — I739 Peripheral vascular disease, unspecified: Secondary | ICD-10-CM

## 2021-02-05 NOTE — Progress Notes (Signed)
     Subjective:     Patient ID: Sean [name], male   DOB: 05-29-1951, 70 y.o.   MRN: [id number]  HPI 70 year old male status post sartorius flap coverage of femorofemoral bypass and right common femoral bovine pericardial patch angioplasty.  This was from bilateral femoral endarterectomy with bilateral pericardial patch angioplasties and right to left femorofemoral bypass.  He continues on IV antibiotics.  He is here today for wound VAC change.  He is having some persistent right leg swelling and itching but no other complaints today   Review of Systems Right leg swelling, right groin wound    Objective:   Physical Exam Vitals:   02/05/21 1101  BP: (!) 165/89  Pulse: 61  Resp: 20  Temp: 98.2 F (36.8 C)  SpO2: 92%  Awake alert oriented Not lab respirations Abdomen is soft Right groin wound is superficial almost completely sealed over with no place for placement of wound VAC There is a palpable pulse in the femorofemoral bypass     Assessment:     70 year-old status post right femoral sartorius flap for underlying graft infection.  Continues on antibiotics.  Wound VAC removed today but was not replaced given superficial nature of the wound which is nearly healed    Plan:     Continue antibiotics as prescribed  Follow-up in 3 months with femorofemoral bypass duplex and ABIs unless there are issues and we will certainly see him sooner    Krista Som C. Donzetta Matters, MD [facility] Office: [phone] Pager: [phone]

## 2021-02-08 ENCOUNTER — Telehealth: Payer: Self-pay

## 2021-02-08 NOTE — Telephone Encounter (Signed)
Home Health calls today to ask about wound care orders for Sean [name] - wound is very superficial and nearly healed per note on 6/24. Advised to wash wound with mild soap and water - pat dry and cover with a dry dressing.

## 2021-02-09 ENCOUNTER — Other Ambulatory Visit: Payer: Self-pay

## 2021-02-09 DIAGNOSIS — I739 Peripheral vascular disease, unspecified: Secondary | ICD-10-CM

## 2021-02-09 NOTE — Telephone Encounter (Signed)
Patient calls today to report some blistering about 2 inches below the surgical site. Discussed with Deanna (RN who saw him yesterday) says the wound is healing very well and almost closed. Blisters not present yesterday, probably a reaction to tape. Advised patient to keep an eye on the area - keep it clean and dry and do not apply tape over the blisters. Patient verbalizes understanding and knows to call back if further issues.

## 2021-02-19 ENCOUNTER — Encounter: Payer: Self-pay | Admitting: Infectious Diseases

## 2021-02-19 ENCOUNTER — Other Ambulatory Visit: Payer: Self-pay

## 2021-02-19 ENCOUNTER — Ambulatory Visit (INDEPENDENT_AMBULATORY_CARE_PROVIDER_SITE_OTHER): Payer: Medicare PPO | Admitting: Infectious Diseases

## 2021-02-19 VITALS — BP 155/82 | HR 61 | Temp 98.1°F | Wt 199.0 lb

## 2021-02-19 DIAGNOSIS — T827XXD Infection and inflammatory reaction due to other cardiac and vascular devices, implants and grafts, subsequent encounter: Secondary | ICD-10-CM | POA: Diagnosis not present

## 2021-02-19 DIAGNOSIS — Z5181 Encounter for therapeutic drug level monitoring: Secondary | ICD-10-CM

## 2021-02-19 LAB — ACID FAST CULTURE WITH REFLEXED SENSITIVITIES (MYCOBACTERIA): Acid Fast Culture: NEGATIVE

## 2021-02-19 MED ORDER — AMOXICILLIN 500 MG PO CAPS: 1000.0000 mg | ORAL_CAPSULE | Freq: Three times a day (TID) | ORAL | 1 refills | Status: DC

## 2021-02-19 NOTE — Progress Notes (Addendum)
[name]                                                             [place], [place], [place], [address]                                                                  Phn. 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 OR cultures growing Strep agalactiae S/p Pulse evacuation of rt groin and rt groin sartorius muscle flap and application of negative pressure rt groin wound 01/12/21  Medication monitoring 02/01/21 hb 10.3, platelets 248, Cr 0.80   Plan Continue IV Penicillin until July 12, 22 and then PO Amoxicillin 1g PO TID, plan to treat for at least 6 months  ESR and CRP today  Fu in 2 weeks for lab visit Fu in 1 month with me   All questions and concerns were discussed and addressed. Patient verbalized understanding of the plan. ____________________________________________________________________________________________________________________  HPI/interval events Here for follow-up of vascular graft infection.  He is getting IV penicillin through penicillin pump.  Denies any fevers, chills,  sweats.  Denies nausea, vomiting, abdominal pain, diarrhea.  Denies any pain swelling or tenderness in the PICC line.  He he was also seen by vascular surgery on 6/17 and was thought to be doing well.  He has a wound VAC in place and denies some discomfort/tenderness in that area which is stable.  Denies any severe pain swelling or severe tenderness in that area. I discussed with him to continue the IV penicillin for 3 more weeks and reevaluate at that time to switch to p.o. antibiotics.  He has no complaints today and is doing well.   02/19/21 Getting IV penicillin through penicillin pump. Denies any side effects like nausea, vomiting, diarrhea or rashes. Denies any issues with the PICC line. Rt groin surgical site  is healing well with no redness/swelling and drainage. He has been following up with Vascular, plan for follow up in 3 months with femorofemoral bypass duplex and ABIs. Discussed with him stop date of IV antibiotics, PICC line removal and plan to start amoxicillin thereafter. No acute complaints today   ROS: Negative for fever, chills, activity change, appetite change, fatigue and unexpected weight change.  Negative for chest pain/SOB and cough  Past Medical History:  Diagnosis Date   Arthritis    COPD (chronic obstructive pulmonary disease) ([place])    GERD (gastroesophageal reflux disease)    Hyperlipidemia    Hypertension    Sleep apnea    Past Surgical History:  Procedure Laterality Date   ABDOMINAL AORTOGRAM W/LOWER EXTREMITY Bilateral 09/28/2020   Procedure: ABDOMINAL AORTOGRAM W/LOWER EXTREMITY;  Surgeon: Waynetta Sandy, MD;  Location: [facility];  Service: Cardiovascular;  Laterality: Bilateral;   APPLICATION OF WOUND VAC Right 01/12/2021   Procedure: APPLICATION OF WOUND VAC;  Surgeon: Waynetta Sandy, MD;  Location: [facility];  Service: Vascular;  Laterality: Right;   BACK SURGERY     ENDARTERECTOMY FEMORAL Left 11/04/2020   Procedure: LEFT COMMON  FEMORAL ARTERY ENDARTERECTOMY;  Surgeon: Waynetta Sandy, MD;  Location: [facility];  Service: Vascular;  Laterality: Left;   EYE SURGERY     FEMORAL-FEMORAL BYPASS GRAFT Bilateral 11/04/2020   Procedure: RIGHT TO LEFT FEMORAL-FEMORAL BYPASS;  Surgeon: Waynetta Sandy, MD;  Location: [facility] [place];  Service: Vascular;  Laterality: Bilateral;   HERNIA REPAIR     INCISION AND DRAINAGE OF WOUND Right 01/06/2021   Procedure: IRRIGATION AND DEBRIDEMENT GROIN;  Surgeon: Serafina Mitchell, MD;  Location: [facility];  Service: Vascular;  Laterality: Right;   INCISION AND DRAINAGE OF WOUND Right 01/12/2021   Procedure: [facility] OUT AND MUSCLE FLAP;  Surgeon: Waynetta Sandy, MD;  Location: [facility];  Service: Vascular;  Laterality: Right;   PATCH ANGIOPLASTY Bilateral 11/04/2020   Procedure: PATCH ANGIOPLASTY LEFT AND RIGHT COMMON FEMORAL ARTERY;  Surgeon: Waynetta Sandy, MD;  Location: [facility];  Service: Vascular;  Laterality: Bilateral;   ULTRASOUND GUIDANCE FOR VASCULAR ACCESS Bilateral 11/04/2020   Procedure: ULTRASOUND GUIDANCE FOR VASCULAR ACCESS;  Surgeon: Waynetta Sandy, MD;  Location: [facility];  Service: Vascular;  Laterality: Bilateral;   WISDOM TOOTH EXTRACTION       Allergies  Allergen Reactions   Amlodipine Swelling and Other (See Comments)    Edema of lower extremities    Social History   Socioeconomic History   Marital status: Single    Spouse name: Not on file   Number of children: Not on file   Years of education: Not on file   Highest education level: Not on file  Occupational History   Not on file  Tobacco Use   Smoking status: Former    Pack years: 0.00    Types: Cigarettes    Quit date: 06/25/2013    Years since quitting: 7.6   Smokeless tobacco: Former    Quit date: 06/25/1976  Vaping Use   Vaping Use: Never used  Substance and Sexual Activity   Alcohol use: Yes    Alcohol/week: 30.0 standard drinks    Types: 30 Cans of beer per week    Drug use: No   Sexual activity: Not on file  Other Topics Concern   Not on file  Social History Narrative   Not on file   Social Determinants of Health   Financial  Resource Strain: Not on file  Food Insecurity: Not on file  Transportation Needs: Not on file  Physical Activity: Not on file  Stress: Not on file  Social Connections: Not on file  Intimate Partner Violence: Not on file     Vitals BP (!) 155/82   Pulse 61   Temp 98.1 F (36.7 C) (Oral)   Wt 199 lb (90.3 kg)   SpO2 96%   BMI 32.12 kg/m    Examination  General - not in acute distress, comfortably sitting in chair, obese HEENT - PEERLA, no pallor and no icterus Chest - b/l clear air entry, no additional sounds CVS- Normal s1s2, RRR Abdomen - Soft, Non tender , non distended Ext- no pedal edema Rt Groin     Neuro: grossly normal Back - WNL Psych : calm and cooperative   Recent labs CBC Latest Ref Rng & Units 01/09/2021 01/08/2021 01/07/2021  WBC 4.0 - 10.5 K/uL 10.3 12.2(H) 8.1  Hemoglobin 13.0 - 17.0 g/dL 10.7(L) 10.3(L) 10.8(L)  Hematocrit 39.0 - 52.0 % 32.5(L) 31.3(L) 33.4(L)  Platelets 150 - 400 K/uL 373 372 394   CMP Latest Ref Rng & Units 01/08/2021 01/07/2021 01/04/2021  Glucose 70 - 99 mg/dL 118(H) 113(H) 87  BUN 8 - 23 mg/dL '16 13 12  ' Creatinine 0.61 - 1.24 mg/dL 0.92 0.89 0.82  Sodium 135 - 145 mmol/L 131(L) 128(L) 129(L)  Potassium 3.5 - 5.1 mmol/L 4.5 5.4(H) 4.7  Chloride 98 - 111 mmol/L 98 93(L) 94(L)  CO2 22 - 32 mmol/L '26 26 23  ' Calcium 8.9 - 10.3 mg/dL 9.2 9.1 9.1  Total Protein 6.5 - 8.1 g/dL - - 7.5  Total Bilirubin 0.3 - 1.2 mg/dL - - 0.8  Alkaline Phos 38 - 126 U/L - - 76  AST 15 - 41 U/L - - 29  ALT 0 - 44 U/L - - 29     Pertinent Microbiology Results for orders placed or performed during the hospital encounter of 01/04/21  SARS Coronavirus 2 by RT PCR (hospital order, performed in [facility] hospital lab) Nasopharyngeal Nasopharyngeal Swab     Status: None   Collection  Time: 01/06/21 10:56 AM   Specimen: Nasopharyngeal Swab  Result Value Ref Range Status   SARS Coronavirus 2 NEGATIVE NEGATIVE Final    Comment: Performed at [facility], [address] [place] [address], [place], [place] [address]  Aerobic/Anaerobic Culture w Gram Stain (surgical/deep wound)     Status: None   Collection Time: 01/06/21  3:03 PM   Specimen: PATH Other; Tissue  Result Value Ref Range Status   Specimen Description WOUND RIGHT GROIN  Final   Special Requests PT ON ANCEF  Final   Gram Stain   Final    MODERATE WBC PRESENT,BOTH PMN AND MONONUCLEAR RARE GRAM POSITIVE COCCI    Culture   Final    FEW STREPTOCOCCUS AGALACTIAE TESTING AGAINST S. AGALACTIAE NOT ROUTINELY PERFORMED DUE TO PREDICTABILITY OF AMP/PEN/VAN SUSCEPTIBILITY. NO ANAEROBES ISOLATED Performed at [facility], [place] [address] [place], [place] [address]    Report Status 01/11/2021 FINAL  Final  Acid Fast Culture with reflexed sensitivities     Status: None   Collection Time: 01/06/21  3:03 PM   Specimen: PATH Other; Tissue  Result Value Ref Range Status   Acid Fast Culture Negative  Final    Comment: (NOTE) No acid fast bacilli isolated after 6 weeks. Performed At: [facility] [place], [place] [id number] Rush Farmer MD  [id number]    Source of Sample WOUND  Final    Comment: RIGHT GROIN Performed at [facility], [place] [address] [place], [place] [address]   Acid Fast Smear (AFB)     Status: None   Collection Time: 01/06/21  3:03 PM   Specimen: PATH Other; Tissue  Result Value Ref Range Status   AFB Specimen Processing Concentration  Final   Acid Fast Smear Negative  Final    Comment: (NOTE) Performed At: [name] [place], [place] [id number] Rush Farmer MD [id number]    Source (AFB) WOUND  Final    Comment: RIGHT GROIN Performed at [facility], [place] [address] [place], [place] [address]      Pertinent Imaging All  pertinent labs/Imagings/notes reviewed. All pertinent plain films and CT images have been personally visualized and interpreted; radiology reports have been reviewed. Decision making incorporated into the Impression / Recommendations.  I have spent 35  minutes for this patient encounter including  review of prior medical records with greater than 50% of time in face to face counsel of the patient/discussing diagnostics and plan of care.   Electronically signed by:  [name], MD Infectious Disease Physician [facility] for Infect[facility] Disease [address] [place], [place] [address] Phone: [phone]  Fax: [phone]

## 2021-02-20 DIAGNOSIS — T827XXA Infection and inflammatory reaction due to other cardiac and vascular devices, implants and grafts, initial encounter: Secondary | ICD-10-CM | POA: Insufficient documentation

## 2021-02-20 LAB — C-REACTIVE PROTEIN: CRP: 5.2 mg/L (ref <8.0)

## 2021-02-20 LAB — SEDIMENTATION RATE: Sed Rate: 17 mm/h (ref 0–20)

## 2021-02-22 ENCOUNTER — Telehealth: Payer: Self-pay

## 2021-02-22 NOTE — Telephone Encounter (Signed)
Sean [name] called from [facility] wanting orders for PULL PICC. I gave verbal orders and reminded that patient will begin oral abx after pull picc. Orders to be sent to [place] for doctor to sign.

## 2021-04-05 ENCOUNTER — Other Ambulatory Visit: Payer: Self-pay

## 2021-04-05 ENCOUNTER — Ambulatory Visit (INDEPENDENT_AMBULATORY_CARE_PROVIDER_SITE_OTHER): Payer: No Typology Code available for payment source | Admitting: Infectious Diseases

## 2021-04-05 VITALS — BP 161/100 | HR 89 | Resp 16 | Ht 66.0 in | Wt 208.0 lb

## 2021-04-05 DIAGNOSIS — T827XXD Infection and inflammatory reaction due to other cardiac and vascular devices, implants and grafts, subsequent encounter: Secondary | ICD-10-CM | POA: Diagnosis not present

## 2021-04-05 DIAGNOSIS — Z5181 Encounter for therapeutic drug level monitoring: Secondary | ICD-10-CM

## 2021-04-05 LAB — BASIC METABOLIC PANEL
BUN: 15 mg/dL (ref 7–25)
CO2: 28 mmol/L (ref 20–32)
Calcium: 9.6 mg/dL (ref 8.6–10.3)
Chloride: 100 mmol/L (ref 98–110)
Creat: 0.87 mg/dL (ref 0.70–1.35)
Glucose, Bld: 97 mg/dL (ref 65–99)
Potassium: 4.7 mmol/L (ref 3.5–5.3)
Sodium: 136 mmol/L (ref 135–146)

## 2021-04-05 MED ORDER — AMOXICILLIN 500 MG PO CAPS: 1000.0000 mg | ORAL_CAPSULE | Freq: Two times a day (BID) | ORAL | 1 refills | Status: DC

## 2021-04-05 MED ORDER — AMOXICILLIN 500 MG PO CAPS: 1000.0000 mg | ORAL_CAPSULE | Freq: Three times a day (TID) | ORAL | 1 refills | Status: DC

## 2021-04-05 NOTE — Progress Notes (Signed)
[facility]                                                             [place], [place], [place], [address]                                                                  Phn. [phone]; Fax: [phone]                                                                             Date: 04/05/2021  Reason for Referral: Hospital follow-up for vascular graft infection   Assessment 70 Y O Male with h/o PAD s/p  bilateral common femoral endarterectomies with bovine pericardial patch angioplasty and rt to left femoral to femoral bypass with dacron on 11/04/20 with  Problem List Items Addressed This Visit       Other   Medication monitoring encounter - Primary   Relevant Orders   Basic metabolic panel   Infection and inflammatory reaction due to vascular device, implant, and graft [facility])     # Vascular Graft Infection:  S/p I and D of rt groin and placement of antibiotic impregnated beads on 5/25. OR cultures growing Strep agalactiae S/p Pulse evacuation of rt groin and rt groin sartorius muscle flap and application of negative pressure rt groin wound 01/12/21  # Medication monitoring   Plan Will decrease dosing of amoxicillin 1 g p.o. 3 times daily to 1 g p.o. twice daily. Plan to treat for at least 6 months through January 2022.  2. BMP today and if looks normal we will follow-up in 6 weeks 3. I discussed with patient regarding referral to urology for possible phimosis of his penis.  He said he will ask his PCP for a referral to urology to the [facility] system 4. Follow-up with vascular surgery as instructed 5. Follow-up with me in 6 weeks  All questions and concerns were discussed and addressed. Patient verbalized understanding of the plan. ____________________________________________________________________________________________________________________  HPI/interval events  04/05/21 Here for follow-up of vascular graft infection.  He has completed his 6 weeks of IV penicillin on February 23, 2021 and was started on amoxicillin 1 g p.o. 3 times daily.  PICC line has been removed.  The wound in his right groin has completely healed with no issues.  The tenderness present in his right groin initially is also improving.  He complains of fullness in his lower abdominal wall and also feels like his penis has shrunk in size.  Denies any pain, tenderness or fever and chills.  Denies any difficulty with urination.  There is no any edema/erythema/tenderness or fluctuance noted on exam of the lower abdominal wall as well as his penis and scrotum although his penis seems to  be shorter in size.  No issues with antibiotics except few episodes of loose stools, 3-4 times a day with no abdominal cramps.  Discussed with him to decrease the dosing of amoxicillin from 1 g 3 times daily to 1 g twice daily.  He says he has a follow-up with vascular surgery in November 2022.  Discussed with him regarding referral to urology for his penile concern( ? Phimosis).  He says he would ask his PCP to follow-up with referral to urology to the [facility].   ROS: Negative for fever, chills, activity change, appetite change, fatigue and unexpected weight change.  Negative for chest pain/SOB and cough  Past Medical History:  Diagnosis Date   Arthritis    COPD (chronic obstructive pulmonary disease) ([place])    GERD (gastroesophageal reflux disease)    Hyperlipidemia    Hypertension    Sleep apnea    Past Surgical History:  Procedure Laterality Date   ABDOMINAL AORTOGRAM W/LOWER EXTREMITY Bilateral 09/28/2020   Procedure: ABDOMINAL AORTOGRAM W/LOWER EXTREMITY;  Surgeon: Waynetta Sandy, MD;  Location: [facility];  Service: Cardiovascular;  Laterality: Bilateral;   APPLICATION OF WOUND VAC Right 01/12/2021   Procedure: APPLICATION OF WOUND VAC;  Surgeon: Waynetta Sandy, MD;  Location:  [facility];  Service: Vascular;  Laterality: Right;   BACK SURGERY     ENDARTERECTOMY FEMORAL Left 11/04/2020   Procedure: LEFT COMMON FEMORAL ARTERY ENDARTERECTOMY;  Surgeon: Waynetta Sandy, MD;  Location: [facility];  Service: Vascular;  Laterality: Left;   EYE SURGERY     FEMORAL-FEMORAL BYPASS GRAFT Bilateral 11/04/2020   Procedure: RIGHT TO LEFT FEMORAL-FEMORAL BYPASS;  Surgeon: Waynetta Sandy, MD;  Location: [facility];  Service: Vascular;  Laterality: Bilateral;   HERNIA REPAIR     INCISION AND DRAINAGE OF WOUND Right 01/06/2021   Procedure: IRRIGATION AND DEBRIDEMENT GROIN;  Surgeon: Serafina Mitchell, MD;  Location: [facility];  Service: Vascular;  Laterality: Right;   INCISION AND DRAINAGE OF WOUND Right 01/12/2021   Procedure: [facility] OUT AND MUSCLE FLAP;  Surgeon: Waynetta Sandy, MD;  Location: [facility];  Service: Vascular;  Laterality: Right;   PATCH ANGIOPLASTY Bilateral 11/04/2020   Procedure: PATCH ANGIOPLASTY LEFT AND RIGHT COMMON FEMORAL ARTERY;  Surgeon: Waynetta Sandy, MD;  Location: [facility];  Service: Vascular;  Laterality: Bilateral;   ULTRASOUND GUIDANCE FOR VASCULAR ACCESS Bilateral 11/04/2020   Procedure: ULTRASOUND GUIDANCE FOR VASCULAR ACCESS;  Surgeon: Waynetta Sandy, MD;  Location: [facility];  Service: Vascular;  Laterality: Bilateral;   WISDOM TOOTH EXTRACTION       Allergies  Allergen Reactions   Amlodipine Swelling and Other (See Comments)    Edema of lower extremities    Social History   Socioeconomic History   Marital status: Single    Spouse name: Not on file   Number of children: Not on file   Years of education: Not on file   Highest education level: Not on file  Occupational History   Not on file  Tobacco Use   Smoking status: Former    Types: Cigarettes    Quit date: 06/25/2013    Years since quitting: 7.7   Smokeless tobacco: Former    Quit date: 06/25/1976  Vaping Use   Vaping Use: Never used  Substance and  Sexual Activity   Alcohol use: Yes    Alcohol/week: 30.0 standard drinks    Types: 30 Cans of beer per week   Drug use: No  Sexual activity: Not on file  Other Topics Concern   Not on file  Social History Narrative   Not on file   Social Determinants of Health   Financial Resource Strain: Not on file  Food Insecurity: Not on file  Transportation Needs: Not on file  Physical Activity: Not on file  Stress: Not on file  Social Connections: Not on file  Intimate Partner Violence: Not on file     Vitals BP (!) 161/100   Pulse 89   Resp 16   Ht '5\' 6"'$  (1.676 m)   Wt 208 lb (94.3 kg)   SpO2 95%   BMI 33.57 kg/m     Examination  General - not in acute distress, comfortably sitting in chair, obese HEENT - PEERLA, no pallor and no icterus Chest - b/l clear air entry, no additional sounds CVS- Normal s1s2, RRR Abdomen - Soft, Non tender , non distended Ext- no pedal edema Rt Groin -surgical site has completely healed GU- ? phimosis Neuro: grossly normal Back - WNL Psych : calm and cooperative   Recent labs CBC Latest Ref Rng & Units 01/09/2021 01/08/2021 01/07/2021  WBC 4.0 - 10.5 K/uL 10.3 12.2(H) 8.1  Hemoglobin 13.0 - 17.0 g/dL 10.7(L) 10.3(L) 10.8(L)  Hematocrit 39.0 - 52.0 % 32.5(L) 31.3(L) 33.4(L)  Platelets 150 - 400 K/uL 373 372 394   CMP Latest Ref Rng & Units 01/08/2021 01/07/2021 01/04/2021  Glucose 70 - 99 mg/dL 118(H) 113(H) 87  BUN 8 - 23 mg/dL '16 13 12  '$ Creatinine 0.61 - 1.24 mg/dL 0.92 0.89 0.82  Sodium 135 - 145 mmol/L 131(L) 128(L) 129(L)  Potassium 3.5 - 5.1 mmol/L 4.5 5.4(H) 4.7  Chloride 98 - 111 mmol/L 98 93(L) 94(L)  CO2 22 - 32 mmol/L '26 26 23  '$ Calcium 8.9 - 10.3 mg/dL 9.2 9.1 9.1  Total Protein 6.5 - 8.1 g/dL - - 7.5  Total Bilirubin 0.3 - 1.2 mg/dL - - 0.8  Alkaline Phos 38 - 126 U/L - - 76  AST 15 - 41 U/L - - 29  ALT 0 - 44 U/L - - 29     Pertinent Microbiology Results for orders placed or performed during the hospital encounter of  01/04/21  SARS Coronavirus 2 by RT PCR (hospital order, performed in [facility] hospital lab) Nasopharyngeal Nasopharyngeal Swab     Status: None   Collection Time: 01/06/21 10:56 AM   Specimen: Nasopharyngeal Swab  Result Value Ref Range Status   SARS Coronavirus 2 NEGATIVE NEGATIVE Final    Comment: Performed at [facility], [address], [place], [place] [address]  Aerobic/Anaerobic Culture w Gram Stain (surgical/deep wound)     Status: None   Collection Time: 01/06/21  3:03 PM   Specimen: PATH Other; Tissue  Result Value Ref Range Status   Specimen Description WOUND RIGHT GROIN  Final   Special Requests PT ON ANCEF  Final   Gram Stain   Final    MODERATE WBC PRESENT,BOTH PMN AND MONONUCLEAR RARE GRAM POSITIVE COCCI    Culture   Final    FEW STREPTOCOCCUS AGALACTIAE TESTING AGAINST S. AGALACTIAE NOT ROUTINELY PERFORMED DUE TO PREDICTABILITY OF AMP/PEN/VAN SUSCEPTIBILITY. NO ANAEROBES ISOLATED Performed at [facility], [place] [address], [place], [place] [address]    Report Status 01/11/2021 FINAL  Final  Acid Fast Culture with reflexed sensitivities     Status: None   Collection Time: 01/06/21  3:03 PM   Specimen: PATH Other; Tissue  Result Value Ref Range Status   Acid Fast Culture Negative  Final    Comment: (NOTE) No acid fast bacilli isolated after 6 weeks. Performed At: [facility] [place], [place] [id number] Rush Farmer MD [id number]    Source of Sample WOUND  Final    Comment: RIGHT GROIN Performed at [facility], [place] [address] [place], [place] [address]   Acid Fast Smear (AFB)     Status: None   Collection Time: 01/06/21  3:03 PM   Specimen: PATH Other; Tissue  Result Value Ref Range Status   AFB Specimen Processing Concentration  Final   Acid Fast Smear Negative  Final    Comment: (NOTE) Performed At: [facility] [place], [place] [id number] Rush Farmer MD [id number]     Source (AFB) WOUND  Final    Comment: RIGHT GROIN Performed at [facility], [place] [address] [place], [place] [address]      Pertinent Imaging All pertinent labs/Imagings/notes reviewed. All pertinent plain films and CT images have been personally visualized and interpreted; radiology reports have been reviewed. Decision making incorporated into the Impression / Recommendations.  I have spent 35  minutes for this patient encounter including  review of prior medical records with greater than 50% of time in face to face counsel of the patient/discussing diagnostics and plan of care.   Electronically signed by:  [name], MD Infectious Disease Physician [facility] [address] [place], [place] [address] Phone: [phone]  Fax: [phone]

## 2021-04-09 ENCOUNTER — Telehealth: Payer: Self-pay

## 2021-04-09 NOTE — Telephone Encounter (Signed)
Received call today from Serena with [facility] in [place] regarding faxed medical records they received. Per [facility] patient is not a patient with them. Will need to update patient's chart with correct [facility] information.  Patient is not sure which [facility]. Provided number for [facility], Case Manager. Was able to update patient's chart with correct [facility]. [facility]: P: [place], [place]

## 2021-04-28 ENCOUNTER — Ambulatory Visit (INDEPENDENT_AMBULATORY_CARE_PROVIDER_SITE_OTHER): Payer: Medicare PPO | Admitting: Physician Assistant

## 2021-04-28 ENCOUNTER — Other Ambulatory Visit: Payer: Self-pay

## 2021-04-28 ENCOUNTER — Ambulatory Visit (HOSPITAL_COMMUNITY)
Admission: RE | Admit: 2021-04-28 | Discharge: 2021-04-28 | Disposition: A | Discharge status: Home / Self Care | Payer: Medicare PPO | Source: Ambulatory Visit | Attending: Vascular Surgery | Admitting: Vascular Surgery

## 2021-04-28 ENCOUNTER — Ambulatory Visit (INDEPENDENT_AMBULATORY_CARE_PROVIDER_SITE_OTHER)
Admission: RE | Admit: 2021-04-28 | Discharge: 2021-04-28 | Disposition: A | Discharge status: Home / Self Care | Payer: Medicare PPO | Source: Ambulatory Visit | Attending: Vascular Surgery | Admitting: Vascular Surgery

## 2021-04-28 VITALS — BP 171/93 | HR 85 | Temp 97.6°F | Resp 18 | Ht 67.0 in | Wt 207.0 lb

## 2021-04-28 DIAGNOSIS — Z95828 Presence of other vascular implants and grafts: Secondary | ICD-10-CM

## 2021-04-28 DIAGNOSIS — I739 Peripheral vascular disease, unspecified: Secondary | ICD-10-CM | POA: Diagnosis present

## 2021-04-28 DIAGNOSIS — T827XXA Infection and inflammatory reaction due to other cardiac and vascular devices, implants and grafts, initial encounter: Secondary | ICD-10-CM | POA: Diagnosis not present

## 2021-04-28 NOTE — Progress Notes (Signed)
[facility] [place] HISTORY AND PHYSICAL   History of Present Illness:  Patient is a 70 y.o. year old male who presents for evaluation of PAD.    status post sartorius flap coverage of femorofemoral bypass and right common femoral bovine pericardial patch angioplasty.  This was from bilateral femoral endarterectomy with bilateral pericardial patch angioplasties and right to left femorofemoral bypass.  He was on IV antibiotics and wound vac over the right groin.  He still has discomfort in the right groin.    He is now off the IV antibiotics and on oral antibiotics after f/u with Infectious Disease MD.   He denise claudication, rest pain and non healing ulcers.    He takes a Statin daily, ASA   Past Medical History:  Diagnosis Date   Arthritis    COPD (chronic obstructive pulmonary disease) (HCC)    GERD (gastroesophageal reflux disease)    Hyperlipidemia    Hypertension    Sleep apnea     Past Surgical History:  Procedure Laterality Date   ABDOMINAL AORTOGRAM W/LOWER EXTREMITY Bilateral 09/28/2020   Procedure: ABDOMINAL AORTOGRAM W/LOWER EXTREMITY;  Surgeon: Waynetta Sandy, MD;  Location: [facility];  Service: Cardiovascular;  Laterality: Bilateral;   APPLICATION OF WOUND VAC Right 01/12/2021   Procedure: APPLICATION OF WOUND VAC;  Surgeon: Waynetta Sandy, MD;  Location: [place] [place];  Service: Vascular;  Laterality: Right;   BACK SURGERY     ENDARTERECTOMY FEMORAL Left 11/04/2020   Procedure: LEFT COMMON FEMORAL ARTERY ENDARTERECTOMY;  Surgeon: Waynetta Sandy, MD;  Location: [place];  Service: Vascular;  Laterality: Left;   EYE SURGERY     FEMORAL-FEMORAL BYPASS GRAFT Bilateral 11/04/2020   Procedure: RIGHT TO LEFT FEMORAL-FEMORAL BYPASS;  Surgeon: Waynetta Sandy, MD;  Location: [place];  Service: Vascular;  Laterality: Bilateral;   HERNIA REPAIR     INCISION AND DRAINAGE OF WOUND Right 01/06/2021   Procedure: IRRIGATION AND  DEBRIDEMENT GROIN;  Surgeon: Serafina Mitchell, MD;  Location: [place];  Service: Vascular;  Laterality: Right;   INCISION AND DRAINAGE OF WOUND Right 01/12/2021   Procedure: [facility] OUT AND MUSCLE FLAP;  Surgeon: Waynetta Sandy, MD;  Location: [place];  Service: Vascular;  Laterality: Right;   PATCH ANGIOPLASTY Bilateral 11/04/2020   Procedure: PATCH ANGIOPLASTY LEFT AND RIGHT COMMON FEMORAL ARTERY;  Surgeon: Waynetta Sandy, MD;  Location: [place];  Service: Vascular;  Laterality: Bilateral;   ULTRASOUND GUIDANCE FOR VASCULAR ACCESS Bilateral 11/04/2020   Procedure: ULTRASOUND GUIDANCE FOR VASCULAR ACCESS;  Surgeon: Waynetta Sandy, MD;  Location: [facility] [facility];  Service: Vascular;  Laterality: Bilateral;   WISDOM TOOTH EXTRACTION      ROS:   General:  No weight loss, Fever, chills  HEENT: No recent headaches, no nasal bleeding, no visual changes, no sore throat  Neurologic: No dizziness, blackouts, seizures. No recent symptoms of stroke or mini- stroke. No recent episodes of slurred speech, or temporary blindness.  Cardiac: No recent episodes of chest pain/pressure, no shortness of breath at rest.  No shortness of breath with exertion.  Denies history of atrial fibrillation or irregular heartbeat  Vascular: No history of rest pain in feet.  No history of claudication.  No history of non-healing ulcer, No history of DVT   Pulmonary: No home oxygen, no productive cough, no hemoptysis,  No asthma or wheezing  Musculoskeletal:  '[ ]'$  Arthritis, '[ ]'$  Low back pain,  '[ ]'$  Joint pain  Hematologic:No history of hypercoagulable state.  No history of easy bleeding.  No history of anemia  Gastrointestinal: No hematochezia or melena,  No gastroesophageal reflux, no trouble swallowing  Urinary: '[ ]'$  chronic Kidney disease, '[ ]'$  on HD - '[ ]'$  MWF or '[ ]'$  TTHS, '[ ]'$  Burning with urination, '[ ]'$  Frequent urination, '[ ]'$  Difficulty urinating;   Skin: No rashes  Psychological: No history of  anxiety,  No history of depression  Social History Social History   Tobacco Use   Smoking status: Former    Types: Cigarettes    Quit date: 06/25/2013    Years since quitting: 7.8   Smokeless tobacco: Former    Quit date: 06/25/1976  Vaping Use   Vaping Use: Never used  Substance Use Topics   Alcohol use: Yes    Alcohol/week: 30.0 standard drinks    Types: 30 Cans of beer per week   Drug use: No    Family History Family History  Problem Relation Age of Onset   COPD Mother    Diabetes Mother    Hypertension Father    COPD Father    Heart disease Father    Arthritis Sister     Allergies  Allergies  Allergen Reactions   Amlodipine Swelling and Other (See Comments)    Edema of lower extremities     Current Outpatient Medications  Medication Sig Dispense Refill   albuterol (PROVENTIL HFA;VENTOLIN HFA) 108 (90 BASE) MCG/ACT inhaler Inhale 2 puffs into the lungs every 6 (six) hours as needed for wheezing or shortness of breath.     albuterol (PROVENTIL) (2.5 MG/3ML) 0.083% nebulizer solution Take 2.5 mg by nebulization every 6 (six) hours as needed for wheezing or shortness of breath.     amoxicillin (AMOXIL) 500 MG capsule Take 2 capsules (1,000 mg total) by mouth 2 (two) times daily. 120 capsule 1   aspirin EC 81 MG tablet Take 81 mg by mouth at bedtime. Swallow whole.     benazepril (LOTENSIN) 10 MG tablet Take 1 tablet (10 mg total) by mouth at bedtime. 90 tablet 3   carboxymethylcellulose (REFRESH PLUS) 0.5 % SOLN Place 1 drop into both eyes 3 (three) times daily.     Carboxymethylcellulose Sodium (THERATEARS PF OP) Place 2 drops into both eyes 3 (three) times daily.     cholecalciferol (VITAMIN D) 1000 UNITS tablet Take 1,000 Units by mouth 2 (two) times daily.     Cholecalciferol (VITAMIN D3) 25 MCG (1000 UT) CAPS Take 1,000 Units by mouth 2 (two) times daily.     cilostazol (PLETAL) 100 MG tablet Take 100 mg by mouth 2 (two) times daily.     erythromycin  ophthalmic ointment Apply to eye.     ferrous sulfate 325 (65 FE) MG tablet Take 1 tablet by mouth daily.     folic acid (FOLVITE) 1 MG tablet Take 1 mg by mouth daily.     hydrochlorothiazide (HYDRODIURIL) 25 MG tablet Take 0.5 tablets by mouth daily.     hydrocortisone (ANUSOL-HC) 2.5 % rectal cream Place 1 application rectally 3 (three) times daily as needed for hemorrhoids or anal itching.     hydrocortisone 2.5 % cream Apply 1 application topically 3 (three) times daily as needed (itching). rectally     LACTOBACILLUS PO Take 2 capsules by mouth daily.     Magnesium Oxide 420 MG TABS Take 420 mg by mouth every other day.     meloxicam (MOBIC) 15 MG tablet Take 15 mg  by mouth daily.     MENTHOL-METHYL SALICYLATE EX Apply 1 application topically 2 (two) times daily as needed (for knee pain).     metoprolol (TOPROL-XL) 200 MG 24 hr tablet Take 200 mg by mouth daily.     Multiple Vitamins-Minerals (MULTIVITAMIN WITH MINERALS) tablet Take 1 tablet by mouth daily.     niacin 100 MG tablet Take 100 mg by mouth in the morning.     Omega-3 Fatty Acids (FISH OIL) 1000 MG CAPS Take 1,000 mg by mouth daily.     pantoprazole (PROTONIX) 40 MG tablet Take 40 mg by mouth 2 (two) times daily.     rosuvastatin (CRESTOR) 20 MG tablet Take 10 mg by mouth daily.     thiamine 100 MG tablet Take 100 mg by mouth daily.     tiotropium (SPIRIVA) 18 MCG inhalation capsule Place 18 mcg into inhaler and inhale daily.     WIXELA INHUB 250-50 MCG/ACT AEPB Inhale 1 puff into the lungs in the morning and at bedtime.     oxyCODONE-acetaminophen (PERCOCET) 5-325 MG tablet Take 1 tablet by mouth every 6 (six) hours as needed for severe pain. (Patient not taking: No sig reported) 20 tablet 0   spironolactone (ALDACTONE) 25 MG tablet Take 25 mg by mouth daily. (Patient not taking: No sig reported)     Current Facility-Administered Medications  Medication Dose Route Frequency Provider Last Rate Last Admin   0.9 %  sodium  chloride infusion  250 mL Intravenous PRN Baglia, Corrina, PA-C       alum & mag hydroxide-simeth (MAALOX/MYLANTA) 200-200-20 MG/5ML suspension 15-30 mL  15-30 mL Oral Q2H PRN Baglia, Corrina, PA-C       docusate sodium (COLACE) capsule 100 mg  100 mg Oral BID Baglia, Corrina, PA-C       guaiFENesin-dextromethorphan (ROBITUSSIN DM) 100-10 MG/5ML syrup 15 mL  15 mL Oral Q4H PRN Baglia, Corrina, PA-C       heparin injection 5,000 Units  5,000 Units Subcutaneous Q8H Baglia, Corrina, PA-C       hydrALAZINE (APRESOLINE) injection 5 mg  5 mg Intravenous Q20 Min PRN Baglia, Corrina, PA-C       labetalol (NORMODYNE) injection 10 mg  10 mg Intravenous Q10 min PRN Baglia, Corrina, PA-C       metoprolol tartrate (LOPRESSOR) injection 2-5 mg  2-5 mg Intravenous Q2H PRN Baglia, Corrina, PA-C       ondansetron (ZOFRAN) injection 4 mg  4 mg Intravenous Q6H PRN Baglia, Corrina, PA-C       phenol (CHLORASEPTIC) mouth spray 1 spray  1 spray Mouth/Throat PRN Baglia, Corrina, PA-C       senna-docusate (Senokot-S) tablet 1 tablet  1 tablet Oral QHS PRN Baglia, Corrina, PA-C       sodium chloride flush (NS) 0.9 % injection 3 mL  3 mL Intravenous Q12H Baglia, Corrina, PA-C       sodium chloride flush (NS) 0.9 % injection 3 mL  3 mL Intravenous PRN Baglia, Corrina, PA-C       Facility-Administered Medications Ordered in Other Visits  Medication Dose Route Frequency Provider Last Rate Last Admin   acetaminophen (TYLENOL) tablet 325-650 mg  325-650 mg Oral Q4H PRN Baglia, Corrina, PA-C       Or   acetaminophen (TYLENOL) suppository 320-640 mg  320-640 mg Rectal Q4H PRN Karoline Caldwell, PA-C        Physical Examination  Vitals:   04/28/21 1113  BP: (!) 171/93  Pulse: 85  Resp: 18  Temp: 97.6 F (36.4 C)  TempSrc: Temporal  SpO2: 96%  Weight: 207 lb (93.9 kg)  Height: '5\' 7"'$  (1.702 m)    Body mass index is 32.42 kg/m.  General:  Alert and oriented, no acute distress HEENT: Normal Neck: No bruit or  JVD Pulmonary: Clear to auscultation bilaterally Cardiac: Regular Rate and Rhythm without murmur Abdomen: Soft, non-tender, non-distended, no mass, no scars Skin: No rash Musculoskeletal: minimal B LE edema  Neurologic: Upper and lower extremity motor 5/5 and symmetric  DATA:     ABI Findings:  +---------+------------------+-----+--------+--------+  Right    Rt Pressure (mmHg)IndexWaveformComment   +---------+------------------+-----+--------+--------+  Brachial 167                                      +---------+------------------+-----+--------+--------+  PTA      123               0.74 biphasic          +---------+------------------+-----+--------+--------+  DP       124               0.74 biphasic          +---------+------------------+-----+--------+--------+  Great Toe101               0.60                   +---------+------------------+-----+--------+--------+   +---------+------------------+-----+---------+-------+  Left     Lt Pressure (mmHg)IndexWaveform Comment  +---------+------------------+-----+---------+-------+  Brachial 167                                      +---------+------------------+-----+---------+-------+  PTA      145               0.87 biphasic          +---------+------------------+-----+---------+-------+  DP       145               0.87 triphasic         +---------+------------------+-----+---------+-------+  Great Toe101               0.60                   +---------+------------------+-----+---------+-------+   +-------+-----------+-----------+------------+------------+  ABI/TBIToday's ABIToday's TBIPrevious ABIPrevious TBI  +-------+-----------+-----------+------------+------------+  Right  0.74       0.60       0.80        0.57          +-------+-----------+-----------+------------+------------+  Left   0.87       0.60       0.90        0.60           +-------+-----------+-----------+------------+------------+      Summary:  Right: Resting right ankle-brachial index indicates moderate right lower  extremity arterial disease. The right toe-brachial index is abnormal.   Left: Resting left ankle-brachial index indicates mild left lower  extremity arterial disease. The left toe-brachial index is abnormal.       Right Graft #1: Right to left femoral-femoral  +------------------+--------+--------+---------+--------+                    PSV cm/sStenosisWaveform Comments  +------------------+--------+--------+---------+--------+  Inflow  149             biphasic           +------------------+--------+--------+---------+--------+  Prox Anastomosis  140             triphasic          +------------------+--------+--------+---------+--------+  Proximal Graft    99              biphasic           +------------------+--------+--------+---------+--------+  Mid Graft         59              biphasic           +------------------+--------+--------+---------+--------+  Distal Graft      59              biphasic           +------------------+--------+--------+---------+--------+  Distal Anastomosis46              biphasic           +------------------+--------+--------+---------+--------+  Outflow           96              biphasic           +------------------+--------+--------+---------+--------+      Summary:  Right: Patent right to left femoral-femoral bypass graft with no evidence  of stenosis.    ASSESSMENT/PLAN:  PAD with history of left LE claudication which has now resolved. S/P Status post sartorius flap coverage of femorofemoral bypass and right common femoral bovine pericardial patch angioplasty.   The right groin has completely healed at this point.  The studies show biphasic flow B LE with patent bypass and ABI's reveal biphasic flow to B feet.  He will start a walking  program.  Elevate his legs while at rest and f/u for repeat studies in 9 months.  He will continue to f/u with ID.       Roxy Horseman PA-C [facility] [place] Office: [phone]  MD in clinic [place]

## 2021-04-29 ENCOUNTER — Other Ambulatory Visit: Payer: Self-pay

## 2021-04-29 DIAGNOSIS — I739 Peripheral vascular disease, unspecified: Secondary | ICD-10-CM

## 2021-05-11 ENCOUNTER — Ambulatory Visit: Payer: No Typology Code available for payment source | Admitting: Infectious Diseases

## 2021-08-18 ENCOUNTER — Encounter (HOSPITAL_COMMUNITY): Payer: Self-pay | Admitting: Emergency Medicine

## 2021-08-18 ENCOUNTER — Emergency Department (HOSPITAL_COMMUNITY)
Admission: EM | Admit: 2021-08-18 | Discharge: 2021-08-19 | Disposition: A | Discharge status: Home / Self Care | Payer: No Typology Code available for payment source | Attending: Emergency Medicine | Admitting: Emergency Medicine

## 2021-08-18 ENCOUNTER — Emergency Department (HOSPITAL_COMMUNITY): Payer: No Typology Code available for payment source

## 2021-08-18 DIAGNOSIS — J189 Pneumonia, unspecified organism: Secondary | ICD-10-CM | POA: Diagnosis not present

## 2021-08-18 DIAGNOSIS — Z7951 Long term (current) use of inhaled steroids: Secondary | ICD-10-CM | POA: Diagnosis not present

## 2021-08-18 DIAGNOSIS — R0602 Shortness of breath: Secondary | ICD-10-CM | POA: Diagnosis not present

## 2021-08-18 DIAGNOSIS — Z7982 Long term (current) use of aspirin: Secondary | ICD-10-CM | POA: Diagnosis not present

## 2021-08-18 DIAGNOSIS — Z79899 Other long term (current) drug therapy: Secondary | ICD-10-CM | POA: Diagnosis not present

## 2021-08-18 DIAGNOSIS — R600 Localized edema: Secondary | ICD-10-CM | POA: Insufficient documentation

## 2021-08-18 DIAGNOSIS — I10 Essential (primary) hypertension: Secondary | ICD-10-CM | POA: Insufficient documentation

## 2021-08-18 DIAGNOSIS — J449 Chronic obstructive pulmonary disease, unspecified: Secondary | ICD-10-CM | POA: Insufficient documentation

## 2021-08-18 DIAGNOSIS — Z20822 Contact with and (suspected) exposure to covid-19: Secondary | ICD-10-CM | POA: Insufficient documentation

## 2021-08-18 LAB — CBC WITH DIFFERENTIAL/PLATELET
Abs Immature Granulocytes: 0.05 10*3/uL (ref 0.00–0.07)
Basophils Absolute: 0.1 10*3/uL (ref 0.0–0.1)
Basophils Relative: 1 %
Eosinophils Absolute: 0.1 10*3/uL (ref 0.0–0.5)
Eosinophils Relative: 1 %
HCT: 37.1 % — ABNORMAL LOW (ref 39.0–52.0)
Hemoglobin: 11.9 g/dL — ABNORMAL LOW (ref 13.0–17.0)
Immature Granulocytes: 1 %
Lymphocytes Relative: 22 %
Lymphs Abs: 1.7 10*3/uL (ref 0.7–4.0)
MCH: 30.2 pg (ref 26.0–34.0)
MCHC: 32.1 g/dL (ref 30.0–36.0)
MCV: 94.2 fL (ref 80.0–100.0)
Monocytes Absolute: 0.7 10*3/uL (ref 0.1–1.0)
Monocytes Relative: 9 %
Neutro Abs: 5.1 10*3/uL (ref 1.7–7.7)
Neutrophils Relative %: 66 %
Platelets: 279 10*3/uL (ref 150–400)
RBC: 3.94 MIL/uL — ABNORMAL LOW (ref 4.22–5.81)
RDW: 16.7 % — ABNORMAL HIGH (ref 11.5–15.5)
WBC: 7.8 10*3/uL (ref 4.0–10.5)
nRBC: 0 % (ref 0.0–0.2)

## 2021-08-18 LAB — BASIC METABOLIC PANEL
Anion gap: 10 (ref 5–15)
BUN: 13 mg/dL (ref 8–23)
CO2: 26 mmol/L (ref 22–32)
Calcium: 9.4 mg/dL (ref 8.9–10.3)
Chloride: 92 mmol/L — ABNORMAL LOW (ref 98–111)
Creatinine, Ser: 0.95 mg/dL (ref 0.61–1.24)
GFR, Estimated: >60 mL/min (ref >60)
Glucose, Bld: 98 mg/dL (ref 70–99)
Potassium: 4.2 mmol/L (ref 3.5–5.1)
Sodium: 128 mmol/L — ABNORMAL LOW (ref 135–145)

## 2021-08-18 LAB — PROCALCITONIN: Procalcitonin: <0.1 ng/mL

## 2021-08-18 NOTE — ED Notes (Signed)
Ambulated PT in hallway on pulse oximetry. PT remained at 93% on room air throughout ambulation.

## 2021-08-18 NOTE — ED Provider Triage Note (Signed)
Emergency Medicine Provider Triage Evaluation Note  Sean [name] , a 71 y.o. male  was evaluated in triage.  Pt complains of worsening shortness of breath. Sent by PCP for possible admission for IV antibiotics for pneumonia. Patient diagnosed with pneumonia roughly 4 weeks ago. He has been on 2 rounds of Levaquin with no relief of shortness of breath. He notes his cough has resolved. Denies chest pain. No fever or chills. History of COPD. Not on chronic oxygen at home.   Review of Systems  Positive: SOB Negative: CP  Physical Exam  BP (!) 175/86    Pulse 82    Temp 98 F (36.7 C)    Resp 16    SpO2 92%  Gen:   Awake, no distress   Resp:  Normal effort  MSK:   Moves extremities without difficulty  Other:  2L [place]  Medical Decision Making  Medically screening exam initiated at 1:55 PM.  Appropriate orders placed.  Bekim Werntz was informed that the remainder of the evaluation will be completed by another provider, this initial triage assessment does not replace that evaluation, and the importance of remaining in the ED until their evaluation is complete.  SOB in setting of pneumonia CXR EKG labs   Suzy Bouchard, [place] 08/18/21 1357

## 2021-08-18 NOTE — ED Triage Notes (Signed)
Patient sent to [facility] by PCP for evaluation of pneumonia. Patient states he was diagnosed one month ago and has been taking levaquin but states his shortness of breath has not improved. History of COPD. [place] alert, oriented, afebrile, and in no apparent distress at this time. Patient placed on 2L O2 for SpO2 92% on room air while waiting in lobby.

## 2021-08-18 NOTE — ED Notes (Signed)
Pt did not respond when called for vitals check or MSE signature °

## 2021-08-18 NOTE — ED Provider Notes (Signed)
[facility] EMERGENCY DEPARTMENT Provider Note   CSN: [id number] Arrival date & time: 08/18/21  1039     History  Chief Complaint  Patient presents with   Pneumonia    Sean [name] is a 71 y.o. male with a past medical history of COPD, hypertension, sleep apnea, PAD.  Presents emergency department with a chief complaint of shortness of breath with ambulation.  Patient reports that symptoms presently progressively worsening since the beginning of December.  Patient states that the distance he is able to walk without becoming short of breath has gotten progressively shorter.  Denies any associated chest pain, palpitations, lightheadedness or syncope with exertion.  Patient reports that previously was being treated for pneumonia and was having productive cough.  States that cough has resolved at this time.  Patient reports that he is finished already prescribed antibiotics.  Patient states that he has less minimal swelling to bilateral lower extremities which is baseline for him.  Patient reports that he does not use any home oxygen.  States that he has been checking his SPO2 levels at home which have been ranging 90 to 94% which is normal for him.  Per chart review patient was diagnosed with pneumonia on 07/16/2021.  Patient was treated with Augmentin and doxycycline.  Return on 12/30 with continued symptoms.  Patient had x-ray imaging on 12/30 which showed worsening right lower lung consolidation opacities concerning for infectious/inflammatory process.  Patient was treated with Rocephin and Levaquin.  Patient was seen earlier today at [facility] for reports of worsening exertional dyspnea and wheezing.     Pneumonia Associated symptoms include shortness of breath. Pertinent negatives include no chest pain, no abdominal pain and no headaches.      Home Medications Prior to Admission medications   Medication Sig Start Date End Date Taking? Authorizing Provider  albuterol (PROVENTIL  HFA;VENTOLIN HFA) 108 (90 BASE) MCG/ACT inhaler Inhale 2 puffs into the lungs every 6 (six) hours as needed for wheezing or shortness of breath.    [provider]  albuterol (PROVENTIL) (2.5 MG/3ML) 0.083% nebulizer solution Take 2.5 mg by nebulization every 6 (six) hours as needed for wheezing or shortness of breath.    [provider]  amoxicillin (AMOXIL) 500 MG capsule Take 2 capsules (1,000 mg total) by mouth 2 (two) times daily. 04/05/21   [name], MD  aspirin EC 81 MG tablet Take 81 mg by mouth at bedtime. Swallow whole.    [provider]  benazepril (LOTENSIN) 10 MG tablet Take 1 tablet (10 mg total) by mouth at bedtime. 10/05/20   Freada Bergeron, MD  carboxymethylcellulose (REFRESH PLUS) 0.5 % SOLN Place 1 drop into both eyes 3 (three) times daily. 12/04/20   [provider]  Carboxymethylcellulose Sodium (THERATEARS PF OP) Place 2 drops into both eyes 3 (three) times daily.    [provider]  cholecalciferol (VITAMIN D) 1000 UNITS tablet Take 1,000 Units by mouth 2 (two) times daily.    [provider]  Cholecalciferol (VITAMIN D3) 25 MCG (1000 UT) CAPS Take 1,000 Units by mouth 2 (two) times daily.    [provider]  cilostazol (PLETAL) 100 MG tablet Take 100 mg by mouth 2 (two) times daily. 11/21/19   [provider]  erythromycin ophthalmic ointment Apply to eye. 12/22/20   [provider]  ferrous sulfate 325 (65 FE) MG tablet Take 1 tablet by mouth daily. 12/02/20   [provider]  folic acid (FOLVITE) 1 MG  tablet Take 1 mg by mouth daily. 06/22/20   [provider]  hydrochlorothiazide (HYDRODIURIL) 25 MG tablet Take 0.5 tablets by mouth daily. 04/13/21   [provider]  hydrocortisone (ANUSOL-HC) 2.5 % rectal cream Place 1 application rectally 3 (three) times daily as needed for hemorrhoids or anal itching.    [provider]  hydrocortisone 2.5 % cream  Apply 1 application topically 3 (three) times daily as needed (itching). rectally    [provider]  LACTOBACILLUS PO Take 2 capsules by mouth daily.    [provider]  Magnesium Oxide 420 MG TABS Take 420 mg by mouth every other day. 06/22/20   [provider]  meloxicam (MOBIC) 15 MG tablet Take 15 mg by mouth daily.    [provider]  MENTHOL-METHYL SALICYLATE EX Apply 1 application topically 2 (two) times daily as needed (for knee pain).    [provider]  metoprolol (TOPROL-XL) 200 MG 24 hr tablet Take 200 mg by mouth daily.    [provider]  Multiple Vitamins-Minerals (MULTIVITAMIN WITH MINERALS) tablet Take 1 tablet by mouth daily.    [provider]  niacin 100 MG tablet Take 100 mg by mouth in the morning.    [provider]  Omega-3 Fatty Acids (FISH OIL) 1000 MG CAPS Take 1,000 mg by mouth daily.    [provider]  oxyCODONE-acetaminophen (PERCOCET) 5-325 MG tablet Take 1 tablet by mouth every 6 (six) hours as needed for severe pain. Patient not taking: No sig reported 01/15/21 01/15/22  Dagoberto Ligas, PA-C  pantoprazole (PROTONIX) 40 MG tablet Take 40 mg by mouth 2 (two) times daily.    [provider]  rosuvastatin (CRESTOR) 20 MG tablet Take 10 mg by mouth daily. 06/22/20   [provider]  spironolactone (ALDACTONE) 25 MG tablet Take 25 mg by mouth daily. Patient not taking: No sig reported 12/22/20   [provider]  thiamine 100 MG tablet Take 100 mg by mouth daily.    [provider]  tiotropium (SPIRIVA) 18 MCG inhalation capsule Place 18 mcg into inhaler and inhale daily.    [provider]  Grant Ruts INHUB 250-50 MCG/ACT AEPB Inhale 1 puff into the lungs in the morning and at bedtime.    [provider]      Allergies    Amlodipine    Review of Systems   Review of Systems  Constitutional:  Negative for chills and fever.  Eyes:  Negative  for visual disturbance.  Respiratory:  Positive for shortness of breath. Negative for cough.   Cardiovascular:  Negative for chest pain, palpitations and leg swelling.  Gastrointestinal:  Negative for abdominal pain, nausea and vomiting.  Genitourinary:  Negative for difficulty urinating, dysuria, hematuria and urgency.  Musculoskeletal:  Negative for back pain and neck pain.  Skin:  Negative for color change and rash.  Neurological:  Negative for dizziness, syncope, light-headedness and headaches.  Psychiatric/Behavioral:  Negative for confusion.    Physical Exam Updated Vital Signs BP (!) 176/92 (BP Location: Left Arm)    Pulse 79    Temp 98 F (36.7 C)    Resp (!) 22    SpO2 100%  Physical Exam Vitals and nursing note reviewed.  Constitutional:      General: He is not in acute distress.    Appearance: He is not ill-appearing, toxic-appearing or diaphoretic.     Interventions: Nasal cannula in place.     Comments: On 1 LPM  of O2 via nasal cannula  HENT:     Head: Normocephalic.  Eyes:     General: No scleral icterus.       Right eye: No discharge.        Left eye: No discharge.  Cardiovascular:     Rate and Rhythm: Normal rate.  Pulmonary:     Effort: Pulmonary effort is normal. No tachypnea, bradypnea or respiratory distress.     Breath sounds: Normal breath sounds. No stridor. No decreased breath sounds, wheezing, rhonchi or rales.     Comments: Speaks in full sentences without difficulty Musculoskeletal:     Comments: Trace edema to bilateral lower extremities no tenderness on palpation.  Skin:    General: Skin is warm and dry.  Neurological:     General: No focal deficit present.     Mental Status: He is alert.  Psychiatric:        Behavior: Behavior is cooperative.    ED Results / Procedures / Treatments   Labs (all labs ordered are listed, but only abnormal results are displayed) Labs Reviewed  CBC WITH DIFFERENTIAL/PLATELET - Abnormal; Notable for the  following components:      Result Value   RBC 3.94 (*)    Hemoglobin 11.9 (*)    HCT 37.1 (*)    RDW 16.7 (*)    All other components within normal limits  BASIC METABOLIC PANEL - Abnormal; Notable for the following components:   Sodium 128 (*)    Chloride 92 (*)    All other components within normal limits  D-DIMER, QUANTITATIVE - Abnormal; Notable for the following components:   D-Dimer, Quant 2.34 (*)    All other components within normal limits  TROPONIN I (HIGH SENSITIVITY) - Abnormal; Notable for the following components:   Troponin I (High Sensitivity) 19 (*)    All other components within normal limits  TROPONIN I (HIGH SENSITIVITY) - Abnormal; Notable for the following components:   Troponin I (High Sensitivity) 19 (*)    All other components within normal limits  RESP PANEL BY RT-PCR (FLU A&B, COVID) [id number]  PROCALCITONIN    EKG EKG Interpretation  Date/Time:  Wednesday August 18 2021 13:32:46 EST Ventricular Rate:  80 PR Interval:  192 QRS Duration: 80 QT Interval:  370 QTC Calculation: 426 R Axis:   -55 Text Interpretation: Normal sinus rhythm Left axis deviation Septal infarct , age undetermined Abnormal ECG When compared with ECG of 28-Sep-2020 08:03, PREVIOUS ECG IS PRESENT No significant change since last tracing Confirmed by Deno Etienne [phone]) on 08/18/2021 10:29:16 PM  Radiology DG Chest 2 View  Result Date: 08/18/2021 CLINICAL DATA:  Shortness of breath EXAM: CHEST - 2 VIEW COMPARISON:  None. FINDINGS: The heart size and mediastinal contours are within normal limits. Bibasilar subsegmental linear atelectasis. The visualized skeletal structures are unremarkable. IMPRESSION: No active cardiopulmonary disease. Electronically Signed   By: Keane Police D.O.   On: 08/18/2021 14:17    Procedures Procedures    Medications Ordered in ED Medications  iohexol (OMNIPAQUE) 350 MG/ML injection 100 mL (100 mLs Intravenous Contrast Given 08/19/21 0206)    ED Course/  Medical Decision Making/ A&P                           Medical Decision Making  Alert 71 year old male in no acute distress, nontoxic-appearing.  Presents to the emergency department with chief plaint of shortness of breath with exertion.  Patient's care  is complicated by history of COPD.  Shortness of breath has been present over the last month and progressively worsening over this time.  Patient reports that this is in which she becomes short short of breath while walking has become shorter.  Per chart review patient was diagnosed with pneumonia on 07/16/2021.  Patient was treated with Augmentin and doxycycline.  Return on 12/30 with continued symptoms.  Patient had x-ray imaging on 12/30 which showed worsening right lower lung consolidation opacities concerning for infectious/inflammatory process.  Patient was treated with Rocephin and Levaquin.  Patient was seen earlier today at [facility] for reports of worsening exertional dyspnea and wheezing.  Patient denies any home oxygen use.  At present on 1 LPM of O2 via nasal cannula.  X-ray imaging bilateral upper obtained while patient was in triage.  All imaging and lab work were independently reviewed myself  Chest x-ray shows no active cardiopulmonary disease CBC shows no leukocytosis.  Mild anemia which appears baseline for patient: Procalcitonin within normal limits BMP shows hyponatremia at 128.  Per chart review patient has had decreased settings in the past.  At this time low suspicion that patient's shortness of breath is due to bacterial pneumonia infection.  Due to shortness of breath being present with exertion will check troponin to look for possible cardiac source.  Due to prolonged shortness of breath we will also obtain D-dimer to look for possible PE.  Will obtain COVID-19 and influenza testing.  We will plan to ambulate patient and check SPO2.  Patient oxygen saturation remained at 90% on room air with ambulation.  D-dimer elevated.   CTA chest ordered.  CTA shows no signs of PE.  CTA does show multivessel coronary artery calcification noted, complication of aortic valve leaflets.  31mm  solid pulmonary nodule to right lower lobe.  Troponin 19 and 19 with delta of 0 EKG shows normal sinus rhythm, no significant change from previous tracing Suspicion for ACS at this time.  Due to patient having exertional shortness of breath will need to follow-up with cardiology in outpatient setting for further evaluation.  Discussed findings of patient CTA chest with patient.  Advised him of the importance to follow-up with pulmonologist/PCP to coordinate repeat imaging of his pulmonary nodule.  Discussed importance of cardiology follow-up. Discussed results, findings, treatment and follow up. Patient advised of return precautions. Patient verbalized understanding and agreed with plan.   Patient was discussed with attending physician Dr. Dina Rich.         Final Clinical Impression(s) / ED Diagnoses Final diagnoses:  Exertional shortness of breath    Rx / DC Orders ED Discharge Orders     None         Loni Beckwith, PA-C 08/19/21 2355    Merryl Hacker, MD 08/25/21 [phone]

## 2021-08-19 ENCOUNTER — Emergency Department (HOSPITAL_COMMUNITY): Payer: No Typology Code available for payment source

## 2021-08-19 LAB — TROPONIN I (HIGH SENSITIVITY)
Troponin I (High Sensitivity): 19 ng/L — ABNORMAL HIGH (ref <18)
Troponin I (High Sensitivity): 19 ng/L — ABNORMAL HIGH (ref <18)

## 2021-08-19 LAB — D-DIMER, QUANTITATIVE: D-Dimer, Quant: 2.34 ug/mL-FEU — ABNORMAL HIGH (ref 0.00–0.50)

## 2021-08-19 LAB — RESP PANEL BY RT-PCR (FLU A&B, COVID) ARPGX2
Influenza A by PCR: NEGATIVE
Influenza B by PCR: NEGATIVE
SARS Coronavirus 2 by RT PCR: NEGATIVE

## 2021-08-19 MED ORDER — IOHEXOL 350 MG/ML SOLN
100.0000 mL | Freq: Once | INTRAVENOUS | Status: AC | PRN
  Administered 2021-08-19: 100 mL via INTRAVENOUS

## 2021-08-19 NOTE — Discharge Instructions (Addendum)
You came to the emergency department today to be evaluated for your shortness of breath.  Your chest x-ray showed no signs of pneumonia.  The CT scan of your chest showed no signs of pulmonary embolus also known as blood clots.  The CT scan of your chest did show a 7 mm right solid pulmonary nodule within the right lower lobe.  It is very important that you follow-up with your pulmonologist and/or your primary care provider about this finding to schedule repeat CT scans in the future.  Due to your shortness of breath being present with exertion I have placed an ambulatory referral for cardiology for further evaluation in the outpatient setting.  Get help right away if: Your shortness of breath gets worse. You have shortness of breath when you are resting. You feel light-headed or you faint. You have a cough that is not controlled with medicines. You cough up blood. You have pain with breathing. You have pain in your chest, arms, shoulders, or abdomen. You have a fever.

## 2022-02-04 ENCOUNTER — Telehealth (HOSPITAL_COMMUNITY): Payer: Self-pay | Admitting: *Deleted

## 2022-02-04 ENCOUNTER — Other Ambulatory Visit: Payer: Self-pay | Admitting: *Deleted

## 2022-02-04 DIAGNOSIS — I739 Peripheral vascular disease, unspecified: Secondary | ICD-10-CM

## 2022-02-04 DIAGNOSIS — Z95828 Presence of other vascular implants and grafts: Secondary | ICD-10-CM

## 2022-02-23 ENCOUNTER — Ambulatory Visit (HOSPITAL_COMMUNITY)
Admission: RE | Admit: 2022-02-23 | Discharge: 2022-02-23 | Disposition: A | Discharge status: Home / Self Care | Payer: Medicare PPO | Source: Ambulatory Visit | Attending: Vascular Surgery | Admitting: Vascular Surgery

## 2022-02-23 ENCOUNTER — Ambulatory Visit (INDEPENDENT_AMBULATORY_CARE_PROVIDER_SITE_OTHER)
Admission: RE | Admit: 2022-02-23 | Discharge: 2022-02-23 | Disposition: A | Discharge status: Home / Self Care | Payer: Medicare PPO | Source: Ambulatory Visit | Attending: Vascular Surgery | Admitting: Vascular Surgery

## 2022-02-23 ENCOUNTER — Ambulatory Visit (INDEPENDENT_AMBULATORY_CARE_PROVIDER_SITE_OTHER): Payer: Medicare PPO | Admitting: Physician Assistant

## 2022-02-23 VITALS — BP 188/93 | HR 75 | Temp 98.2°F | Resp 20 | Ht 67.0 in | Wt 227.4 lb

## 2022-02-23 DIAGNOSIS — Z95828 Presence of other vascular implants and grafts: Secondary | ICD-10-CM

## 2022-02-23 DIAGNOSIS — I739 Peripheral vascular disease, unspecified: Secondary | ICD-10-CM | POA: Diagnosis present

## 2022-02-23 NOTE — Progress Notes (Signed)
Office Note     CC:  follow up Requesting Provider:  [facility]  HPI: Sean [name] is a 71 y.o. (07/16/1951) male who presents for surveillance of PAD with history of right to left femoral to femoral bypass graft.  Surgical history significant for bilateral common femoral endarterectomies with right to left femoral to femoral bypass by Dr. Donzetta Matters on 11/04/2020.  Right groin subsequently required sartorius muscle flap with wound VAC in order to heal.  Patient denies any claudication, rest pain, or nonhealing wounds of bilateral lower extremities.  He has advanced COPD and has shortness of breath even with speaking.  He admittedly does not walk much due to his respiratory status.  He has a follow-up with his pulmonologist in the near future.  He quit smoking about 10 years ago.   Past Medical History:  Diagnosis Date   Arthritis    COPD (chronic obstructive pulmonary disease) ([place])    GERD (gastroesophageal reflux disease)    Hyperlipidemia    Hypertension    Sleep apnea     Past Surgical History:  Procedure Laterality Date   ABDOMINAL AORTOGRAM W/LOWER EXTREMITY Bilateral 09/28/2020   Procedure: ABDOMINAL AORTOGRAM W/LOWER EXTREMITY;  Surgeon: Waynetta Sandy, MD;  Location: [place] [facility];  Service: Cardiovascular;  Laterality: Bilateral;   APPLICATION OF WOUND VAC Right 01/12/2021   Procedure: APPLICATION OF WOUND VAC;  Surgeon: Waynetta Sandy, MD;  Location: [place];  Service: Vascular;  Laterality: Right;   BACK SURGERY     ENDARTERECTOMY FEMORAL Left 11/04/2020   Procedure: LEFT COMMON FEMORAL ARTERY ENDARTERECTOMY;  Surgeon: Waynetta Sandy, MD;  Location: [place];  Service: Vascular;  Laterality: Left;   EYE SURGERY     FEMORAL-FEMORAL BYPASS GRAFT Bilateral 11/04/2020   Procedure: RIGHT TO LEFT FEMORAL-FEMORAL BYPASS;  Surgeon: Waynetta Sandy, MD;  Location: [place];  Service: Vascular;  Laterality: Bilateral;   HERNIA REPAIR      INCISION AND DRAINAGE OF WOUND Right 01/06/2021   Procedure: IRRIGATION AND DEBRIDEMENT GROIN;  Surgeon: Serafina Mitchell, MD;  Location: [place];  Service: Vascular;  Laterality: Right;   INCISION AND DRAINAGE OF WOUND Right 01/12/2021   Procedure: [facility] OUT AND MUSCLE FLAP;  Surgeon: Waynetta Sandy, MD;  Location: [place];  Service: Vascular;  Laterality: Right;   PATCH ANGIOPLASTY Bilateral 11/04/2020   Procedure: PATCH ANGIOPLASTY LEFT AND RIGHT COMMON FEMORAL ARTERY;  Surgeon: Waynetta Sandy, MD;  Location: [place];  Service: Vascular;  Laterality: Bilateral;   ULTRASOUND GUIDANCE FOR VASCULAR ACCESS Bilateral 11/04/2020   Procedure: ULTRASOUND GUIDANCE FOR VASCULAR ACCESS;  Surgeon: Waynetta Sandy, MD;  [facility];  Service: Vascular;  Laterality: Bilateral;   WISDOM TOOTH EXTRACTION      Social History   Socioeconomic History   Marital status: Single    Spouse name: Not on file   Number of children: Not on file   Years of education: Not on file   Highest education level: Not on file  Occupational History   Not on file  Tobacco Use   Smoking status: Former    Types: Cigarettes    Quit date: 06/25/2013    Years since quitting: 8.6    Passive exposure: Never   Smokeless tobacco: Former    Quit date: 06/25/1976  Vaping Use   Vaping Use: Never used  Substance and Sexual Activity   Alcohol use: Yes    Alcohol/week: 30.0 standard drinks of alcohol  Types: 30 Cans of beer per week   Drug use: No   Sexual activity: Not on file  Other Topics Concern   Not on file  Social History Narrative   Not on file   Social Determinants of Health   Financial Resource Strain: Not on file  Food Insecurity: Not on file  Transportation Needs: Not on file  Physical Activity: Not on file  Stress: Not on file  Social Connections: Not on file  Intimate Partner Violence: Not on file    Family History  Problem Relation Age of Onset   COPD Mother     Diabetes Mother    Hypertension Father    COPD Father    Heart disease Father    Arthritis Sister     Current Outpatient Medications  Medication Sig Dispense Refill   albuterol (PROVENTIL HFA;VENTOLIN HFA) 108 (90 BASE) MCG/ACT inhaler Inhale 2 puffs into the lungs every 6 (six) hours as needed for wheezing or shortness of breath.     albuterol (PROVENTIL) (2.5 MG/3ML) 0.083% nebulizer solution Take 2.5 mg by nebulization every 6 (six) hours as needed for wheezing or shortness of breath.     amoxicillin (AMOXIL) 500 MG capsule Take 2 capsules (1,000 mg total) by mouth 2 (two) times daily. 120 capsule 1   aspirin EC 81 MG tablet Take 81 mg by mouth at bedtime. Swallow whole.     benazepril (LOTENSIN) 10 MG tablet Take 1 tablet (10 mg total) by mouth at bedtime. 90 tablet 3   carboxymethylcellulose (REFRESH PLUS) 0.5 % SOLN Place 1 drop into both eyes 3 (three) times daily.     Carboxymethylcellulose Sodium (THERATEARS PF OP) Place 2 drops into both eyes 3 (three) times daily.     cholecalciferol (VITAMIN D) 1000 UNITS tablet Take 1,000 Units by mouth 2 (two) times daily.     Cholecalciferol (VITAMIN D3) 25 MCG (1000 UT) CAPS Take 1,000 Units by mouth 2 (two) times daily.     cilostazol (PLETAL) 100 MG tablet Take 100 mg by mouth 2 (two) times daily.     erythromycin ophthalmic ointment Apply to eye.     ferrous sulfate 325 (65 FE) MG tablet Take 1 tablet by mouth daily.     folic acid (FOLVITE) 1 MG tablet Take 1 mg by mouth daily.     hydrochlorothiazide (HYDRODIURIL) 25 MG tablet Take 0.5 tablets by mouth daily.     hydrocortisone (ANUSOL-HC) 2.5 % rectal cream Place 1 application rectally 3 (three) times daily as needed for hemorrhoids or anal itching.     hydrocortisone 2.5 % cream Apply 1 application topically 3 (three) times daily as needed (itching). rectally     LACTOBACILLUS PO Take 2 capsules by mouth daily.     Magnesium Oxide 420 MG TABS Take 420 mg by mouth every other day.      meloxicam (MOBIC) 15 MG tablet Take 15 mg by mouth daily.     MENTHOL-METHYL SALICYLATE EX Apply 1 application topically 2 (two) times daily as needed (for knee pain).     metoprolol (TOPROL-XL) 200 MG 24 hr tablet Take 200 mg by mouth daily.     Multiple Vitamins-Minerals (MULTIVITAMIN WITH MINERALS) tablet Take 1 tablet by mouth daily.     niacin 100 MG tablet Take 100 mg by mouth in the morning.     Omega-3 Fatty Acids (FISH OIL) 1000 MG CAPS Take 1,000 mg by mouth daily.     pantoprazole (PROTONIX) 40 MG tablet Take  40 mg by mouth 2 (two) times daily.     rosuvastatin (CRESTOR) 20 MG tablet Take 10 mg by mouth daily.     spironolactone (ALDACTONE) 25 MG tablet Take 25 mg by mouth daily.     thiamine 100 MG tablet Take 100 mg by mouth daily.     Current Facility-Administered Medications  Medication Dose Route Frequency Provider Last Rate Last Admin   0.9 %  sodium chloride infusion  250 mL Intravenous PRN Baglia, Corrina, PA-C       alum & mag hydroxide-simeth (MAALOX/MYLANTA) 200-200-20 MG/5ML suspension 15-30 mL  15-30 mL Oral Q2H PRN Baglia, Corrina, PA-C       docusate sodium (COLACE) capsule 100 mg  100 mg Oral BID Baglia, Corrina, PA-C       guaiFENesin-dextromethorphan (ROBITUSSIN DM) 100-10 MG/5ML syrup 15 mL  15 mL Oral Q4H PRN Baglia, Corrina, PA-C       heparin injection 5,000 Units  5,000 Units Subcutaneous Q8H Baglia, Corrina, PA-C       hydrALAZINE (APRESOLINE) injection 5 mg  5 mg Intravenous Q20 Min PRN Baglia, Corrina, PA-C       labetalol (NORMODYNE) injection 10 mg  10 mg Intravenous Q10 min PRN Baglia, Corrina, PA-C       metoprolol tartrate (LOPRESSOR) injection 2-5 mg  2-5 mg Intravenous Q2H PRN Baglia, Corrina, PA-C       ondansetron (ZOFRAN) injection 4 mg  4 mg Intravenous Q6H PRN Baglia, Corrina, PA-C       phenol (CHLORASEPTIC) mouth spray 1 spray  1 spray Mouth/Throat PRN Baglia, Corrina, PA-C       senna-docusate (Senokot-S) tablet 1 tablet  1 tablet Oral QHS  PRN Baglia, Corrina, PA-C       sodium chloride flush (NS) 0.9 % injection 3 mL  3 mL Intravenous Q12H Baglia, Corrina, PA-C       sodium chloride flush (NS) 0.9 % injection 3 mL  3 mL Intravenous PRN Baglia, Corrina, PA-C       Facility-Administered Medications Ordered in Other Visits  Medication Dose Route Frequency Provider Last Rate Last Admin   acetaminophen (TYLENOL) tablet 325-650 mg  325-650 mg Oral Q4H PRN Baglia, Corrina, PA-C       Or   acetaminophen (TYLENOL) suppository 320-640 mg  320-640 mg Rectal Q4H PRN Baglia, Corrina, PA-C        Allergies  Allergen Reactions   Amlodipine Swelling and Other (See Comments)    Edema of lower extremities     REVIEW OF SYSTEMS:   '[X]'$  denotes positive finding, '[ ]'$  denotes negative finding Cardiac  Comments:  Chest pain or chest pressure:    Shortness of breath upon exertion:    Short of breath when lying flat:    Irregular heart rhythm:        Vascular    Pain in calf, thigh, or hip brought on by ambulation:    Pain in feet at night that wakes you up from your sleep:     Blood clot in your veins:    Leg swelling:         Pulmonary    Oxygen at home:    Productive cough:     Wheezing:         Neurologic    Sudden weakness in arms or legs:     Sudden numbness in arms or legs:     Sudden onset of difficulty speaking or slurred speech:    Temporary loss of vision  in one eye:     Problems with dizziness:         Gastrointestinal    Blood in stool:     Vomited blood:         Genitourinary    Burning when urinating:     Blood in urine:        Psychiatric    Major depression:         Hematologic    Bleeding problems:    Problems with blood clotting too easily:        Skin    Rashes or ulcers:        Constitutional    Fever or chills:      PHYSICAL EXAMINATION:  Vitals:   02/23/22 1227  BP: (!) 188/93  Pulse: 75  Resp: 20  Temp: 98.2 F (36.8 C)  TempSrc: Temporal  SpO2: 94%  Weight: 227 lb 6.4 oz  (103.1 kg)  Height: '5\' 7"'$  (1.702 m)    General:  WDWN in NAD; vital signs documented above Gait: Not observed HENT: WNL, normocephalic Pulmonary: Pursed lip breathing after changing position on the exam table Cardiac: regular HR Abdomen: soft, NT, no masses Skin: without rashes Vascular Exam/pulses; 1+ femoral pulse bilaterally Extremities: without ischemic changes, without Gangrene , without cellulitis; without open wounds;  Musculoskeletal: no muscle wasting or atrophy  Neurologic: A&O X 3;  No focal weakness or paresthesias are detected Psychiatric:  The pt has Normal affect.   Non-Invasive Vascular Imaging:   Patent right to left femoral to femoral bypass however flow seems somewhat sluggish based on duplex  ABIs unchanged over the past 9 months ABI/TBIToday's ABIToday's TBIPrevious ABIPrevious TBI  +-------+-----------+-----------+------------+------------+  Right  0.79       0.59       0.74        0.60          +-------+-----------+-----------+------------+------------+  Left   0.89       0.56       0.87        0.60          +-------+-----------+-----------+------------+------------+   ASSESSMENT/PLAN:: 71 y.o. male here for follow up for surveillance of femoral to femoral bypass graft  -Bilateral lower extremities are well-perfused without tissue loss or rest pain -Femoral to femoral bypass graft remains patent however does have some sluggish flow.  Based on duplex he may have distal anastomosis stenosis.  After discussion with Dr. Donzetta Matters patient will come back for recheck in 6 months time.  He will call/return office sooner with any questions or concerns.  Patient states he is unable to lie flat without shortness of breath.  This would complicate angiography efforts in the Cath Lab however given his respiratory status he would be at high risk under general anesthesia in the hybrid operating room.  Hopefully we can continue to follow his exam and duplex and  proceed conservatively without intervention.   Dagoberto Ligas, PA-C [facility] [phone]  Clinic MD:

## 2022-02-25 ENCOUNTER — Other Ambulatory Visit: Payer: Self-pay

## 2022-02-25 DIAGNOSIS — I739 Peripheral vascular disease, unspecified: Secondary | ICD-10-CM

## 2022-03-11 ENCOUNTER — Encounter (HOSPITAL_COMMUNITY): Payer: Self-pay

## 2022-03-17 ENCOUNTER — Telehealth (HOSPITAL_COMMUNITY): Payer: Self-pay | Admitting: *Deleted

## 2022-03-21 ENCOUNTER — Encounter (HOSPITAL_COMMUNITY): Payer: Self-pay

## 2022-03-21 ENCOUNTER — Encounter (HOSPITAL_COMMUNITY)
Admission: RE | Admit: 2022-03-21 | Discharge: 2022-03-21 | Disposition: A | Discharge status: Home / Self Care | Payer: No Typology Code available for payment source | Source: Ambulatory Visit | Attending: Pulmonary Disease | Admitting: Pulmonary Disease

## 2022-03-21 VITALS — BP 134/72 | HR 82 | Ht 66.0 in | Wt 231.5 lb

## 2022-03-21 DIAGNOSIS — J449 Chronic obstructive pulmonary disease, unspecified: Secondary | ICD-10-CM | POA: Diagnosis present

## 2022-03-21 NOTE — Progress Notes (Signed)
Sean [name] 71 y.o. male Pulmonary Rehab Orientation Note This patient who was referred to Pulmonary Rehab by Dr. Chapman Fitch [employer]) with the diagnosis of COPD unspecified arrived today in Cardiac and Pulmonary Rehab. He arrived ambulatory with normal gait. He does not carry portable oxygen. Per patient, Sean [name] has used oxygen in the past but does not have it anymore. Color good, skin warm and dry. Patient is oriented to time and place. Patient's medical history, psychosocial health, and medications reviewed. Psychosocial assessment reveals patient lives with alone. Sean [name] is currently retired. Patient hobbies include watching tv. Patient reports his stress level is moderate. Areas of stress/anxiety include finances. Patient does not exhibit signs of depression. PHQ2/9 score 0/0. Nassim shows good  coping skills with positive outlook on life. Offered emotional support and reassurance. Will continue to monitor. Physical assessment performed by Maurice Small RN. Please see their orientation physical assessment note. Sean [name] reports he  does take medications as prescribed. Patient states he  follows a regular  diet. The patient has been trying to lose weight through a healthy diet and exercise program.. Patient's weight will be monitored closely. Demonstration and practice of PLB using pulse oximeter. Sean [name] able to return demonstration satisfactorily. Safety and hand hygiene in the exercise area reviewed with patient. Sean [name] voices understanding of the information reviewed. Department expectations discussed with patient and achievable goals were set. The patient shows enthusiasm about attending the program and we look forward to working with Sean [name]. Sean [name] completed a 6 min walk test today and is scheduled to begin exercise on 03/29/22 at 10:15 am.   [id number] Sean Plumber, MS, ACSM-CEP

## 2022-03-21 NOTE — Progress Notes (Signed)
Pulmonary Individual Treatment Plan  Patient Details  Name: Sean [name] MRN: [id number] Date of Birth: 07-10-1951 Referring Provider:   [name] Pulmonary Rehab Walk Test from 03/21/2022 in [place]  Referring Provider [name]  [[name]]       Initial Encounter Date:  Flowsheet Row Pulmonary Rehab Walk Test from 03/21/2022 in [place]  Date 03/21/22       Visit Diagnosis: Chronic obstructive pulmonary disease, unspecified COPD type ([place])  Patient's Home Medications on Admission:   Current Outpatient Medications:    albuterol (PROVENTIL HFA;VENTOLIN HFA) 108 (90 BASE) MCG/ACT inhaler, Inhale 2 puffs into the lungs every 6 (six) hours as needed for wheezing or shortness of breath., Disp: , Rfl:    albuterol (PROVENTIL) (2.5 MG/3ML) 0.083% nebulizer solution, Take 2.5 mg by nebulization every 6 (six) hours as needed for wheezing or shortness of breath., Disp: , Rfl:    amLODipine (NORVASC) 2.5 MG tablet, Take 2.5 mg by mouth daily., Disp: , Rfl:    amoxicillin (AMOXIL) 500 MG capsule, Take 2 capsules (1,000 mg total) by mouth 2 (two) times daily., Disp: 120 capsule, Rfl: 1   aspirin EC 81 MG tablet, Take 81 mg by mouth at bedtime. Swallow whole., Disp: , Rfl:    benazepril (LOTENSIN) 10 MG tablet, Take 1 tablet (10 mg total) by mouth at bedtime., Disp: 90 tablet, Rfl: 3   carboxymethylcellulose (REFRESH PLUS) 0.5 % SOLN, Place 1 drop into both eyes 3 (three) times daily., Disp: , Rfl:    Carboxymethylcellulose Sodium (THERATEARS PF OP), Place 2 drops into both eyes 3 (three) times daily., Disp: , Rfl:    cholecalciferol (VITAMIN D) 1000 UNITS tablet, Take 1,000 Units by mouth 2 (two) times daily., Disp: , Rfl:    Cholecalciferol (VITAMIN D3) 25 MCG (1000 UT) CAPS, Take 1,000 Units by mouth 2 (two) times daily., Disp: , Rfl:    cilostazol (PLETAL) 100 MG tablet, Take 100 mg by mouth 2 (two) times daily., Disp: , Rfl:     ferrous sulfate 325 (65 FE) MG tablet, Take 1 tablet by mouth daily., Disp: , Rfl:    folic acid (FOLVITE) 1 MG tablet, Take 1 mg by mouth daily., Disp: , Rfl:    hydrochlorothiazide (HYDRODIURIL) 25 MG tablet, Take 0.5 tablets by mouth daily., Disp: , Rfl:    hydrocortisone (ANUSOL-HC) 2.5 % rectal cream, Place 1 application rectally 3 (three) times daily as needed for hemorrhoids or anal itching., Disp: , Rfl:    hydrocortisone 2.5 % cream, Apply 1 application topically 3 (three) times daily as needed (itching). rectally, Disp: , Rfl:    LACTOBACILLUS PO, Take 2 capsules by mouth daily., Disp: , Rfl:    Magnesium Oxide 420 MG TABS, Take 420 mg by mouth every other day., Disp: , Rfl:    meloxicam (MOBIC) 15 MG tablet, Take 15 mg by mouth daily., Disp: , Rfl:    MENTHOL-METHYL SALICYLATE EX, Apply 1 application topically 2 (two) times daily as needed (for knee pain)., Disp: , Rfl:    metoprolol (TOPROL-XL) 200 MG 24 hr tablet, Take 200 mg by mouth daily., Disp: , Rfl:    Multiple Vitamins-Minerals (MULTIVITAMIN WITH MINERALS) tablet, Take 1 tablet by mouth daily., Disp: , Rfl:    niacin 100 MG tablet, Take 100 mg by mouth in the morning., Disp: , Rfl:    Omega-3 Fatty Acids (FISH OIL) 1000 MG CAPS, Take 1,000 mg by mouth daily., Disp: ,  Rfl:    pantoprazole (PROTONIX) 40 MG tablet, Take 40 mg by mouth 2 (two) times daily., Disp: , Rfl:    rosuvastatin (CRESTOR) 20 MG tablet, Take 10 mg by mouth daily., Disp: , Rfl:    spironolactone (ALDACTONE) 25 MG tablet, Take 25 mg by mouth daily., Disp: , Rfl:    thiamine 100 MG tablet, Take 100 mg by mouth daily., Disp: , Rfl:    erythromycin ophthalmic ointment, Apply to eye. (Patient not taking: Reported on 03/21/2022), Disp: , Rfl:   Current Facility-Administered Medications:    0.9 %  sodium chloride infusion, 250 mL, Intravenous, PRN, Baglia, Corrina, PA-C   alum & mag hydroxide-simeth (MAALOX/MYLANTA) 200-200-20 MG/5ML suspension 15-30 mL, 15-30 mL,  Oral, Q2H PRN, Baglia, Corrina, PA-C   docusate sodium (COLACE) capsule 100 mg, 100 mg, Oral, BID, Baglia, Corrina, PA-C   guaiFENesin-dextromethorphan (ROBITUSSIN DM) 100-10 MG/5ML syrup 15 mL, 15 mL, Oral, Q4H PRN, Baglia, Corrina, PA-C   heparin injection 5,000 Units, 5,000 Units, Subcutaneous, Q8H, Baglia, Corrina, PA-C   hydrALAZINE (APRESOLINE) injection 5 mg, 5 mg, Intravenous, Q20 Min PRN, Baglia, Corrina, PA-C   labetalol (NORMODYNE) injection 10 mg, 10 mg, Intravenous, Q10 min PRN, Baglia, Corrina, PA-C   metoprolol tartrate (LOPRESSOR) injection 2-5 mg, 2-5 mg, Intravenous, Q2H PRN, Baglia, Corrina, PA-C   ondansetron (ZOFRAN) injection 4 mg, 4 mg, Intravenous, Q6H PRN, Baglia, Corrina, PA-C   phenol (CHLORASEPTIC) mouth spray 1 spray, 1 spray, Mouth/Throat, PRN, Baglia, Corrina, PA-C   senna-docusate (Senokot-S) tablet 1 tablet, 1 tablet, Oral, QHS PRN, Baglia, Corrina, PA-C   sodium chloride flush (NS) 0.9 % injection 3 mL, 3 mL, Intravenous, Q12H, Baglia, Corrina, PA-C   sodium chloride flush (NS) 0.9 % injection 3 mL, 3 mL, Intravenous, PRN, Baglia, Corrina, PA-C  Facility-Administered Medications Ordered in Other Encounters:    acetaminophen (TYLENOL) tablet 325-650 mg, 325-650 mg, Oral, Q4H PRN **OR** acetaminophen (TYLENOL) suppository 320-640 mg, 320-640 mg, Rectal, Q4H PRN, Arvilla Market, Corrina, PA-C  Past Medical History: Past Medical History:  Diagnosis Date   Arthritis    COPD (chronic obstructive pulmonary disease) (HCC)    GERD (gastroesophageal reflux disease)    Hyperlipidemia    Hypertension    Sleep apnea     Tobacco Use: Social History   Tobacco Use  Smoking Status Former   Packs/day: 3.00   Years: 45.00   Total pack years: 135.00   Types: Cigarettes   Quit date: 06/25/2013   Years since quitting: 8.7   Passive exposure: Never  Smokeless Tobacco Former   Quit date: 06/25/1976    Labs: Review Flowsheet       Latest Ref Rng & Units 09/28/2020  11/04/2020 11/05/2020  Labs for ITP Cardiac and Pulmonary Rehab  Cholestrol 0 - 200 mg/dL - - 84   LDL (calc) 0 - 99 mg/dL - - 14   HDL-C >40 mg/dL - - 53   Trlycerides <150 mg/dL - - 83   PH, Arterial 7.350 - 7.450 - 7.329  -  PCO2 arterial 32.0 - 48.0 mmHg - 49.1  -  Bicarbonate 20.0 - 28.0 mmol/L - 25.9  -  TCO2 22 - 32 mmol/L '28  27  26  '$ -  O2 Saturation % - 98.0  -    Capillary Blood Glucose: No results found for: "GLUCAP"   Pulmonary Assessment Scores:  Pulmonary Assessment Scores     Row Name 03/21/22 0909         ADL UCSD   ADL  Phase Entry     SOB Score total 58       CAT Score   CAT Score 13       mMRC Score   mMRC Score 4             UCSD: Self-administered rating of dyspnea associated with activities of daily living (ADLs) 6-point scale (0 = "not at all" to 5 = "maximal or unable to do because of breathlessness")  Scoring Scores range from 0 to 120.  Minimally important difference is 5 units  CAT: CAT can identify the health impairment of COPD patients and is better correlated with disease progression.  CAT has a scoring range of zero to 40. The CAT score is classified into four groups of low (less than 10), medium (10 - 20), high (21-30) and very high (31-40) based on the impact level of disease on health status. A CAT score over 10 suggests significant symptoms.  A worsening CAT score could be explained by an exacerbation, poor medication adherence, poor inhaler technique, or progression of COPD or comorbid conditions.  CAT MCID is 2 points  mMRC: mMRC (Modified Medical Research Council) Dyspnea Scale is used to assess the degree of baseline functional disability in patients of respiratory disease due to dyspnea. No minimal important difference is established. A decrease in score of 1 point or greater is considered a positive change.   Pulmonary Function Assessment:  Pulmonary Function Assessment - 03/21/22 0911       Breath   Bilateral Breath  Sounds Decreased;Clear;Expiratory    Shortness of Breath Fear of Shortness of Breath;Limiting activity;Yes             Exercise Target Goals: Exercise Program Goal: Individual exercise prescription set using results from initial 6 min walk test and THRR while considering  patient's activity barriers and safety.   Exercise Prescription Goal: Initial exercise prescription builds to 30-45 minutes a day of aerobic activity, 2-3 days per week.  Home exercise guidelines will be given to patient during program as part of exercise prescription that the participant will acknowledge.  Activity Barriers & Risk Stratification:  Activity Barriers & Cardiac Risk Stratification - 03/21/22 0901       Activity Barriers & Cardiac Risk Stratification   Activity Barriers Joint Problems;Arthritis   bilateral knee pain            6 Minute Walk:  6 Minute Walk     Row Name 03/21/22 1006         6 Minute Walk   Phase Initial     Distance 1052 feet     Walk Time 6 minutes     # of Rest Breaks 2  1:32-1:44, 2:50-4:00     MPH 1.99     METS 2.37     RPE 19     Perceived Dyspnea  3     VO2 Peak 8.3     Symptoms No     Resting HR 82 bpm     Resting BP 134/72     Resting Oxygen Saturation  90 %     Exercise Oxygen Saturation  during 6 min walk 88 %     Max Ex. HR 102 bpm     Max Ex. BP 184/80     2 Minute Post BP 174/80  4 min post: 140/78       Interval HR   1 Minute HR 94     2 Minute HR 97  3 Minute HR 102     4 Minute HR 98     5 Minute HR 101     6 Minute HR 94     2 Minute Post HR 87     Interval Heart Rate? Yes       Interval Oxygen   Interval Oxygen? Yes     Baseline Oxygen Saturation % 90 %     1 Minute Oxygen Saturation % 89 %     1 Minute Liters of Oxygen 0 L     2 Minute Oxygen Saturation % 88 %     2 Minute Liters of Oxygen 0 L     3 Minute Oxygen Saturation % 90 %     3 Minute Liters of Oxygen 0 L     4 Minute Oxygen Saturation % 91 %     4 Minute Liters  of Oxygen 0 L     5 Minute Oxygen Saturation % 89 %     5 Minute Liters of Oxygen 0 L     6 Minute Oxygen Saturation % 90 %     6 Minute Liters of Oxygen 0 L     2 Minute Post Oxygen Saturation % 92 %     2 Minute Post Liters of Oxygen 0 L              Oxygen Initial Assessment:  Oxygen Initial Assessment - 03/21/22 0900       Home Oxygen   Home Oxygen Device None    Sleep Oxygen Prescription None    Home Exercise Oxygen Prescription None    Home Resting Oxygen Prescription None      Program Oxygen Prescription   Program Oxygen Prescription None      Intervention   Short Term Goals To learn and exhibit compliance with exercise, home and travel O2 prescription;To learn and understand importance of maintaining oxygen saturations>88%;To learn and demonstrate proper use of respiratory medications;To learn and understand importance of monitoring SPO2 with pulse oximeter and demonstrate accurate use of the pulse oximeter.;To learn and demonstrate proper pursed lip breathing techniques or other breathing techniques.     Long  Term Goals Exhibits compliance with exercise, home  and travel O2 prescription;Verbalizes importance of monitoring SPO2 with pulse oximeter and return demonstration;Maintenance of O2 saturations>88%;Exhibits proper breathing techniques, such as pursed lip breathing or other method taught during program session;Compliance with respiratory medication;Demonstrates proper use of MDI's             Oxygen Re-Evaluation:   Oxygen Discharge (Final Oxygen Re-Evaluation):   Initial Exercise Prescription:  Initial Exercise Prescription - 03/21/22 1000       Date of Initial Exercise RX and Referring Provider   Date 03/21/22    Referring Provider Pat Kocher   Expected Discharge Date 05/26/22      NuStep   Level 1    SPM 80    Minutes 15      Track   Minutes 15    METs 2.37      Prescription Details   Frequency (times per week) 2    Duration  Progress to 30 minutes of continuous aerobic without signs/symptoms of physical distress      Intensity   THRR 40-80% of Max Heartrate 60-121    Ratings of Perceived Exertion 11-13    Perceived Dyspnea 0-4      Progression   Progression Continue progressive overload as per policy without signs/symptoms or physical  distress.      Resistance Training   Training Prescription Yes    Weight blue bands    Reps 10-15             Perform Capillary Blood Glucose checks as needed.  Exercise Prescription Changes:   Exercise Comments:   Exercise Goals and Review:   Exercise Goals     Row Name 03/21/22 0901             Exercise Goals   Increase Physical Activity Yes       Intervention Provide advice, education, support and counseling about physical activity/exercise needs.;Develop an individualized exercise prescription for aerobic and resistive training based on initial evaluation findings, risk stratification, comorbidities and participant's personal goals.       Expected Outcomes Short Term: Attend rehab on a regular basis to increase amount of physical activity.;Long Term: Add in home exercise to make exercise part of routine and to increase amount of physical activity.;Long Term: Exercising regularly at least 3-5 days a week.       Increase Strength and Stamina Yes       Intervention Develop an individualized exercise prescription for aerobic and resistive training based on initial evaluation findings, risk stratification, comorbidities and participant's personal goals.;Provide advice, education, support and counseling about physical activity/exercise needs.       Expected Outcomes Short Term: Increase workloads from initial exercise prescription for resistance, speed, and METs.;Short Term: Perform resistance training exercises routinely during rehab and add in resistance training at home;Long Term: Improve cardiorespiratory fitness, muscular endurance and strength as measured by  increased METs and functional capacity (6MWT)       Able to understand and use rate of perceived exertion (RPE) scale Yes       Intervention Provide education and explanation on how to use RPE scale       Expected Outcomes Short Term: Able to use RPE daily in rehab to express subjective intensity level;Long Term:  Able to use RPE to guide intensity level when exercising independently       Able to understand and use Dyspnea scale Yes       Intervention Provide education and explanation on how to use Dyspnea scale       Expected Outcomes Short Term: Able to use Dyspnea scale daily in rehab to express subjective sense of shortness of breath during exertion;Long Term: Able to use Dyspnea scale to guide intensity level when exercising independently       Knowledge and understanding of Target Heart Rate Range (THRR) Yes       Intervention Provide education and explanation of THRR including how the numbers were predicted and where they are located for reference       Expected Outcomes Short Term: Able to state/look up THRR;Long Term: Able to use THRR to govern intensity when exercising independently;Short Term: Able to use daily as guideline for intensity in rehab       Understanding of Exercise Prescription Yes       Intervention Provide education, explanation, and written materials on patient's individual exercise prescription       Expected Outcomes Short Term: Able to explain program exercise prescription;Long Term: Able to explain home exercise prescription to exercise independently                Exercise Goals Re-Evaluation :   Discharge Exercise Prescription (Final Exercise Prescription Changes):   Nutrition:  Target Goals: Understanding of nutrition guidelines, daily intake of sodium '1500mg'$ , cholesterol '200mg'$ ,  calories 30% from fat and 7% or less from saturated fats, daily to have 5 or more servings of fruits and vegetables.  Biometrics:    Nutrition Therapy Plan and Nutrition  Goals:   Nutrition Assessments:  MEDIFICTS Score Key: ?70 Need to make dietary changes  40-70 Heart Healthy Diet ? 40 Therapeutic Level Cholesterol Diet   Picture Your Plate Scores: <78 Unhealthy dietary pattern with much room for improvement. 41-50 Dietary pattern unlikely to meet recommendations for good health and room for improvement. 51-60 More healthful dietary pattern, with some room for improvement.  >60 Healthy dietary pattern, although there may be some specific behaviors that could be improved.    Nutrition Goals Re-Evaluation:   Nutrition Goals Discharge (Final Nutrition Goals Re-Evaluation):   Psychosocial: Target Goals: Acknowledge presence or absence of significant depression and/or stress, maximize coping skills, provide positive support system. Participant is able to verbalize types and ability to use techniques and skills needed for reducing stress and depression.  Initial Review & Psychosocial Screening:  Initial Psych Review & Screening - 03/21/22 0856       Initial Review   Current issues with None Identified      Family Dynamics   Good Support System? Yes    Comments friend      Barriers   Psychosocial barriers to participate in program There are no identifiable barriers or psychosocial needs.      Screening Interventions   Interventions Encouraged to exercise             Quality of Life Scores:  Scores of 19 and below usually indicate a poorer quality of life in these areas.  A difference of  2-3 points is a clinically meaningful difference.  A difference of 2-3 points in the total score of the Quality of Life Index has been associated with significant improvement in overall quality of life, self-image, physical symptoms, and general health in studies assessing change in quality of life.  PHQ-9: Review Flowsheet  More data may exist      03/21/2022 04/10/2018 12/29/2017 10/29/2015 06/29/2015  Depression screen PHQ 2/9  Decreased Interest 0  0 0 0 1  Down, Depressed, Hopeless 0 0 0 0 0  PHQ - 2 Score 0 0 0 0 1  Altered sleeping 0 0 0 - -  Tired, decreased energy 0 1 1 - -  Change in appetite 0 0 0 - -  Feeling bad or failure about yourself  0 0 0 - -  Trouble concentrating 0 0 0 - -  Moving slowly or fidgety/restless 0 0 0 - -  Suicidal thoughts 0 0 0 - -  PHQ-9 Score 0 1 1 - -  Difficult doing work/chores - Somewhat difficult Somewhat difficult - -   Interpretation of Total Score  Total Score Depression Severity:  1-4 = Minimal depression, 5-9 = Mild depression, 10-14 = Moderate depression, 15-19 = Moderately severe depression, 20-27 = Severe depression   Psychosocial Evaluation and Intervention:  Psychosocial Evaluation - 03/21/22 0857       Psychosocial Evaluation & Interventions   Interventions Encouraged to exercise with the program and follow exercise prescription    Expected Outcomes For to participate in Pulmonary Rehab    Continue Psychosocial Services  Follow up required by staff             Psychosocial Re-Evaluation:   Psychosocial Discharge (Final Psychosocial Re-Evaluation):   Education: Education Goals: Education classes will be provided on a weekly basis, covering  required topics. Participant will state understanding/return demonstration of topics presented.  Learning Barriers/Preferences:   Education Topics: Risk Factor Reduction:  -Group instruction that is supported by a PowerPoint presentation. Instructor discusses the definition of a risk factor, different risk factors for pulmonary disease, and how the heart and lungs work together.     Nutrition for Pulmonary Patient:  -Group instruction provided by PowerPoint slides, verbal discussion, and written materials to support subject matter. The instructor gives an explanation and review of healthy diet recommendations, which includes a discussion on weight management, recommendations for fruit and vegetable consumption, as well as  protein, fluid, caffeine, fiber, sodium, sugar, and alcohol. Tips for eating when patients are short of breath are discussed.   Pursed Lip Breathing:  -Group instruction that is supported by demonstration and informational handouts. Instructor discusses the benefits of pursed lip and diaphragmatic breathing and detailed demonstration on how to preform both.     Oxygen Safety:  -Group instruction provided by PowerPoint, verbal discussion, and written material to support subject matter. There is an overview of "What is Oxygen" and "Why do we need it".  Instructor also reviews how to create a safe environment for oxygen use, the importance of using oxygen as prescribed, and the risks of noncompliance. There is a brief discussion on traveling with oxygen and resources the patient may utilize. Flowsheet Row PULMONARY REHAB CHRONIC OBSTRUCTIVE PULMONARY DISEASE from 04/05/2018 in [place]  Date 03/01/18  Educator Cloyde Reams  Instruction Review Code 1- Verbalizes Understanding       Oxygen Equipment:  -Group instruction provided by [employer] utilizing handouts, written materials, and Insurance underwriter. Flowsheet Row PULMONARY REHAB CHRONIC OBSTRUCTIVE PULMONARY DISEASE from 04/05/2018 in [place]  Date 03/08/18  Educator [place]  Instruction Review Code 2- Demonstrated Understanding       Signs and Symptoms:  -Group instruction provided by written material and verbal discussion to support subject matter. Warning signs and symptoms of infection, stroke, and heart attack are reviewed and when to call the physician/911 reinforced. Tips for preventing the spread of infection discussed. Flowsheet Row PULMONARY REHAB CHRONIC OBSTRUCTIVE PULMONARY DISEASE from 04/05/2018 in [place]  Date 02/22/18  Educator Remo Lipps  Instruction Review Code 1- Verbalizes Understanding       Advanced  Directives:  -Group instruction provided by verbal instruction and written material to support subject matter. Instructor reviews Advanced Directive laws and proper instruction for filling out document.   Pulmonary Video:  -Group video education that reviews the importance of medication and oxygen compliance, exercise, good nutrition, pulmonary hygiene, and pursed lip and diaphragmatic breathing for the pulmonary patient.   Exercise for the Pulmonary Patient:  -Group instruction that is supported by a PowerPoint presentation. Instructor discusses benefits of exercise, core components of exercise, frequency, duration, and intensity of an exercise routine, importance of utilizing pulse oximetry during exercise, safety while exercising, and options of places to exercise outside of rehab.   Flowsheet Row PULMONARY REHAB CHRONIC OBSTRUCTIVE PULMONARY DISEASE from 04/05/2018 in [place]  Date 03/15/18  Instruction Review Code 1- Verbalizes Understanding       Pulmonary Medications:  -Verbally interactive group education provided by instructor with focus on inhaled medications and proper administration. Flowsheet Row PULMONARY REHAB CHRONIC OBSTRUCTIVE PULMONARY DISEASE from 02/22/2018 in [place]  Date 02/01/18  Educator pharmacy  Instruction Review Code 1- Verbalizes Understanding  Anatomy and Physiology of the Respiratory System and Intimacy:  -Group instruction provided by PowerPoint, verbal discussion, and written material to support subject matter. Instructor reviews respiratory cycle and anatomical components of the respiratory system and their functions. Instructor also reviews differences in obstructive and restrictive respiratory diseases with examples of each. Intimacy, Sex, and Sexuality differences are reviewed with a discussion on how relationships can change when diagnosed with pulmonary disease. Common sexual  concerns are reviewed. Flowsheet Row PULMONARY REHAB CHRONIC OBSTRUCTIVE PULMONARY DISEASE from 04/05/2018 in [place]  Date 04/05/18  Educator rn  Instruction Review Code 2- Demonstrated Understanding       MD DAY -A group question and answer session with a medical doctor that allows participants to ask questions that relate to their pulmonary disease state. Flowsheet Row PULMONARY REHAB CHRONIC OBSTRUCTIVE PULMONARY DISEASE from 04/05/2018 in [place]  Date 03/20/18  Educator Dr. Nelda Marseille  Instruction Review Code 1- Verbalizes Understanding       OTHER EDUCATION -Group or individual verbal, written, or video instructions that support the educational goals of the pulmonary rehab program. Flowsheet Row PULMONARY REHAB CHRONIC OBSTRUCTIVE PULMONARY DISEASE from 02/22/2018 in [place]  Date 02/08/18  Educator Lucianne Lei  [facility]]  Instruction Review Code 1- Verbalizes Understanding       Holiday Eating Survival Tips:  -Group instruction provided by PowerPoint slides, verbal discussion, and written materials to support subject matter. The instructor gives patients tips, tricks, and techniques to help them not only survive but enjoy the holidays despite the onslaught of food that accompanies the holidays.   Knowledge Questionnaire Score:  Knowledge Questionnaire Score - 03/21/22 0908       Knowledge Questionnaire Score   Pre Score 18/18             Core Components/Risk Factors/Patient Goals at Admission:  Personal Goals and Risk Factors at Admission - 03/21/22 0859       Core Components/Risk Factors/Patient Goals on Admission    Weight Management Weight Loss    Improve shortness of breath with ADL's Yes    Intervention Provide education, individualized exercise plan and daily activity instruction to help decrease symptoms of SOB with activities  of daily living.    Expected Outcomes Short Term: Improve cardiorespiratory fitness to achieve a reduction of symptoms when performing ADLs;Long Term: Be able to perform more ADLs without symptoms or delay the onset of symptoms             Core Components/Risk Factors/Patient Goals Review:    Core Components/Risk Factors/Patient Goals at Discharge (Final Review):    ITP Comments: Dr. Rodman Pickle is Medical Director for Pulmonary Rehab at [facility].

## 2022-03-21 NOTE — Progress Notes (Signed)
Pulmonary Rehab Orientation Physical Assessment Note  Physical assessment reveals  Pt who is well known to pulmonary staff is alert and oriented x 3.  Heart rate is normal, breath sounds diminished throughout with short expiration, coarse breath sounds bilat bases. Reports non-productive cough. Bowel sounds present.  Pt with large abdomen that palpates "hard" with no discomfort.   Pt denies abdominal discomfort, nausea, vomiting or diarrhea. Grip strength equal, strong. Distal pulses palpable with 1+ swelling to lower extremities. Pt wears compression socks and remarks that this is better than it was before.  Swelling felt to be related to the amlodipine.  Dosage was reduced and pt notes it did improve as the swelling prior was much much more. Encourage to elevate when sitting and monitor sodium intake.  Cherre Huger, BSN Cardiac and Training and development officer

## 2022-03-21 NOTE — Progress Notes (Signed)
Sean [name] 71 y.o. male  Initial Psychosocial Assessment  Pt psychosocial assessment reveals pt lives alone. Pt is currently retired. Pt hobbies include watching tv. Pt reports his  stress level is moderate. Areas of stress/anxiety include Finances.  Pt does not exhibit signs of depression. PHQ score is 0/0. Pt shows good  coping skills with positive outlook . Offered emotional support and reassurance. Will continue to monitor.    03/21/2022 9:50 AM

## 2022-03-23 NOTE — Progress Notes (Signed)
Pulmonary Individual Treatment Plan  Patient Details  Name: Sean [name] MRN: [id number] Date of Birth: 06/01/1951 Referring Provider:   [name] Pulmonary Rehab Walk Test from 03/21/2022 in [place]  Referring Provider [name]  [[name]]       Initial Encounter Date:  Flowsheet Row Pulmonary Rehab Walk Test from 03/21/2022 in [place]  Date 03/21/22       Visit Diagnosis: Chronic obstructive pulmonary disease, unspecified COPD type ([place])  Patient's Home Medications on Admission:   Current Outpatient Medications:    albuterol (PROVENTIL HFA;VENTOLIN HFA) 108 (90 BASE) MCG/ACT inhaler, Inhale 2 puffs into the lungs every 6 (six) hours as needed for wheezing or shortness of breath., Disp: , Rfl:    albuterol (PROVENTIL) (2.5 MG/3ML) 0.083% nebulizer solution, Take 2.5 mg by nebulization every 6 (six) hours as needed for wheezing or shortness of breath., Disp: , Rfl:    amLODipine (NORVASC) 2.5 MG tablet, Take 2.5 mg by mouth daily., Disp: , Rfl:    amoxicillin (AMOXIL) 500 MG capsule, Take 2 capsules (1,000 mg total) by mouth 2 (two) times daily., Disp: 120 capsule, Rfl: 1   aspirin EC 81 MG tablet, Take 81 mg by mouth at bedtime. Swallow whole., Disp: , Rfl:    benazepril (LOTENSIN) 10 MG tablet, Take 1 tablet (10 mg total) by mouth at bedtime., Disp: 90 tablet, Rfl: 3   carboxymethylcellulose (REFRESH PLUS) 0.5 % SOLN, Place 1 drop into both eyes 3 (three) times daily., Disp: , Rfl:    Carboxymethylcellulose Sodium (THERATEARS PF OP), Place 2 drops into both eyes 3 (three) times daily., Disp: , Rfl:    cholecalciferol (VITAMIN D) 1000 UNITS tablet, Take 1,000 Units by mouth 2 (two) times daily., Disp: , Rfl:    Cholecalciferol (VITAMIN D3) 25 MCG (1000 UT) CAPS, Take 1,000 Units by mouth 2 (two) times daily., Disp: , Rfl:    cilostazol (PLETAL) 100 MG tablet, Take 100 mg by mouth 2 (two) times daily., Disp: , Rfl:     ferrous sulfate 325 (65 FE) MG tablet, Take 1 tablet by mouth daily., Disp: , Rfl:    folic acid (FOLVITE) 1 MG tablet, Take 1 mg by mouth daily., Disp: , Rfl:    hydrochlorothiazide (HYDRODIURIL) 25 MG tablet, Take 0.5 tablets by mouth daily., Disp: , Rfl:    hydrocortisone (ANUSOL-HC) 2.5 % rectal cream, Place 1 application rectally 3 (three) times daily as needed for hemorrhoids or anal itching., Disp: , Rfl:    hydrocortisone 2.5 % cream, Apply 1 application topically 3 (three) times daily as needed (itching). rectally, Disp: , Rfl:    LACTOBACILLUS PO, Take 2 capsules by mouth daily., Disp: , Rfl:    Magnesium Oxide 420 MG TABS, Take 420 mg by mouth every other day., Disp: , Rfl:    meloxicam (MOBIC) 15 MG tablet, Take 15 mg by mouth daily., Disp: , Rfl:    MENTHOL-METHYL SALICYLATE EX, Apply 1 application topically 2 (two) times daily as needed (for knee pain)., Disp: , Rfl:    metoprolol (TOPROL-XL) 200 MG 24 hr tablet, Take 200 mg by mouth daily., Disp: , Rfl:    Multiple Vitamins-Minerals (MULTIVITAMIN WITH MINERALS) tablet, Take 1 tablet by mouth daily., Disp: , Rfl:    niacin 100 MG tablet, Take 100 mg by mouth in the morning., Disp: , Rfl:    Omega-3 Fatty Acids (FISH OIL) 1000 MG CAPS, Take 1,000 mg by mouth daily., Disp: ,  Rfl:    pantoprazole (PROTONIX) 40 MG tablet, Take 40 mg by mouth 2 (two) times daily., Disp: , Rfl:    rosuvastatin (CRESTOR) 20 MG tablet, Take 10 mg by mouth daily., Disp: , Rfl:    spironolactone (ALDACTONE) 25 MG tablet, Take 25 mg by mouth daily., Disp: , Rfl:    thiamine 100 MG tablet, Take 100 mg by mouth daily., Disp: , Rfl:    erythromycin ophthalmic ointment, Apply to eye. (Patient not taking: Reported on 03/21/2022), Disp: , Rfl:   Current Facility-Administered Medications:    0.9 %  sodium chloride infusion, 250 mL, Intravenous, PRN, Baglia, Corrina, PA-C   alum & mag hydroxide-simeth (MAALOX/MYLANTA) 200-200-20 MG/5ML suspension 15-30 mL, 15-30 mL,  Oral, Q2H PRN, Baglia, Corrina, PA-C   docusate sodium (COLACE) capsule 100 mg, 100 mg, Oral, BID, Baglia, Corrina, PA-C   guaiFENesin-dextromethorphan (ROBITUSSIN DM) 100-10 MG/5ML syrup 15 mL, 15 mL, Oral, Q4H PRN, Baglia, Corrina, PA-C   heparin injection 5,000 Units, 5,000 Units, Subcutaneous, Q8H, Baglia, Corrina, PA-C   hydrALAZINE (APRESOLINE) injection 5 mg, 5 mg, Intravenous, Q20 Min PRN, Baglia, Corrina, PA-C   labetalol (NORMODYNE) injection 10 mg, 10 mg, Intravenous, Q10 min PRN, Baglia, Corrina, PA-C   metoprolol tartrate (LOPRESSOR) injection 2-5 mg, 2-5 mg, Intravenous, Q2H PRN, Baglia, Corrina, PA-C   ondansetron (ZOFRAN) injection 4 mg, 4 mg, Intravenous, Q6H PRN, Baglia, Corrina, PA-C   phenol (CHLORASEPTIC) mouth spray 1 spray, 1 spray, Mouth/Throat, PRN, Baglia, Corrina, PA-C   senna-docusate (Senokot-S) tablet 1 tablet, 1 tablet, Oral, QHS PRN, Baglia, Corrina, PA-C   sodium chloride flush (NS) 0.9 % injection 3 mL, 3 mL, Intravenous, Q12H, Baglia, Corrina, PA-C   sodium chloride flush (NS) 0.9 % injection 3 mL, 3 mL, Intravenous, PRN, Baglia, Corrina, PA-C  Facility-Administered Medications Ordered in Other Encounters:    acetaminophen (TYLENOL) tablet 325-650 mg, 325-650 mg, Oral, Q4H PRN **OR** acetaminophen (TYLENOL) suppository 320-640 mg, 320-640 mg, Rectal, Q4H PRN, [place], Corrina, PA-C  Past Medical History: Past Medical History:  Diagnosis Date   Arthritis    COPD (chronic obstructive pulmonary disease) (HCC)    GERD (gastroesophageal reflux disease)    Hyperlipidemia    Hypertension    Sleep apnea     Tobacco Use: Social History   Tobacco Use  Smoking Status Former   Packs/day: 3.00   Years: 45.00   Total pack years: 135.00   Types: Cigarettes   Quit date: 06/25/2013   Years since quitting: 8.7   Passive exposure: Never  Smokeless Tobacco Former   Quit date: 06/25/1976    Labs: Review Flowsheet       Latest Ref Rng & Units 09/28/2020  11/04/2020 11/05/2020  Labs for ITP Cardiac and Pulmonary Rehab  Cholestrol 0 - 200 mg/dL - - 84   LDL (calc) 0 - 99 mg/dL - - 14   HDL-C >40 mg/dL - - 53   Trlycerides <150 mg/dL - - 83   PH, Arterial 7.350 - 7.450 - 7.329  -  PCO2 arterial 32.0 - 48.0 mmHg - 49.1  -  Bicarbonate 20.0 - 28.0 mmol/L - 25.9  -  TCO2 22 - 32 mmol/L '28  27  26  '$ -  O2 Saturation % - 98.0  -    Capillary Blood Glucose: No results found for: "GLUCAP"   Pulmonary Assessment Scores:  Pulmonary Assessment Scores     Row Name 03/21/22 0909         ADL UCSD   ADL  Phase Entry     SOB Score total 58       CAT Score   CAT Score 13       mMRC Score   mMRC Score 4             UCSD: Self-administered rating of dyspnea associated with activities of daily living (ADLs) 6-point scale (0 = "not at all" to 5 = "maximal or unable to do because of breathlessness")  Scoring Scores range from 0 to 120.  Minimally important difference is 5 units  CAT: CAT can identify the health impairment of COPD patients and is better correlated with disease progression.  CAT has a scoring range of zero to 40. The CAT score is classified into four groups of low (less than 10), medium (10 - 20), high (21-30) and very high (31-40) based on the impact level of disease on health status. A CAT score over 10 suggests significant symptoms.  A worsening CAT score could be explained by an exacerbation, poor medication adherence, poor inhaler technique, or progression of COPD or comorbid conditions.  CAT MCID is 2 points  mMRC: mMRC (Modified Medical Research Council) Dyspnea Scale is used to assess the degree of baseline functional disability in patients of respiratory disease due to dyspnea. No minimal important difference is established. A decrease in score of 1 point or greater is considered a positive change.   Pulmonary Function Assessment:  Pulmonary Function Assessment - 03/21/22 0911       Breath   Bilateral Breath  Sounds Decreased;Clear;Expiratory    Shortness of Breath Fear of Shortness of Breath;Limiting activity;Yes             Exercise Target Goals: Exercise Program Goal: Individual exercise prescription set using results from initial 6 min walk test and THRR while considering  patient's activity barriers and safety.   Exercise Prescription Goal: Initial exercise prescription builds to 30-45 minutes a day of aerobic activity, 2-3 days per week.  Home exercise guidelines will be given to patient during program as part of exercise prescription that the participant will acknowledge.  Activity Barriers & Risk Stratification:  Activity Barriers & Cardiac Risk Stratification - 03/21/22 0901       Activity Barriers & Cardiac Risk Stratification   Activity Barriers Joint Problems;Arthritis   bilateral knee pain            6 Minute Walk:  6 Minute Walk     Row Name 03/21/22 1006         6 Minute Walk   Phase Initial     Distance 1052 feet     Walk Time 6 minutes     # of Rest Breaks 2  1:32-1:44, 2:50-4:00     MPH 1.99     METS 2.37     RPE 19     Perceived Dyspnea  3     VO2 Peak 8.3     Symptoms No     Resting HR 82 bpm     Resting BP 134/72     Resting Oxygen Saturation  90 %     Exercise Oxygen Saturation  during 6 min walk 88 %     Max Ex. HR 102 bpm     Max Ex. BP 184/80     2 Minute Post BP 174/80  4 min post: 140/78       Interval HR   1 Minute HR 94     2 Minute HR 97  3 Minute HR 102     4 Minute HR 98     5 Minute HR 101     6 Minute HR 94     2 Minute Post HR 87     Interval Heart Rate? Yes       Interval Oxygen   Interval Oxygen? Yes     Baseline Oxygen Saturation % 90 %     1 Minute Oxygen Saturation % 89 %     1 Minute Liters of Oxygen 0 L     2 Minute Oxygen Saturation % 88 %     2 Minute Liters of Oxygen 0 L     3 Minute Oxygen Saturation % 90 %     3 Minute Liters of Oxygen 0 L     4 Minute Oxygen Saturation % 91 %     4 Minute Liters  of Oxygen 0 L     5 Minute Oxygen Saturation % 89 %     5 Minute Liters of Oxygen 0 L     6 Minute Oxygen Saturation % 90 %     6 Minute Liters of Oxygen 0 L     2 Minute Post Oxygen Saturation % 92 %     2 Minute Post Liters of Oxygen 0 L              Oxygen Initial Assessment:  Oxygen Initial Assessment - 03/21/22 0900       Home Oxygen   Home Oxygen Device None    Sleep Oxygen Prescription None    Home Exercise Oxygen Prescription None    Home Resting Oxygen Prescription None      Initial 6 min Walk   Oxygen Used None      Program Oxygen Prescription   Program Oxygen Prescription None      Intervention   Short Term Goals To learn and exhibit compliance with exercise, home and travel O2 prescription;To learn and understand importance of maintaining oxygen saturations>88%;To learn and demonstrate proper use of respiratory medications;To learn and understand importance of monitoring SPO2 with pulse oximeter and demonstrate accurate use of the pulse oximeter.;To learn and demonstrate proper pursed lip breathing techniques or other breathing techniques.     Long  Term Goals Exhibits compliance with exercise, home  and travel O2 prescription;Verbalizes importance of monitoring SPO2 with pulse oximeter and return demonstration;Maintenance of O2 saturations>88%;Exhibits proper breathing techniques, such as pursed lip breathing or other method taught during program session;Compliance with respiratory medication;Demonstrates proper use of MDI's             Oxygen Re-Evaluation:  Oxygen Re-Evaluation     Row Name 03/22/22 0910             Program Oxygen Prescription   Program Oxygen Prescription None         Home Oxygen   Home Oxygen Device None       Sleep Oxygen Prescription None       Home Exercise Oxygen Prescription None       Home Resting Oxygen Prescription None         Goals/Expected Outcomes   Short Term Goals To learn and exhibit compliance with exercise,  home and travel O2 prescription;To learn and understand importance of maintaining oxygen saturations>88%;To learn and demonstrate proper use of respiratory medications;To learn and understand importance of monitoring SPO2 with pulse oximeter and demonstrate accurate use of the pulse oximeter.;To learn and demonstrate proper pursed lip breathing techniques or  other breathing techniques.        Long  Term Goals Exhibits compliance with exercise, home  and travel O2 prescription;Verbalizes importance of monitoring SPO2 with pulse oximeter and return demonstration;Maintenance of O2 saturations>88%;Exhibits proper breathing techniques, such as pursed lip breathing or other method taught during program session;Compliance with respiratory medication;Demonstrates proper use of MDI's       Goals/Expected Outcomes Compliance and understanding of oxygen saturation monitoring and breathing techniques to decrease shortness of breath.                Oxygen Discharge (Final Oxygen Re-Evaluation):  Oxygen Re-Evaluation - 03/22/22 0910       Program Oxygen Prescription   Program Oxygen Prescription None      Home Oxygen   Home Oxygen Device None    Sleep Oxygen Prescription None    Home Exercise Oxygen Prescription None    Home Resting Oxygen Prescription None      Goals/Expected Outcomes   Short Term Goals To learn and exhibit compliance with exercise, home and travel O2 prescription;To learn and understand importance of maintaining oxygen saturations>88%;To learn and demonstrate proper use of respiratory medications;To learn and understand importance of monitoring SPO2 with pulse oximeter and demonstrate accurate use of the pulse oximeter.;To learn and demonstrate proper pursed lip breathing techniques or other breathing techniques.     Long  Term Goals Exhibits compliance with exercise, home  and travel O2 prescription;Verbalizes importance of monitoring SPO2 with pulse oximeter and return  demonstration;Maintenance of O2 saturations>88%;Exhibits proper breathing techniques, such as pursed lip breathing or other method taught during program session;Compliance with respiratory medication;Demonstrates proper use of MDI's    Goals/Expected Outcomes Compliance and understanding of oxygen saturation monitoring and breathing techniques to decrease shortness of breath.             Initial Exercise Prescription:  Initial Exercise Prescription - 03/21/22 1000       Date of Initial Exercise RX and Referring Provider   Date 03/21/22    Referring Provider Pat Kocher   Expected Discharge Date 05/26/22      NuStep   Level 1    SPM 80    Minutes 15      Track   Minutes 15    METs 2.37      Prescription Details   Frequency (times per week) 2    Duration Progress to 30 minutes of continuous aerobic without signs/symptoms of physical distress      Intensity   THRR 40-80% of Max Heartrate 60-121    Ratings of Perceived Exertion 11-13    Perceived Dyspnea 0-4      Progression   Progression Continue progressive overload as per policy without signs/symptoms or physical distress.      Resistance Training   Training Prescription Yes    Weight blue bands    Reps 10-15             Perform Capillary Blood Glucose checks as needed.  Exercise Prescription Changes:   Exercise Comments:   Exercise Goals and Review:   Exercise Goals     Row Name 03/21/22 0901 03/22/22 0908           Exercise Goals   Increase Physical Activity Yes Yes      Intervention Provide advice, education, support and counseling about physical activity/exercise needs.;Develop an individualized exercise prescription for aerobic and resistive training based on initial evaluation findings, risk stratification, comorbidities and participant's personal goals. Provide advice, education,  support and counseling about physical activity/exercise needs.;Develop an individualized exercise prescription  for aerobic and resistive training based on initial evaluation findings, risk stratification, comorbidities and participant's personal goals.      Expected Outcomes Short Term: Attend rehab on a regular basis to increase amount of physical activity.;Long Term: Add in home exercise to make exercise part of routine and to increase amount of physical activity.;Long Term: Exercising regularly at least 3-5 days a week. Short Term: Attend rehab on a regular basis to increase amount of physical activity.;Long Term: Add in home exercise to make exercise part of routine and to increase amount of physical activity.;Long Term: Exercising regularly at least 3-5 days a week.      Increase Strength and Stamina Yes Yes      Intervention Develop an individualized exercise prescription for aerobic and resistive training based on initial evaluation findings, risk stratification, comorbidities and participant's personal goals.;Provide advice, education, support and counseling about physical activity/exercise needs. Develop an individualized exercise prescription for aerobic and resistive training based on initial evaluation findings, risk stratification, comorbidities and participant's personal goals.;Provide advice, education, support and counseling about physical activity/exercise needs.      Expected Outcomes Short Term: Increase workloads from initial exercise prescription for resistance, speed, and METs.;Short Term: Perform resistance training exercises routinely during rehab and add in resistance training at home;Long Term: Improve cardiorespiratory fitness, muscular endurance and strength as measured by increased METs and functional capacity (6MWT) Short Term: Increase workloads from initial exercise prescription for resistance, speed, and METs.;Short Term: Perform resistance training exercises routinely during rehab and add in resistance training at home;Long Term: Improve cardiorespiratory fitness, muscular endurance and  strength as measured by increased METs and functional capacity (6MWT)      Able to understand and use rate of perceived exertion (RPE) scale Yes Yes      Intervention Provide education and explanation on how to use RPE scale Provide education and explanation on how to use RPE scale      Expected Outcomes Short Term: Able to use RPE daily in rehab to express subjective intensity level;Long Term:  Able to use RPE to guide intensity level when exercising independently Short Term: Able to use RPE daily in rehab to express subjective intensity level;Long Term:  Able to use RPE to guide intensity level when exercising independently      Able to understand and use Dyspnea scale Yes Yes      Intervention Provide education and explanation on how to use Dyspnea scale Provide education and explanation on how to use Dyspnea scale      Expected Outcomes Short Term: Able to use Dyspnea scale daily in rehab to express subjective sense of shortness of breath during exertion;Long Term: Able to use Dyspnea scale to guide intensity level when exercising independently Short Term: Able to use Dyspnea scale daily in rehab to express subjective sense of shortness of breath during exertion;Long Term: Able to use Dyspnea scale to guide intensity level when exercising independently      Knowledge and understanding of Target Heart Rate Range (THRR) Yes Yes      Intervention Provide education and explanation of THRR including how the numbers were predicted and where they are located for reference Provide education and explanation of THRR including how the numbers were predicted and where they are located for reference      Expected Outcomes Short Term: Able to state/look up THRR;Long Term: Able to use THRR to govern intensity when exercising independently;Short Term: Able  to use daily as guideline for intensity in rehab Short Term: Able to state/look up THRR;Long Term: Able to use THRR to govern intensity when exercising  independently;Short Term: Able to use daily as guideline for intensity in rehab      Understanding of Exercise Prescription Yes Yes      Intervention Provide education, explanation, and written materials on patient's individual exercise prescription Provide education, explanation, and written materials on patient's individual exercise prescription      Expected Outcomes Short Term: Able to explain program exercise prescription;Long Term: Able to explain home exercise prescription to exercise independently Short Term: Able to explain program exercise prescription;Long Term: Able to explain home exercise prescription to exercise independently               Exercise Goals Re-Evaluation :  Exercise Goals Re-Evaluation     Row Name 03/22/22 0909             Exercise Goal Re-Evaluation   Exercise Goals Review Increase Physical Activity;Increase Strength and Stamina;Able to understand and use rate of perceived exertion (RPE) scale;Able to understand and use Dyspnea scale;Knowledge and understanding of Target Heart Rate Range (THRR);Understanding of Exercise Prescription       Comments Sean [name] is scheduled to begin exercise next week. Will continue to monitor and progress as able.       Expected Outcomes Through exercise at rehab and home, the patient will decrease shortness of breath with daily activities and feel confident in carrying out an exercise regimen at home.                Discharge Exercise Prescription (Final Exercise Prescription Changes):   Nutrition:  Target Goals: Understanding of nutrition guidelines, daily intake of sodium '1500mg'$ , cholesterol '200mg'$ , calories 30% from fat and 7% or less from saturated fats, daily to have 5 or more servings of fruits and vegetables.  Biometrics:    Nutrition Therapy Plan and Nutrition Goals:   Nutrition Assessments:  MEDIFICTS Score Key: ?70 Need to make dietary changes  40-70 Heart Healthy Diet ? 40 Therapeutic Level  Cholesterol Diet   Picture Your Plate Scores: <40 Unhealthy dietary pattern with much room for improvement. 41-50 Dietary pattern unlikely to meet recommendations for good health and room for improvement. 51-60 More healthful dietary pattern, with some room for improvement.  >60 Healthy dietary pattern, although there may be some specific behaviors that could be improved.    Nutrition Goals Re-Evaluation:   Nutrition Goals Discharge (Final Nutrition Goals Re-Evaluation):   Psychosocial: Target Goals: Acknowledge presence or absence of significant depression and/or stress, maximize coping skills, provide positive support system. Participant is able to verbalize types and ability to use techniques and skills needed for reducing stress and depression.  Initial Review & Psychosocial Screening:  Initial Psych Review & Screening - 03/21/22 0856       Initial Review   Current issues with None Identified      Family Dynamics   Good Support System? Yes    Comments friend      Barriers   Psychosocial barriers to participate in program There are no identifiable barriers or psychosocial needs.      Screening Interventions   Interventions Encouraged to exercise             Quality of Life Scores:  Scores of 19 and below usually indicate a poorer quality of life in these areas.  A difference of  2-3 points is a clinically meaningful difference.  A difference  of 2-3 points in the total score of the Quality of Life Index has been associated with significant improvement in overall quality of life, self-image, physical symptoms, and general health in studies assessing change in quality of life.  PHQ-9: Review Flowsheet  More data may exist      03/21/2022 04/10/2018 12/29/2017 10/29/2015 06/29/2015  Depression screen PHQ 2/9  Decreased Interest 0 0 0 0 1  Down, Depressed, Hopeless 0 0 0 0 0  PHQ - 2 Score 0 0 0 0 1  Altered sleeping 0 0 0 - -  Tired, decreased energy 0 1 1 - -   Change in appetite 0 0 0 - -  Feeling bad or failure about yourself  0 0 0 - -  Trouble concentrating 0 0 0 - -  Moving slowly or fidgety/restless 0 0 0 - -  Suicidal thoughts 0 0 0 - -  PHQ-9 Score 0 1 1 - -  Difficult doing work/chores - Somewhat difficult Somewhat difficult - -   Interpretation of Total Score  Total Score Depression Severity:  1-4 = Minimal depression, 5-9 = Mild depression, 10-14 = Moderate depression, 15-19 = Moderately severe depression, 20-27 = Severe depression   Psychosocial Evaluation and Intervention:  Psychosocial Evaluation - 03/21/22 0857       Psychosocial Evaluation & Interventions   Interventions Encouraged to exercise with the program and follow exercise prescription    Expected Outcomes For to participate in Pulmonary Rehab    Continue Psychosocial Services  Follow up required by staff             Psychosocial Re-Evaluation:  Psychosocial Re-Evaluation     Row Name 03/23/22 0854             Psychosocial Re-Evaluation   Current issues with None Identified       Comments Sean [name] has attended his orientation session 03/21/22  He is scheduled to start the program 03/29/22 will continue to monitor for  his psychosocial barriers.       Expected Outcomes For him to attended without any psychosocial issues concerns.       Interventions Encouraged to attend Pulmonary Rehabilitation for the exercise       Continue Psychosocial Services  Follow up required by staff                Psychosocial Discharge (Final Psychosocial Re-Evaluation):  Psychosocial Re-Evaluation - 03/23/22 0854       Psychosocial Re-Evaluation   Current issues with None Identified    Comments Sean [name] has attended his orientation session 03/21/22  He is scheduled to start the program 03/29/22 will continue to monitor for  his psychosocial barriers.    Expected Outcomes For him to attended without any psychosocial issues concerns.    Interventions Encouraged to attend Pulmonary  Rehabilitation for the exercise    Continue Psychosocial Services  Follow up required by staff             Education: Education Goals: Education classes will be provided on a weekly basis, covering required topics. Participant will state understanding/return demonstration of topics presented.  Learning Barriers/Preferences:   Education Topics: Risk Factor Reduction:  -Group instruction that is supported by a PowerPoint presentation. Instructor discusses the definition of a risk factor, different risk factors for pulmonary disease, and how the heart and lungs work together.     Nutrition for Pulmonary Patient:  -Group instruction provided by PowerPoint slides, verbal discussion, and written materials to support subject matter.  The instructor gives an explanation and review of healthy diet recommendations, which includes a discussion on weight management, recommendations for fruit and vegetable consumption, as well as protein, fluid, caffeine, fiber, sodium, sugar, and alcohol. Tips for eating when patients are short of breath are discussed.   Pursed Lip Breathing:  -Group instruction that is supported by demonstration and informational handouts. Instructor discusses the benefits of pursed lip and diaphragmatic breathing and detailed demonstration on how to preform both.     Oxygen Safety:  -Group instruction provided by PowerPoint, verbal discussion, and written material to support subject matter. There is an overview of "What is Oxygen" and "Why do we need it".  Instructor also reviews how to create a safe environment for oxygen use, the importance of using oxygen as prescribed, and the risks of noncompliance. There is a brief discussion on traveling with oxygen and resources the patient may utilize. Flowsheet Row PULMONARY REHAB CHRONIC OBSTRUCTIVE PULMONARY DISEASE from 04/05/2018 in [place]  Date 03/01/18  Educator Cloyde Reams  Instruction Review Code  1- Verbalizes Understanding       Oxygen Equipment:  -Group instruction provided by [employer] utilizing handouts, written materials, and Insurance underwriter. Flowsheet Row PULMONARY REHAB CHRONIC OBSTRUCTIVE PULMONARY DISEASE from 04/05/2018 in [place]  Date 03/08/18  Educator [place]  Instruction Review Code 2- Demonstrated Understanding       Signs and Symptoms:  -Group instruction provided by written material and verbal discussion to support subject matter. Warning signs and symptoms of infection, stroke, and heart attack are reviewed and when to call the physician/911 reinforced. Tips for preventing the spread of infection discussed. Flowsheet Row PULMONARY REHAB CHRONIC OBSTRUCTIVE PULMONARY DISEASE from 04/05/2018 in [place]  Date 02/22/18  Educator Remo Lipps  Instruction Review Code 1- Verbalizes Understanding       Advanced Directives:  -Group instruction provided by verbal instruction and written material to support subject matter. Instructor reviews Advanced Directive laws and proper instruction for filling out document.   Pulmonary Video:  -Group video education that reviews the importance of medication and oxygen compliance, exercise, good nutrition, pulmonary hygiene, and pursed lip and diaphragmatic breathing for the pulmonary patient.   Exercise for the Pulmonary Patient:  -Group instruction that is supported by a PowerPoint presentation. Instructor discusses benefits of exercise, core components of exercise, frequency, duration, and intensity of an exercise routine, importance of utilizing pulse oximetry during exercise, safety while exercising, and options of places to exercise outside of rehab.   Flowsheet Row PULMONARY REHAB CHRONIC OBSTRUCTIVE PULMONARY DISEASE from 04/05/2018 in [place]  Date 03/15/18  Instruction Review Code 1- Verbalizes  Understanding       Pulmonary Medications:  -Verbally interactive group education provided by instructor with focus on inhaled medications and proper administration. Flowsheet Row PULMONARY REHAB CHRONIC OBSTRUCTIVE PULMONARY DISEASE from 02/22/2018 in [place]  Date 02/01/18  Educator pharmacy  Instruction Review Code 1- Verbalizes Understanding       Anatomy and Physiology of the Respiratory System and Intimacy:  -Group instruction provided by PowerPoint, verbal discussion, and written material to support subject matter. Instructor reviews respiratory cycle and anatomical components of the respiratory system and their functions. Instructor also reviews differences in obstructive and restrictive respiratory diseases with examples of each. Intimacy, Sex, and Sexuality differences are reviewed with a discussion on how relationships can change when diagnosed with pulmonary disease.  Common sexual concerns are reviewed. Flowsheet Row PULMONARY REHAB CHRONIC OBSTRUCTIVE PULMONARY DISEASE from 04/05/2018 in [place]  Date 04/05/18  Educator rn  Instruction Review Code 2- Demonstrated Understanding       MD DAY -A group question and answer session with a medical doctor that allows participants to ask questions that relate to their pulmonary disease state. Flowsheet Row PULMONARY REHAB CHRONIC OBSTRUCTIVE PULMONARY DISEASE from 04/05/2018 in [place]  Date 03/20/18  Educator Dr. Nelda Marseille  Instruction Review Code 1- Verbalizes Understanding       OTHER EDUCATION -Group or individual verbal, written, or video instructions that support the educational goals of the pulmonary rehab program. Flowsheet Row PULMONARY REHAB CHRONIC OBSTRUCTIVE PULMONARY DISEASE from 02/22/2018 in [place]  Date 02/08/18  Educator Lucianne Lei  [facility] health & Chronic Disease]   Instruction Review Code 1- Verbalizes Understanding       Holiday Eating Survival Tips:  -Group instruction provided by PowerPoint slides, verbal discussion, and written materials to support subject matter. The instructor gives patients tips, tricks, and techniques to help them not only survive but enjoy the holidays despite the onslaught of food that accompanies the holidays.   Knowledge Questionnaire Score:  Knowledge Questionnaire Score - 03/21/22 0908       Knowledge Questionnaire Score   Pre Score 18/18             Core Components/Risk Factors/Patient Goals at Admission:  Personal Goals and Risk Factors at Admission - 03/21/22 0859       Core Components/Risk Factors/Patient Goals on Admission    Weight Management Weight Loss    Improve shortness of breath with ADL's Yes    Intervention Provide education, individualized exercise plan and daily activity instruction to help decrease symptoms of SOB with activities of daily living.    Expected Outcomes Short Term: Improve cardiorespiratory fitness to achieve a reduction of symptoms when performing ADLs;Long Term: Be able to perform more ADLs without symptoms or delay the onset of symptoms             Core Components/Risk Factors/Patient Goals Review:   Goals and Risk Factor Review     Row Name 03/23/22 0857             Core Components/Risk Factors/Patient Goals Review   Personal Goals Review Other;Improve shortness of breath with ADL's;Develop more efficient breathing techniques such as purse lipped breathing and diaphragmatic breathing and practicing self-pacing with activity.;Increase knowledge of respiratory medications and ability to use respiratory devices properly.       Review Weight loss has been an issues for him will continue to monitor his core components as he attends the PR program       Expected Outcomes See admission goal                Core Components/Risk Factors/Patient Goals at Discharge  (Final Review):   Goals and Risk Factor Review - 03/23/22 0857       Core Components/Risk Factors/Patient Goals Review   Personal Goals Review Other;Improve shortness of breath with ADL's;Develop more efficient breathing techniques such as purse lipped breathing and diaphragmatic breathing and practicing self-pacing with activity.;Increase knowledge of respiratory medications and ability to use respiratory devices properly.    Review Weight loss has been an issues for him will continue to monitor his core components as he attends the PR program    Expected Outcomes See admission goal  ITP Comments: Recommend continued exercise, life style modification, education, and utilization of breathing techniques to increase stamina and strength, while also decreasing shortness of breath with exertion.  Dr. Rodman Pickle is Medical Director for Pulmonary Rehab at [facility].

## 2022-03-29 ENCOUNTER — Encounter (HOSPITAL_COMMUNITY)
Admission: RE | Admit: 2022-03-29 | Discharge: 2022-03-29 | Disposition: A | Discharge status: Home / Self Care | Payer: No Typology Code available for payment source | Source: Ambulatory Visit | Attending: Pulmonary Disease | Admitting: Pulmonary Disease

## 2022-03-29 DIAGNOSIS — J449 Chronic obstructive pulmonary disease, unspecified: Secondary | ICD-10-CM

## 2022-03-29 NOTE — Progress Notes (Signed)
Duwane's oxygen saturation dropped during exercise. His resting oxygen saturation was 93% on room air. He walked the track for 2 minutes on room air. His oxygen saturation decreased to 87% on room air. He took a rest break and pursed lip breathing was encouraged. His oxygen saturation did not increase despite the rest break and pursed lip breathing. He was put on supplemental oxygen. His oxygen saturation increased to 92% on 1 liter of oxygen. He kept a pulse oximeter on his finger while walking the track. His oxygen saturation was maintained 91-93% on 1 liter of oxygen.

## 2022-03-29 NOTE — Progress Notes (Signed)
Daily Session Note  Patient Details  Name: Sean [name] MRN: [id number] Date of Birth: 11-Mar-1951 Referring Provider:   [name] Pulmonary Rehab Walk Test from 03/21/2022 in [place]  Referring Provider [name]  [[name]]       Encounter Date: 03/29/2022  Check In:  Session Check In - 03/29/22 1201       Check-In   Supervising physician immediately available to respond to emergencies [facility] immediately available    Physician(s) Dr. Posey Pronto    Location [facility]    Staff Present Sandy Salaam, MS, Exercise Physiologist;Carlette Wilber Oliphant, RN, BSN;Lisa Ysidro Evert, Cathleen Fears, MS, ACSM-CEP, Exercise Physiologist    Virtual Visit No    Medication changes reported     No    Fall or balance concerns reported    No    Tobacco Cessation No Change    Warm-up and Cool-down Performed as group-led instruction    Resistance Training Performed Yes    VAD Patient? No    PAD/SET Patient? No      Pain Assessment   Currently in Pain? No/denies    Multiple Pain Sites No             Capillary Blood Glucose: No results found for this or any previous visit (from the past 24 hour(s)).    Social History   Tobacco Use  Smoking Status Former   Packs/day: 3.00   Years: 45.00   Total pack years: 135.00   Types: Cigarettes   Quit date: 06/25/2013   Years since quitting: 8.7   Passive exposure: Never  Smokeless Tobacco Former   Quit date: 06/25/1976    Goals Met:  Exercise tolerated well No report of concerns or symptoms today Strength training completed today  Goals Unmet:  Not Applicable  Comments: Service time is from 1012 to 1140.    Dr. Rodman Pickle is Medical Director for Pulmonary Rehab at [facility].

## 2022-03-31 ENCOUNTER — Encounter (HOSPITAL_COMMUNITY)
Admission: RE | Admit: 2022-03-31 | Discharge: 2022-03-31 | Disposition: A | Discharge status: Home / Self Care | Payer: No Typology Code available for payment source | Source: Ambulatory Visit | Attending: Pulmonary Disease | Admitting: Pulmonary Disease

## 2022-03-31 DIAGNOSIS — J449 Chronic obstructive pulmonary disease, unspecified: Secondary | ICD-10-CM | POA: Diagnosis not present

## 2022-03-31 NOTE — Progress Notes (Signed)
Daily Session Note  Patient Details  Name: Sean [name] MRN: [id number] Date of Birth: 1951-08-15 Referring Provider:   [name] Pulmonary Rehab Walk Test from 03/21/2022 in [place]  Referring Provider [name]  [[name]]       Encounter Date: 03/31/2022  Check In:  Session Check In - 03/31/22 1137       Check-In   Supervising physician immediately available to respond to emergencies [facility] immediately available    Physician(s) Dr. Lonny Prude    [facility]    Staff Present Rosebud Poles, RN, BSN;Ramon Dredge, RN, Fernande Bras, MS, ACSM-CEP, Exercise Physiologist;Lisa Ysidro Evert, RN    Virtual Visit No    Medication changes reported     No    Fall or balance concerns reported    No    Tobacco Cessation No Change    Warm-up and Cool-down Performed as group-led instruction    Resistance Training Performed Yes    VAD Patient? No    PAD/SET Patient? No      Pain Assessment   Currently in Pain? No/denies    Multiple Pain Sites No             Capillary Blood Glucose: No results found for this or any previous visit (from the past 24 hour(s)).    Social History   Tobacco Use  Smoking Status Former   Packs/day: 3.00   Years: 45.00   Total pack years: 135.00   Types: Cigarettes   Quit date: 06/25/2013   Years since quitting: 8.7   Passive exposure: Never  Smokeless Tobacco Former   Quit date: 06/25/1976    Goals Met:  Proper associated with RPD/PD & O2 Sat Exercise tolerated well No report of concerns or symptoms today Strength training completed today  Goals Unmet:  Not Applicable  Comments: Service time is from 1030 to 1200.    Dr. Rodman Pickle is Medical Director for Pulmonary Rehab at [facility].

## 2022-04-05 ENCOUNTER — Encounter (HOSPITAL_COMMUNITY)
Admission: RE | Admit: 2022-04-05 | Discharge: 2022-04-05 | Disposition: A | Discharge status: Home / Self Care | Payer: No Typology Code available for payment source | Source: Ambulatory Visit | Attending: Pulmonary Disease | Admitting: Pulmonary Disease

## 2022-04-05 ENCOUNTER — Telehealth (HOSPITAL_COMMUNITY): Payer: Self-pay

## 2022-04-05 VITALS — Wt 227.1 lb

## 2022-04-05 DIAGNOSIS — J449 Chronic obstructive pulmonary disease, unspecified: Secondary | ICD-10-CM | POA: Diagnosis not present

## 2022-04-05 NOTE — Telephone Encounter (Signed)
Called [name], Therapist, sports at [place]. Left a message explaining that Yardley's oxygen saturation has fell below 88% on room air and how he has been placed on supplemental oxygen. His O2 sat on 1L is around 90-91%.

## 2022-04-05 NOTE — Progress Notes (Signed)
Daily Session Note  Patient Details  Name: Sean [name] MRN: [id number] Date of Birth: 12-21-1950 Referring Provider:   [name] Pulmonary Rehab Walk Test from 03/21/2022 in [place]  Referring Provider [name]  [[name]]       Encounter Date: 04/05/2022  Check In:  Session Check In - 04/05/22 1126       Check-In   Supervising physician immediately available to respond to emergencies [facility] immediately available    Physician(s) Dr. Lonny Prude    [facility]    Staff Present Rosebud Poles, RN, BSN;Petula Rotolo Olen Cordial BS, ACSM-CEP, Exercise Physiologist;Kaylee Rosana Hoes, MS, ACSM-CEP, Exercise Physiologist;Lisa Ysidro Evert, RN    Virtual Visit No    Medication changes reported     No    Fall or balance concerns reported    No    Tobacco Cessation No Change    Warm-up and Cool-down Performed as group-led instruction    Resistance Training Performed Yes    VAD Patient? No    PAD/SET Patient? No      Pain Assessment   Currently in Pain? No/denies    Multiple Pain Sites No             Capillary Blood Glucose: No results found for this or any previous visit (from the past 24 hour(s)).   Exercise Prescription Changes - 04/05/22 1100       Response to Exercise   Blood Pressure (Admit) 130/70    Blood Pressure (Exercise) 150/70    Blood Pressure (Exit) 132/60    Heart Rate (Admit) 79 bpm    Heart Rate (Exercise) 88 bpm    Heart Rate (Exit) 77 bpm    Oxygen Saturation (Admit) 91 %    Oxygen Saturation (Exercise) 91 %    Oxygen Saturation (Exit) 94 %    Rating of Perceived Exertion (Exercise) 15    Perceived Dyspnea (Exercise) 3    Symptoms none    Duration Continue with 30 min of aerobic exercise without signs/symptoms of physical distress.    Intensity THRR unchanged      Progression   Progression Continue to progress workloads to maintain intensity without signs/symptoms of physical distress.    Average METs 1.7       Resistance Training   Training Prescription Yes    Weight blue bands    Reps 10-15    Time 10 Minutes      Oxygen   Oxygen Continuous    Liters 1      NuStep   Level 2    SPM 80    Minutes 15      Track   Minutes 15    METs 1.7             Social History   Tobacco Use  Smoking Status Former   Packs/day: 3.00   Years: 45.00   Total pack years: 135.00   Types: Cigarettes   Quit date: 06/25/2013   Years since quitting: 8.7   Passive exposure: Never  Smokeless Tobacco Former   Quit date: 06/25/1976    Goals Met:  Independence with exercise equipment Exercise tolerated well No report of concerns or symptoms today Strength training completed today  Goals Unmet:  Not Applicable  Comments: Service time is from 1015 to 1145.    Dr. Rodman Pickle is Medical Director for Pulmonary Rehab at [facility].

## 2022-04-07 ENCOUNTER — Encounter (HOSPITAL_COMMUNITY)
Admission: RE | Admit: 2022-04-07 | Discharge: 2022-04-07 | Disposition: A | Discharge status: Home / Self Care | Payer: No Typology Code available for payment source | Source: Ambulatory Visit | Attending: Pulmonary Disease | Admitting: Pulmonary Disease

## 2022-04-07 DIAGNOSIS — J449 Chronic obstructive pulmonary disease, unspecified: Secondary | ICD-10-CM

## 2022-04-07 NOTE — Progress Notes (Signed)
Daily Session Note  Patient Details  Name: Sean [name] MRN: [id number] Date of Birth: 1950-12-06 Referring Provider:   April [name] Pulmonary Rehab Walk Test from 03/21/2022 in [place]  Referring Provider Sean [name]  [Sean [name]]       Encounter Date: 04/07/2022  Check In:  Session Check In - 04/07/22 1120       Check-In   Supervising physician immediately available to respond to emergencies [facility] immediately available    Physician(s) Dr. Florene [name]    [facility]    Staff Present Sean Poles, RN, BSN;Sean [name] BS, ACSM-CEP, Exercise Physiologist;Sean Legrande Rosana Hoes, MS, ACSM-CEP, Exercise Physiologist;Sean Ysidro Evert, RN    Virtual Visit No    Medication changes reported     No    Fall or balance concerns reported    No    Tobacco Cessation No Change    Warm-up and Cool-down Performed as group-led instruction    Resistance Training Performed Yes    VAD Patient? No    PAD/SET Patient? No      Pain Assessment   Currently in Pain? No/denies    Multiple Pain Sites No             Capillary Blood Glucose: No results found for this or any previous visit (from the past 24 hour(s)).    Social History   Tobacco Use  Smoking Status Former   Packs/day: 3.00   Years: 45.00   Total pack years: 135.00   Types: Cigarettes   Quit date: 06/25/2013   Years since quitting: 8.7   Passive exposure: Never  Smokeless Tobacco Former   Quit date: 06/25/1976    Goals Met:  Proper associated with RPD/PD & O2 Sat Exercise tolerated well No report of concerns or symptoms today Strength training completed today  Goals Unmet:  Not Applicable  Comments: Service time is from 1000 to 1145.    Dr. Rodman [name] is Medical Director for Pulmonary Rehab at [facility].

## 2022-04-12 ENCOUNTER — Encounter (HOSPITAL_COMMUNITY)
Admission: RE | Admit: 2022-04-12 | Discharge: 2022-04-12 | Disposition: A | Discharge status: Home / Self Care | Payer: No Typology Code available for payment source | Source: Ambulatory Visit | Attending: Pulmonary Disease | Admitting: Pulmonary Disease

## 2022-04-12 DIAGNOSIS — J449 Chronic obstructive pulmonary disease, unspecified: Secondary | ICD-10-CM

## 2022-04-12 NOTE — Progress Notes (Signed)
Daily Session Note  Patient Details  Name: Sean [name] MRN: [id number] Date of Birth: 07-15-1951 Referring Provider:   [name] Pulmonary Rehab Walk Test from 03/21/2022 in [place]  Referring Provider [name]  [[name]]       Encounter Date: 04/12/2022  Check In:  Session Check In - 04/12/22 1130       Check-In   Supervising physician immediately available to respond to emergencies [facility] immediately available    Physician(s) Dr. Florene Glen    [facility]    Staff Present Rosebud Poles, RN, BSN;Randi Olen Cordial BS, ACSM-CEP, Exercise Physiologist;Ocie Tino Rosana Hoes, MS, ACSM-CEP, Exercise Physiologist;Lisa Ysidro Evert, RN    Virtual Visit No    Medication changes reported     No    Fall or balance concerns reported    No    Tobacco Cessation No Change    Warm-up and Cool-down Performed as group-led instruction    Resistance Training Performed Yes    VAD Patient? No    PAD/SET Patient? No      Pain Assessment   Currently in Pain? No/denies    Multiple Pain Sites No             Capillary Blood Glucose: No results found for this or any previous visit (from the past 24 hour(s)).    Social History   Tobacco Use  Smoking Status Former   Packs/day: 3.00   Years: 45.00   Total pack years: 135.00   Types: Cigarettes   Quit date: 06/25/2013   Years since quitting: 8.8   Passive exposure: Never  Smokeless Tobacco Former   Quit date: 06/25/1976    Goals Met:  Proper associated with RPD/PD & O2 Sat Exercise tolerated well No report of concerns or symptoms today Strength training completed today  Goals Unmet:  Not Applicable  Comments: Service time is from 1010 to 1145.    Dr. Rodman Pickle is Medical Director for Pulmonary Rehab at [facility].

## 2022-04-14 ENCOUNTER — Encounter (HOSPITAL_COMMUNITY)
Admission: RE | Admit: 2022-04-14 | Discharge: 2022-04-14 | Disposition: A | Discharge status: Home / Self Care | Payer: No Typology Code available for payment source | Source: Ambulatory Visit | Attending: Pulmonary Disease | Admitting: Pulmonary Disease

## 2022-04-14 VITALS — Wt 224.4 lb

## 2022-04-14 DIAGNOSIS — J449 Chronic obstructive pulmonary disease, unspecified: Secondary | ICD-10-CM | POA: Diagnosis not present

## 2022-04-14 NOTE — Progress Notes (Signed)
Daily Session Note  Patient Details  Name: Asir Bingley MRN: [id number] Date of Birth: [birth year] Referring Provider:   [name] Pulmonary Rehab Walk Test from 03/21/2022 in [place]  Referring Provider [name]  [[name]]       Encounter Date: 04/14/2022  Check In:  Session Check In - 04/14/22 1149       Check-In   Supervising physician immediately available to respond to emergencies [facility] immediately available    Physician(s) Dr. Verlon Au    Staff Present Rosebud Poles, RN, BSN;Thornton Dohrmann Ysidro Evert, Cathleen Fears, MS, ACSM-CEP, Exercise Physiologist;Randi [facility], ACSM-CEP, Exercise Physiologist    Virtual Visit No    Medication changes reported     No    Fall or balance concerns reported    No    Tobacco Cessation No Change    Warm-up and Cool-down Performed as group-led instruction    Resistance Training Performed Yes    VAD Patient? No    PAD/SET Patient? No      Pain Assessment   Currently in Pain? No/denies    Multiple Pain Sites No             Capillary Blood Glucose: No results found for this or any previous visit (from the past 24 hour(s)).    Social History   Tobacco Use  Smoking Status Former   Packs/day: 3.00   Years: 45.00   Total pack years: 135.00   Types: Cigarettes   Quit date: 06/25/2013   Years since quitting: 8.8   Passive exposure: Never  Smokeless Tobacco Former   Quit date: 06/25/1976    Goals Met:  Exercise tolerated well No report of concerns or symptoms today Strength training completed today  Goals Unmet:  O2 Sat  Walking on the track he required 2 liters of oxygen and multiple rest breaks.  Comments: Service time is from 1011 to 1140    Dr. Rodman Pickle is Medical Director for Pulmonary Rehab at [facility].

## 2022-04-19 ENCOUNTER — Encounter (HOSPITAL_COMMUNITY)
Admission: RE | Admit: 2022-04-19 | Discharge: 2022-04-19 | Disposition: A | Discharge status: Home / Self Care | Payer: No Typology Code available for payment source | Source: Ambulatory Visit | Attending: Pulmonary Disease | Admitting: Pulmonary Disease

## 2022-04-19 DIAGNOSIS — J449 Chronic obstructive pulmonary disease, unspecified: Secondary | ICD-10-CM | POA: Diagnosis present

## 2022-04-19 NOTE — Progress Notes (Signed)
Daily Session Note  Patient Details  Name: Sean [name] MRN: [id number] Date of Birth: [birth year] Referring Provider:   [name] Pulmonary Rehab Walk Test from 03/21/2022 in [place]  Referring Provider [name]  [[name]]       Encounter Date: 04/14/2022  Check In:   Capillary Blood Glucose: No results found for this or any previous visit (from the past 24 hour(s)).   Exercise Prescription Changes - 04/19/22 1200       Response to Exercise   Blood Pressure (Admit) 154/64    Blood Pressure (Exercise) 154/86    Blood Pressure (Exit) 150/82    Heart Rate (Admit) 73 bpm    Heart Rate (Exercise) 95 bpm    Heart Rate (Exit) 95 bpm    Oxygen Saturation (Admit) 95 %    Oxygen Saturation (Exercise) 92 %    Oxygen Saturation (Exit) 98 %    Rating of Perceived Exertion (Exercise) 15    Perceived Dyspnea (Exercise) 3    Duration Continue with 30 min of aerobic exercise without signs/symptoms of physical distress.    Intensity THRR unchanged      Progression   Progression Continue to progress workloads to maintain intensity without signs/symptoms of physical distress.      Resistance Training   Training Prescription Yes    Weight blue bands    Reps 10-15    Time 10 Minutes      Oxygen   Oxygen Continuous    Liters 2      NuStep   Level 3    SPM 80    Minutes 15      Track   Laps 7    Minutes 15    METs 1.7             Social History   Tobacco Use  Smoking Status Former   Packs/day: 3.00   Years: 45.00   Total pack years: 135.00   Types: Cigarettes   Quit date: 06/25/2013   Years since quitting: 8.8   Passive exposure: Never  Smokeless Tobacco Former   Quit date: 06/25/1976    Goals Met:  Proper associated with RPD/PD & O2 Sat Exercise tolerated well No report of concerns or symptoms today Strength training completed today  Goals Unmet:  Not Applicable  Comments: Service time is from 1020 to 1130.    Dr.  Rodman Pickle is Medical Director for Pulmonary Rehab at [facility].

## 2022-04-19 NOTE — Progress Notes (Signed)
Home Exercise Prescription I have reviewed a Home Exercise Prescription with Sean [name]. Sean [name] is not currently exercising at home. I encouraged him to try walking or exercising at a fitness center. I gave him resources on local fitness center. I discussed with Sean [name] exercising 1 non-rehab day/wk for 30 min/day. I mentioned that Sean [name] can divide his time into 2x15 min. He agreed with my recommendations. Sean [name] seems motivated to exercise at home. The patient stated that their goals were to lose weight and improve health. We reviewed exercise guidelines, target heart rate during exercise, RPE Scale, weather conditions, endpoints for exercise, warmup and cool down. The patient is encouraged to come to me with any questions. I will continue to follow up with the patient to assist them with progression and safety.    Sheppard Plumber, MS, ACSM-CEP 04/19/2022 4:10 PM

## 2022-04-20 NOTE — Progress Notes (Signed)
Pulmonary Individual Treatment Plan  Patient Details  Name: Sean [name] MRN: [id number] Date of Birth: 1951-07-12 Referring Provider:   April [name] Pulmonary Rehab Walk Test from 03/21/2022 in [place]  Referring Provider [name]  [[name]]       Initial Encounter Date:  Flowsheet Row Pulmonary Rehab Walk Test from 03/21/2022 in [place]  Date 03/21/22       Visit Diagnosis: Chronic obstructive pulmonary disease, unspecified COPD type ([facility])  Patient's Home Medications on Admission:   Current Outpatient Medications:    albuterol (PROVENTIL HFA;VENTOLIN HFA) 108 (90 BASE) MCG/ACT inhaler, Inhale 2 puffs into the lungs every 6 (six) hours as needed for wheezing or shortness of breath., Disp: , Rfl:    albuterol (PROVENTIL) (2.5 MG/3ML) 0.083% nebulizer solution, Take 2.5 mg by nebulization every 6 (six) hours as needed for wheezing or shortness of breath., Disp: , Rfl:    amLODipine (NORVASC) 2.5 MG tablet, Take 2.5 mg by mouth daily., Disp: , Rfl:    amoxicillin (AMOXIL) 500 MG capsule, Take 2 capsules (1,000 mg total) by mouth 2 (two) times daily., Disp: 120 capsule, Rfl: 1   aspirin EC 81 MG tablet, Take 81 mg by mouth at bedtime. Swallow whole., Disp: , Rfl:    benazepril (LOTENSIN) 10 MG tablet, Take 1 tablet (10 mg total) by mouth at bedtime., Disp: 90 tablet, Rfl: 3   carboxymethylcellulose (REFRESH PLUS) 0.5 % SOLN, Place 1 drop into both eyes 3 (three) times daily., Disp: , Rfl:    Carboxymethylcellulose Sodium (THERATEARS PF OP), Place 2 drops into both eyes 3 (three) times daily., Disp: , Rfl:    cholecalciferol (VITAMIN D) 1000 UNITS tablet, Take 1,000 Units by mouth 2 (two) times daily., Disp: , Rfl:    Cholecalciferol (VITAMIN D3) 25 MCG (1000 UT) CAPS, Take 1,000 Units by mouth 2 (two) times daily., Disp: , Rfl:    cilostazol (PLETAL) 100 MG tablet, Take 100 mg by mouth 2 (two) times daily., Disp: , Rfl:     erythromycin ophthalmic ointment, Apply to eye. (Patient not taking: Reported on 03/21/2022), Disp: , Rfl:    ferrous sulfate 325 (65 FE) MG tablet, Take 1 tablet by mouth daily., Disp: , Rfl:    folic acid (FOLVITE) 1 MG tablet, Take 1 mg by mouth daily., Disp: , Rfl:    hydrochlorothiazide (HYDRODIURIL) 25 MG tablet, Take 0.5 tablets by mouth daily., Disp: , Rfl:    hydrocortisone (ANUSOL-HC) 2.5 % rectal cream, Place 1 application rectally 3 (three) times daily as needed for hemorrhoids or anal itching., Disp: , Rfl:    hydrocortisone 2.5 % cream, Apply 1 application topically 3 (three) times daily as needed (itching). rectally, Disp: , Rfl:    LACTOBACILLUS PO, Take 2 capsules by mouth daily., Disp: , Rfl:    Magnesium Oxide 420 MG TABS, Take 420 mg by mouth every other day., Disp: , Rfl:    meloxicam (MOBIC) 15 MG tablet, Take 15 mg by mouth daily., Disp: , Rfl:    MENTHOL-METHYL SALICYLATE EX, Apply 1 application topically 2 (two) times daily as needed (for knee pain)., Disp: , Rfl:    metoprolol (TOPROL-XL) 200 MG 24 hr tablet, Take 200 mg by mouth daily., Disp: , Rfl:    Multiple Vitamins-Minerals (MULTIVITAMIN WITH MINERALS) tablet, Take 1 tablet by mouth daily., Disp: , Rfl:    niacin 100 MG tablet, Take 100 mg by mouth in the morning., Disp: , Rfl:  Omega-3 Fatty Acids (FISH OIL) 1000 MG CAPS, Take 1,000 mg by mouth daily., Disp: , Rfl:    pantoprazole (PROTONIX) 40 MG tablet, Take 40 mg by mouth 2 (two) times daily., Disp: , Rfl:    rosuvastatin (CRESTOR) 20 MG tablet, Take 10 mg by mouth daily., Disp: , Rfl:    spironolactone (ALDACTONE) 25 MG tablet, Take 25 mg by mouth daily., Disp: , Rfl:    thiamine 100 MG tablet, Take 100 mg by mouth daily., Disp: , Rfl:   Current Facility-Administered Medications:    0.9 %  sodium chloride infusion, 250 mL, Intravenous, PRN, Sean [name], Corrina, PA-C   alum & mag hydroxide-simeth (MAALOX/MYLANTA) 200-200-20 MG/5ML suspension 15-30 mL, 15-30 mL,  Oral, Q2H PRN, Sean [name], Corrina, PA-C   docusate sodium (COLACE) capsule 100 mg, 100 mg, Oral, BID, Sean [name], Corrina, PA-C   guaiFENesin-dextromethorphan (ROBITUSSIN DM) 100-10 MG/5ML syrup 15 mL, 15 mL, Oral, Q4H PRN, Sean [name], Corrina, PA-C   heparin injection 5,000 Units, 5,000 Units, Subcutaneous, Q8H, Sean [name], Corrina, PA-C   hydrALAZINE (APRESOLINE) injection 5 mg, 5 mg, Intravenous, Q20 Min PRN, Sean [name], Corrina, PA-C   labetalol (NORMODYNE) injection 10 mg, 10 mg, Intravenous, Q10 min PRN, Sean [name], Corrina, PA-C   metoprolol tartrate (LOPRESSOR) injection 2-5 mg, 2-5 mg, Intravenous, Q2H PRN, Sean [name], Corrina, PA-C   ondansetron (ZOFRAN) injection 4 mg, 4 mg, Intravenous, Q6H PRN, Sean [name], Corrina, PA-C   phenol (CHLORASEPTIC) mouth spray 1 spray, 1 spray, Mouth/Throat, PRN, Sean [name], Corrina, PA-C   senna-docusate (Senokot-S) tablet 1 tablet, 1 tablet, Oral, QHS PRN, Sean [name], Corrina, PA-C   sodium chloride flush (NS) 0.9 % injection 3 mL, 3 mL, Intravenous, Q12H, Sean [name], Corrina, PA-C   sodium chloride flush (NS) 0.9 % injection 3 mL, 3 mL, Intravenous, PRN, Sean [name], Corrina, PA-C  Facility-Administered Medications Ordered in Other Encounters:    acetaminophen (TYLENOL) tablet 325-650 mg, 325-650 mg, Oral, Q4H PRN **OR** acetaminophen (TYLENOL) suppository 320-640 mg, 320-640 mg, Rectal, Q4H PRN, Arvilla Market, Corrina, PA-C  Past Medical History: Past Medical History:  Diagnosis Date   Arthritis    COPD (chronic obstructive pulmonary disease) (HCC)    GERD (gastroesophageal reflux disease)    Hyperlipidemia    Hypertension    Sleep apnea     Tobacco Use: Social History   Tobacco Use  Smoking Status Former   Packs/day: 3.00   Years: 45.00   Total pack years: 135.00   Types: Cigarettes   Quit date: 06/25/2013   Years since quitting: 8.8   Passive exposure: Never  Smokeless Tobacco Former   Quit date: 06/25/1976    Labs: Review Flowsheet       Latest Ref Rng & Units 09/28/2020  11/04/2020 11/05/2020  Labs for ITP Cardiac and Pulmonary Rehab  Cholestrol 0 - 200 mg/dL - - 84   LDL (calc) 0 - 99 mg/dL - - 14   HDL-C >40 mg/dL - - 53   Trlycerides <150 mg/dL - - 83   PH, Arterial 7.350 - 7.450 - 7.329  -  PCO2 arterial 32.0 - 48.0 mmHg - 49.1  -  Bicarbonate 20.0 - 28.0 mmol/L - 25.9  -  TCO2 22 - 32 mmol/L _0 -  O2 Saturation % - 98.0  -    Capillary Blood Glucose: No results found for: "GLUCAP"   Pulmonary Assessment Scores:  Pulmonary Assessment Scores     Row Name 03/21/22 0909         ADL UCSD   ADL Phase Entry  SOB Score total 58       CAT Score   CAT Score 13       mMRC Score   mMRC Score 4             UCSD: Self-administered rating of dyspnea associated with activities of daily living (ADLs) 6-point scale (0 = "not at all" to 5 = "maximal or unable to do because of breathlessness")  Scoring Scores range from 0 to 120.  Minimally important difference is 5 units  CAT: CAT can identify the health impairment of COPD patients and is better correlated with disease progression.  CAT has a scoring range of zero to 40. The CAT score is classified into four groups of low (less than 10), medium (10 - 20), high (21-30) and very high (31-40) based on the impact level of disease on health status. A CAT score over 10 suggests significant symptoms.  A worsening CAT score could be explained by an exacerbation, poor medication adherence, poor inhaler technique, or progression of COPD or comorbid conditions.  CAT MCID is 2 points  mMRC: mMRC (Modified Medical Research Council) Dyspnea Scale is used to assess the degree of baseline functional disability in patients of respiratory disease due to dyspnea. No minimal important difference is established. A decrease in score of 1 point or greater is considered a positive change.   Pulmonary Function Assessment:  Pulmonary Function Assessment - 03/21/22 0911       Breath   Bilateral Breath  Sounds Decreased;Clear;Expiratory    Shortness of Breath Fear of Shortness of Breath;Limiting activity;Yes             Exercise Target Goals: Exercise Program Goal: Individual exercise prescription set using results from initial 6 min walk test and THRR while considering  patient's activity barriers and safety.   Exercise Prescription Goal: Initial exercise prescription builds to 30-45 minutes a day of aerobic activity, 2-3 days per week.  Home exercise guidelines will be given to patient during program as part of exercise prescription that the participant will acknowledge.  Activity Barriers & Risk Stratification:  Activity Barriers & Cardiac Risk Stratification - 03/21/22 0901       Activity Barriers & Cardiac Risk Stratification   Activity Barriers Joint Problems;Arthritis   bilateral knee pain            6 Minute Walk:  6 Minute Walk     Row Name 03/21/22 1006         6 Minute Walk   Phase Initial     Distance 1052 feet     Walk Time 6 minutes     # of Rest Breaks 2  1:32-1:44, 2:50-4:00     MPH 1.99     METS 2.37     RPE 19     Perceived Dyspnea  3     VO2 Peak 8.3     Symptoms No     Resting HR 82 bpm     Resting BP 134/72     Resting Oxygen Saturation  90 %     Exercise Oxygen Saturation  during 6 min walk 88 %     Max Ex. HR 102 bpm     Max Ex. BP 184/80     2 Minute Post BP 174/80  4 min post: 140/78       Interval HR   1 Minute HR 94     2 Minute HR 97     3 Minute HR 102  4 Minute HR 98     5 Minute HR 101     6 Minute HR 94     2 Minute Post HR 87     Interval Heart Rate? Yes       Interval Oxygen   Interval Oxygen? Yes     Baseline Oxygen Saturation % 90 %     1 Minute Oxygen Saturation % 89 %     1 Minute Liters of Oxygen 0 L     2 Minute Oxygen Saturation % 88 %     2 Minute Liters of Oxygen 0 L     3 Minute Oxygen Saturation % 90 %     3 Minute Liters of Oxygen 0 L     4 Minute Oxygen Saturation % 91 %     4 Minute Liters  of Oxygen 0 L     5 Minute Oxygen Saturation % 89 %     5 Minute Liters of Oxygen 0 L     6 Minute Oxygen Saturation % 90 %     6 Minute Liters of Oxygen 0 L     2 Minute Post Oxygen Saturation % 92 %     2 Minute Post Liters of Oxygen 0 L              Oxygen Initial Assessment:  Oxygen Initial Assessment - 03/21/22 0900       Home Oxygen   Home Oxygen Device None    Sleep Oxygen Prescription None    Home Exercise Oxygen Prescription None    Home Resting Oxygen Prescription None      Initial 6 min Walk   Oxygen Used None      Program Oxygen Prescription   Program Oxygen Prescription None      Intervention   Short Term Goals To learn and exhibit compliance with exercise, home and travel O2 prescription;To learn and understand importance of maintaining oxygen saturations>88%;To learn and demonstrate proper use of respiratory medications;To learn and understand importance of monitoring SPO2 with pulse oximeter and demonstrate accurate use of the pulse oximeter.;To learn and demonstrate proper pursed lip breathing techniques or other breathing techniques.     Long  Term Goals Exhibits compliance with exercise, home  and travel O2 prescription;Verbalizes importance of monitoring SPO2 with pulse oximeter and return demonstration;Maintenance of O2 saturations>88%;Exhibits proper breathing techniques, such as pursed lip breathing or other method taught during program session;Compliance with respiratory medication;Demonstrates proper use of MDI's             Oxygen Re-Evaluation:  Oxygen Re-Evaluation     Row Name 03/22/22 0910 04/14/22 0809           Program Oxygen Prescription   Program Oxygen Prescription None Continuous      Liters per minute -- 1      Comments -- SPO2: 94% 1L on Nustep and 88% 1L on track. Will consider increasing liter flow on track.        Home Oxygen   Home Oxygen Device None None  [facility]      Sleep Oxygen Prescription  None None      Home Exercise Oxygen Prescription None None      Home Resting Oxygen Prescription None None        Goals/Expected Outcomes   Short Term Goals To learn and exhibit compliance with exercise, home and travel O2 prescription;To learn and understand importance of maintaining oxygen saturations>88%;To  learn and demonstrate proper use of respiratory medications;To learn and understand importance of monitoring SPO2 with pulse oximeter and demonstrate accurate use of the pulse oximeter.;To learn and demonstrate proper pursed lip breathing techniques or other breathing techniques.  To learn and exhibit compliance with exercise, home and travel O2 prescription;To learn and understand importance of maintaining oxygen saturations>88%;To learn and demonstrate proper use of respiratory medications;To learn and understand importance of monitoring SPO2 with pulse oximeter and demonstrate accurate use of the pulse oximeter.;To learn and demonstrate proper pursed lip breathing techniques or other breathing techniques.       Long  Term Goals Exhibits compliance with exercise, home  and travel O2 prescription;Verbalizes importance of monitoring SPO2 with pulse oximeter and return demonstration;Maintenance of O2 saturations>88%;Exhibits proper breathing techniques, such as pursed lip breathing or other method taught during program session;Compliance with respiratory medication;Demonstrates proper use of MDI's Exhibits compliance with exercise, home  and travel O2 prescription;Verbalizes importance of monitoring SPO2 with pulse oximeter and return demonstration;Maintenance of O2 saturations>88%;Exhibits proper breathing techniques, such as pursed lip breathing or other method taught during program session;Compliance with respiratory medication;Demonstrates proper use of MDI's      Goals/Expected Outcomes Compliance and understanding of oxygen saturation monitoring and breathing techniques to decrease shortness of  breath. Compliance and understanding of oxygen saturation monitoring and breathing techniques to decrease shortness of breath.               Oxygen Discharge (Final Oxygen Re-Evaluation):  Oxygen Re-Evaluation - 04/14/22 0809       Program Oxygen Prescription   Program Oxygen Prescription Continuous    Liters per minute 1    Comments SPO2: 94% 1L on Nustep and 88% 1L on track. Will consider increasing liter flow on track.      Home Oxygen   Home Oxygen Device None   [facility]   Sleep Oxygen Prescription None    Home Exercise Oxygen Prescription None    Home Resting Oxygen Prescription None      Goals/Expected Outcomes   Short Term Goals To learn and exhibit compliance with exercise, home and travel O2 prescription;To learn and understand importance of maintaining oxygen saturations>88%;To learn and demonstrate proper use of respiratory medications;To learn and understand importance of monitoring SPO2 with pulse oximeter and demonstrate accurate use of the pulse oximeter.;To learn and demonstrate proper pursed lip breathing techniques or other breathing techniques.     Long  Term Goals Exhibits compliance with exercise, home  and travel O2 prescription;Verbalizes importance of monitoring SPO2 with pulse oximeter and return demonstration;Maintenance of O2 saturations>88%;Exhibits proper breathing techniques, such as pursed lip breathing or other method taught during program session;Compliance with respiratory medication;Demonstrates proper use of MDI's    Goals/Expected Outcomes Compliance and understanding of oxygen saturation monitoring and breathing techniques to decrease shortness of breath.             Initial Exercise Prescription:  Initial Exercise Prescription - 03/21/22 1000       Date of Initial Exercise RX and Referring Provider   Date 03/21/22    Referring Provider Sean [name]   Expected Discharge Date 05/26/22      NuStep   Level 1     SPM 80    Minutes 15      Track   Minutes 15    METs 2.37      Prescription Details   Frequency (times per week) 2    Duration  Progress to 30 minutes of continuous aerobic without signs/symptoms of physical distress      Intensity   THRR 40-80% of Max Heartrate 60-121    Ratings of Perceived Exertion 11-13    Perceived Dyspnea 0-4      Progression   Progression Continue progressive overload as per policy without signs/symptoms or physical distress.      Resistance Training   Training Prescription Yes    Weight blue bands    Reps 10-15             Perform Capillary Blood Glucose checks as needed.  Exercise Prescription Changes:   Exercise Prescription Changes     Row Name 04/05/22 1100 04/19/22 1200           Response to Exercise   Blood Pressure (Admit) 130/70 154/64      Blood Pressure (Exercise) 150/70 154/86      Blood Pressure (Exit) 132/60 150/82      Heart Rate (Admit) 79 bpm 73 bpm      Heart Rate (Exercise) 88 bpm 95 bpm      Heart Rate (Exit) 77 bpm 95 bpm      Oxygen Saturation (Admit) 91 % 95 %      Oxygen Saturation (Exercise) 91 % 92 %      Oxygen Saturation (Exit) 94 % 98 %      Rating of Perceived Exertion (Exercise) 15 15      Perceived Dyspnea (Exercise) 3 3      Symptoms none --      Duration Continue with 30 min of aerobic exercise without signs/symptoms of physical distress. Continue with 30 min of aerobic exercise without signs/symptoms of physical distress.      Intensity THRR unchanged THRR unchanged        Progression   Progression Continue to progress workloads to maintain intensity without signs/symptoms of physical distress. Continue to progress workloads to maintain intensity without signs/symptoms of physical distress.      Average METs 1.7 --        Resistance Training   Training Prescription Yes Yes      Weight blue bands blue bands      Reps 10-15 10-15      Time 10 Minutes 10 Minutes        Oxygen   Oxygen  Continuous Continuous      Liters 1 2        NuStep   Level 2 3      SPM 80 80      Minutes 15 15        Track   Laps -- 7      Minutes 15 15      METs 1.7 1.7               Exercise Comments:   Exercise Comments     Row Name 03/29/22 1225 04/19/22 1607         Exercise Comments Pt completed first day of exercise. He exercised for 15 min on the Nustep and track. Wilber 1.8 METs at level 1 on the Nustep and 1.81 METs on the track. His O2 dropped on the track and he was places on O2. Delonta performed the warmup and cooldown standing without limitations. Discussed METs and how to increase METs with him. Completed home exercise plan. Raijon is not currently exercising at home. I encouraged him to try walking or exercising at a fitness center. I gave him resources on local  fitness center. I discussed with Alphonsa exercising 1 non-rehab day/wk for 30 min/day. I mentioned that Asuncion can divide his time into 2x15 min. He agreed with my recommendations. Nunzio seems motivated to exercise at home.               Exercise Goals and Review:   Exercise Goals     Row Name 03/21/22 0901 03/22/22 0908 04/14/22 0805         Exercise Goals   Increase Physical Activity Yes Yes Yes     Intervention Provide advice, education, support and counseling about physical activity/exercise needs.;Develop an individualized exercise prescription for aerobic and resistive training based on initial evaluation findings, risk stratification, comorbidities and participant's personal goals. Provide advice, education, support and counseling about physical activity/exercise needs.;Develop an individualized exercise prescription for aerobic and resistive training based on initial evaluation findings, risk stratification, comorbidities and participant's personal goals. Provide advice, education, support and counseling about physical activity/exercise needs.;Develop an individualized exercise prescription for aerobic and  resistive training based on initial evaluation findings, risk stratification, comorbidities and participant's personal goals.     Expected Outcomes Short Term: Attend rehab on a regular basis to increase amount of physical activity.;Long Term: Add in home exercise to make exercise part of routine and to increase amount of physical activity.;Long Term: Exercising regularly at least 3-5 days a week. Short Term: Attend rehab on a regular basis to increase amount of physical activity.;Long Term: Add in home exercise to make exercise part of routine and to increase amount of physical activity.;Long Term: Exercising regularly at least 3-5 days a week. Short Term: Attend rehab on a regular basis to increase amount of physical activity.;Long Term: Add in home exercise to make exercise part of routine and to increase amount of physical activity.;Long Term: Exercising regularly at least 3-5 days a week.     Increase Strength and Stamina Yes Yes Yes     Intervention Develop an individualized exercise prescription for aerobic and resistive training based on initial evaluation findings, risk stratification, comorbidities and participant's personal goals.;Provide advice, education, support and counseling about physical activity/exercise needs. Develop an individualized exercise prescription for aerobic and resistive training based on initial evaluation findings, risk stratification, comorbidities and participant's personal goals.;Provide advice, education, support and counseling about physical activity/exercise needs. Develop an individualized exercise prescription for aerobic and resistive training based on initial evaluation findings, risk stratification, comorbidities and participant's personal goals.;Provide advice, education, support and counseling about physical activity/exercise needs.     Expected Outcomes Short Term: Increase workloads from initial exercise prescription for resistance, speed, and METs.;Short Term:  Perform resistance training exercises routinely during rehab and add in resistance training at home;Long Term: Improve cardiorespiratory fitness, muscular endurance and strength as measured by increased METs and functional capacity (6MWT) Short Term: Increase workloads from initial exercise prescription for resistance, speed, and METs.;Short Term: Perform resistance training exercises routinely during rehab and add in resistance training at home;Long Term: Improve cardiorespiratory fitness, muscular endurance and strength as measured by increased METs and functional capacity (6MWT) Short Term: Increase workloads from initial exercise prescription for resistance, speed, and METs.;Short Term: Perform resistance training exercises routinely during rehab and add in resistance training at home;Long Term: Improve cardiorespiratory fitness, muscular endurance and strength as measured by increased METs and functional capacity (6MWT)     Able to understand and use rate of perceived exertion (RPE) scale Yes Yes Yes     Intervention Provide education and explanation on how to use RPE scale  Provide education and explanation on how to use RPE scale Provide education and explanation on how to use RPE scale     Expected Outcomes Short Term: Able to use RPE daily in rehab to express subjective intensity level;Long Term:  Able to use RPE to guide intensity level when exercising independently Short Term: Able to use RPE daily in rehab to express subjective intensity level;Long Term:  Able to use RPE to guide intensity level when exercising independently Short Term: Able to use RPE daily in rehab to express subjective intensity level;Long Term:  Able to use RPE to guide intensity level when exercising independently     Able to understand and use Dyspnea scale Yes Yes Yes     Intervention Provide education and explanation on how to use Dyspnea scale Provide education and explanation on how to use Dyspnea scale Provide education  and explanation on how to use Dyspnea scale     Expected Outcomes Short Term: Able to use Dyspnea scale daily in rehab to express subjective sense of shortness of breath during exertion;Long Term: Able to use Dyspnea scale to guide intensity level when exercising independently Short Term: Able to use Dyspnea scale daily in rehab to express subjective sense of shortness of breath during exertion;Long Term: Able to use Dyspnea scale to guide intensity level when exercising independently Short Term: Able to use Dyspnea scale daily in rehab to express subjective sense of shortness of breath during exertion;Long Term: Able to use Dyspnea scale to guide intensity level when exercising independently     Knowledge and understanding of Target Heart Rate Range (THRR) Yes Yes Yes     Intervention Provide education and explanation of THRR including how the numbers were predicted and where they are located for reference Provide education and explanation of THRR including how the numbers were predicted and where they are located for reference Provide education and explanation of THRR including how the numbers were predicted and where they are located for reference     Expected Outcomes Short Term: Able to state/look up THRR;Long Term: Able to use THRR to govern intensity when exercising independently;Short Term: Able to use daily as guideline for intensity in rehab Short Term: Able to state/look up THRR;Long Term: Able to use THRR to govern intensity when exercising independently;Short Term: Able to use daily as guideline for intensity in rehab Short Term: Able to state/look up THRR;Long Term: Able to use THRR to govern intensity when exercising independently;Short Term: Able to use daily as guideline for intensity in rehab     Understanding of Exercise Prescription Yes Yes Yes     Intervention Provide education, explanation, and written materials on patient's individual exercise prescription Provide education, explanation,  and written materials on patient's individual exercise prescription Provide education, explanation, and written materials on patient's individual exercise prescription     Expected Outcomes Short Term: Able to explain program exercise prescription;Long Term: Able to explain home exercise prescription to exercise independently Short Term: Able to explain program exercise prescription;Long Term: Able to explain home exercise prescription to exercise independently Short Term: Able to explain program exercise prescription;Long Term: Able to explain home exercise prescription to exercise independently              Exercise Goals Re-Evaluation :  Exercise Goals Re-Evaluation     Row Name 03/22/22 0909 04/14/22 0805           Exercise Goal Re-Evaluation   Exercise Goals Review Increase Physical Activity;Increase Strength and Stamina;Able to understand  and use rate of perceived exertion (RPE) scale;Able to understand and use Dyspnea scale;Knowledge and understanding of Target Heart Rate Range (THRR);Understanding of Exercise Prescription Increase Physical Activity;Increase Strength and Stamina;Able to understand and use rate of perceived exertion (RPE) scale;Able to understand and use Dyspnea scale;Knowledge and understanding of Target Heart Rate Range (THRR);Understanding of Exercise Prescription      Comments Ranald is scheduled to begin exercise next week. Will continue to monitor and progress as able. Jasmon has completed 5 exercise sessions. He exercises for 15 min on the Nustep and track. He averages 2.0 METs at level 2 on the Nustep and 1.7 METs on the track. He has increases his workload on the Nustep once. He tolerated this well. Will consider progressing him again. Shelly is deconditioned. His METs on the Nustep and the track have remained relatively the same. Will discuss pacing strategies for the track. He performs the warmup and cooldown standing mostly. He does alternating leg raises during the  squats. Will continue to monitor and progress as able.      Expected Outcomes Through exercise at rehab and home, the patient will decrease shortness of breath with daily activities and feel confident in carrying out an exercise regimen at home. Through exercise at rehab and home, the patient will decrease shortness of breath with daily activities and feel confident in carrying out an exercise regimen at home.               Discharge Exercise Prescription (Final Exercise Prescription Changes):  Exercise Prescription Changes - 04/19/22 1200       Response to Exercise   Blood Pressure (Admit) 154/64    Blood Pressure (Exercise) 154/86    Blood Pressure (Exit) 150/82    Heart Rate (Admit) 73 bpm    Heart Rate (Exercise) 95 bpm    Heart Rate (Exit) 95 bpm    Oxygen Saturation (Admit) 95 %    Oxygen Saturation (Exercise) 92 %    Oxygen Saturation (Exit) 98 %    Rating of Perceived Exertion (Exercise) 15    Perceived Dyspnea (Exercise) 3    Duration Continue with 30 min of aerobic exercise without signs/symptoms of physical distress.    Intensity THRR unchanged      Progression   Progression Continue to progress workloads to maintain intensity without signs/symptoms of physical distress.      Resistance Training   Training Prescription Yes    Weight blue bands    Reps 10-15    Time 10 Minutes      Oxygen   Oxygen Continuous    Liters 2      NuStep   Level 3    SPM 80    Minutes 15      Track   Laps 7    Minutes 15    METs 1.7             Nutrition:  Target Goals: Understanding of nutrition guidelines, daily intake of sodium <1518m, cholesterol <2027m calories 30% from fat and 7% or less from saturated fats, daily to have 5 or more servings of fruits and vegetables.  Biometrics:    Nutrition Therapy Plan and Nutrition Goals:  Nutrition Therapy & Goals - 03/29/22 1155       Nutrition Therapy   Diet Heart Healthy Diet      Personal Nutrition Goals    Nutrition Goal Patient to choose a daily variety of fruits,vegetables, whole grains, lean protein/plant protein, nonfat dairy as part of  heart healthy lifestyle    Personal Goal #2 Patient to understand strategies for weight loss of 0.5-2.0# per week.    Personal Goal #3 Patient to identify and limit daily intake of saturated fat, trans fat, sodium, and refined carbohydrates.    Comments Medical history of  chronic alcoholic hepatitis and fatty liver; patient does have history of low sodium levels though they are WNL at most recent labs. Lipid panel WNL, A1c 6.2. He continues taking MVI and thiamine. He does have follow-up with sleep medicine regarding biPAP. He reports motivation to lose weight; he lives alone and does his own grocery shopping and cooking. 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 He is up 25.3# since he started pulmonary rehab in May 2019.      Intervention Plan   Intervention Prescribe, educate and counsel regarding individualized specific dietary modifications aiming towards targeted core components such as weight, hypertension, lipid management, diabetes, heart failure and other comorbidities.    Expected Outcomes Short Term Goal: Understand basic principles of dietary content, such as calories, fat, sodium, cholesterol and nutrients.;Long Term Goal: Adherence to prescribed nutrition plan.             Nutrition Assessments:  Nutrition Assessments - 03/29/22 1441       Rate Your Plate Scores   Pre Score 51            MEDIFICTS Score Key: ?70 Need to make dietary changes  40-70 Heart Healthy Diet ? 40 Therapeutic Level Cholesterol Diet  Flowsheet Row PULMONARY REHAB CHRONIC OBSTRUCTIVE PULMONARY DISEASE from 03/29/2022 in [place]  Picture Your Plate Total Score on Admission 51      Picture Your Plate Scores: <91 Unhealthy dietary pattern with much room for improvement. 41-50 Dietary pattern unlikely to meet  recommendations for good health and room for improvement. 51-60 More healthful dietary pattern, with some room for improvement.  >60 Healthy dietary pattern, although there may be some specific behaviors that could be improved.    Nutrition Goals Re-Evaluation:  Nutrition Goals Re-Evaluation     [place] Name 03/29/22 1155             Goals   Current Weight 227 lb 8.2 oz (103.2 kg)       Comment Medical history of  chronic alcoholic hepatitis and fatty liver; patient does have history of low sodium levels though they are WNL at most recent labs. Lipid panel WNL, A1c 6.2. He continues taking MVI and thiamine. He does have follow-up with sleep medicine regarding biPAP. He reports motivation to lose weight; he lives alone and does his own grocery shopping and cooking.  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                Nutrition Goals Discharge (Final Nutrition Goals Re-Evaluation):  Nutrition Goals Re-Evaluation - 03/29/22 1155       Goals   Current Weight 227 lb 8.2 oz (103.2 kg)    Comment Medical history of  chronic alcoholic hepatitis and fatty liver; patient does have history of low sodium levels though they are WNL at most recent labs. Lipid panel WNL, A1c 6.2. He continues taking MVI and thiamine. He does have follow-up with sleep medicine regarding biPAP. He reports motivation to lose weight; he lives alone and does his own grocery shopping and cooking.  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  Psychosocial: Target Goals: Acknowledge presence or absence of significant depression and/or stress, maximize coping skills, provide positive support system. Participant is able to verbalize types and ability to use techniques and skills needed for reducing stress and depression.  Initial Review & Psychosocial Screening:  Initial Psych Review & Screening - 03/21/22 0856       Initial Review   Current issues with None  Identified      Family Dynamics   Good Support System? Yes    Comments friend      Barriers   Psychosocial barriers to participate in program There are no identifiable barriers or psychosocial needs.      Screening Interventions   Interventions Encouraged to exercise             Quality of Life Scores:  Scores of 19 and below usually indicate a poorer quality of life in these areas.  A difference of  2-3 points is a clinically meaningful difference.  A difference of 2-3 points in the total score of the Quality of Life Index has been associated with significant improvement in overall quality of life, self-image, physical symptoms, and general health in studies assessing change in quality of life.  PHQ-9: Review Flowsheet  More data may exist      03/21/2022 04/10/2018 12/29/2017 10/29/2015 06/29/2015  Depression screen PHQ 2/9  Decreased Interest 0 0 0 0 1  Down, Depressed, Hopeless 0 0 0 0 0  PHQ - 2 Score 0 0 0 0 1  Altered sleeping 0 0 0 - -  Tired, decreased energy 0 1 1 - -  Change in appetite 0 0 0 - -  Feeling bad or failure about yourself  0 0 0 - -  Trouble concentrating 0 0 0 - -  Moving slowly or fidgety/restless 0 0 0 - -  Suicidal thoughts 0 0 0 - -  PHQ-9 Score 0 1 1 - -  Difficult doing work/chores - Somewhat difficult Somewhat difficult - -   Interpretation of Total Score  Total Score Depression Severity:  1-4 = Minimal depression, 5-9 = Mild depression, 10-14 = Moderate depression, 15-19 = Moderately severe depression, 20-27 = Severe depression   Psychosocial Evaluation and Intervention:  Psychosocial Evaluation - 03/21/22 0857       Psychosocial Evaluation & Interventions   Interventions Encouraged to exercise with the program and follow exercise prescription    Expected Outcomes For to participate in Pulmonary Rehab    Continue Psychosocial Services  Follow up required by staff             Psychosocial Re-Evaluation:  Psychosocial  Re-Evaluation     Row Name 03/23/22 0854 04/13/22 1529           Psychosocial Re-Evaluation   Current issues with None Identified None Identified      Comments Henri has attended his orientation session 03/21/22  He is scheduled to start the program 03/29/22 will continue to monitor for  his psychosocial barriers. Mohsen has been attend the PR program without any psychosocial barriers. He is enjoying the class and communicating with the other class members.      Expected Outcomes For him to attended without any psychosocial issues concerns. For him to continue to participate in the program without any psychosocial concerns or barriers.      Interventions Encouraged to attend Pulmonary Rehabilitation for the exercise Encouraged to attend Pulmonary Rehabilitation for the exercise      Continue Psychosocial Services  Follow up  required by staff No Follow up required               Psychosocial Discharge (Final Psychosocial Re-Evaluation):  Psychosocial Re-Evaluation - 04/13/22 1529       Psychosocial Re-Evaluation   Current issues with None Identified    Comments Giovanni has been attend the PR program without any psychosocial barriers. He is enjoying the class and communicating with the other class members.    Expected Outcomes For him to continue to participate in the program without any psychosocial concerns or barriers.    Interventions Encouraged to attend Pulmonary Rehabilitation for the exercise    Continue Psychosocial Services  No Follow up required             Education: Education Goals: Education classes will be provided on a weekly basis, covering required topics. Participant will state understanding/return demonstration of topics presented.  Learning Barriers/Preferences:   Education Topics: Introduction to Pulmonary Rehab Group instruction provided by PowerPoint, verbal discussion, and written material to support subject matter. Instructor reviews what Pulmonary Rehab is,  the purpose of the program, and how patients are referred.     Know Your Numbers Group instruction that is supported by a PowerPoint presentation. Instructor discusses importance of knowing and understanding resting, exercise, and post-exercise oxygen saturation, heart rate, and blood pressure. Oxygen saturation, heart rate, blood pressure, rating of perceived exertion, and dyspnea are reviewed along with a normal range for these values.    Exercise for the Pulmonary Patient Group instruction that is supported by a PowerPoint presentation. Instructor discusses benefits of exercise, core components of exercise, frequency, duration, and intensity of an exercise routine, importance of utilizing pulse oximetry during exercise, safety while exercising, and options of places to exercise outside of rehab.  Flowsheet Row PULMONARY REHAB CHRONIC OBSTRUCTIVE PULMONARY DISEASE from 04/05/2018 in [place]  Date 03/15/18  Instruction Review Code 1- Verbalizes Understanding          MET Level  Group instruction provided by PowerPoint, verbal discussion, and written material to support subject matter. Instructor reviews what METs are and how to increase METs.    Pulmonary Medications Verbally interactive group education provided by instructor with focus on inhaled medications and proper administration. Flowsheet Row PULMONARY REHAB CHRONIC OBSTRUCTIVE PULMONARY DISEASE from 04/14/2022 in [place]  Date 04/07/22  Educator Donnetta Simpers  Instruction Review Code 1- Verbalizes Understanding       Anatomy and Physiology of the Respiratory System Group instruction provided by PowerPoint, verbal discussion, and written material to support subject matter. Instructor reviews respiratory cycle and anatomical components of the respiratory system and their functions. Instructor also reviews differences in obstructive and restrictive respiratory  diseases with examples of each.  Flowsheet Row PULMONARY REHAB CHRONIC OBSTRUCTIVE PULMONARY DISEASE from 04/14/2022 in [place]  Date 04/14/22  Educator Donnetta Simpers  [Handout]  Instruction Review Code 1- Verbalizes Understanding       Oxygen Safety Group instruction provided by PowerPoint, verbal discussion, and written material to support subject matter. There is an overview of "What is Oxygen" and "Why do we need it".  Instructor also reviews how to create a safe environment for oxygen use, the importance of using oxygen as prescribed, and the risks of noncompliance. There is a brief discussion on traveling with oxygen and resources the patient may utilize. Flowsheet Row PULMONARY REHAB CHRONIC OBSTRUCTIVE PULMONARY DISEASE from 04/05/2018 in [place]  Date 03/01/18  Educator Cloyde Reams  Instruction Review Code 1- Verbalizes Understanding       Oxygen Use Group instruction provided by PowerPoint, verbal discussion, and written material to discuss how supplemental oxygen is prescribed and different types of oxygen supply systems. Resources for more information are provided.    Breathing Techniques Group instruction that is supported by demonstration and informational handouts. Instructor discusses the benefits of pursed lip and diaphragmatic breathing and detailed demonstration on how to perform both.     Risk Factor Reduction Group instruction that is supported by a PowerPoint presentation. Instructor discusses the definition of a risk factor, different risk factors for pulmonary disease, and how the heart and lungs work together.   MD Day A group question and answer session with a medical doctor that allows participants to ask questions that relate to their pulmonary disease state. Flowsheet Row PULMONARY REHAB CHRONIC OBSTRUCTIVE PULMONARY DISEASE from 04/05/2018 in [place]  Date 03/20/18   Educator Dr. Nelda Marseille  Instruction Review Code 1- Verbalizes Understanding       Nutrition for the Pulmonary Patient Group instruction provided by PowerPoint slides, verbal discussion, and written materials to support subject matter. The instructor gives an explanation and review of healthy diet recommendations, which includes a discussion on weight management, recommendations for fruit and vegetable consumption, as well as protein, fluid, caffeine, fiber, sodium, sugar, and alcohol. Tips for eating when patients are short of breath are discussed. Flowsheet Row PULMONARY REHAB CHRONIC OBSTRUCTIVE PULMONARY DISEASE from 04/14/2022 in [place]  Date 03/31/22  Educator Sam  Instruction Review Code 2- Demonstrated Understanding        Other Education Group or individual verbal, written, or video instructions that support the educational goals of the pulmonary rehab program. Flowsheet Row PULMONARY REHAB CHRONIC OBSTRUCTIVE PULMONARY DISEASE from 02/22/2018 in [place]  Date 02/08/18  Educator Lucianne Lei  [facility]]  Instruction Review Code 1- Verbalizes Understanding        Knowledge Questionnaire Score:  Knowledge Questionnaire Score - 03/21/22 0908       Knowledge Questionnaire Score   Pre Score 18/18             Core Components/Risk Factors/Patient Goals at Admission:  Personal Goals and Risk Factors at Admission - 03/21/22 0859       Core Components/Risk Factors/Patient Goals on Admission    Weight Management Weight Loss    Improve shortness of breath with ADL's Yes    Intervention Provide education, individualized exercise plan and daily activity instruction to help decrease symptoms of SOB with activities of daily living.    Expected Outcomes Short Term: Improve cardiorespiratory fitness to achieve a reduction of symptoms when performing ADLs;Long Term: Be able to perform more  ADLs without symptoms or delay the onset of symptoms             Core Components/Risk Factors/Patient Goals Review:   Goals and Risk Factor Review     Row Name 03/23/22 0857 04/13/22 1534           Core Components/Risk Factors/Patient Goals Review   Personal Goals Review Other;Improve shortness of breath with ADL's;Develop more efficient breathing techniques such as purse lipped breathing and diaphragmatic breathing and practicing self-pacing with activity.;Increase knowledge of respiratory medications and ability to use respiratory devices properly. Weight Management/Obesity;Improve shortness of breath with ADL's;Develop more efficient breathing techniques such as purse lipped breathing and diaphragmatic  breathing and practicing self-pacing with activity.      Review Weight loss has been an issues for him will continue to monitor his core components as he attends the PR program [place] is exercising walking the track and on the nustep. He has been slowly increasing his workloads and METS. We did have to place him on oxygen 1 iter for exercise due to desaturation. He has been accepting to being on oxygen at home if it is necessary. We have been trying to reach out to the [place] to let them know that he has required oxygen for exercising.We have yet to hear back from a message that was left for them.      Expected Outcomes See admission goal See admission goals.               Core Components/Risk Factors/Patient Goals at Discharge (Final Review):   Goals and Risk Factor Review - 04/13/22 1534       Core Components/Risk Factors/Patient Goals Review   Personal Goals Review Weight Management/Obesity;Improve shortness of breath with ADL's;Develop more efficient breathing techniques such as purse lipped breathing and diaphragmatic breathing and practicing self-pacing with activity.    Review Tagen is exercising walking the track and on the nustep. He has been slowly increasing his workloads and  METS. We did have to place him on oxygen 1 iter for exercise due to desaturation. He has been accepting to being on oxygen at home if it is necessary. We have been trying to reach out to the [place] to let them know that he has required oxygen for exercising.We have yet to hear back from a message that was left for them.    Expected Outcomes See admission goals.             ITP Comments: ITP REVIEW Pt is making expected progress toward pulmonary rehab goals after completing 7 sessions. Recommend continued exercise, life style modification, education, and utilization of breathing techniques to increase stamina and strength and decrease shortness of breath with exertion.    Comments: Dr. Rodman Pickle is Medical Director for Pulmonary Rehab at [facility].

## 2022-04-21 ENCOUNTER — Encounter (HOSPITAL_COMMUNITY)
Admission: RE | Admit: 2022-04-21 | Discharge: 2022-04-21 | Disposition: A | Discharge status: Home / Self Care | Payer: No Typology Code available for payment source | Source: Ambulatory Visit | Attending: Pulmonary Disease | Admitting: Pulmonary Disease

## 2022-04-21 ENCOUNTER — Telehealth (HOSPITAL_COMMUNITY): Payer: Self-pay | Admitting: *Deleted

## 2022-04-21 DIAGNOSIS — J449 Chronic obstructive pulmonary disease, unspecified: Secondary | ICD-10-CM

## 2022-04-21 NOTE — Progress Notes (Signed)
Daily Session Note  Patient Details  Name: Sean [name] MRN: [id number] Date of Birth: 1951-01-31 Referring Provider:   [name] Pulmonary Rehab Walk Test from 03/21/2022 in [place]  Referring Provider [name]  [[name]]       Encounter Date: 04/21/2022  Check In:  Session Check In - 04/21/22 1145       Check-In   Supervising physician immediately available to respond to emergencies [facility] immediately available    Physician(s) Dr. Cyndia Skeeters    Location [facility]    Staff Present Rosebud Poles, RN, BSN;Randi Olen Cordial BS, ACSM-CEP, Exercise Physiologist;Lottie Siska Rosana Hoes, MS, ACSM-CEP, Exercise Physiologist;Lisa Ysidro Evert, RN    Virtual Visit No    Medication changes reported     No    Fall or balance concerns reported    No    Tobacco Cessation No Change    Warm-up and Cool-down Performed as group-led instruction    Resistance Training Performed Yes    VAD Patient? No    PAD/SET Patient? No      Pain Assessment   Currently in Pain? No/denies    Multiple Pain Sites No             Capillary Blood Glucose: No results found for this or any previous visit (from the past 24 hour(s)).    Social History   Tobacco Use  Smoking Status Former   Packs/day: 3.00   Years: 45.00   Total pack years: 135.00   Types: Cigarettes   Quit date: 06/25/2013   Years since quitting: 8.8   Passive exposure: Never  Smokeless Tobacco Former   Quit date: 06/25/1976    Goals Met:  Exercise tolerated well No report of concerns or symptoms today Strength training completed today  Goals Unmet:  Not Applicable  Comments: Service time is from 1018 to 1135.    Dr. Rodman Pickle is Medical Director for Pulmonary Rehab at [facility].

## 2022-04-21 NOTE — Telephone Encounter (Signed)
Called [name] RN at [place] to notify them that Sean [name]'s room air oxygen saturation was 86%. He is very SOB with any exertion. He has a walk test scheduled in November. I called to notify them of this and to see if he could get an appointment any sooner.

## 2022-04-25 ENCOUNTER — Telehealth (HOSPITAL_COMMUNITY): Payer: Self-pay

## 2022-04-25 NOTE — Telephone Encounter (Signed)
Returned [employer]. Judea has heard back from the [place], but is unsure what his appointment is for. Explained to him that it may be a 6 MWT. Pt voiced understanding.

## 2022-04-26 ENCOUNTER — Encounter (HOSPITAL_COMMUNITY)
Admission: RE | Admit: 2022-04-26 | Discharge: 2022-04-26 | Disposition: A | Discharge status: Home / Self Care | Payer: No Typology Code available for payment source | Source: Ambulatory Visit | Attending: Pulmonary Disease | Admitting: Pulmonary Disease

## 2022-04-26 DIAGNOSIS — J449 Chronic obstructive pulmonary disease, unspecified: Secondary | ICD-10-CM | POA: Diagnosis not present

## 2022-04-26 NOTE — Progress Notes (Signed)
Daily Session Note  Patient Details  Name: Sean [name] MRN: [id number] Date of Birth: 12-08-1950 Referring Provider:   [name] Pulmonary Rehab Walk Test from 03/21/2022 in [place]  Referring Provider [name]  [[name]]       Encounter Date: 04/26/2022  Check In:  Session Check In - 04/26/22 1151       Check-In   Supervising physician immediately available to respond to emergencies [facility] immediately available    Staff Present Rosebud Poles, RN, Quentin Ore, MS, ACSM-CEP, Exercise Physiologist;Quashaun Lazalde Yevonne Pax, ACSM-CEP, Exercise Physiologist;Carlette Wilber Oliphant, RN, BSN    Virtual Visit No    Medication changes reported     No    Fall or balance concerns reported    No    Tobacco Cessation No Change    Warm-up and Cool-down Performed as group-led instruction    Resistance Training Performed Yes    VAD Patient? No    PAD/SET Patient? No      Pain Assessment   Currently in Pain? No/denies    Multiple Pain Sites No             Capillary Blood Glucose: No results found for this or any previous visit (from the past 24 hour(s)).    Social History   Tobacco Use  Smoking Status Former   Packs/day: 3.00   Years: 45.00   Total pack years: 135.00   Types: Cigarettes   Quit date: 06/25/2013   Years since quitting: 8.8   Passive exposure: Never  Smokeless Tobacco Former   Quit date: 06/25/1976    Goals Met:  Independence with exercise equipment Exercise tolerated well No report of concerns or symptoms today Strength training completed today  Goals Unmet:  Not Applicable  Comments: Service time is from 1019 to 1145.    Dr. Rodman Pickle is Medical Director for Pulmonary Rehab at [facility].

## 2022-04-28 ENCOUNTER — Encounter (HOSPITAL_COMMUNITY)
Admission: RE | Admit: 2022-04-28 | Discharge: 2022-04-28 | Disposition: A | Discharge status: Home / Self Care | Payer: No Typology Code available for payment source | Source: Ambulatory Visit | Attending: Pulmonary Disease | Admitting: Pulmonary Disease

## 2022-04-28 DIAGNOSIS — J449 Chronic obstructive pulmonary disease, unspecified: Secondary | ICD-10-CM

## 2022-04-28 NOTE — Progress Notes (Signed)
Daily Session Note  Patient Details  Name: Sean [name] MRN: [id number] Date of Birth: April 24, 1951 Referring Provider:   [name] Pulmonary Rehab Walk Test from 03/21/2022 in [place]  Referring Provider [name]  [[name]]       Encounter Date: 04/28/2022  Check In:  Session Check In - 04/28/22 1124       Check-In   Supervising physician immediately available to respond to emergencies [facility] MD immediately available    Physician(s) Dr, Erskine Emery    Location [facility]    Staff Present Elmon Else, MS, ACSM-CEP, Exercise Physiologist;Randi Yevonne Pax, ACSM-CEP, Exercise Physiologist;Lisa Ysidro Evert, RN    Virtual Visit No    Medication changes reported     No    Fall or balance concerns reported    No    Tobacco Cessation No Change    Warm-up and Cool-down Performed as group-led instruction    Resistance Training Performed Yes    VAD Patient? No    PAD/SET Patient? No      Pain Assessment   Currently in Pain? No/denies    Multiple Pain Sites No             Capillary Blood Glucose: No results found for this or any previous visit (from the past 24 hour(s)).    Social History   Tobacco Use  Smoking Status Former   Packs/day: 3.00   Years: 45.00   Total pack years: 135.00   Types: Cigarettes   Quit date: 06/25/2013   Years since quitting: 8.8   Passive exposure: Never  Smokeless Tobacco Former   Quit date: 06/25/1976    Goals Met:  Proper associated with RPD/PD & O2 Sat Independence with exercise equipment Exercise tolerated well No report of concerns or symptoms today Strength training completed today  Goals Unmet:  Not Applicable  Comments: Service time is from 1018 to 1148.    Dr. Rodman Pickle is Medical Director for Pulmonary Rehab at [facility].

## 2022-05-03 ENCOUNTER — Encounter (HOSPITAL_COMMUNITY)
Admission: RE | Admit: 2022-05-03 | Discharge: 2022-05-03 | Disposition: A | Discharge status: Home / Self Care | Payer: No Typology Code available for payment source | Source: Ambulatory Visit | Attending: Pulmonary Disease | Admitting: Pulmonary Disease

## 2022-05-03 VITALS — Wt 225.1 lb

## 2022-05-03 DIAGNOSIS — J449 Chronic obstructive pulmonary disease, unspecified: Secondary | ICD-10-CM | POA: Diagnosis not present

## 2022-05-03 NOTE — Progress Notes (Signed)
Daily Session Note  Patient Details  Name: Sean [name] MRN: [id number] Date of Birth: 04-07-1951 Referring Provider:   [name] Pulmonary Rehab Walk Test from 03/21/2022 in [place]  Referring Provider [name]  [[name]]       Encounter Date: 05/03/2022  Check In:  Session Check In - 05/03/22 1147       Check-In   Supervising physician immediately available to respond to emergencies [facility] - Physician supervision    Physician(s) Dr, Erskine Emery    [facility]    Staff Present Elmon Else, MS, ACSM-CEP, Exercise Physiologist;Randi Olen Cordial BS, ACSM-CEP, Exercise Physiologist;David Lilyan Punt, MS, ACSM-CEP, CCRP, Exercise Physiologist;Carlette Wilber Oliphant, RN, BSN    Virtual Visit No    Medication changes reported     No    Fall or balance concerns reported    No    Tobacco Cessation No Change    Warm-up and Cool-down Performed as group-led instruction    Resistance Training Performed Yes    VAD Patient? No    PAD/SET Patient? No      Pain Assessment   Currently in Pain? No/denies    Multiple Pain Sites No             Capillary Blood Glucose: No results found for this or any previous visit (from the past 24 hour(s)).   Exercise Prescription Changes - 05/03/22 1100       Response to Exercise   Blood Pressure (Admit) 128/70    Blood Pressure (Exercise) 150/82    Blood Pressure (Exit) 138/66    Heart Rate (Admit) 75 bpm    Heart Rate (Exercise) 99 bpm    Heart Rate (Exit) 80 bpm    Oxygen Saturation (Admit) 95 %    Oxygen Saturation (Exercise) 93 %    Oxygen Saturation (Exit) 95 %    Rating of Perceived Exertion (Exercise) 17    Perceived Dyspnea (Exercise) 3    Duration Continue with 30 min of aerobic exercise without signs/symptoms of physical distress.    Intensity THRR unchanged      Progression   Progression Continue to progress workloads to maintain intensity without signs/symptoms of physical distress.       Resistance Training   Training Prescription Yes    Weight blue bands    Reps 10-15    Time 10 Minutes      Oxygen   Oxygen Continuous    Liters 4      NuStep   Level 4    SPM 80    Minutes 15    METs 2.3      Track   Laps 8    Minutes 15    METs 1.93      Oxygen   Maintain Oxygen Saturation 88% or higher             Social History   Tobacco Use  Smoking Status Former   Packs/day: 3.00   Years: 45.00   Total pack years: 135.00   Types: Cigarettes   Quit date: 06/25/2013   Years since quitting: 8.8   Passive exposure: Never  Smokeless Tobacco Former   Quit date: 06/25/1976    Goals Met:  Proper associated with RPD/PD & O2 Sat Independence with exercise equipment Exercise tolerated well No report of concerns or symptoms today Strength training completed today  Goals Unmet:  Not Applicable  Comments: Service time is from 1015 to 1135.    Dr. Rodman Pickle  is Market researcher for Pulmonary Rehab at [facility].

## 2022-05-05 ENCOUNTER — Encounter (HOSPITAL_COMMUNITY): Payer: No Typology Code available for payment source

## 2022-05-05 ENCOUNTER — Encounter (HOSPITAL_COMMUNITY)
Admission: RE | Admit: 2022-05-05 | Discharge: 2022-05-05 | Disposition: A | Discharge status: Home / Self Care | Payer: No Typology Code available for payment source | Source: Ambulatory Visit | Attending: Pulmonary Disease | Admitting: Pulmonary Disease

## 2022-05-05 DIAGNOSIS — J449 Chronic obstructive pulmonary disease, unspecified: Secondary | ICD-10-CM

## 2022-05-05 NOTE — Progress Notes (Signed)
Daily Session Note  Patient Details  Name: Sean [name] MRN: [id number] Date of Birth: 22-Jul-1951 Referring Provider:   [name] Pulmonary Rehab Walk Test from 03/21/2022 in [place]  Referring Provider [name]  [[name]]       Encounter Date: 05/05/2022  Check In:  Session Check In - 05/05/22 1132       Check-In   Supervising physician immediately available to respond to emergencies [facility] - Physician supervision    Physician(s) Erskine Emery    [facility]    Staff Present Maurice Small, RN, BSN;Ramon Dredge, RN, MHA;Samantha [place], RD, Idalia Needle, MS, Exercise Physiologist;Jetta Gilford Rile BS, ACSM-CEP, Exercise Physiologist;Shritha Bresee Yevonne Pax, ACSM-CEP, Exercise Physiologist;Kaylee Rosana Hoes, MS, ACSM-CEP, Exercise Physiologist    Virtual Visit No    Medication changes reported     No    Fall or balance concerns reported    No    Warm-up and Cool-down Performed as group-led instruction    Resistance Training Performed Yes    VAD Patient? No    PAD/SET Patient? No      Pain Assessment   Currently in Pain? No/denies             Capillary Blood Glucose: No results found for this or any previous visit (from the past 24 hour(s)).    Social History   Tobacco Use  Smoking Status Former   Packs/day: 3.00   Years: 45.00   Total pack years: 135.00   Types: Cigarettes   Quit date: 06/25/2013   Years since quitting: 8.8   Passive exposure: Never  Smokeless Tobacco Former   Quit date: 06/25/1976    Goals Met:  Independence with exercise equipment Exercise tolerated well No report of concerns or symptoms today Strength training completed today  Goals Unmet:  Not Applicable  Comments: Service time is from 1011 to 1146.    Dr. Rodman Pickle is Medical Director for Pulmonary Rehab at [facility].

## 2022-05-10 ENCOUNTER — Encounter (HOSPITAL_COMMUNITY)
Admission: RE | Admit: 2022-05-10 | Discharge: 2022-05-10 | Disposition: A | Discharge status: Home / Self Care | Payer: No Typology Code available for payment source | Source: Ambulatory Visit | Attending: Pulmonary Disease | Admitting: Pulmonary Disease

## 2022-05-10 ENCOUNTER — Encounter (HOSPITAL_COMMUNITY): Payer: No Typology Code available for payment source

## 2022-05-10 DIAGNOSIS — J449 Chronic obstructive pulmonary disease, unspecified: Secondary | ICD-10-CM | POA: Diagnosis not present

## 2022-05-10 NOTE — Progress Notes (Signed)
Daily Session Note  Patient Details  Name: Sean [name] MRN: [id number] Date of Birth: 02-27-1951 Referring Provider:   April [name] Pulmonary Rehab Walk [name] from 03/21/2022 in [place]  Referring Provider [name]  [[name]]       Encounter Date: 05/10/2022  Check In:  Session Check In - 05/10/22 1130       Check-In   Supervising physician immediately available to respond to emergencies [facility] - Physician supervision    Physician(s) Dr.Paige Carlis Abbott    Location [facility]    Staff Present Sean [name], Sean Fears, MS, ACSM-CEP, Exercise Physiologist;Sean [name], ACSM-CEP, Exercise Physiologist    Virtual Visit No    Medication changes reported     No    Fall or balance concerns reported    No    Tobacco Cessation No Change    Warm-up and Cool-down Performed as group-led instruction    Resistance Training Performed No    VAD Patient? No    PAD/SET Patient? No      Pain Assessment   Currently in Pain? No/denies    Multiple Pain Sites No             Capillary Blood Glucose: No results found for this or any previous visit (from the past 24 hour(s)).    Social History   Tobacco Use  Smoking Status Former   Packs/day: 3.00   Years: 45.00   Total pack years: 135.00   Types: Cigarettes   Quit date: 06/25/2013   Years since quitting: 8.8   Passive exposure: Never  Smokeless Tobacco Former   Quit date: 06/25/1976    Goals Met:  Independence with exercise equipment Improved SOB with ADL's Using PLB without cueing & demonstrates good technique Exercise tolerated well No report of concerns or symptoms today Strength training completed today  Goals Unmet:  Not Applicable  Comments: Service time is from 1015 to 1146.    Dr. Rodman Pickle is Medical Director for Pulmonary Rehab at [facility].

## 2022-05-11 NOTE — Progress Notes (Signed)
Pulmonary Individual Treatment Plan  Patient Details  Name: Sean [name] MRN: [id number] Date of Birth: 1951-07-12 Referring Provider:   [name] Pulmonary Rehab Walk Test from 03/21/2022 in [place]  Referring Provider [name]  [[name]]       Initial Encounter Date:  Flowsheet Row Pulmonary Rehab Walk Test from 03/21/2022 in [place]  Date 03/21/22       Visit Diagnosis: Chronic obstructive pulmonary disease, unspecified COPD type ([facility])  Patient's Home Medications on Admission:   Current Outpatient Medications:    albuterol (PROVENTIL HFA;VENTOLIN HFA) 108 (90 BASE) MCG/ACT inhaler, Inhale 2 puffs into the lungs every 6 (six) hours as needed for wheezing or shortness of breath., Disp: , Rfl:    albuterol (PROVENTIL) (2.5 MG/3ML) 0.083% nebulizer solution, Take 2.5 mg by nebulization every 6 (six) hours as needed for wheezing or shortness of breath., Disp: , Rfl:    amLODipine (NORVASC) 2.5 MG tablet, Take 2.5 mg by mouth daily., Disp: , Rfl:    amoxicillin (AMOXIL) 500 MG capsule, Take 2 capsules (1,000 mg total) by mouth 2 (two) times daily., Disp: 120 capsule, Rfl: 1   aspirin EC 81 MG tablet, Take 81 mg by mouth at bedtime. Swallow whole., Disp: , Rfl:    benazepril (LOTENSIN) 10 MG tablet, Take 1 tablet (10 mg total) by mouth at bedtime., Disp: 90 tablet, Rfl: 3   carboxymethylcellulose (REFRESH PLUS) 0.5 % SOLN, Place 1 drop into both eyes 3 (three) times daily., Disp: , Rfl:    Carboxymethylcellulose Sodium (THERATEARS PF OP), Place 2 drops into both eyes 3 (three) times daily., Disp: , Rfl:    cholecalciferol (VITAMIN D) 1000 UNITS tablet, Take 1,000 Units by mouth 2 (two) times daily., Disp: , Rfl:    Cholecalciferol (VITAMIN D3) 25 MCG (1000 UT) CAPS, Take 1,000 Units by mouth 2 (two) times daily., Disp: , Rfl:    cilostazol (PLETAL) 100 MG tablet, Take 100 mg by mouth 2 (two) times daily., Disp: , Rfl:     erythromycin ophthalmic ointment, Apply to eye. (Patient not taking: Reported on 03/21/2022), Disp: , Rfl:    ferrous sulfate 325 (65 FE) MG tablet, Take 1 tablet by mouth daily., Disp: , Rfl:    folic acid (FOLVITE) 1 MG tablet, Take 1 mg by mouth daily., Disp: , Rfl:    hydrochlorothiazide (HYDRODIURIL) 25 MG tablet, Take 0.5 tablets by mouth daily., Disp: , Rfl:    hydrocortisone (ANUSOL-HC) 2.5 % rectal cream, Place 1 application rectally 3 (three) times daily as needed for hemorrhoids or anal itching., Disp: , Rfl:    hydrocortisone 2.5 % cream, Apply 1 application topically 3 (three) times daily as needed (itching). rectally, Disp: , Rfl:    LACTOBACILLUS PO, Take 2 capsules by mouth daily., Disp: , Rfl:    Magnesium Oxide 420 MG TABS, Take 420 mg by mouth every other day., Disp: , Rfl:    meloxicam (MOBIC) 15 MG tablet, Take 15 mg by mouth daily., Disp: , Rfl:    MENTHOL-METHYL SALICYLATE EX, Apply 1 application topically 2 (two) times daily as needed (for knee pain)., Disp: , Rfl:    metoprolol (TOPROL-XL) 200 MG 24 hr tablet, Take 200 mg by mouth daily., Disp: , Rfl:    Multiple Vitamins-Minerals (MULTIVITAMIN WITH MINERALS) tablet, Take 1 tablet by mouth daily., Disp: , Rfl:    niacin 100 MG tablet, Take 100 mg by mouth in the morning., Disp: , Rfl:  Omega-3 Fatty Acids (FISH OIL) 1000 MG CAPS, Take 1,000 mg by mouth daily., Disp: , Rfl:    pantoprazole (PROTONIX) 40 MG tablet, Take 40 mg by mouth 2 (two) times daily., Disp: , Rfl:    rosuvastatin (CRESTOR) 20 MG tablet, Take 10 mg by mouth daily., Disp: , Rfl:    spironolactone (ALDACTONE) 25 MG tablet, Take 25 mg by mouth daily., Disp: , Rfl:    thiamine 100 MG tablet, Take 100 mg by mouth daily., Disp: , Rfl:   Current Facility-Administered Medications:    0.9 %  sodium chloride infusion, 250 mL, Intravenous, PRN, Baglia, Corrina, PA-C   alum & mag hydroxide-simeth (MAALOX/MYLANTA) 200-200-20 MG/5ML suspension 15-30 mL, 15-30 mL,  Oral, Q2H PRN, Baglia, Corrina, PA-C   docusate sodium (COLACE) capsule 100 mg, 100 mg, Oral, BID, Baglia, Corrina, PA-C   guaiFENesin-dextromethorphan (ROBITUSSIN DM) 100-10 MG/5ML syrup 15 mL, 15 mL, Oral, Q4H PRN, Baglia, Corrina, PA-C   heparin injection 5,000 Units, 5,000 Units, Subcutaneous, Q8H, Baglia, Corrina, PA-C   hydrALAZINE (APRESOLINE) injection 5 mg, 5 mg, Intravenous, Q20 Min PRN, Baglia, Corrina, PA-C   labetalol (NORMODYNE) injection 10 mg, 10 mg, Intravenous, Q10 min PRN, Baglia, Corrina, PA-C   metoprolol tartrate (LOPRESSOR) injection 2-5 mg, 2-5 mg, Intravenous, Q2H PRN, Baglia, Corrina, PA-C   ondansetron (ZOFRAN) injection 4 mg, 4 mg, Intravenous, Q6H PRN, Baglia, Corrina, PA-C   phenol (CHLORASEPTIC) mouth spray 1 spray, 1 spray, Mouth/Throat, PRN, Baglia, Corrina, PA-C   senna-docusate (Senokot-S) tablet 1 tablet, 1 tablet, Oral, QHS PRN, Baglia, Corrina, PA-C   sodium chloride flush (NS) 0.9 % injection 3 mL, 3 mL, Intravenous, Q12H, Baglia, Corrina, PA-C   sodium chloride flush (NS) 0.9 % injection 3 mL, 3 mL, Intravenous, PRN, Baglia, Corrina, PA-C  Facility-Administered Medications Ordered in Other Encounters:    acetaminophen (TYLENOL) tablet 325-650 mg, 325-650 mg, Oral, Q4H PRN **OR** acetaminophen (TYLENOL) suppository 320-640 mg, 320-640 mg, Rectal, Q4H PRN, Arvilla Market, Corrina, PA-C  Past Medical History: Past Medical History:  Diagnosis Date   Arthritis    COPD (chronic obstructive pulmonary disease) (HCC)    GERD (gastroesophageal reflux disease)    Hyperlipidemia    Hypertension    Sleep apnea     Tobacco Use: Social History   Tobacco Use  Smoking Status Former   Packs/day: 3.00   Years: 45.00   Total pack years: 135.00   Types: Cigarettes   Quit date: 06/25/2013   Years since quitting: 8.8   Passive exposure: Never  Smokeless Tobacco Former   Quit date: 06/25/1976    Labs: Review Flowsheet       Latest Ref Rng & Units 09/28/2020  11/04/2020 11/05/2020  Labs for ITP Cardiac and Pulmonary Rehab  Cholestrol 0 - 200 mg/dL - - 84   LDL (calc) 0 - 99 mg/dL - - 14   HDL-C >40 mg/dL - - 53   Trlycerides <150 mg/dL - - 83   PH, Arterial 7.350 - 7.450 - 7.329  -  PCO2 arterial 32.0 - 48.0 mmHg - 49.1  -  Bicarbonate 20.0 - 28.0 mmol/L - 25.9  -  TCO2 22 - 32 mmol/L $RemoveB'28  27  26  '[id number]$ -  O2 Saturation % - 98.0  -    Capillary Blood Glucose: No results found for: "GLUCAP"   Pulmonary Assessment Scores:  Pulmonary Assessment Scores     Row Name 03/21/22 0909 05/10/22 1547       ADL UCSD   ADL Phase Entry  Exit    SOB Score total 58 63      CAT Score   CAT Score 13 16      mMRC Score   mMRC Score 4 --            UCSD: Self-administered rating of dyspnea associated with activities of daily living (ADLs) 6-point scale (0 = "not at all" to 5 = "maximal or unable to do because of breathlessness")  Scoring Scores range from 0 to 120.  Minimally important difference is 5 units  CAT: CAT can identify the health impairment of COPD patients and is better correlated with disease progression.  CAT has a scoring range of zero to 40. The CAT score is classified into four groups of low (less than 10), medium (10 - 20), high (21-30) and very high (31-40) based on the impact level of disease on health status. A CAT score over 10 suggests significant symptoms.  A worsening CAT score could be explained by an exacerbation, poor medication adherence, poor inhaler technique, or progression of COPD or comorbid conditions.  CAT MCID is 2 points  mMRC: mMRC (Modified Medical Research Council) Dyspnea Scale is used to assess the degree of baseline functional disability in patients of respiratory disease due to dyspnea. No minimal important difference is established. A decrease in score of 1 point or greater is considered a positive change.   Pulmonary Function Assessment:  Pulmonary Function Assessment - 03/21/22 0911       Breath    Bilateral Breath Sounds Decreased;Clear;Expiratory    Shortness of Breath Fear of Shortness of Breath;Limiting activity;Yes             Exercise Target Goals: Exercise Program Goal: Individual exercise prescription set using results from initial 6 min walk test and THRR while considering  patient's activity barriers and safety.   Exercise Prescription Goal: Initial exercise prescription builds to 30-45 minutes a day of aerobic activity, 2-3 days per week.  Home exercise guidelines will be given to patient during program as part of exercise prescription that the participant will acknowledge.  Activity Barriers & Risk Stratification:  Activity Barriers & Cardiac Risk Stratification - 03/21/22 0901       Activity Barriers & Cardiac Risk Stratification   Activity Barriers Joint Problems;Arthritis   bilateral knee pain            6 Minute Walk:  6 Minute Walk     Row Name 03/21/22 1006         6 Minute Walk   Phase Initial     Distance 1052 feet     Walk Time 6 minutes     # of Rest Breaks 2  1:32-1:44, 2:50-4:00     MPH 1.99     METS 2.37     RPE 19     Perceived Dyspnea  3     VO2 Peak 8.3     Symptoms No     Resting HR 82 bpm     Resting BP 134/72     Resting Oxygen Saturation  90 %     Exercise Oxygen Saturation  during 6 min walk 88 %     Max Ex. HR 102 bpm     Max Ex. BP 184/80     2 Minute Post BP 174/80  4 min post: 140/78       Interval HR   1 Minute HR 94     2 Minute HR 97     3  Minute HR 102     4 Minute HR 98     5 Minute HR 101     6 Minute HR 94     2 Minute Post HR 87     Interval Heart Rate? Yes       Interval Oxygen   Interval Oxygen? Yes     Baseline Oxygen Saturation % 90 %     1 Minute Oxygen Saturation % 89 %     1 Minute Liters of Oxygen 0 L     2 Minute Oxygen Saturation % 88 %     2 Minute Liters of Oxygen 0 L     3 Minute Oxygen Saturation % 90 %     3 Minute Liters of Oxygen 0 L     4 Minute Oxygen Saturation % 91 %      4 Minute Liters of Oxygen 0 L     5 Minute Oxygen Saturation % 89 %     5 Minute Liters of Oxygen 0 L     6 Minute Oxygen Saturation % 90 %     6 Minute Liters of Oxygen 0 L     2 Minute Post Oxygen Saturation % 92 %     2 Minute Post Liters of Oxygen 0 L              Oxygen Initial Assessment:  Oxygen Initial Assessment - 03/21/22 0900       Home Oxygen   Home Oxygen Device None    Sleep Oxygen Prescription None    Home Exercise Oxygen Prescription None    Home Resting Oxygen Prescription None      Initial 6 min Walk   Oxygen Used None      Program Oxygen Prescription   Program Oxygen Prescription None      Intervention   Short Term Goals To learn and exhibit compliance with exercise, home and travel O2 prescription;To learn and understand importance of maintaining oxygen saturations>88%;To learn and demonstrate proper use of respiratory medications;To learn and understand importance of monitoring SPO2 with pulse oximeter and demonstrate accurate use of the pulse oximeter.;To learn and demonstrate proper pursed lip breathing techniques or other breathing techniques.     Long  Term Goals Exhibits compliance with exercise, home  and travel O2 prescription;Verbalizes importance of monitoring SPO2 with pulse oximeter and return demonstration;Maintenance of O2 saturations>88%;Exhibits proper breathing techniques, such as pursed lip breathing or other method taught during program session;Compliance with respiratory medication;Demonstrates proper use of MDI's             Oxygen Re-Evaluation:  Oxygen Re-Evaluation     Row Name 03/22/22 0910 04/14/22 0809 05/09/22 0742         Program Oxygen Prescription   Program Oxygen Prescription None Continuous Continuous     Liters per minute -- 1 4     Comments -- SPO2: 94% 1L on Nustep and 88% 1L on track. Will consider increasing liter flow on track. Using 4L according to MD prx       Home Oxygen   Home Oxygen Device None None   [facility] None  [facility]     Sleep Oxygen Prescription None None None     Home Exercise Oxygen Prescription None None None     Home Resting Oxygen Prescription None None None       Goals/Expected Outcomes   Short Term  Goals To learn and exhibit compliance with exercise, home and travel O2 prescription;To learn and understand importance of maintaining oxygen saturations>88%;To learn and demonstrate proper use of respiratory medications;To learn and understand importance of monitoring SPO2 with pulse oximeter and demonstrate accurate use of the pulse oximeter.;To learn and demonstrate proper pursed lip breathing techniques or other breathing techniques.  To learn and exhibit compliance with exercise, home and travel O2 prescription;To learn and understand importance of maintaining oxygen saturations>88%;To learn and demonstrate proper use of respiratory medications;To learn and understand importance of monitoring SPO2 with pulse oximeter and demonstrate accurate use of the pulse oximeter.;To learn and demonstrate proper pursed lip breathing techniques or other breathing techniques.  To learn and exhibit compliance with exercise, home and travel O2 prescription;To learn and understand importance of maintaining oxygen saturations>88%;To learn and demonstrate proper use of respiratory medications;To learn and understand importance of monitoring SPO2 with pulse oximeter and demonstrate accurate use of the pulse oximeter.;To learn and demonstrate proper pursed lip breathing techniques or other breathing techniques.      Long  Term Goals Exhibits compliance with exercise, home  and travel O2 prescription;Verbalizes importance of monitoring SPO2 with pulse oximeter and return demonstration;Maintenance of O2 saturations>88%;Exhibits proper breathing techniques, such as pursed lip breathing or other method taught during program session;Compliance with respiratory  medication;Demonstrates proper use of MDI's Exhibits compliance with exercise, home  and travel O2 prescription;Verbalizes importance of monitoring SPO2 with pulse oximeter and return demonstration;Maintenance of O2 saturations>88%;Exhibits proper breathing techniques, such as pursed lip breathing or other method taught during program session;Compliance with respiratory medication;Demonstrates proper use of MDI's Exhibits compliance with exercise, home  and travel O2 prescription;Verbalizes importance of monitoring SPO2 with pulse oximeter and return demonstration;Maintenance of O2 saturations>88%;Exhibits proper breathing techniques, such as pursed lip breathing or other method taught during program session;Compliance with respiratory medication;Demonstrates proper use of MDI's     Goals/Expected Outcomes Compliance and understanding of oxygen saturation monitoring and breathing techniques to decrease shortness of breath. Compliance and understanding of oxygen saturation monitoring and breathing techniques to decrease shortness of breath. Compliance and understanding of oxygen saturation monitoring and breathing techniques to decrease shortness of breath.              Oxygen Discharge (Final Oxygen Re-Evaluation):  Oxygen Re-Evaluation - 05/09/22 0742       Program Oxygen Prescription   Program Oxygen Prescription Continuous    Liters per minute 4    Comments Using 4L according to MD prx      Home Oxygen   Home Oxygen Device None   [facility]   Sleep Oxygen Prescription None    Home Exercise Oxygen Prescription None    Home Resting Oxygen Prescription None      Goals/Expected Outcomes   Short Term Goals To learn and exhibit compliance with exercise, home and travel O2 prescription;To learn and understand importance of maintaining oxygen saturations>88%;To learn and demonstrate proper use of respiratory medications;To learn and understand importance of monitoring SPO2  with pulse oximeter and demonstrate accurate use of the pulse oximeter.;To learn and demonstrate proper pursed lip breathing techniques or other breathing techniques.     Long  Term Goals Exhibits compliance with exercise, home  and travel O2 prescription;Verbalizes importance of monitoring SPO2 with pulse oximeter and return demonstration;Maintenance of O2 saturations>88%;Exhibits proper breathing techniques, such as pursed lip breathing or other method taught during program session;Compliance with respiratory medication;Demonstrates proper use of MDI's  Goals/Expected Outcomes Compliance and understanding of oxygen saturation monitoring and breathing techniques to decrease shortness of breath.             Initial Exercise Prescription:  Initial Exercise Prescription - 03/21/22 1000       Date of Initial Exercise RX and Referring Provider   Date 03/21/22    Referring Provider Pat Kocher   Expected Discharge Date 05/26/22      NuStep   Level 1    SPM 80    Minutes 15      Track   Minutes 15    METs 2.37      Prescription Details   Frequency (times per week) 2    Duration Progress to 30 minutes of continuous aerobic without signs/symptoms of physical distress      Intensity   THRR 40-80% of Max Heartrate 60-121    Ratings of Perceived Exertion 11-13    Perceived Dyspnea 0-4      Progression   Progression Continue progressive overload as per policy without signs/symptoms or physical distress.      Resistance Training   Training Prescription Yes    Weight blue bands    Reps 10-15             Perform Capillary Blood Glucose checks as needed.  Exercise Prescription Changes:   Exercise Prescription Changes     Row Name 04/05/22 1100 04/19/22 1200 05/03/22 1100         Response to Exercise   Blood Pressure (Admit) 130/70 154/64 128/70     Blood Pressure (Exercise) 150/70 154/86 150/82     Blood Pressure (Exit) 132/60 150/82 138/66     Heart Rate (Admit)  79 bpm 73 bpm 75 bpm     Heart Rate (Exercise) 88 bpm 95 bpm 99 bpm     Heart Rate (Exit) 77 bpm 95 bpm 80 bpm     Oxygen Saturation (Admit) 91 % 95 % 95 %     Oxygen Saturation (Exercise) 91 % 92 % 93 %     Oxygen Saturation (Exit) 94 % 98 % 95 %     Rating of Perceived Exertion (Exercise) 15 15 17      Perceived Dyspnea (Exercise) 3 3 3      Symptoms none -- --     Duration Continue with 30 min of aerobic exercise without signs/symptoms of physical distress. Continue with 30 min of aerobic exercise without signs/symptoms of physical distress. Continue with 30 min of aerobic exercise without signs/symptoms of physical distress.     Intensity THRR unchanged THRR unchanged THRR unchanged       Progression   Progression Continue to progress workloads to maintain intensity without signs/symptoms of physical distress. Continue to progress workloads to maintain intensity without signs/symptoms of physical distress. Continue to progress workloads to maintain intensity without signs/symptoms of physical distress.     Average METs 1.7 -- --       Resistance Training   Training Prescription Yes Yes Yes     Weight blue bands blue bands blue bands     Reps 10-15 10-15 10-15     Time 10 Minutes 10 Minutes 10 Minutes       Oxygen   Oxygen Continuous Continuous Continuous     Liters 1 2 4        NuStep   Level 2 3 4      SPM 80 80 80     Minutes 15 15 15  METs -- -- 2.3       Track   Laps -- 7 8     Minutes $Remove'15 15 15     'DVGtwrK$ METs 1.7 1.7 1.93       Oxygen   Maintain Oxygen Saturation -- -- 88% or higher              Exercise Comments:   Exercise Comments     Row Name 03/29/22 1225 04/19/22 1607         Exercise Comments Pt completed first day of exercise. He exercised for 15 min on the Nustep and track. Joas 1.8 METs at level 1 on the Nustep and 1.81 METs on the track. His O2 dropped on the track and he was places on O2. Jasaun performed the warmup and cooldown standing without  limitations. Discussed METs and how to increase METs with him. Completed home exercise plan. Bonifacio is not currently exercising at home. I encouraged him to try walking or exercising at a fitness center. I gave him resources on local fitness center. I discussed with Rayhan exercising 1 non-rehab day/wk for 30 min/day. I mentioned that Clennon can divide his time into 2x15 min. He agreed with my recommendations. Joffre seems motivated to exercise at home.               Exercise Goals and Review:   Exercise Goals     Row Name 03/21/22 0901 03/22/22 0908 04/14/22 0805 05/09/22 0739       Exercise Goals   Increase Physical Activity Yes Yes Yes Yes    Intervention Provide advice, education, support and counseling about physical activity/exercise needs.;Develop an individualized exercise prescription for aerobic and resistive training based on initial evaluation findings, risk stratification, comorbidities and participant's personal goals. Provide advice, education, support and counseling about physical activity/exercise needs.;Develop an individualized exercise prescription for aerobic and resistive training based on initial evaluation findings, risk stratification, comorbidities and participant's personal goals. Provide advice, education, support and counseling about physical activity/exercise needs.;Develop an individualized exercise prescription for aerobic and resistive training based on initial evaluation findings, risk stratification, comorbidities and participant's personal goals. Provide advice, education, support and counseling about physical activity/exercise needs.;Develop an individualized exercise prescription for aerobic and resistive training based on initial evaluation findings, risk stratification, comorbidities and participant's personal goals.    Expected Outcomes Short Term: Attend rehab on a regular basis to increase amount of physical activity.;Long Term: Add in home exercise to make  exercise part of routine and to increase amount of physical activity.;Long Term: Exercising regularly at least 3-5 days a week. Short Term: Attend rehab on a regular basis to increase amount of physical activity.;Long Term: Add in home exercise to make exercise part of routine and to increase amount of physical activity.;Long Term: Exercising regularly at least 3-5 days a week. Short Term: Attend rehab on a regular basis to increase amount of physical activity.;Long Term: Add in home exercise to make exercise part of routine and to increase amount of physical activity.;Long Term: Exercising regularly at least 3-5 days a week. Short Term: Attend rehab on a regular basis to increase amount of physical activity.;Long Term: Add in home exercise to make exercise part of routine and to increase amount of physical activity.;Long Term: Exercising regularly at least 3-5 days a week.    Increase Strength and Stamina Yes Yes Yes Yes    Intervention Develop an individualized exercise prescription for aerobic and resistive training based on initial evaluation findings, risk stratification, comorbidities  and participant's personal goals.;Provide advice, education, support and counseling about physical activity/exercise needs. Develop an individualized exercise prescription for aerobic and resistive training based on initial evaluation findings, risk stratification, comorbidities and participant's personal goals.;Provide advice, education, support and counseling about physical activity/exercise needs. Develop an individualized exercise prescription for aerobic and resistive training based on initial evaluation findings, risk stratification, comorbidities and participant's personal goals.;Provide advice, education, support and counseling about physical activity/exercise needs. Develop an individualized exercise prescription for aerobic and resistive training based on initial evaluation findings, risk stratification, comorbidities  and participant's personal goals.;Provide advice, education, support and counseling about physical activity/exercise needs.    Expected Outcomes Short Term: Increase workloads from initial exercise prescription for resistance, speed, and METs.;Short Term: Perform resistance training exercises routinely during rehab and add in resistance training at home;Long Term: Improve cardiorespiratory fitness, muscular endurance and strength as measured by increased METs and functional capacity (6MWT) Short Term: Increase workloads from initial exercise prescription for resistance, speed, and METs.;Short Term: Perform resistance training exercises routinely during rehab and add in resistance training at home;Long Term: Improve cardiorespiratory fitness, muscular endurance and strength as measured by increased METs and functional capacity (6MWT) Short Term: Increase workloads from initial exercise prescription for resistance, speed, and METs.;Short Term: Perform resistance training exercises routinely during rehab and add in resistance training at home;Long Term: Improve cardiorespiratory fitness, muscular endurance and strength as measured by increased METs and functional capacity (6MWT) Short Term: Increase workloads from initial exercise prescription for resistance, speed, and METs.;Short Term: Perform resistance training exercises routinely during rehab and add in resistance training at home;Long Term: Improve cardiorespiratory fitness, muscular endurance and strength as measured by increased METs and functional capacity (6MWT)    Able to understand and use rate of perceived exertion (RPE) scale Yes Yes Yes Yes    Intervention Provide education and explanation on how to use RPE scale Provide education and explanation on how to use RPE scale Provide education and explanation on how to use RPE scale Provide education and explanation on how to use RPE scale    Expected Outcomes Short Term: Able to use RPE daily in rehab to  express subjective intensity level;Long Term:  Able to use RPE to guide intensity level when exercising independently Short Term: Able to use RPE daily in rehab to express subjective intensity level;Long Term:  Able to use RPE to guide intensity level when exercising independently Short Term: Able to use RPE daily in rehab to express subjective intensity level;Long Term:  Able to use RPE to guide intensity level when exercising independently Short Term: Able to use RPE daily in rehab to express subjective intensity level;Long Term:  Able to use RPE to guide intensity level when exercising independently    Able to understand and use Dyspnea scale Yes Yes Yes Yes    Intervention Provide education and explanation on how to use Dyspnea scale Provide education and explanation on how to use Dyspnea scale Provide education and explanation on how to use Dyspnea scale Provide education and explanation on how to use Dyspnea scale    Expected Outcomes Short Term: Able to use Dyspnea scale daily in rehab to express subjective sense of shortness of breath during exertion;Long Term: Able to use Dyspnea scale to guide intensity level when exercising independently Short Term: Able to use Dyspnea scale daily in rehab to express subjective sense of shortness of breath during exertion;Long Term: Able to use Dyspnea scale to guide intensity level when exercising independently Short Term: Able  to use Dyspnea scale daily in rehab to express subjective sense of shortness of breath during exertion;Long Term: Able to use Dyspnea scale to guide intensity level when exercising independently Short Term: Able to use Dyspnea scale daily in rehab to express subjective sense of shortness of breath during exertion;Long Term: Able to use Dyspnea scale to guide intensity level when exercising independently    Knowledge and understanding of Target Heart Rate Range (THRR) Yes Yes Yes Yes    Intervention Provide education and explanation of THRR  including how the numbers were predicted and where they are located for reference Provide education and explanation of THRR including how the numbers were predicted and where they are located for reference Provide education and explanation of THRR including how the numbers were predicted and where they are located for reference Provide education and explanation of THRR including how the numbers were predicted and where they are located for reference    Expected Outcomes Short Term: Able to state/look up THRR;Long Term: Able to use THRR to govern intensity when exercising independently;Short Term: Able to use daily as guideline for intensity in rehab Short Term: Able to state/look up THRR;Long Term: Able to use THRR to govern intensity when exercising independently;Short Term: Able to use daily as guideline for intensity in rehab Short Term: Able to state/look up THRR;Long Term: Able to use THRR to govern intensity when exercising independently;Short Term: Able to use daily as guideline for intensity in rehab Short Term: Able to state/look up THRR;Long Term: Able to use THRR to govern intensity when exercising independently;Short Term: Able to use daily as guideline for intensity in rehab    Understanding of Exercise Prescription Yes Yes Yes Yes    Intervention Provide education, explanation, and written materials on patient's individual exercise prescription Provide education, explanation, and written materials on patient's individual exercise prescription Provide education, explanation, and written materials on patient's individual exercise prescription Provide education, explanation, and written materials on patient's individual exercise prescription    Expected Outcomes Short Term: Able to explain program exercise prescription;Long Term: Able to explain home exercise prescription to exercise independently Short Term: Able to explain program exercise prescription;Long Term: Able to explain home exercise  prescription to exercise independently Short Term: Able to explain program exercise prescription;Long Term: Able to explain home exercise prescription to exercise independently Short Term: Able to explain program exercise prescription;Long Term: Able to explain home exercise prescription to exercise independently             Exercise Goals Re-Evaluation :  Exercise Goals Re-Evaluation     Row Name 03/22/22 0909 04/14/22 0805 05/09/22 0739         Exercise Goal Re-Evaluation   Exercise Goals Review Increase Physical Activity;Increase Strength and Stamina;Able to understand and use rate of perceived exertion (RPE) scale;Able to understand and use Dyspnea scale;Knowledge and understanding of Target Heart Rate Range (THRR);Understanding of Exercise Prescription Increase Physical Activity;Increase Strength and Stamina;Able to understand and use rate of perceived exertion (RPE) scale;Able to understand and use Dyspnea scale;Knowledge and understanding of Target Heart Rate Range (THRR);Understanding of Exercise Prescription Increase Physical Activity;Increase Strength and Stamina;Able to understand and use rate of perceived exertion (RPE) scale;Able to understand and use Dyspnea scale;Knowledge and understanding of Target Heart Rate Range (THRR);Understanding of Exercise Prescription     Comments Dutch is scheduled to begin exercise next week. Will continue to monitor and progress as able. Jones has completed 5 exercise sessions. He exercises for 15 min on the Nustep  and track. He averages 2.0 METs at level 2 on the Nustep and 1.7 METs on the track. He has increases his workload on the Nustep once. He tolerated this well. Will consider progressing him again. Johncarlo is deconditioned. His METs on the Nustep and the track have remained relatively the same. Will discuss pacing strategies for the track. He performs the warmup and cooldown standing mostly. He does alternating leg raises during the squats. Will  continue to monitor and progress as able. Haris has completed 12 exercise sessions. He exercises for 15 min on the Nustep and track. Cayson averages 2.3 METs at level 4 on the Nustep and 1.7 METs on the track. Jonuel has increased his workload on the Nustep as he tolerated progression well. He laps on the track vary from 6-8 dependent on his shortness of breath. Desjuan is very motivated to exercise and improve his functional capacity. We have discussed home exercise. Davier was not currently exercising when I first mentioned home exercise. He is making an effort now to exercise at home. Will continue to montitor and progress as able.     Expected Outcomes Through exercise at rehab and home, the patient will decrease shortness of breath with daily activities and feel confident in carrying out an exercise regimen at home. Through exercise at rehab and home, the patient will decrease shortness of breath with daily activities and feel confident in carrying out an exercise regimen at home. Through exercise at rehab and home, the patient will decrease shortness of breath with daily activities and feel confident in carrying out an exercise regimen at home.              Discharge Exercise Prescription (Final Exercise Prescription Changes):  Exercise Prescription Changes - 05/03/22 1100       Response to Exercise   Blood Pressure (Admit) 128/70    Blood Pressure (Exercise) 150/82    Blood Pressure (Exit) 138/66    Heart Rate (Admit) 75 bpm    Heart Rate (Exercise) 99 bpm    Heart Rate (Exit) 80 bpm    Oxygen Saturation (Admit) 95 %    Oxygen Saturation (Exercise) 93 %    Oxygen Saturation (Exit) 95 %    Rating of Perceived Exertion (Exercise) 17    Perceived Dyspnea (Exercise) 3    Duration Continue with 30 min of aerobic exercise without signs/symptoms of physical distress.    Intensity THRR unchanged      Progression   Progression Continue to progress workloads to maintain intensity without  signs/symptoms of physical distress.      Resistance Training   Training Prescription Yes    Weight blue bands    Reps 10-15    Time 10 Minutes      Oxygen   Oxygen Continuous    Liters 4      NuStep   Level 4    SPM 80    Minutes 15    METs 2.3      Track   Laps 8    Minutes 15    METs 1.93      Oxygen   Maintain Oxygen Saturation 88% or higher             Nutrition:  Target Goals: Understanding of nutrition guidelines, daily intake of sodium '1500mg'$ , cholesterol '200mg'$ , calories 30% from fat and 7% or less from saturated fats, daily to have 5 or more servings of fruits and vegetables.  Biometrics:    Nutrition Therapy Plan and  Nutrition Goals:  Nutrition Therapy & Goals - 04/26/22 1122       Nutrition Therapy   Diet Heart Healthy Diet      Personal Nutrition Goals   Nutrition Goal Patient to choose a daily variety of fruits,vegetables, whole grains, lean protein/plant protein, nonfat dairy as part of heart healthy lifestyle    Personal Goal #2 Patient to understand strategies for weight loss of 0.5-2.0# per week.    Personal Goal #3 Patient to identify and limit daily intake of saturated fat, trans fat, sodium, and refined carbohydrates.    Comments Goals in progress. Patient reports motivation to lose weight. He reports making healthier choices at snacks and meals and interest in calorie controlled meal options (frozen meals, Nutrisystem, etc). We discussed strategies for weight loss including increasing high volume/low calorie foods such as vegetables,portion sizes, reducing calories from beverages, protein supplements and using the plate method as a guide for meal planning. Further discussed, reducing sodium intake and reading food labels for sodium.      Intervention Plan   Intervention Prescribe, educate and counsel regarding individualized specific dietary modifications aiming towards targeted core components such as weight, hypertension, lipid management,  diabetes, heart failure and other comorbidities.;Nutrition handout(s) given to patient.    Expected Outcomes Short Term Goal: Understand basic principles of dietary content, such as calories, fat, sodium, cholesterol and nutrients.;Long Term Goal: Adherence to prescribed nutrition plan.;Short Term Goal: A plan has been developed with personal nutrition goals set during dietitian appointment.             Nutrition Assessments:  Nutrition Assessments - 03/29/22 1441       Rate Your Plate Scores   Pre Score 51            MEDIFICTS Score Key: ?70 Need to make dietary changes  40-70 Heart Healthy Diet ? 40 Therapeutic Level Cholesterol Diet  Flowsheet Row PULMONARY REHAB CHRONIC OBSTRUCTIVE PULMONARY DISEASE from 03/29/2022 in [place]  Picture Your Plate Total Score on Admission 51      Picture Your Plate Scores: <21 Unhealthy dietary pattern with much room for improvement. 41-50 Dietary pattern unlikely to meet recommendations for good health and room for improvement. 51-60 More healthful dietary pattern, with some room for improvement.  >60 Healthy dietary pattern, although there may be some specific behaviors that could be improved.    Nutrition Goals Re-Evaluation:  Nutrition Goals Re-Evaluation     [place] Name 03/29/22 1155 04/26/22 1122           Goals   Current Weight 227 lb 8.2 oz (103.2 kg) 228 lb 6.3 oz (103.6 kg)      Comment Medical history of  chronic alcoholic hepatitis and fatty liver; patient does have history of low sodium levels though they are WNL at most recent labs. Lipid panel WNL, A1c 6.2. He continues taking MVI and thiamine. He does have follow-up with sleep medicine regarding biPAP. He reports motivation to lose weight; he lives alone and does his own grocery shopping and cooking.  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 No new labs at this time. He is down 3# since starting with our program.       Expected Outcome -- Goals in progress. Patient reports motivation to lose weight. He reports making healthier choices at snacks and meals and interest in calorie controlled meal options (frozen meals, Nutrisystem, etc). We discussed strategies for weight loss including increasing high volume/low calorie foods such  as vegetables,portion sizes, reducing calories from beverages, protein supplements and using the plate method as a guide for meal planning. Further discussed, reducing sodium intake and reading food labels for sodium. Alcohol intake remains a barrier to weight loss.               Nutrition Goals Discharge (Final Nutrition Goals Re-Evaluation):  Nutrition Goals Re-Evaluation - 04/26/22 1122       Goals   Current Weight 228 lb 6.3 oz (103.6 kg)    Comment No new labs at this time. He is down 3# since starting with our program.    Expected Outcome Goals in progress. Patient reports motivation to lose weight. He reports making healthier choices at snacks and meals and interest in calorie controlled meal options (frozen meals, Nutrisystem, etc). We discussed strategies for weight loss including increasing high volume/low calorie foods such as vegetables,portion sizes, reducing calories from beverages, protein supplements and using the plate method as a guide for meal planning. Further discussed, reducing sodium intake and reading food labels for sodium. Alcohol intake remains a barrier to weight loss.             Psychosocial: Target Goals: Acknowledge presence or absence of significant depression and/or stress, maximize coping skills, provide positive support system. Participant is able to verbalize types and ability to use techniques and skills needed for reducing stress and depression.  Initial Review & Psychosocial Screening:  Initial Psych Review & Screening - 03/21/22 0856       Initial Review   Current issues with None Identified      Family Dynamics   Good Support  System? Yes    Comments friend      Barriers   Psychosocial barriers to participate in program There are no identifiable barriers or psychosocial needs.      Screening Interventions   Interventions Encouraged to exercise             Quality of Life Scores:  Scores of 19 and below usually indicate a poorer quality of life in these areas.  A difference of  2-3 points is a clinically meaningful difference.  A difference of 2-3 points in the total score of the Quality of Life Index has been associated with significant improvement in overall quality of life, self-image, physical symptoms, and general health in studies assessing change in quality of life.  PHQ-9: Review Flowsheet  More data may exist      03/21/2022 04/10/2018 12/29/2017 10/29/2015 06/29/2015  Depression screen PHQ 2/9  Decreased Interest 0 0 0 0 1  Down, Depressed, Hopeless 0 0 0 0 0  PHQ - 2 Score 0 0 0 0 1  Altered sleeping 0 0 0 - -  Tired, decreased energy 0 1 1 - -  Change in appetite 0 0 0 - -  Feeling bad or failure about yourself  0 0 0 - -  Trouble concentrating 0 0 0 - -  Moving slowly or fidgety/restless 0 0 0 - -  Suicidal thoughts 0 0 0 - -  PHQ-9 Score 0 1 1 - -  Difficult doing work/chores - Somewhat difficult Somewhat difficult - -   Interpretation of Total Score  Total Score Depression Severity:  1-4 = Minimal depression, 5-9 = Mild depression, 10-14 = Moderate depression, 15-19 = Moderately severe depression, 20-27 = Severe depression   Psychosocial Evaluation and Intervention:  Psychosocial Evaluation - 03/21/22 0857       Psychosocial Evaluation & Interventions  Interventions Encouraged to exercise with the program and follow exercise prescription    Expected Outcomes For to participate in Pulmonary Rehab    Continue Psychosocial Services  Follow up required by staff             Psychosocial Re-Evaluation:  Psychosocial Re-Evaluation     [place] Name 03/23/22 0854 04/13/22 1529  05/10/22 0909         Psychosocial Re-Evaluation   Current issues with None Identified None Identified None Identified     Comments Jaiyden has attended his orientation session 03/21/22  He is scheduled to start the program 03/29/22 will continue to monitor for  his psychosocial barriers. Kendryck has been attend the PR program without any psychosocial barriers. He is enjoying the class and communicating with the other class members. Tallan has been attending Pulmonary Rehab. He has been set up with home oxygen. He seems to be adjusting well on oxygen. Quillan feels that his conditioning level and shortness of breath have improved as he enjoys classes. He does communicate and relate to other classmates.     Expected Outcomes For him to attended without any psychosocial issues concerns. For him to continue to participate in the program without any psychosocial concerns or barriers. For him to continue to participate in the program without any psychosocial concerns or barriers.     Interventions Encouraged to attend Pulmonary Rehabilitation for the exercise Encouraged to attend Pulmonary Rehabilitation for the exercise Encouraged to attend Pulmonary Rehabilitation for the exercise     Continue Psychosocial Services  Follow up required by staff No Follow up required No Follow up required              Psychosocial Discharge (Final Psychosocial Re-Evaluation):  Psychosocial Re-Evaluation - 05/10/22 0909       Psychosocial Re-Evaluation   Current issues with None Identified    Comments Edrik has been attending Pulmonary Rehab. He has been set up with home oxygen. He seems to be adjusting well on oxygen. Muhamad feels that his conditioning level and shortness of breath have improved as he enjoys classes. He does communicate and relate to other classmates.    Expected Outcomes For him to continue to participate in the program without any psychosocial concerns or barriers.    Interventions Encouraged to attend  Pulmonary Rehabilitation for the exercise    Continue Psychosocial Services  No Follow up required             Education: Education Goals: Education classes will be provided on a weekly basis, covering required topics. Participant will state understanding/return demonstration of topics presented.  Learning Barriers/Preferences:   Education Topics: Introduction to Pulmonary Rehab Group instruction provided by PowerPoint, verbal discussion, and written material to support subject matter. Instructor reviews what Pulmonary Rehab is, the purpose of the program, and how patients are referred.     Know Your Numbers Group instruction that is supported by a PowerPoint presentation. Instructor discusses importance of knowing and understanding resting, exercise, and post-exercise oxygen saturation, heart rate, and blood pressure. Oxygen saturation, heart rate, blood pressure, rating of perceived exertion, and dyspnea are reviewed along with a normal range for these values.    Exercise for the Pulmonary Patient Group instruction that is supported by a PowerPoint presentation. Instructor discusses benefits of exercise, core components of exercise, frequency, duration, and intensity of an exercise routine, importance of utilizing pulse oximetry during exercise, safety while exercising, and options of places to exercise outside of rehab.  Flowsheet Row  PULMONARY REHAB CHRONIC OBSTRUCTIVE PULMONARY DISEASE from 04/05/2018 in [place]  Date 03/15/18  Instruction Review Code 1- Verbalizes Understanding          MET Level  Group instruction provided by PowerPoint, verbal discussion, and written material to support subject matter. Instructor reviews what METs are and how to increase METs.    Pulmonary Medications Verbally interactive group education provided by instructor with focus on inhaled medications and proper administration. Flowsheet Row PULMONARY REHAB  CHRONIC OBSTRUCTIVE PULMONARY DISEASE from 05/05/2022 in [place]  Date 04/07/22  Educator Donnetta Simpers  Instruction Review Code 1- Verbalizes Understanding       Anatomy and Physiology of the Respiratory System Group instruction provided by PowerPoint, verbal discussion, and written material to support subject matter. Instructor reviews respiratory cycle and anatomical components of the respiratory system and their functions. Instructor also reviews differences in obstructive and restrictive respiratory diseases with examples of each.  Flowsheet Row PULMONARY REHAB CHRONIC OBSTRUCTIVE PULMONARY DISEASE from 05/05/2022 in [place]  Date 04/14/22  Educator Donnetta Simpers  [Handout]  Instruction Review Code 1- Verbalizes Understanding       Oxygen Safety Group instruction provided by PowerPoint, verbal discussion, and written material to support subject matter. There is an overview of "What is Oxygen" and "Why do we need it".  Instructor also reviews how to create a safe environment for oxygen use, the importance of using oxygen as prescribed, and the risks of noncompliance. There is a brief discussion on traveling with oxygen and resources the patient may utilize. Flowsheet Row PULMONARY REHAB CHRONIC OBSTRUCTIVE PULMONARY DISEASE from 05/05/2022 in [place]  Date 04/21/22  Educator Donnetta Simpers  Instruction Review Code 1- Verbalizes Understanding       Oxygen Use Group instruction provided by PowerPoint, verbal discussion, and written material to discuss how supplemental oxygen is prescribed and different types of oxygen supply systems. Resources for more information are provided.  Flowsheet Row PULMONARY REHAB CHRONIC OBSTRUCTIVE PULMONARY DISEASE from 05/05/2022 in [place]  Date 04/28/22  Educator Donnetta Simpers  Instruction Review Code 1- Verbalizes Understanding        Breathing Techniques Group instruction that is supported by demonstration and [employer]. Instructor discusses the benefits of pursed lip and diaphragmatic breathing and detailed demonstration on how to perform both.  Flowsheet Row PULMONARY REHAB CHRONIC OBSTRUCTIVE PULMONARY DISEASE from 05/05/2022 in [place]  Date 05/05/22  Educator Donnetta Simpers  Instruction Review Code 1- Verbalizes Understanding        Risk Factor Reduction Group instruction that is supported by a PowerPoint presentation. Instructor discusses the definition of a risk factor, different risk factors for pulmonary disease, and how the heart and lungs work together.   MD Day A group question and answer session with a medical doctor that allows participants to ask questions that relate to their pulmonary disease state. Flowsheet Row PULMONARY REHAB CHRONIC OBSTRUCTIVE PULMONARY DISEASE from 04/05/2018 in [place]  Date 03/20/18  Educator Dr. Nelda Marseille  Instruction Review Code 1- Verbalizes Understanding       Nutrition for the Pulmonary Patient Group instruction provided by PowerPoint slides, verbal discussion, and written materials to support subject matter. The instructor gives an explanation and review of healthy diet recommendations, which includes a discussion on weight management, recommendations for fruit and vegetable consumption, as well as protein, fluid, caffeine, fiber, sodium,  sugar, and alcohol. Tips for eating when patients are short of breath are discussed. Flowsheet Row PULMONARY REHAB CHRONIC OBSTRUCTIVE PULMONARY DISEASE from 05/05/2022 in [place]  Date 03/31/22  Educator Sam  Instruction Review Code 2- Demonstrated Understanding        Other Education Group or individual verbal, written, or video instructions that support the educational goals of the pulmonary rehab program. Flowsheet  Row PULMONARY REHAB CHRONIC OBSTRUCTIVE PULMONARY DISEASE from 02/22/2018 in [place]  Date 02/08/18  Educator Lucianne Lei  [facility]]  Instruction Review Code 1- Verbalizes Understanding        Knowledge Questionnaire Score:  Knowledge Questionnaire Score - 05/10/22 1546       Knowledge Questionnaire Score   Post Score 18/18             Core Components/Risk Factors/Patient Goals at Admission:  Personal Goals and Risk Factors at Admission - 03/21/22 0859       Core Components/Risk Factors/Patient Goals on Admission    Weight Management Weight Loss    Improve shortness of breath with ADL's Yes    Intervention Provide education, individualized exercise plan and daily activity instruction to help decrease symptoms of SOB with activities of daily living.    Expected Outcomes Short Term: Improve cardiorespiratory fitness to achieve a reduction of symptoms when performing ADLs;Long Term: Be able to perform more ADLs without symptoms or delay the onset of symptoms             Core Components/Risk Factors/Patient Goals Review:   Goals and Risk Factor Review     Row Name 03/23/22 0857 04/13/22 1534 05/10/22 0911         Core Components/Risk Factors/Patient Goals Review   Personal Goals Review Other;Improve shortness of breath with ADL's;Develop more efficient breathing techniques such as purse lipped breathing and diaphragmatic breathing and practicing self-pacing with activity.;Increase knowledge of respiratory medications and ability to use respiratory devices properly. Weight Management/Obesity;Improve shortness of breath with ADL's;Develop more efficient breathing techniques such as purse lipped breathing and diaphragmatic breathing and practicing self-pacing with activity. Weight Management/Obesity;Improve shortness of breath with ADL's;Develop more efficient breathing techniques such as purse lipped breathing and  diaphragmatic breathing and practicing self-pacing with activity.     Review Weight loss has been an issues for him will continue to monitor his core components as he attends the PR program Donne is exercising walking the track and on the nustep. He has been slowly increasing his workloads and METS. We did have to place him on oxygen 1 iter for exercise due to desaturation. He has been accepting to being on oxygen at home if it is necessary. We have been trying to reach out to the [place] to let them know that he has required oxygen for exercising.We have yet to hear back from a message that was left for them. Roald is exercising walking the track and on the nustep. Dartanian's METs have remained relatively the same. He still motivated to improve. His oxygen saturation has been stable since being places on oxygen. He oxygen saturation has been 94% on 4L. His liter flow increased when he did a walk test with the [place]. Brenden's weight has remained relatively the same.     Expected Outcomes See admission goal See admission goals. See admission goals.              Core Components/Risk Factors/Patient Goals at Discharge (Final Review):  Goals and Risk Factor Review - 05/10/22 0911       Core Components/Risk Factors/Patient Goals Review   Personal Goals Review Weight Management/Obesity;Improve shortness of breath with ADL's;Develop more efficient breathing techniques such as purse lipped breathing and diaphragmatic breathing and practicing self-pacing with activity.    Review Vershawn is exercising walking the track and on the nustep. Adonijah's METs have remained relatively the same. He still motivated to improve. His oxygen saturation has been stable since being places on oxygen. He oxygen saturation has been 94% on 4L. His liter flow increased when he did a walk test with the [place]. Lucan's weight has remained relatively the same.    Expected Outcomes See admission goals.             ITP Comments:   Comments: Pt is  making expected progress toward Pulmonary Rehab goals after completing 13 sessions. Recommend continued exercise, life style modification, education, and utilization of breathing techniques to increase stamina and strength, while also decreasing shortness of breath with exertion.  Dr. Rodman Pickle is Medical Director for Pulmonary Rehab at [facility].

## 2022-05-12 ENCOUNTER — Encounter (HOSPITAL_COMMUNITY)
Admission: RE | Admit: 2022-05-12 | Discharge: 2022-05-12 | Disposition: A | Discharge status: Home / Self Care | Payer: No Typology Code available for payment source | Source: Ambulatory Visit | Attending: Pulmonary Disease | Admitting: Pulmonary Disease

## 2022-05-12 ENCOUNTER — Encounter (HOSPITAL_COMMUNITY): Payer: No Typology Code available for payment source

## 2022-05-12 DIAGNOSIS — J449 Chronic obstructive pulmonary disease, unspecified: Secondary | ICD-10-CM

## 2022-05-12 NOTE — Progress Notes (Signed)
Daily Session Note  Patient Details  Name: Bernardo Brayman MRN: [id number] Date of Birth: 03/13/1951 Referring Provider:   [name] Pulmonary Rehab Walk Test from 03/21/2022 in [place]  Referring Provider [name]  [[name]]       Encounter Date: 05/12/2022  Check In:  Session Check In - 05/12/22 1127       Check-In   Supervising physician immediately available to respond to emergencies [facility] - Physician supervision    Physician(s) Carlis Abbott    Location [facility]    Staff Present Elmon Else, MS, ACSM-CEP, Exercise Physiologist;Samantha [place], RD, LDN;Saamiya Jeppsen Ysidro Evert, RN;Carlette Carlton, RN, BSN;Randi Reeve BS, ACSM-CEP, Exercise Physiologist    Virtual Visit No    Medication changes reported     No    Fall or balance concerns reported    No    Tobacco Cessation No Change    Warm-up and Cool-down Performed as group-led Higher education careers adviser Performed Yes    VAD Patient? No    PAD/SET Patient? No      Pain Assessment   Currently in Pain? No/denies    Multiple Pain Sites No             Capillary Blood Glucose: No results found for this or any previous visit (from the past 24 hour(s)).    Social History   Tobacco Use  Smoking Status Former   Packs/day: 3.00   Years: 45.00   Total pack years: 135.00   Types: Cigarettes   Quit date: 06/25/2013   Years since quitting: 8.8   Passive exposure: Never  Smokeless Tobacco Former   Quit date: 06/25/1976    Goals Met:  Proper associated with RPD/PD & O2 Sat Exercise tolerated well No report of concerns or symptoms today Strength training completed today  Goals Unmet:  Not Applicable  Comments: Service time is from [place] to [place]    Dr. Rodman Pickle is Medical Director for Pulmonary Rehab at [facility].

## 2022-05-17 ENCOUNTER — Encounter (HOSPITAL_COMMUNITY)
Admission: RE | Admit: 2022-05-17 | Discharge: 2022-05-17 | Disposition: A | Discharge status: Home / Self Care | Payer: No Typology Code available for payment source | Source: Ambulatory Visit | Attending: Pulmonary Disease | Admitting: Pulmonary Disease

## 2022-05-17 VITALS — Wt 223.8 lb

## 2022-05-17 DIAGNOSIS — J449 Chronic obstructive pulmonary disease, unspecified: Secondary | ICD-10-CM | POA: Insufficient documentation

## 2022-05-17 NOTE — Progress Notes (Signed)
Daily Session Note  Patient Details  Name: Sean [name] MRN: [id number] Date of Birth: November 28, 1950 Referring Provider:   [name] Pulmonary Rehab Walk Test from 03/21/2022 in [place]  Referring Provider [name]  [[name]]       Encounter Date: 05/17/2022  Check In:  Session Check In - 05/17/22 1125       Check-In   Supervising physician immediately available to respond to emergencies [facility] - Physician supervision    Physician(s) Dr. Shearon Stalls    Location [facility]    Staff Present Elmon Else, MS, ACSM-CEP, Exercise Physiologist;Kameren Baade Ysidro Evert, RN;Randi Yevonne Pax, ACSM-CEP, Exercise Physiologist;Carlette Wilber Oliphant, RN, BSN    Virtual Visit No    Medication changes reported     No    Fall or balance concerns reported    No    Tobacco Cessation No Change    Warm-up and Cool-down Performed as group-led instruction    Resistance Training Performed Yes    VAD Patient? No      Pain Assessment   Currently in Pain? No/denies    Multiple Pain Sites No             Capillary Blood Glucose: No results found for this or any previous visit (from the past 24 hour(s)).   Exercise Prescription Changes - 05/17/22 1200       Response to Exercise   Blood Pressure (Admit) 130/68    Blood Pressure (Exercise) 146/64    Blood Pressure (Exit) 132/68    Heart Rate (Admit) 71 bpm    Heart Rate (Exercise) 90 bpm    Heart Rate (Exit) 81 bpm    Oxygen Saturation (Admit) 94 %    Oxygen Saturation (Exercise) 91 %    Oxygen Saturation (Exit) 97 %    Rating of Perceived Exertion (Exercise) 19    Perceived Dyspnea (Exercise) 3    Duration Continue with 30 min of aerobic exercise without signs/symptoms of physical distress.    Intensity THRR unchanged      Progression   Progression Continue to progress workloads to maintain intensity without signs/symptoms of physical distress.      Resistance Training   Training Prescription Yes    Weight Blue  bands    Reps 10-15    Time 10 Minutes      Oxygen   Oxygen Continuous    Liters 4      NuStep   Level 5    SPM 80    Minutes 15    METs 2.4      Track   Laps 6    Minutes 15             Social History   Tobacco Use  Smoking Status Former   Packs/day: 3.00   Years: 45.00   Total pack years: 135.00   Types: Cigarettes   Quit date: 06/25/2013   Years since quitting: 8.8   Passive exposure: Never  Smokeless Tobacco Former   Quit date: 06/25/1976    Goals Met:  Proper associated with RPD/PD & O2 Sat Exercise tolerated well No report of concerns or symptoms today Strength training completed today  Goals Unmet:  Not Applicable  Comments: Service time is from 1009 to [place]    Dr. Rodman Pickle is Medical Director for Pulmonary Rehab at [facility].

## 2022-05-19 ENCOUNTER — Encounter (HOSPITAL_COMMUNITY)
Admission: RE | Admit: 2022-05-19 | Discharge: 2022-05-19 | Disposition: A | Discharge status: Home / Self Care | Payer: No Typology Code available for payment source | Source: Ambulatory Visit | Attending: Pulmonary Disease | Admitting: Pulmonary Disease

## 2022-05-19 DIAGNOSIS — J449 Chronic obstructive pulmonary disease, unspecified: Secondary | ICD-10-CM

## 2022-05-19 NOTE — Progress Notes (Signed)
Daily Session Note  Patient Details  Name: Sean [name] MRN: [id number] Date of Birth: 09/06/1950 Referring Provider:   [name] Pulmonary Rehab Walk Test from 03/21/2022 in [place]  Referring Provider [name]  [[name]]       Encounter Date: 05/19/2022  Check In:  Session Check In - 05/19/22 1123       Check-In   Supervising physician immediately available to respond to emergencies [facility] - Physician supervision    Physician(s) Shearon Stalls    [facility]    Staff Present Elmon Else, MS, ACSM-CEP, Exercise Physiologist;Samantha [place], RD, LDN;Lisa Ysidro Evert, RN;Avalina Benko [facility], ACSM-CEP, Exercise Physiologist;Jetta Gilford Rile BS, ACSM-CEP, Exercise Physiologist    Virtual Visit No    Medication changes reported     No    Fall or balance concerns reported    No    Warm-up and Cool-down Performed as group-led Higher education careers adviser Performed Yes    VAD Patient? No    PAD/SET Patient? No      PAD/SET Patient   Completed foot check today? No    Open wounds to report? No      Pain Assessment   Currently in Pain? No/denies             Capillary Blood Glucose: No results found for this or any previous visit (from the past 24 hour(s)).    Social History   Tobacco Use  Smoking Status Former   Packs/day: 3.00   Years: 45.00   Total pack years: 135.00   Types: Cigarettes   Quit date: 06/25/2013   Years since quitting: 8.9   Passive exposure: Never  Smokeless Tobacco Former   Quit date: 06/25/1976    Goals Met:  Independence with exercise equipment Using PLB without cueing & demonstrates good technique Exercise tolerated well No report of concerns or symptoms today Strength training completed today  Goals Unmet:  Not Applicable  Comments: Service time is from 1016 to 1130.    Dr. Rodman Pickle is Medical Director for Pulmonary Rehab at [facility]

## 2022-05-24 ENCOUNTER — Encounter (HOSPITAL_COMMUNITY)
Admission: RE | Admit: 2022-05-24 | Discharge: 2022-05-24 | Disposition: A | Discharge status: Home / Self Care | Payer: No Typology Code available for payment source | Source: Ambulatory Visit | Attending: Pulmonary Disease | Admitting: Pulmonary Disease

## 2022-05-24 DIAGNOSIS — J449 Chronic obstructive pulmonary disease, unspecified: Secondary | ICD-10-CM | POA: Diagnosis not present

## 2022-05-24 NOTE — Progress Notes (Signed)
Daily Session Note  Patient Details  Name: Sean [name] MRN: [id number] Date of Birth: 07-24-1951 Referring Provider:   [name] Pulmonary Rehab Walk Test from 03/21/2022 in [place]  Referring Provider [name]  [[name]]       Encounter Date: 05/24/2022  Check In:  Session Check In - 05/24/22 1128       Check-In   Supervising physician immediately available to respond to emergencies [facility] - Physician supervision    Physician(s) Erskine Emery    [facility]    Staff Present Elmon Else, MS, ACSM-CEP, Exercise Physiologist;Lisa Ysidro Evert, RN;Johm Pfannenstiel Olen Cordial BS, ACSM-CEP, Exercise Physiologist;Samantha [place], RD, LDN    Virtual Visit No    Medication changes reported     No    Fall or balance concerns reported    No    Tobacco Cessation No Change    Warm-up and Cool-down Performed as group-led instruction    Resistance Training Performed No    VAD Patient? No    PAD/SET Patient? No      PAD/SET Patient   Completed foot check today? No    Open wounds to report? No      Pain Assessment   Currently in Pain? No/denies             Capillary Blood Glucose: No results found for this or any previous visit (from the past 24 hour(s)).    Social History   Tobacco Use  Smoking Status Former   Packs/day: 3.00   Years: 45.00   Total pack years: 135.00   Types: Cigarettes   Quit date: 06/25/2013   Years since quitting: 8.9   Passive exposure: Never  Smokeless Tobacco Former   Quit date: 06/25/1976    Goals Met:  Independence with exercise equipment Exercise tolerated well No report of concerns or symptoms today  Goals Unmet:  Not Applicable  Comments: Pt did 6 min walk test today then left by accident as he thought he was supposed to leave. Called pt and explained. Pt will finish his sessions on 05/26/22. Service time is from 0957 to 1035.    Dr. Rodman Pickle is Medical Director for Pulmonary Rehab at [facility].

## 2022-05-26 ENCOUNTER — Encounter (HOSPITAL_COMMUNITY)
Admission: RE | Admit: 2022-05-26 | Discharge: 2022-05-26 | Disposition: A | Discharge status: Home / Self Care | Payer: No Typology Code available for payment source | Source: Ambulatory Visit | Attending: Pulmonary Disease | Admitting: Pulmonary Disease

## 2022-05-26 DIAGNOSIS — J449 Chronic obstructive pulmonary disease, unspecified: Secondary | ICD-10-CM

## 2022-05-26 NOTE — Progress Notes (Signed)
Daily Session Note  Patient Details  Name: Sean [name] MRN: [id number] Date of Birth: 1951-02-01 Referring Provider:   [name] Pulmonary Rehab Walk Test from 03/21/2022 in [place]  Referring Provider [name]  [[name]]       Encounter Date: 05/26/2022  Check In:  Session Check In - 05/26/22 1126       Check-In   Supervising physician immediately available to respond to emergencies [facility] - Physician supervision    Physician(s) Erskine Emery    [facility]    Staff Present Elmon Else, MS, ACSM-CEP, Exercise Physiologist;Lisa Ysidro Evert, RN;Randi Olen Cordial BS, ACSM-CEP, Exercise Physiologist;Samantha [place], RD, LDN    Virtual Visit No    Medication changes reported     No    Fall or balance concerns reported    No    Tobacco Cessation No Change    Warm-up and Cool-down Performed as group-led instruction    Resistance Training Performed Yes    VAD Patient? No    PAD/SET Patient? No      PAD/SET Patient   Completed foot check today? No    Open wounds to report? No             Capillary Blood Glucose: No results found for this or any previous visit (from the past 24 hour(s)).    Social History   Tobacco Use  Smoking Status Former   Packs/day: 3.00   Years: 45.00   Total pack years: 135.00   Types: Cigarettes   Quit date: 06/25/2013   Years since quitting: 8.9   Passive exposure: Never  Smokeless Tobacco Former   Quit date: 06/25/1976    Goals Met:  Proper associated with RPD/PD & O2 Sat Independence with exercise equipment Exercise tolerated well No report of concerns or symptoms today Strength training completed today  Goals Unmet:  Not Applicable  Comments: Service time is from 1024 to 1145.    Dr. Rodman Pickle is Medical Director for Pulmonary Rehab at [facility].

## 2022-05-31 NOTE — Progress Notes (Signed)
Discharge Progress Report  Patient Details  Name: Sean [name] MRN: [id number] Date of Birth: February 05, 1951 Referring Provider:   [name] Walk Test from 03/21/2022 in [facility]  Referring Provider [name]  [[name]]        Number of Visits: 81  Reason for Discharge:  Patient has met program and personal goals.  Smoking History:  Social History   Tobacco Use  Smoking Status Former   Packs/day: 3.00   Years: 45.00   Total pack years: 135.00   Types: Cigarettes   Quit date: 06/25/2013   Years since quitting: 8.9   Passive exposure: Never  Smokeless Tobacco Former   Quit date: 06/25/1976    Diagnosis:  Chronic obstructive pulmonary disease, unspecified COPD type ([place])  ADL UCSD:  Pulmonary Assessment Scores     Row Name 03/21/22 0909 05/10/22 1547       ADL UCSD   ADL Phase Entry Exit    SOB Score total 58 63      CAT Score   CAT Score 13 16      mMRC Score   mMRC Score 4 --             Initial Exercise Prescription:  Initial Exercise Prescription - 03/21/22 1000       Date of Initial Exercise RX and Referring Provider   Date 03/21/22    Referring Provider Pat Kocher   Expected Discharge Date 05/26/22      NuStep   Level 1    SPM 80    Minutes 15      Track   Minutes 15    METs 2.37      Prescription Details   Frequency (times per week) 2    Duration Progress to 30 minutes of continuous aerobic without signs/symptoms of physical distress      Intensity   THRR 40-80% of Max Heartrate 60-121    Ratings of Perceived Exertion 11-13    Perceived Dyspnea 0-4      Progression   Progression Continue progressive overload as per policy without signs/symptoms or physical distress.      Resistance Training   Training Prescription Yes    Weight blue bands    Reps 10-15             Discharge Exercise Prescription (Final Exercise Prescription Changes):  Exercise Prescription Changes -  05/17/22 1200       Response to Exercise   Blood Pressure (Admit) 130/68    Blood Pressure (Exercise) 146/64    Blood Pressure (Exit) 132/68    Heart Rate (Admit) 71 bpm    Heart Rate (Exercise) 90 bpm    Heart Rate (Exit) 81 bpm    Oxygen Saturation (Admit) 94 %    Oxygen Saturation (Exercise) 91 %    Oxygen Saturation (Exit) 97 %    Rating of Perceived Exertion (Exercise) 19    Perceived Dyspnea (Exercise) 3    Duration Continue with 30 min of aerobic exercise without signs/symptoms of physical distress.    Intensity THRR unchanged      Progression   Progression Continue to progress workloads to maintain intensity without signs/symptoms of physical distress.      Resistance Training   Training Prescription Yes    Weight Blue bands    Reps 10-15    Time 10 Minutes      Oxygen   Oxygen Continuous    Liters 4  NuStep   Level 5    SPM 80    Minutes 15    METs 2.4      Track   Laps 6    Minutes 15             Functional Capacity:  6 Minute Walk     Row Name 03/21/22 1006 05/24/22 1530       6 Minute Walk   Phase Initial Discharge    Distance 1052 feet 825 feet    Distance % Change -- -21.58 %    Distance Feet Change -- -227 ft    Walk Time 6 minutes 6 minutes    # of Rest Breaks 2  1:32-1:44, 2:50-4:00 2  2:16-2:30, 3:50-4:44    MPH 1.99 1.56    METS 2.37 1.93    RPE 19 19    Perceived Dyspnea  3 3    VO2 Peak 8.3 6.75    Symptoms No No    Resting HR 82 bpm 91 bpm    Resting BP 134/72 144/68    Resting Oxygen Saturation  90 % 93 %    Exercise Oxygen Saturation  during 6 min walk 88 % 89 %    Max Ex. HR 102 bpm 100 bpm    Max Ex. BP 184/80 168/70    2 Minute Post BP 174/80  4 min post: 140/78 142/68      Interval HR   1 Minute HR 94 95    2 Minute HR 97 99    3 Minute HR 102 97    4 Minute HR 98 99    5 Minute HR 101 100    6 Minute HR 94 99    2 Minute Post HR 87 90    Interval Heart Rate? Yes Yes      Interval Oxygen   Interval  Oxygen? Yes Yes    Baseline Oxygen Saturation % 90 % 93 %    1 Minute Oxygen Saturation % 89 % 92 %    1 Minute Liters of Oxygen 0 L 4 L    2 Minute Oxygen Saturation % 88 % 91 %    2 Minute Liters of Oxygen 0 L 4 L    3 Minute Oxygen Saturation % 90 % 92 %    3 Minute Liters of Oxygen 0 L 4 L    4 Minute Oxygen Saturation % 91 % 89 %    4 Minute Liters of Oxygen 0 L 4 L    5 Minute Oxygen Saturation % 89 % 92 %    5 Minute Liters of Oxygen 0 L 4 L    6 Minute Oxygen Saturation % 90 % 91 %    6 Minute Liters of Oxygen 0 L 4 L    2 Minute Post Oxygen Saturation % 92 % 96 %    2 Minute Post Liters of Oxygen 0 L 4 L             Psychological, QOL, Others - Outcomes: PHQ 2/9:    05/24/2022    3:38 PM 03/21/2022    8:56 AM 04/10/2018    2:19 PM 12/29/2017   10:31 AM 10/29/2015    1:38 PM  Depression screen PHQ 2/9  Decreased Interest 0 0 0 0 0  Down, Depressed, Hopeless 0 0 0 0 0  PHQ - 2 Score 0 0 0 0 0  Altered sleeping 0 0 0 0   Tired, decreased  [name] 0 0 1 1   Change in appetite 0 0 0 0   Feeling bad or failure about yourself  0 0 0 0   Trouble concentrating 0 0 0 0   Moving slowly or fidgety/restless 0 0 0 0   Suicidal thoughts 0 0 0 0   PHQ-9 Score 0 0 1 1   Difficult doing work/chores   Somewhat difficult Somewhat difficult     Quality of Life:   Personal Goals: Goals established at orientation with interventions provided to work toward goal.  Personal Goals and Risk Factors at Admission - 03/21/22 0859       Core Components/Risk Factors/Patient Goals on Admission    Weight Management Weight Loss    Improve shortness of breath with ADL's Yes    Intervention Provide education, individualized exercise plan and Sean activity instruction to help decrease symptoms of SOB with activities of Sean living.    Expected Outcomes Short Term: Improve cardiorespiratory fitness to achieve a reduction of symptoms when performing ADLs;Long Term: Be able to perform more ADLs  without symptoms or delay the onset of symptoms              Personal Goals Discharge:  Goals and Risk Factor Review     Row Name 03/23/22 0857 04/13/22 1534 05/10/22 0911         Core Components/Risk Factors/Patient Goals Review   Personal Goals Review Other;Improve shortness of breath with ADL's;Develop more efficient breathing techniques such as purse lipped breathing and diaphragmatic breathing and practicing self-pacing with activity.;Increase knowledge of respiratory medications and ability to use respiratory devices properly. Weight Management/Obesity;Improve shortness of breath with ADL's;Develop more efficient breathing techniques such as purse lipped breathing and diaphragmatic breathing and practicing self-pacing with activity. Weight Management/Obesity;Improve shortness of breath with ADL's;Develop more efficient breathing techniques such as purse lipped breathing and diaphragmatic breathing and practicing self-pacing with activity.     Review Weight loss has been an issues for him will continue to monitor his core components as he attends the [facility] program Sean [name] is exercising walking the track and on the nustep. He has been slowly increasing his workloads and METS. We did have to place him on oxygen 1 iter for exercise due to desaturation. He has been accepting to being on oxygen at home if it is necessary. We have been trying to reach out to the [place] to let them know that he has required oxygen for exercising.We have yet to hear back from a message that was left for them. Sean [name] is exercising walking the track and on the nustep. Sean [name]'s METs have remained relatively the same. He still motivated to improve. His oxygen saturation has been stable since being places on oxygen. He oxygen saturation has been 94% on 4L. His liter flow increased when he did a walk test with the [place]. Sean [name]'s weight has remained relatively the same.     Expected Outcomes See admission goal See admission goals. See  admission goals.              Exercise Goals and Review:  Exercise Goals     Row Name 03/21/22 0901 03/22/22 0908 04/14/22 0805 05/09/22 0739       Exercise Goals   Increase Physical Activity Yes Yes Yes Yes    Intervention Provide advice, education, support and counseling about physical activity/exercise needs.;Develop an individualized exercise prescription for aerobic and resistive training based on initial evaluation findings, risk stratification, comorbidities and participant's personal goals. Provide advice,  education, support and counseling about physical activity/exercise needs.;Develop an individualized exercise prescription for aerobic and resistive training based on initial evaluation findings, risk stratification, comorbidities and participant's personal goals. Provide advice, education, support and counseling about physical activity/exercise needs.;Develop an individualized exercise prescription for aerobic and resistive training based on initial evaluation findings, risk stratification, comorbidities and participant's personal goals. Provide advice, education, support and counseling about physical activity/exercise needs.;Develop an individualized exercise prescription for aerobic and resistive training based on initial evaluation findings, risk stratification, comorbidities and participant's personal goals.    Expected Outcomes Short Term: Attend rehab on a regular basis to increase amount of physical activity.;Long Term: Add in home exercise to make exercise part of routine and to increase amount of physical activity.;Long Term: Exercising regularly at least 3-5 days a week. Short Term: Attend rehab on a regular basis to increase amount of physical activity.;Long Term: Add in home exercise to make exercise part of routine and to increase amount of physical activity.;Long Term: Exercising regularly at least 3-5 days a week. Short Term: Attend rehab on a regular basis to increase  amount of physical activity.;Long Term: Add in home exercise to make exercise part of routine and to increase amount of physical activity.;Long Term: Exercising regularly at least 3-5 days a week. Short Term: Attend rehab on a regular basis to increase amount of physical activity.;Long Term: Add in home exercise to make exercise part of routine and to increase amount of physical activity.;Long Term: Exercising regularly at least 3-5 days a week.    Increase Strength and Stamina Yes Yes Yes Yes    Intervention Develop an individualized exercise prescription for aerobic and resistive training based on initial evaluation findings, risk stratification, comorbidities and participant's personal goals.;Provide advice, education, support and counseling about physical activity/exercise needs. Develop an individualized exercise prescription for aerobic and resistive training based on initial evaluation findings, risk stratification, comorbidities and participant's personal goals.;Provide advice, education, support and counseling about physical activity/exercise needs. Develop an individualized exercise prescription for aerobic and resistive training based on initial evaluation findings, risk stratification, comorbidities and participant's personal goals.;Provide advice, education, support and counseling about physical activity/exercise needs. Develop an individualized exercise prescription for aerobic and resistive training based on initial evaluation findings, risk stratification, comorbidities and participant's personal goals.;Provide advice, education, support and counseling about physical activity/exercise needs.    Expected Outcomes Short Term: Increase workloads from initial exercise prescription for resistance, speed, and METs.;Short Term: Perform resistance training exercises routinely during rehab and add in resistance training at home;Long Term: Improve cardiorespiratory fitness, muscular endurance and strength  as measured by increased METs and functional capacity (6MWT) Short Term: Increase workloads from initial exercise prescription for resistance, speed, and METs.;Short Term: Perform resistance training exercises routinely during rehab and add in resistance training at home;Long Term: Improve cardiorespiratory fitness, muscular endurance and strength as measured by increased METs and functional capacity (6MWT) Short Term: Increase workloads from initial exercise prescription for resistance, speed, and METs.;Short Term: Perform resistance training exercises routinely during rehab and add in resistance training at home;Long Term: Improve cardiorespiratory fitness, muscular endurance and strength as measured by increased METs and functional capacity (6MWT) Short Term: Increase workloads from initial exercise prescription for resistance, speed, and METs.;Short Term: Perform resistance training exercises routinely during rehab and add in resistance training at home;Long Term: Improve cardiorespiratory fitness, muscular endurance and strength as measured by increased METs and functional capacity (6MWT)    Able to understand and use rate of perceived exertion (RPE) scale  Yes Yes Yes Yes    Intervention Provide education and explanation on how to use RPE scale Provide education and explanation on how to use RPE scale Provide education and explanation on how to use RPE scale Provide education and explanation on how to use RPE scale    Expected Outcomes Short Term: Able to use RPE Sean in rehab to express subjective intensity level;Long Term:  Able to use RPE to guide intensity level when exercising independently Short Term: Able to use RPE Sean in rehab to express subjective intensity level;Long Term:  Able to use RPE to guide intensity level when exercising independently Short Term: Able to use RPE Sean in rehab to express subjective intensity level;Long Term:  Able to use RPE to guide intensity level when exercising  independently Short Term: Able to use RPE Sean in rehab to express subjective intensity level;Long Term:  Able to use RPE to guide intensity level when exercising independently    Able to understand and use Dyspnea scale Yes Yes Yes Yes    Intervention Provide education and explanation on how to use Dyspnea scale Provide education and explanation on how to use Dyspnea scale Provide education and explanation on how to use Dyspnea scale Provide education and explanation on how to use Dyspnea scale    Expected Outcomes Short Term: Able to use Dyspnea scale Sean in rehab to express subjective sense of shortness of breath during exertion;Long Term: Able to use Dyspnea scale to guide intensity level when exercising independently Short Term: Able to use Dyspnea scale Sean in rehab to express subjective sense of shortness of breath during exertion;Long Term: Able to use Dyspnea scale to guide intensity level when exercising independently Short Term: Able to use Dyspnea scale Sean in rehab to express subjective sense of shortness of breath during exertion;Long Term: Able to use Dyspnea scale to guide intensity level when exercising independently Short Term: Able to use Dyspnea scale Sean in rehab to express subjective sense of shortness of breath during exertion;Long Term: Able to use Dyspnea scale to guide intensity level when exercising independently    Knowledge and understanding of Target Heart Rate Range (THRR) Yes Yes Yes Yes    Intervention Provide education and explanation of THRR including how the numbers were predicted and where they are located for reference Provide education and explanation of THRR including how the numbers were predicted and where they are located for reference Provide education and explanation of THRR including how the numbers were predicted and where they are located for reference Provide education and explanation of THRR including how the numbers were predicted and where they are  located for reference    Expected Outcomes Short Term: Able to state/look up THRR;Long Term: Able to use THRR to govern intensity when exercising independently;Short Term: Able to use Sean as guideline for intensity in rehab Short Term: Able to state/look up THRR;Long Term: Able to use THRR to govern intensity when exercising independently;Short Term: Able to use Sean as guideline for intensity in rehab Short Term: Able to state/look up THRR;Long Term: Able to use THRR to govern intensity when exercising independently;Short Term: Able to use Sean as guideline for intensity in rehab Short Term: Able to state/look up THRR;Long Term: Able to use THRR to govern intensity when exercising independently;Short Term: Able to use Sean as guideline for intensity in rehab    Understanding of Exercise Prescription Yes Yes Yes Yes    Intervention Provide education, explanation, and written materials on patient's individual exercise  prescription Provide education, explanation, and written materials on patient's individual exercise prescription Provide education, explanation, and written materials on patient's individual exercise prescription Provide education, explanation, and written materials on patient's individual exercise prescription    Expected Outcomes Short Term: Able to explain program exercise prescription;Long Term: Able to explain home exercise prescription to exercise independently Short Term: Able to explain program exercise prescription;Long Term: Able to explain home exercise prescription to exercise independently Short Term: Able to explain program exercise prescription;Long Term: Able to explain home exercise prescription to exercise independently Short Term: Able to explain program exercise prescription;Long Term: Able to explain home exercise prescription to exercise independently             Exercise Goals Re-Evaluation:  Exercise Goals Re-Evaluation     Row Name 03/22/22 0909 04/14/22 0805  05/09/22 0739         Exercise Goal Re-Evaluation   Exercise Goals Review Increase Physical Activity;Increase Strength and Stamina;Able to understand and use rate of perceived exertion (RPE) scale;Able to understand and use Dyspnea scale;Knowledge and understanding of Target Heart Rate Range (THRR);Understanding of Exercise Prescription Increase Physical Activity;Increase Strength and Stamina;Able to understand and use rate of perceived exertion (RPE) scale;Able to understand and use Dyspnea scale;Knowledge and understanding of Target Heart Rate Range (THRR);Understanding of Exercise Prescription Increase Physical Activity;Increase Strength and Stamina;Able to understand and use rate of perceived exertion (RPE) scale;Able to understand and use Dyspnea scale;Knowledge and understanding of Target Heart Rate Range (THRR);Understanding of Exercise Prescription     Comments Sean [name] is scheduled to begin exercise next week. Will continue to monitor and progress as able. Sean [name] has completed 5 exercise sessions. He exercises for 15 min on the Nustep and track. He averages 2.0 METs at level 2 on the Nustep and 1.7 METs on the track. He has increases his workload on the Nustep once. He tolerated this well. Will consider progressing him again. Sean [name] is deconditioned. His METs on the Nustep and the track have remained relatively the same. Will discuss pacing strategies for the track. He performs the warmup and cooldown standing mostly. He does alternating leg raises during the squats. Will continue to monitor and progress as able. Sean [name] has completed 12 exercise sessions. He exercises for 15 min on the Nustep and track. Sean [name] averages 2.3 METs at level 4 on the Nustep and 1.7 METs on the track. Sean [name] has increased his workload on the Nustep as he tolerated progression well. He laps on the track vary from 6-8 dependent on his shortness of breath. Sean [name] is very motivated to exercise and improve his functional capacity. We have  discussed home exercise. Sean [name] was not currently exercising when I first mentioned home exercise. He is making an effort now to exercise at home. Will continue to montitor and progress as able.     Expected Outcomes Through exercise at rehab and home, the patient will decrease shortness of breath with Sean activities and feel confident in carrying out an exercise regimen at home. Through exercise at rehab and home, the patient will decrease shortness of breath with Sean activities and feel confident in carrying out an exercise regimen at home. Through exercise at rehab and home, the patient will decrease shortness of breath with Sean activities and feel confident in carrying out an exercise regimen at home.              Nutrition & Weight - Outcomes:    Nutrition:  Nutrition Therapy & Goals - 05/26/22 1153  Nutrition Therapy   Diet Heart Healthy Diet      Personal Nutrition Goals   Nutrition Goal Patient to choose a Sean variety of fruits,vegetables, whole grains, lean protein/plant protein, nonfat dairy as part of heart healthy lifestyle    Personal Goal #2 Patient to understand strategies for weight loss of 0.5-2.0# per week.    Personal Goal #3 Patient to identify and limit Sean intake of saturated fat, trans fat, sodium, and refined carbohydrates.    Comments Goals in action. Sean [name] today.  Sean [name] has started making many dietary changes including reduced/eliminated sugary drinks and reduced overall portion sizes. He is down 7.5# since starting with our program. He is using portion controlled frozen meals to aid with creating a calorie deficit as he does very little cooking. He is [employer] as a strategy for weight loss.      Intervention Plan   Intervention Prescribe, educate and counsel regarding individualized specific dietary modifications aiming towards targeted core components such as weight, hypertension, lipid management, diabetes, heart failure and  other comorbidities.;Nutrition handout(s) given to patient.    Expected Outcomes Short Term Goal: Understand basic principles of dietary content, such as calories, fat, sodium, cholesterol and nutrients.;Long Term Goal: Adherence to prescribed nutrition plan.;Short Term Goal: A plan has been developed with personal nutrition goals set during dietitian appointment.             Nutrition Discharge:  Nutrition Assessments - 05/12/22 0804       Rate Your Plate Scores   Pre Score --    Post Score 65             Education Questionnaire Score:  Knowledge Questionnaire Score - 05/10/22 1546       Knowledge Questionnaire Score   Post Score 18/18             Goals reviewed with patient; copy given to patient.

## 2022-06-02 ENCOUNTER — Telehealth: Payer: Self-pay | Admitting: Internal Medicine

## 2022-06-02 NOTE — Telephone Encounter (Signed)
Scheduled appt per 10/13 referral. Pt is aware of appt date and time. Pt is aware to arrive 15 mins prior to appt time and to bring and updated insurance card. Pt is aware of appt location.

## 2022-06-20 ENCOUNTER — Ambulatory Visit (INDEPENDENT_AMBULATORY_CARE_PROVIDER_SITE_OTHER): Payer: No Typology Code available for payment source | Admitting: Pulmonary Disease

## 2022-06-20 ENCOUNTER — Encounter: Payer: Self-pay | Admitting: Pulmonary Disease

## 2022-06-20 VITALS — BP 140/70 | HR 80 | Ht 67.0 in | Wt 225.6 lb

## 2022-06-20 DIAGNOSIS — R0609 Other forms of dyspnea: Secondary | ICD-10-CM

## 2022-06-20 DIAGNOSIS — D649 Anemia, unspecified: Secondary | ICD-10-CM | POA: Diagnosis not present

## 2022-06-20 DIAGNOSIS — M7989 Other specified soft tissue disorders: Secondary | ICD-10-CM | POA: Diagnosis not present

## 2022-06-20 DIAGNOSIS — J9611 Chronic respiratory failure with hypoxia: Secondary | ICD-10-CM

## 2022-06-20 DIAGNOSIS — J432 Centrilobular emphysema: Secondary | ICD-10-CM

## 2022-06-20 DIAGNOSIS — R5381 Other malaise: Secondary | ICD-10-CM

## 2022-06-20 LAB — BRAIN NATRIURETIC PEPTIDE: Pro B Natriuretic peptide (BNP): 63 pg/mL (ref 0.0–100.0)

## 2022-06-20 NOTE — Progress Notes (Signed)
Synopsis: Referred in November 2023 for COPD from the [place] system.  Formerly followed by Dr. Gwenette Greet at the [facility] in [place]. Quit tobacco 2014 Complete pulmonary rehab 2017 Continuing in pulmonary rehab maintenance Completed pulmonary rehab at [facility] 2023.    Subjective:   PATIENT ID: Sean [name] GENDER: male DOB: 09-Mar-1951, MRN: [id number]   HPI  No chief complaint on file. CC: here to see me for COPD  Arturo says that the [place] sent him here for COPD   He has dyspnea:  > he says that he is dependent on oxygen 24 hours a day > any exertion makes him more dyspneic > worse in the last year with physical exertion > physical exertion makes it worse > can't identify any particular triggers > doesn't take albuterol too often  He started using oxygen just a few weeks ago.  He wears it all the time right now.  He tries to take it off when   He has sleep apnea and wears a machine for that.   He's had pneumonia in the last year, took outpatient antibiotics.  He can't recall ever taking prednisone.  He's currently taking stiolto 2 puffs daily, asmanex.  He's been told that he doesn't have any sort of cardiac disease that needs an invasive procedure.   He just completed pulmonary   Last hospitalization was at [facility] for vascular surgery in 2022  Record review: Pulmonary notes from 2023 reviewed where the patient was seen in the pulmonary clinic after Dr. Gwenette Greet retired.  He was noted to have a history of COPD and had been treated with Wixela and Spiriva.  At the fall 2023 visit this was changed to Asmanex and Stiolto.  Past Medical History:  Diagnosis Date   Arthritis    COPD (chronic obstructive pulmonary disease) (HCC)    GERD (gastroesophageal reflux disease)    Hyperlipidemia    Hypertension    Sleep apnea      Family History  Problem Relation Age of Onset   COPD Mother    Diabetes Mother    Hypertension Father    COPD Father    Heart disease Father    Arthritis Sister       Social History   Socioeconomic History   Marital status: Single    Spouse name: Not on file   Number of children: Not on file   Years of education: Not on file   Highest education level: Not on file  Occupational History   Occupation: Retired  Tobacco Use   Smoking status: Former    Packs/day: 3.00    Years: 45.00    Total pack years: 135.00    Types: Cigarettes    Quit date: 06/25/2013    Years since quitting: 8.9    Passive exposure: Never   Smokeless tobacco: Former    Quit date: 06/25/1976  Vaping Use   Vaping Use: Never used  Substance and Sexual Activity   Alcohol use: Yes    Alcohol/week: 30.0 standard drinks of alcohol    Types: 30 Cans of beer per week   Drug use: No   Sexual activity: Not on file  Other Topics Concern   Not on file  Social History Narrative   Not on file   Social Determinants of Health   Financial Resource Strain: Not on file  Food Insecurity: Not on file  Transportation Needs: Not on file  Physical Activity: Not on file  Stress: Not on file  Social Connections: Not on  file  Intimate Partner Violence: Not on file     Allergies  Allergen Reactions   Amlodipine Swelling and Other (See Comments)    Edema of lower extremities     Outpatient Medications Prior to Visit  Medication Sig Dispense Refill   albuterol (PROVENTIL HFA;VENTOLIN HFA) 108 (90 BASE) MCG/ACT inhaler Inhale 2 puffs into the lungs every 6 (six) hours as needed for wheezing or shortness of breath.     albuterol (PROVENTIL) (2.5 MG/3ML) 0.083% nebulizer solution Take 2.5 mg by nebulization every 6 (six) hours as needed for wheezing or shortness of breath.     amLODipine (NORVASC) 2.5 MG tablet Take 2.5 mg by mouth daily.     amoxicillin (AMOXIL) 500 MG capsule Take 2 capsules (1,000 mg total) by mouth 2 (two) times daily. 120 capsule 1   aspirin EC 81 MG tablet Take 81 mg by mouth at bedtime. Swallow whole.     benazepril (LOTENSIN) 10 MG tablet Take 1 tablet (10  mg total) by mouth at bedtime. 90 tablet 3   Carboxymethylcellulose Sodium (THERATEARS PF OP) Place 2 drops into both eyes 3 (three) times daily.     cholecalciferol (VITAMIN D) 1000 UNITS tablet Take 1,000 Units by mouth 2 (two) times daily.     cilostazol (PLETAL) 100 MG tablet Take 100 mg by mouth 2 (two) times daily.     erythromycin ophthalmic ointment Apply to eye. (Patient not taking: Reported on 03/21/2022)     ferrous sulfate 325 (65 FE) MG tablet Take 1 tablet by mouth daily.     folic acid (FOLVITE) 1 MG tablet Take 1 mg by mouth daily.     LACTOBACILLUS PO Take 2 capsules by mouth daily.     Magnesium Oxide 420 MG TABS Take 420 mg by mouth every other day.     meloxicam (MOBIC) 15 MG tablet Take 15 mg by mouth daily.     MENTHOL-METHYL SALICYLATE EX Apply 1 application topically 2 (two) times daily as needed (for knee pain).     metoprolol (TOPROL-XL) 200 MG 24 hr tablet Take 200 mg by mouth daily.     Multiple Vitamins-Minerals (MULTIVITAMIN WITH MINERALS) tablet Take 1 tablet by mouth daily.     niacin 100 MG tablet Take 100 mg by mouth in the morning.     Omega-3 Fatty Acids (FISH OIL) 1000 MG CAPS Take 1,000 mg by mouth daily.     pantoprazole (PROTONIX) 40 MG tablet Take 40 mg by mouth 2 (two) times daily.     rosuvastatin (CRESTOR) 20 MG tablet Take 10 mg by mouth daily.     thiamine 100 MG tablet Take 100 mg by mouth daily.     carboxymethylcellulose (REFRESH PLUS) 0.5 % SOLN Place 1 drop into both eyes 3 (three) times daily.     Cholecalciferol (VITAMIN D3) 25 MCG (1000 UT) CAPS Take 1,000 Units by mouth 2 (two) times daily.     hydrochlorothiazide (HYDRODIURIL) 25 MG tablet Take 0.5 tablets by mouth daily.     hydrocortisone (ANUSOL-HC) 2.5 % rectal cream Place 1 application rectally 3 (three) times daily as needed for hemorrhoids or anal itching.     hydrocortisone 2.5 % cream Apply 1 application topically 3 (three) times daily as needed (itching). rectally      spironolactone (ALDACTONE) 25 MG tablet Take 25 mg by mouth daily.     Facility-Administered Medications Prior to Visit  Medication Dose Route Frequency Provider Last Rate Last Admin   0.9 %  sodium chloride infusion  250 mL Intravenous PRN Baglia, Corrina, PA-C       acetaminophen (TYLENOL) tablet 325-650 mg  325-650 mg Oral Q4H PRN Baglia, Corrina, PA-C       Or   acetaminophen (TYLENOL) suppository 320-640 mg  320-640 mg Rectal Q4H PRN Baglia, Corrina, PA-C       alum & mag hydroxide-simeth (MAALOX/MYLANTA) 200-200-20 MG/5ML suspension 15-30 mL  15-30 mL Oral Q2H PRN Baglia, Corrina, PA-C       docusate sodium (COLACE) capsule 100 mg  100 mg Oral BID Baglia, Corrina, PA-C       guaiFENesin-dextromethorphan (ROBITUSSIN DM) 100-10 MG/5ML syrup 15 mL  15 mL Oral Q4H PRN Baglia, Corrina, PA-C       heparin injection 5,000 Units  5,000 Units Subcutaneous Q8H Baglia, Corrina, PA-C       hydrALAZINE (APRESOLINE) injection 5 mg  5 mg Intravenous Q20 Min PRN Baglia, Corrina, PA-C       labetalol (NORMODYNE) injection 10 mg  10 mg Intravenous Q10 min PRN Baglia, Corrina, PA-C       metoprolol tartrate (LOPRESSOR) injection 2-5 mg  2-5 mg Intravenous Q2H PRN Baglia, Corrina, PA-C       ondansetron (ZOFRAN) injection 4 mg  4 mg Intravenous Q6H PRN Baglia, Corrina, PA-C       phenol (CHLORASEPTIC) mouth spray 1 spray  1 spray Mouth/Throat PRN Baglia, Corrina, PA-C       senna-docusate (Senokot-S) tablet 1 tablet  1 tablet Oral QHS PRN Baglia, Corrina, PA-C       sodium chloride flush (NS) 0.9 % injection 3 mL  3 mL Intravenous Q12H Baglia, Corrina, PA-C       sodium chloride flush (NS) 0.9 % injection 3 mL  3 mL Intravenous PRN Baglia, Corrina, PA-C        Review of Systems  Constitutional:  Positive for malaise/fatigue. Negative for chills, fever and weight loss.  HENT:  Negative for congestion, nosebleeds, sinus pain and sore throat.   Eyes:  Negative for photophobia, pain and discharge.   Respiratory:  Positive for shortness of breath. Negative for cough, hemoptysis, sputum production and wheezing.   Cardiovascular:  Positive for leg swelling. Negative for chest pain, palpitations and orthopnea.  Gastrointestinal:  Negative for abdominal pain, constipation, diarrhea, nausea and vomiting.  Genitourinary:  Negative for dysuria, frequency, hematuria and urgency.  Musculoskeletal:  Negative for back pain, joint pain, myalgias and neck pain.  Skin:  Negative for itching and rash.  Neurological:  Negative for tingling, tremors, sensory change, speech change, focal weakness, seizures, weakness and headaches.  Psychiatric/Behavioral:  Negative for memory loss, substance abuse and suicidal ideas. The patient is not nervous/anxious.       Objective:  Physical Exam   Vitals:   06/20/22 0859  BP: (!) 140/70  Pulse: 80  SpO2: 95%  Weight: 225 lb 9.6 oz (102.3 kg)  Height: '5\' 7"'$  (1.702 m)    2L [place]  Gen: chronically ill appearing, no acute distress HENT: NCAT, OP clear, neck supple without masses Eyes: PERRL, EOMi Lymph: no cervical lymphadenopathy PULM: Coarse crackles bases B CV: RRR, no mgr, no JVD GI: BS+, soft, nontender, no hsm Derm: no rash or skin breakdown, edema noted, wearing compression stockings MSK: normal bulk and tone Neuro: A&Ox4, CN II-XII intact, strength 5/5 in all 4 extremities Psyche: normal mood and affect   CBC    Component Value Date/Time   WBC 7.8 08/18/2021 1359   RBC 3.94 (  L) 08/18/2021 1359   HGB 11.9 (L) 08/18/2021 1359   HCT 37.1 (L) 08/18/2021 1359   PLT 279 08/18/2021 1359   MCV 94.2 08/18/2021 1359   MCH 30.2 08/18/2021 1359   MCHC 32.1 08/18/2021 1359   RDW 16.7 (H) 08/18/2021 1359   LYMPHSABS 1.7 08/18/2021 1359   MONOABS 0.7 08/18/2021 1359   EOSABS 0.1 08/18/2021 1359   BASOSABS 0.1 08/18/2021 1359     Chest imaging: CT chest 2014: no acute process, extensive atherosclerosis CXR 06/2014: no acute process July 2023  CT chest showed no suspicious pulmonary nodules or masses, no right lower lobe pulmonary nodule seen limited by respiratory motion artifact, marked aortic coronary atherosclerotic calcification  PFT: PFT's 2009: FEV1 2.13 (68%), ratio 62 PFT's 01/2014: FEV1 1.64 (57%), ratio 55 PFT 09/2020: FEV1 1.21 (44%)>> 1.40 (51%), 16% BD, ratio 53, TLC 91%, DLCO 51%  Labs: October 2023 CBC hemoglobin 10.1, white blood cell count 5.7, no differential reported  Path:  Echo: Echo 10/2020: nl EF, LVH/DD, RV nl, no pulm htn 09-08-21 Mild Concentric LVH, LVSF is normal. EF >= 55% No regional wall motion  abnormalities Mild Valvular aortic stenosis Trace MR and Trace TR RVSP is estimated at  90m Hg Ascending Aorta 4.0cm Mildly dilated ascending aorta.  Heart Catheterization:   Nuclear stress 05/2015: low risk Nuclear stress testing 10/2020: no ischemia, low risk study  Ambulatory oximetry: Ambulatory Ox 05/2015: desat to 90% at [id number] walking. Ambulatory Ox 08/2020: 90% at [id number] Echo      Assessment & Plan:   Centrilobular emphysema (HCC)  Chronic respiratory failure with hypoxia (HCC)  Leg swelling  Anemia, unspecified type  Dyspnea on exertion  Physical deconditioning  Discussion: TroNicholausmes to see me today primarily for shortness of breath in the context of severe COPD, chronic respiratory failure with hypoxemia, physical deconditioning, and recently diagnosed anemia.  From a COPD standpoint he is really received optimal therapy up until this point as he is on a good medical regimen, he has completed and has been consistent with pulmonary rehab exercise-based therapy for some time, and now he is using oxygen.  I worry that the anemia is making him more short of breath.  In addition, based on his physical exam I am concerned about the possibility of heart failure.  Plan: Shortness of breath with leg swelling: I am going to check a blood test called BNP to look for heart  failure  COPD: Continue Stiolto 2 puffs daily as you are doing Continue Asmanex 2 puffs daily as you are doing We will check a full pulmonary function test  Chronic respiratory failure with hypoxemia: Continue 2 L of oxygen when you exert yourself We will check a high-resolution CT scan of the chest to ensure that there is nothing else going on besides COPD  Obstructive sleep apnea: We will check an overnight oximetry test when you are using CPAP Keep using CPAP as you are doing  Anemia: I am glad that you are about to see hematology, I will be on the look out for those results  Follow-up with me in about 6 weeks to go over the results of the lung function test and CT scan.  Immunizations: Immunization History  Administered Date(s) Administered   Fluad Quad(high Dose 65+) 06/22/2020, 09/27/2021   H1N1 08/15/2008   Hepatitis A, Adult 06/21/1997, 02/22/1998   Influenza Whole 06/21/1998, 08/22/1999, 07/10/2000, 06/28/2001, 06/16/2002, 07/28/2005, 06/09/2006   Influenza,inj,Quad PF,6+ Mos 06/01/2016, 06/12/2017, 05/23/2018  Influenza-Unspecified 08/28/2003, 06/19/2004, 06/09/2006, 07/16/2007, 05/31/2008, 08/15/2008, 09/20/2008, 07/14/2009, 08/13/2010, 07/23/2012, 06/11/2013, 07/08/2014, 05/18/2015, 05/23/2019, 10/16/2021, 05/25/2022, 05/25/2022   Meningococcal Polysaccharide 11/13/1993, 07/10/2000   Moderna Covid-19 Vaccine Bivalent Booster 70yr & up 05/05/2021   Moderna Sars-Covid-2 Vaccination 09/25/2019, 10/23/2019, 05/12/2020, 12/02/2020   OPV 08/16/1971   PPD Test 06/21/1998, 08/22/1999, 02/06/2001, 01/16/2002   Pneumococcal Conjugate-13 11/22/2017   Pneumococcal Polysaccharide-23 08/06/2019   Pneumococcal-Unspecified 08/26/2009   Td 11/13/1993, 01/20/2004   Td (Adult), 2 Lf Tetanus Toxid, Preservative Free 11/13/1993, 01/20/2004   Td (Adult),5 Lf Tetanus Toxid, Preservative Free 10/14/2021   Tdap 02/24/2012   Typhoid Parenteral 06/21/2000   Typhoid Parenteral, AKD ([place] [employer]) 11/13/1993   Unspecified SARS-COV-2 Vaccination 05/25/2022   Varicella 02/03/2006   Yellow Fever 12/14/1994   Zoster Recombinat (Shingrix) 02/03/2020, 06/03/2020     Current Outpatient Medications:    albuterol (PROVENTIL HFA;VENTOLIN HFA) 108 (90 BASE) MCG/ACT inhaler, Inhale 2 puffs into the lungs every 6 (six) hours as needed for wheezing or shortness of breath., Disp: , Rfl:    albuterol (PROVENTIL) (2.5 MG/3ML) 0.083% nebulizer solution, Take 2.5 mg by nebulization every 6 (six) hours as needed for wheezing or shortness of breath., Disp: , Rfl:    amLODipine (NORVASC) 2.5 MG tablet, Take 2.5 mg by mouth daily., Disp: , Rfl:    amoxicillin (AMOXIL) 500 MG capsule, Take 2 capsules (1,000 mg total) by mouth 2 (two) times daily., Disp: 120 capsule, Rfl: 1   aspirin EC 81 MG tablet, Take 81 mg by mouth at bedtime. Swallow whole., Disp: , Rfl:    benazepril (LOTENSIN) 10 MG tablet, Take 1 tablet (10 mg total) by mouth at bedtime., Disp: 90 tablet, Rfl: 3   Carboxymethylcellulose Sodium (THERATEARS PF OP), Place 2 drops into both eyes 3 (three) times daily., Disp: , Rfl:    cholecalciferol (VITAMIN D) 1000 UNITS tablet, Take 1,000 Units by mouth 2 (two) times daily., Disp: , Rfl:    cilostazol (PLETAL) 100 MG tablet, Take 100 mg by mouth 2 (two) times daily., Disp: , Rfl:    erythromycin ophthalmic ointment, Apply to eye. (Patient not taking: Reported on 03/21/2022), Disp: , Rfl:    ferrous sulfate 325 (65 FE) MG tablet, Take 1 tablet by mouth daily., Disp: , Rfl:    folic acid (FOLVITE) 1 MG tablet, Take 1 mg by mouth daily., Disp: , Rfl:    LACTOBACILLUS PO, Take 2 capsules by mouth daily., Disp: , Rfl:    Magnesium Oxide 420 MG TABS, Take 420 mg by mouth every other day., Disp: , Rfl:    meloxicam (MOBIC) 15 MG tablet, Take 15 mg by mouth daily., Disp: , Rfl:    MENTHOL-METHYL SALICYLATE EX, Apply 1 application topically 2 (two) times daily as needed (for knee pain)., Disp: , Rfl:     metoprolol (TOPROL-XL) 200 MG 24 hr tablet, Take 200 mg by mouth daily., Disp: , Rfl:    Multiple Vitamins-Minerals (MULTIVITAMIN WITH MINERALS) tablet, Take 1 tablet by mouth daily., Disp: , Rfl:    niacin 100 MG tablet, Take 100 mg by mouth in the morning., Disp: , Rfl:    Omega-3 Fatty Acids (FISH OIL) 1000 MG CAPS, Take 1,000 mg by mouth daily., Disp: , Rfl:    pantoprazole (PROTONIX) 40 MG tablet, Take 40 mg by mouth 2 (two) times daily., Disp: , Rfl:    rosuvastatin (CRESTOR) 20 MG tablet, Take 10 mg by mouth daily., Disp: , Rfl:    thiamine 100 MG tablet, Take  100 mg by mouth daily., Disp: , Rfl:   Current Facility-Administered Medications:    0.9 %  sodium chloride infusion, 250 mL, Intravenous, PRN, Baglia, Corrina, PA-C   alum & mag hydroxide-simeth (MAALOX/MYLANTA) 200-200-20 MG/5ML suspension 15-30 mL, 15-30 mL, Oral, Q2H PRN, Baglia, Corrina, PA-C   docusate sodium (COLACE) capsule 100 mg, 100 mg, Oral, BID, Baglia, Corrina, PA-C   guaiFENesin-dextromethorphan (ROBITUSSIN DM) 100-10 MG/5ML syrup 15 mL, 15 mL, Oral, Q4H PRN, Baglia, Corrina, PA-C   heparin injection 5,000 Units, 5,000 Units, Subcutaneous, Q8H, Baglia, Corrina, PA-C   hydrALAZINE (APRESOLINE) injection 5 mg, 5 mg, Intravenous, Q20 Min PRN, Baglia, Corrina, PA-C   labetalol (NORMODYNE) injection 10 mg, 10 mg, Intravenous, Q10 min PRN, Baglia, Corrina, PA-C   metoprolol tartrate (LOPRESSOR) injection 2-5 mg, 2-5 mg, Intravenous, Q2H PRN, Baglia, Corrina, PA-C   ondansetron (ZOFRAN) injection 4 mg, 4 mg, Intravenous, Q6H PRN, Baglia, Corrina, PA-C   phenol (CHLORASEPTIC) mouth spray 1 spray, 1 spray, Mouth/Throat, PRN, Baglia, Corrina, PA-C   senna-docusate (Senokot-S) tablet 1 tablet, 1 tablet, Oral, QHS PRN, Baglia, Corrina, PA-C   sodium chloride flush (NS) 0.9 % injection 3 mL, 3 mL, Intravenous, Q12H, Baglia, Corrina, PA-C   sodium chloride flush (NS) 0.9 % injection 3 mL, 3 mL, Intravenous, PRN, Baglia, Corrina,  PA-C  Facility-Administered Medications Ordered in Other Visits:    acetaminophen (TYLENOL) tablet 325-650 mg, 325-650 mg, Oral, Q4H PRN **OR** acetaminophen (TYLENOL) suppository 320-640 mg, 320-640 mg, Rectal, Q4H PRN, Baglia, Corrina, PA-C

## 2022-06-20 NOTE — Patient Instructions (Signed)
Shortness of breath with leg swelling: I am going to check a blood test called BNP to look for heart failure  COPD: Continue Stiolto 2 puffs daily as you are doing Continue Asmanex 2 puffs daily as you are doing We will check a full pulmonary function test  Chronic respiratory failure with hypoxemia: Continue 2 L of oxygen when you exert yourself We will check a high-resolution CT scan of the chest to ensure that there is nothing else going on besides COPD  Obstructive sleep apnea: We will check an overnight oximetry test when you are using CPAP Keep using CPAP as you are doing  Anemia: I am glad that you are about to see hematology, I will be on the look out for those results  Follow-up with me in about 6 weeks to go over the results of the lung function test and CT scan.

## 2022-06-21 ENCOUNTER — Other Ambulatory Visit: Payer: Self-pay | Admitting: Internal Medicine

## 2022-06-21 DIAGNOSIS — D539 Nutritional anemia, unspecified: Secondary | ICD-10-CM

## 2022-06-22 ENCOUNTER — Encounter: Payer: Self-pay | Admitting: Internal Medicine

## 2022-06-22 ENCOUNTER — Inpatient Hospital Stay: Payer: No Typology Code available for payment source

## 2022-06-22 ENCOUNTER — Inpatient Hospital Stay: Payer: No Typology Code available for payment source | Attending: Internal Medicine | Admitting: Internal Medicine

## 2022-06-22 VITALS — BP 159/65 | HR 87 | Temp 98.1°F | Resp 18 | Ht 67.0 in | Wt 223.3 lb

## 2022-06-22 DIAGNOSIS — D539 Nutritional anemia, unspecified: Secondary | ICD-10-CM | POA: Diagnosis present

## 2022-06-22 LAB — CBC WITH DIFFERENTIAL (CANCER CENTER ONLY)
Abs Immature Granulocytes: 0.02 10*3/uL (ref 0.00–0.07)
Basophils Absolute: 0.1 10*3/uL (ref 0.0–0.1)
Basophils Relative: 1 %
Eosinophils Absolute: 0.1 10*3/uL (ref 0.0–0.5)
Eosinophils Relative: 2 %
HCT: 30.9 % — ABNORMAL LOW (ref 39.0–52.0)
Hemoglobin: 9.8 g/dL — ABNORMAL LOW (ref 13.0–17.0)
Immature Granulocytes: 0 %
Lymphocytes Relative: 29 %
Lymphs Abs: 1.8 10*3/uL (ref 0.7–4.0)
MCH: 28.2 pg (ref 26.0–34.0)
MCHC: 31.7 g/dL (ref 30.0–36.0)
MCV: 89 fL (ref 80.0–100.0)
Monocytes Absolute: 0.6 10*3/uL (ref 0.1–1.0)
Monocytes Relative: 10 %
Neutro Abs: 3.6 10*3/uL (ref 1.7–7.7)
Neutrophils Relative %: 58 %
Platelet Count: 298 10*3/uL (ref 150–400)
RBC: 3.47 MIL/uL — ABNORMAL LOW (ref 4.22–5.81)
RDW: 20.1 % — ABNORMAL HIGH (ref 11.5–15.5)
WBC Count: 6.2 10*3/uL (ref 4.0–10.5)
nRBC: 0 % (ref 0.0–0.2)

## 2022-06-22 LAB — IRON AND IRON BINDING CAPACITY (CC-WL,HP ONLY)
Iron: 20 ug/dL — ABNORMAL LOW (ref 45–182)
Saturation Ratios: 5 % — ABNORMAL LOW (ref 17.9–39.5)
TIBC: 435 ug/dL (ref 250–450)
UIBC: 415 ug/dL — ABNORMAL HIGH (ref 117–376)

## 2022-06-22 LAB — VITAMIN B12: Vitamin B-12: 440 pg/mL (ref 180–914)

## 2022-06-22 LAB — FOLATE: Folate: 38.2 ng/mL (ref >5.9)

## 2022-06-22 LAB — TSH: TSH: 2.053 u[IU]/mL (ref 0.350–4.500)

## 2022-06-22 LAB — FERRITIN: Ferritin: 5 ng/mL — ABNORMAL LOW (ref 24–336)

## 2022-06-22 NOTE — Progress Notes (Signed)
[place] [phone]   [phone]  CONSULT NOTE  REFERRING PHYSICIAN: [facility]  REASON FOR CONSULTATION:  71 years old white male with persistent anemia.  HPI Sean [name] is a 71 y.o. male with past medical history significant for COPD, hypertension, dyslipidemia, sleep apnea, osteoarthritis and GERD.  The patient is currently followed by the [place] system in [facility] and he was noted on recent blood work to have persistent anemia.  He had iron studies that showed evidence for iron deficiency.  He was referred to [place] for further evaluation and recommendation regarding his condition.  The patient mentioned that his last colonoscopy was around 8 years ago and was done at the [place] system.  He was supposed to have repeat colonoscopy in 10 years from the previous one.  He had some polyp removed at that time.  The patient also had imaging studies in January 2023 that showed suspicious pulmonary nodule.  He was seen by Dr. Lake Bells from [facility] pulmonary medicine and scheduled to have repeat scan of the chest later this month.  He has not been on any iron supplements but he has an order from his [place] for iron supplement but was not delivered to his house yet. When seen today he is feeling fine except for the fatigue and sleep apnea but no significant dizzy spells.  He has no chest pain but has shortness of breath at baseline increased with exertion with no cough or hemoptysis.  He has no nausea, vomiting, diarrhea or constipation.  He has no craving for ice or starch. Family history significant for mother with COPD and diabetes mellitus.  Father had COPD and hypertension. The patient is single and has no children.  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  He has a history of smoking more than 3 packs/day for around 45 years but quit 10 years ago.  He drinks 4-5 beers every day.  He has no history of  drug abuse.  HPI  Past Medical History:  Diagnosis Date   Arthritis    COPD (chronic obstructive pulmonary disease) ([place])    GERD (gastroesophageal reflux disease)    Hyperlipidemia    Hypertension    Sleep apnea     Past Surgical History:  Procedure Laterality Date   ABDOMINAL AORTOGRAM W/LOWER EXTREMITY Bilateral 09/28/2020   Procedure: ABDOMINAL AORTOGRAM W/LOWER EXTREMITY;  Surgeon: Waynetta Sandy, MD;  Location: [place] [facility];  Service: Cardiovascular;  Laterality: Bilateral;   APPLICATION OF WOUND VAC Right 01/12/2021   Procedure: APPLICATION OF WOUND VAC;  Surgeon: Waynetta Sandy, MD;  Location: [place];  Service: Vascular;  Laterality: Right;   BACK SURGERY     ENDARTERECTOMY FEMORAL Left 11/04/2020   Procedure: LEFT COMMON FEMORAL ARTERY ENDARTERECTOMY;  Surgeon: Waynetta Sandy, MD;  Location: [place];  Service: Vascular;  Laterality: Left;   EYE SURGERY     FEMORAL-FEMORAL BYPASS GRAFT Bilateral 11/04/2020   Procedure: RIGHT TO LEFT FEMORAL-FEMORAL BYPASS;  Surgeon: Waynetta Sandy, MD;  Location: [place];  Service: Vascular;  Laterality: Bilateral;   HERNIA REPAIR     INCISION AND DRAINAGE OF WOUND Right 01/06/2021   Procedure: IRRIGATION AND DEBRIDEMENT GROIN;  Surgeon: Serafina Mitchell, MD;  Location: [facility];  Service: Vascular;  Laterality: Right;   INCISION AND DRAINAGE OF WOUND Right 01/12/2021   Procedure: V[place]  FLAP;  Surgeon: Waynetta Sandy, MD;  Location: [place];  Service: Vascular;  Laterality: Right;   PATCH ANGIOPLASTY Bilateral 11/04/2020   Procedure: PATCH ANGIOPLASTY LEFT AND RIGHT COMMON FEMORAL ARTERY;  Surgeon: Waynetta Sandy, MD;  Location: [place];  Service: Vascular;  Laterality: Bilateral;   ULTRASOUND GUIDANCE FOR VASCULAR ACCESS Bilateral 11/04/2020   Procedure: ULTRASOUND GUIDANCE FOR VASCULAR ACCESS;  Surgeon: Waynetta Sandy, MD;  Location: [facility] OR;  Service:  Vascular;  Laterality: Bilateral;   WISDOM TOOTH EXTRACTION      Family History  Problem Relation Age of Onset   COPD Mother    Diabetes Mother    Hypertension Father    COPD Father    Heart disease Father    Arthritis Sister     Social History Social History   Tobacco Use   Smoking status: Former    Packs/day: 3.00    Years: 45.00    Total pack years: 135.00    Types: Cigarettes    Quit date: 06/25/2013    Years since quitting: 8.9    Passive exposure: Never   Smokeless tobacco: Former    Quit date: 06/25/1976  Vaping Use   Vaping Use: Never used  Substance Use Topics   Alcohol use: Yes    Alcohol/week: 30.0 standard drinks of alcohol    Types: 30 Cans of beer per week   Drug use: No    Allergies  Allergen Reactions   Amlodipine Swelling and Other (See Comments)    Edema of lower extremities    Current Outpatient Medications  Medication Sig Dispense Refill   albuterol (PROVENTIL HFA;VENTOLIN HFA) 108 (90 BASE) MCG/ACT inhaler Inhale 2 puffs into the lungs every 6 (six) hours as needed for wheezing or shortness of breath.     albuterol (PROVENTIL) (2.5 MG/3ML) 0.083% nebulizer solution Take 2.5 mg by nebulization every 6 (six) hours as needed for wheezing or shortness of breath.     amLODipine (NORVASC) 2.5 MG tablet Take 2.5 mg by mouth daily.     amoxicillin (AMOXIL) 500 MG capsule Take 2 capsules (1,000 mg total) by mouth 2 (two) times daily. 120 capsule 1   aspirin EC 81 MG tablet Take 81 mg by mouth at bedtime. Swallow whole.     benazepril (LOTENSIN) 10 MG tablet Take 1 tablet (10 mg total) by mouth at bedtime. 90 tablet 3   Carboxymethylcellulose Sodium (THERATEARS PF OP) Place 2 drops into both eyes 3 (three) times daily.     cholecalciferol (VITAMIN D) 1000 UNITS tablet Take 1,000 Units by mouth 2 (two) times daily.     cilostazol (PLETAL) 100 MG tablet Take 100 mg by mouth 2 (two) times daily.     erythromycin ophthalmic ointment Apply to eye. (Patient  not taking: Reported on 03/21/2022)     ferrous sulfate 325 (65 FE) MG tablet Take 1 tablet by mouth daily.     folic acid (FOLVITE) 1 MG tablet Take 1 mg by mouth daily.     LACTOBACILLUS PO Take 2 capsules by mouth daily.     Magnesium Oxide 420 MG TABS Take 420 mg by mouth every other day.     MENTHOL-METHYL SALICYLATE EX Apply 1 application topically 2 (two) times daily as needed (for knee pain).     metoprolol (TOPROL-XL) 200 MG 24 hr tablet Take 200 mg by mouth daily.     Multiple Vitamins-Minerals (MULTIVITAMIN WITH MINERALS) tablet Take 1 tablet by mouth daily.  niacin 100 MG tablet Take 100 mg by mouth in the morning.     Omega-3 Fatty Acids (FISH OIL) 1000 MG CAPS Take 1,000 mg by mouth daily.     pantoprazole (PROTONIX) 40 MG tablet Take 40 mg by mouth 2 (two) times daily.     rosuvastatin (CRESTOR) 20 MG tablet Take 10 mg by mouth daily.     thiamine 100 MG tablet Take 100 mg by mouth daily.     Current Facility-Administered Medications  Medication Dose Route Frequency Provider Last Rate Last Admin   0.9 %  sodium chloride infusion  250 mL Intravenous PRN Baglia, Corrina, PA-C       alum & mag hydroxide-simeth (MAALOX/MYLANTA) 200-200-20 MG/5ML suspension 15-30 mL  15-30 mL Oral Q2H PRN Baglia, Corrina, PA-C       docusate sodium (COLACE) capsule 100 mg  100 mg Oral BID Baglia, Corrina, PA-C       guaiFENesin-dextromethorphan (ROBITUSSIN DM) 100-10 MG/5ML syrup 15 mL  15 mL Oral Q4H PRN Baglia, Corrina, PA-C       heparin injection 5,000 Units  5,000 Units Subcutaneous Q8H Baglia, Corrina, PA-C       hydrALAZINE (APRESOLINE) injection 5 mg  5 mg Intravenous Q20 Min PRN Baglia, Corrina, PA-C       labetalol (NORMODYNE) injection 10 mg  10 mg Intravenous Q10 min PRN Baglia, Corrina, PA-C       metoprolol tartrate (LOPRESSOR) injection 2-5 mg  2-5 mg Intravenous Q2H PRN Baglia, Corrina, PA-C       ondansetron (ZOFRAN) injection 4 mg  4 mg Intravenous Q6H PRN Baglia, Corrina, PA-C        phenol (CHLORASEPTIC) mouth spray 1 spray  1 spray Mouth/Throat PRN Baglia, Corrina, PA-C       senna-docusate (Senokot-S) tablet 1 tablet  1 tablet Oral QHS PRN Baglia, Corrina, PA-C       sodium chloride flush (NS) 0.9 % injection 3 mL  3 mL Intravenous Q12H Baglia, Corrina, PA-C       sodium chloride flush (NS) 0.9 % injection 3 mL  3 mL Intravenous PRN Baglia, Corrina, PA-C       Facility-Administered Medications Ordered in Other Visits  Medication Dose Route Frequency Provider Last Rate Last Admin   acetaminophen (TYLENOL) tablet 325-650 mg  325-650 mg Oral Q4H PRN Baglia, Corrina, PA-C       Or   acetaminophen (TYLENOL) suppository 320-640 mg  320-640 mg Rectal Q4H PRN Baglia, Corrina, PA-C        Review of Systems  Constitutional: positive for fatigue Eyes: negative Ears, nose, mouth, throat, and face: negative Respiratory: positive for dyspnea on exertion Cardiovascular: negative Gastrointestinal: negative Genitourinary:negative Integument/breast: negative Hematologic/lymphatic: negative Musculoskeletal:negative Neurological: negative Behavioral/Psych: negative Endocrine: negative Allergic/Immunologic: negative  Physical Exam  NKN:LZJQB, healthy, no distress, well nourished, well developed, and obese SKIN: skin color, texture, turgor are normal, no rashes or significant lesions HEAD: Normocephalic, No masses, lesions, tenderness or abnormalities EYES: normal, PERRLA, Conjunctiva are pink and non-injected EARS: External ears normal, Canals clear OROPHARYNX:no exudate, no erythema, and lips, buccal mucosa, and tongue normal  NECK: supple, no adenopathy, no JVD LYMPH:  no palpable lymphadenopathy, no hepatosplenomegaly LUNGS: clear to auscultation , and palpation HEART: regular rate & rhythm, no murmurs, and no gallops ABDOMEN:abdomen soft, non-tender, obese, normal bowel sounds, and no masses or organomegaly BACK: Back symmetric, no curvature., No CVA  tenderness EXTREMITIES:no joint deformities, effusion, or inflammation, no edema  NEURO: alert & oriented x  3 with fluent speech, no focal motor/sensory deficits  PERFORMANCE STATUS: ECOG 1  LABORATORY DATA: Lab Results  Component Value Date   WBC 6.2 06/22/2022   HGB 9.8 (L) 06/22/2022   HCT 30.9 (L) 06/22/2022   MCV 89.0 06/22/2022   PLT 298 06/22/2022      Chemistry      Component Value Date/Time   NA 128 (L) 08/18/2021 1359   NA 134 10/13/2020 0741   K 4.2 08/18/2021 1359   CL 92 (L) 08/18/2021 1359   CO2 26 08/18/2021 1359   BUN 13 08/18/2021 1359   BUN 11 10/13/2020 0741   CREATININE 0.95 08/18/2021 1359   CREATININE 0.87 04/05/2021 0903      Component Value Date/Time   CALCIUM 9.4 08/18/2021 1359   ALKPHOS 76 01/04/2021 1638   AST 29 01/04/2021 1638   ALT 29 01/04/2021 1638   BILITOT 0.8 01/04/2021 1638       RADIOGRAPHIC STUDIES: No results found.  ASSESSMENT: This is a very pleasant 71 years old white male with iron deficiency anemia with unclear etiology but likely could be secondary to gastrointestinal blood loss and the patient with long history of alcohol abuse.   PLAN: I had a lengthy discussion with the patient today about his current condition and further investigation to confirm his diagnosis as well as possible treatment options. I ordered several studies today including repeat CBC that showed hemoglobin of 9.8 and hematocrit 30.9% MCV was 89.0. Iron studies showed low serum iron of 21 with iron saturation of 5%.  Ferritin level still pending.  He has normal serum folate and vitamin B12 level.  I ordered several other studies that are still pending including erythropoietin level, blood for heavy metal, TSH, serum protein electrophoresis with immunofixation as well as stool for Hemoccult. I will arrange for the patient to receive iron infusion with Venofer 300 Mg IV weekly for 3 weeks. He was also advised to start taking the oral iron supplements once  he received it at home. If the Hemoccult is positive, will refer the patient to gastroenterology for consideration of repeat colonoscopy or upper endoscopy. The patient will come back for follow-up visit in 3 months for evaluation with repeat blood work. He was advised to call immediately if he has any other concerning symptoms in the interval. The patient voices understanding of current disease status and treatment options and is in agreement with the current care plan.  All questions were answered. The patient knows to call the clinic with any problems, questions or concerns. We can certainly see the patient much sooner if necessary.  Thank you so much for allowing me to participate in the care of Sean [name]. I will continue to follow up the patient with you and assist in his care.  The total time spent in the appointment was 60 minutes.  Disclaimer: This note was dictated with voice recognition software. Similar sounding words can inadvertently be transcribed and may not be corrected upon review.   Eilleen Kempf June 22, 2022, 12:15 PM

## 2022-06-23 ENCOUNTER — Telehealth: Payer: Self-pay | Admitting: Pharmacy Technician

## 2022-06-23 LAB — ERYTHROPOIETIN: Erythropoietin: 267.6 m[IU]/mL — ABNORMAL HIGH (ref 2.6–18.5)

## 2022-06-23 NOTE — Telephone Encounter (Addendum)
Auth Submission: APPROVED- COMMUNITY OF CARE REQUEST  Payer: [facility] -  Medication & CPT/J Code(s) submitted: Venofer (Iron Sucrose) J1756 Route of submission (phone, fax, portal):  Phone # [phone] [facility])  Fax # Auth type: Buy/Bill Units/visits requested: VENOFER Reference number: [id number] Approval from:  to  at [place]

## 2022-06-24 ENCOUNTER — Other Ambulatory Visit: Payer: Self-pay | Admitting: *Deleted

## 2022-06-24 DIAGNOSIS — D539 Nutritional anemia, unspecified: Secondary | ICD-10-CM | POA: Diagnosis not present

## 2022-06-24 LAB — OCCULT BLOOD X 1 CARD TO LAB, STOOL
Fecal Occult Bld: POSITIVE — AB
Fecal Occult Bld: POSITIVE — AB
Fecal Occult Bld: POSITIVE — AB

## 2022-06-24 LAB — HEAVY METALS, BLOOD
Arsenic: 2 ug/L (ref 0–9)
Lead: <1 ug/dL (ref 0.0–3.4)
Mercury: <1 ug/L (ref 0.0–14.9)

## 2022-06-27 ENCOUNTER — Telehealth: Payer: Self-pay | Admitting: Medical Oncology

## 2022-06-27 NOTE — Telephone Encounter (Signed)
-----   Message from Curt Bears, MD sent at 06/25/2022  2:34 PM EST ----- His stool card for Hemoccult are positive.  The patient will need to call his gastroenterology at the [facility] to schedule a colonoscopy and upper endoscopy.  If he does not have a gastroenterologist there, we will be happy to refer him to [place].  Thank you ----- Message ----- From: Buel Ream, Lab In [place] Sent: 06/24/2022   8:28 AM EST To: Curt Bears, MD

## 2022-06-27 NOTE — Telephone Encounter (Addendum)
Lab and progress note and occult blood tests faxed to Dr Durene Fruits .  Spoke to [place] at the [place] who gave me fax number to Dr. Durene Fruits.  Pt notified of hemocult results. He said his PCP at the [facility] is Dr. Lora Paula, MD.

## 2022-06-28 ENCOUNTER — Other Ambulatory Visit: Payer: Self-pay | Admitting: Pharmacy Technician

## 2022-06-29 LAB — PROTEIN ELECTROPHORESIS, SERUM, WITH REFLEX
A/G Ratio: 1.2 (ref 0.7–1.7)
Albumin ELP: 4.1 g/dL (ref 2.9–4.4)
Alpha-1-Globulin: 0.3 g/dL (ref 0.0–0.4)
Alpha-2-Globulin: 0.8 g/dL (ref 0.4–1.0)
Beta Globulin: 1.1 g/dL (ref 0.7–1.3)
Gamma Globulin: 1.2 g/dL (ref 0.4–1.8)
Globulin, Total: 3.3 g/dL (ref 2.2–3.9)
M-Spike, %: 0.3 g/dL — ABNORMAL HIGH
SPEP Interpretation: 0
Total Protein ELP: 7.4 g/dL (ref 6.0–8.5)

## 2022-06-29 LAB — IMMUNOFIXATION REFLEX, SERUM
IgA: 356 mg/dL (ref 61–437)
IgG (Immunoglobin G), Serum: 1189 mg/dL (ref 603–1613)
IgM (Immunoglobulin M), Srm: 208 mg/dL — ABNORMAL HIGH (ref 20–172)

## 2022-06-30 ENCOUNTER — Telehealth: Payer: Self-pay | Admitting: Medical Oncology

## 2022-06-30 ENCOUNTER — Encounter: Payer: Self-pay | Admitting: Internal Medicine

## 2022-06-30 NOTE — Telephone Encounter (Signed)
Colonoscopy-Per pt this am -I called [facility] for [facility] for [facility] to set up his colonoscopy.

## 2022-06-30 NOTE — Telephone Encounter (Signed)
LVM to return my call and leave specific information he has from the [place] re colonoscopy.

## 2022-07-01 ENCOUNTER — Ambulatory Visit (HOSPITAL_BASED_OUTPATIENT_CLINIC_OR_DEPARTMENT_OTHER)
Admission: RE | Admit: 2022-07-01 | Discharge: 2022-07-01 | Disposition: A | Discharge status: Home / Self Care | Payer: No Typology Code available for payment source | Source: Ambulatory Visit | Attending: Pulmonary Disease | Admitting: Pulmonary Disease

## 2022-07-01 DIAGNOSIS — R0609 Other forms of dyspnea: Secondary | ICD-10-CM | POA: Insufficient documentation

## 2022-07-01 DIAGNOSIS — J9611 Chronic respiratory failure with hypoxia: Secondary | ICD-10-CM | POA: Diagnosis present

## 2022-07-05 ENCOUNTER — Ambulatory Visit (INDEPENDENT_AMBULATORY_CARE_PROVIDER_SITE_OTHER): Payer: No Typology Code available for payment source

## 2022-07-05 VITALS — BP 164/84 | HR 73 | Temp 97.3°F | Resp 18 | Ht 66.0 in | Wt 224.2 lb

## 2022-07-05 DIAGNOSIS — D539 Nutritional anemia, unspecified: Secondary | ICD-10-CM

## 2022-07-05 MED ORDER — DIPHENHYDRAMINE HCL 25 MG PO CAPS
25.0000 mg | ORAL_CAPSULE | Freq: Once | ORAL | Status: AC
  Administered 2022-07-05: 25 mg via ORAL
  Filled 2022-07-05: qty 1

## 2022-07-05 MED ORDER — SODIUM CHLORIDE 0.9 % IV SOLN
300.0000 mg | INTRAVENOUS | Status: DC
  Administered 2022-07-05: 300 mg via INTRAVENOUS
  Filled 2022-07-05: qty 15

## 2022-07-05 MED ORDER — ACETAMINOPHEN 325 MG PO TABS
650.0000 mg | ORAL_TABLET | Freq: Once | ORAL | Status: AC
  Administered 2022-07-05: 650 mg via ORAL
  Filled 2022-07-05: qty 2

## 2022-07-05 NOTE — Progress Notes (Signed)
Diagnosis: Iron Deficiency Anemia  Provider:  Marshell Garfinkel MD  Procedure: Infusion  IV Type: Peripheral, IV Location: R Hand  Venofer (Iron Sucrose), Dose: 300 mg  Infusion Start Time: 0909  Infusion Stop Time: 1050  Post Infusion IV Care: Observation period completed and Peripheral IV Discontinued  Discharge: Condition: Good, Destination: Home . AVS provided to patient.   Performed by:  Sabitri Jamaris Theard,RN

## 2022-07-12 ENCOUNTER — Ambulatory Visit (INDEPENDENT_AMBULATORY_CARE_PROVIDER_SITE_OTHER): Payer: No Typology Code available for payment source

## 2022-07-12 ENCOUNTER — Telehealth: Payer: Self-pay | Admitting: Medical Oncology

## 2022-07-12 VITALS — BP 152/77 | HR 78 | Temp 98.3°F | Resp 20 | Ht 66.0 in | Wt 221.6 lb

## 2022-07-12 DIAGNOSIS — D539 Nutritional anemia, unspecified: Secondary | ICD-10-CM

## 2022-07-12 MED ORDER — SODIUM CHLORIDE 0.9 % IV SOLN
300.0000 mg | INTRAVENOUS | Status: DC
  Administered 2022-07-12: 300 mg via INTRAVENOUS
  Filled 2022-07-12: qty 15

## 2022-07-12 MED ORDER — DIPHENHYDRAMINE HCL 25 MG PO CAPS
25.0000 mg | ORAL_CAPSULE | Freq: Once | ORAL | Status: AC
  Administered 2022-07-12: 25 mg via ORAL
  Filled 2022-07-12: qty 1

## 2022-07-12 MED ORDER — ACETAMINOPHEN 325 MG PO TABS
650.0000 mg | ORAL_TABLET | Freq: Once | ORAL | Status: AC
  Administered 2022-07-12: 650 mg via ORAL
  Filled 2022-07-12: qty 2

## 2022-07-12 NOTE — Progress Notes (Signed)
Diagnosis: Iron Deficiency Anemia  Provider:  Marshell Garfinkel MD  Procedure: Infusion  IV Type: Peripheral, IV Location: R Forearm  Venofer (Iron Sucrose), Dose: 300 mg  Infusion Start Time: 0918  Infusion Stop Time: 0981  Post Infusion IV Care: Peripheral IV Discontinued  Discharge: Condition: Good, Destination: Home . AVS provided to patient.   Performed by:  Sabitri Ranabhat,RN

## 2022-07-12 NOTE — Telephone Encounter (Signed)
Appts moved out to week of 12/11.

## 2022-07-19 ENCOUNTER — Ambulatory Visit (INDEPENDENT_AMBULATORY_CARE_PROVIDER_SITE_OTHER): Payer: No Typology Code available for payment source

## 2022-07-19 VITALS — BP 130/84 | HR 73 | Temp 98.4°F | Resp 16 | Ht 66.0 in | Wt 221.6 lb

## 2022-07-19 DIAGNOSIS — D539 Nutritional anemia, unspecified: Secondary | ICD-10-CM

## 2022-07-19 MED ORDER — SODIUM CHLORIDE 0.9 % IV SOLN
300.0000 mg | INTRAVENOUS | Status: DC
  Administered 2022-07-19: 300 mg via INTRAVENOUS
  Filled 2022-07-19: qty 15

## 2022-07-19 MED ORDER — DIPHENHYDRAMINE HCL 25 MG PO CAPS
25.0000 mg | ORAL_CAPSULE | Freq: Once | ORAL | Status: AC
  Administered 2022-07-19: 25 mg via ORAL
  Filled 2022-07-19: qty 1

## 2022-07-19 MED ORDER — ACETAMINOPHEN 325 MG PO TABS
650.0000 mg | ORAL_TABLET | Freq: Once | ORAL | Status: AC
  Administered 2022-07-19: 650 mg via ORAL
  Filled 2022-07-19: qty 2

## 2022-07-19 NOTE — Progress Notes (Signed)
Diagnosis: Iron Deficiency Anemia  Provider:  Marshell Garfinkel MD  Procedure: Infusion  IV Type: Peripheral, IV Location: R wrist  Venofer (Iron Sucrose), Dose: 300 mg  Infusion Start Time: 0928  Infusion Stop Time: 1561  Post Infusion IV Care: Peripheral IV Discontinued  Discharge: Condition: Good, Destination: Home . Patient declined AVS.  Performed by:  Binnie Kand, RN

## 2022-08-02 ENCOUNTER — Ambulatory Visit (INDEPENDENT_AMBULATORY_CARE_PROVIDER_SITE_OTHER): Payer: No Typology Code available for payment source | Admitting: Pulmonary Disease

## 2022-08-02 ENCOUNTER — Encounter: Payer: Self-pay | Admitting: Pulmonary Disease

## 2022-08-02 VITALS — BP 128/82 | HR 86 | Ht 66.0 in | Wt 224.0 lb

## 2022-08-02 DIAGNOSIS — R0609 Other forms of dyspnea: Secondary | ICD-10-CM | POA: Diagnosis not present

## 2022-08-02 DIAGNOSIS — J9611 Chronic respiratory failure with hypoxia: Secondary | ICD-10-CM | POA: Diagnosis not present

## 2022-08-02 DIAGNOSIS — J432 Centrilobular emphysema: Secondary | ICD-10-CM

## 2022-08-02 DIAGNOSIS — D649 Anemia, unspecified: Secondary | ICD-10-CM

## 2022-08-02 LAB — PULMONARY FUNCTION TEST
DL/VA % pred: 92 %
DL/VA: 3.83 ml/min/mmHg/L
DLCO cor % pred: 58 %
DLCO cor: 13.49 ml/min/mmHg
DLCO unc % pred: 48 %
DLCO unc: 11.23 ml/min/mmHg
FEF 25-75 Post: 0.73 L/sec
FEF 25-75 Pre: 0.69 L/sec
FEF2575-%Change-Post: 6 %
FEF2575-%Pred-Post: 33 %
FEF2575-%Pred-Pre: 31 %
FEV1-%Change-Post: 2 %
FEV1-%Pred-Post: 44 %
FEV1-%Pred-Pre: 43 %
FEV1-Post: 1.25 L
FEV1-Pre: 1.22 L
FEV1FVC-%Change-Post: 7 %
FEV1FVC-%Pred-Pre: 90 %
FEV6-%Change-Post: -4 %
FEV6-%Pred-Post: 48 %
FEV6-%Pred-Pre: 50 %
FEV6-Post: 1.73 L
FEV6-Pre: 1.82 L
FEV6FVC-%Change-Post: 0 %
FEV6FVC-%Pred-Post: 106 %
FEV6FVC-%Pred-Pre: 106 %
FVC-%Change-Post: -4 %
FVC-%Pred-Post: 45 %
FVC-%Pred-Pre: 47 %
FVC-Post: 1.73 L
FVC-Pre: 1.82 L
Post FEV1/FVC ratio: 72 %
Post FEV6/FVC ratio: 100 %
Pre FEV1/FVC ratio: 67 %
Pre FEV6/FVC Ratio: 100 %
RV % pred: 161 %
RV: 3.56 L
TLC % pred: 101 %
TLC: 6.31 L

## 2022-08-02 NOTE — Patient Instructions (Signed)
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

## 2022-08-02 NOTE — Patient Instructions (Signed)
Full PFT performed today. °

## 2022-08-02 NOTE — Progress Notes (Signed)
Full PFT performed today. °

## 2022-08-02 NOTE — Progress Notes (Signed)
Synopsis: Referred in November 2023 for COPD from the [place] system.  Formerly followed by Dr. Gwenette Greet at the [facility] in [place]. Quit tobacco 2014 Complete pulmonary rehab 2017 Continuing in pulmonary rehab maintenance Completed pulmonary rehab at [facility] 2023.    Subjective:   PATIENT ID: Sean [name] GENDER: male DOB: Dec 30, 1950, MRN: [id number]   HPI  Chief Complaint  Patient presents with   Follow-up    F/U after PFT. States he has been doing well since last visit.    Since the last visit Sean [name] has been about the same.  He said he had 3 infusions of iron and ever since then his breathing has improved.  He continues to use oxygen continuously.  He still using Stiolto and Asmanex.  No problems with bronchitis or pneumonia since the last visit.  Dyspnea has not worsened.  Leg swelling is about the same.   Past Medical History:  Diagnosis Date   Arthritis    COPD (chronic obstructive pulmonary disease) (HCC)    GERD (gastroesophageal reflux disease)    Hyperlipidemia    Hypertension    Sleep apnea        Review of Systems  Constitutional:  Negative for chills, fever, malaise/fatigue and weight loss.  HENT:  Negative for congestion, sinus pain and sore throat.   Respiratory:  Positive for shortness of breath. Negative for cough and sputum production.   Cardiovascular:  Negative for chest pain and leg swelling.      Objective:  Physical Exam   Vitals:   08/02/22 1559  BP: 128/82  Pulse: 86  SpO2: 94%  Weight: 224 lb (101.6 kg)  Height: '5\' 6"'$  (1.676 m)    2L [place]  Gen: chronically ill appearing HENT: OP clear, neck supple PULM: CTA B, normal effort  CV: RRR, no mgr GI: BS+, soft, nontender Derm: no cyanosis or rash Psyche: normal mood and affect    CBC    Component Value Date/Time   WBC 6.2 06/22/2022 1205   WBC 7.8 08/18/2021 1359   RBC 3.47 (L) 06/22/2022 1205   HGB 9.8 (L) 06/22/2022 1205   HCT 30.9 (L) 06/22/2022 1205   PLT 298 06/22/2022 1205   MCV  89.0 06/22/2022 1205   MCH 28.2 06/22/2022 1205   MCHC 31.7 06/22/2022 1205   RDW 20.1 (H) 06/22/2022 1205   LYMPHSABS 1.8 06/22/2022 1205   MONOABS 0.6 06/22/2022 1205   EOSABS 0.1 06/22/2022 1205   BASOSABS 0.1 06/22/2022 1205     Chest imaging: CT chest 2014: no acute process, extensive atherosclerosis CXR 06/2014: no acute process July 2023 CT chest showed no suspicious pulmonary nodules or masses, no right lower lobe pulmonary nodule seen limited by respiratory motion artifact, marked aortic coronary atherosclerotic calcification  PFT: PFT's 2009: FEV1 2.13 (68%), ratio 62 PFT's 01/2014: FEV1 1.64 (57%), ratio 55 PFT 09/2020: FEV1 1.21 (44%)>> 1.40 (51%), 16% BD, ratio 53, TLC 91%, DLCO 51% PFT 07/2022: FEV1 1.25 L 44% predicted, ratio 72%, TLC 6.31 L 101% predicted, DLCO 11.23 48% predicted; flow-volume loop is consistent with small airways obstruction  Labs: October 2023 CBC hemoglobin 10.1, white blood cell count 5.7, no differential reported  Path:  Echo: Echo 10/2020: nl EF, LVH/DD, RV nl, no pulm htn 09-08-21 Mild Concentric LVH, LVSF is normal. EF >= 55% No regional wall motion  abnormalities Mild Valvular aortic stenosis Trace MR and Trace TR RVSP is estimated at  41m Hg Ascending Aorta 4.0cm Mildly dilated ascending aorta.  Heart Catheterization:  Nuclear stress 05/2015: low risk Nuclear stress testing 10/2020: no ischemia, low risk study  Ambulatory oximetry: Ambulatory Ox 05/2015: desat to 90% at 869f brisk walking. Ambulatory Ox 08/2020: 90% at 5729f  Echo      Assessment & Plan:   Chronic respiratory failure with hypoxia (HCC)  Dyspnea on exertion  Centrilobular emphysema (HCC)  Anemia, unspecified type  Discussion: This has been a stable interval for [place] He has severe chronic respiratory failure due to a combination of centrilobular emphysema and postinflammatory scarring in the bases of his lungs from her prior pneumonia.  Fortunately he  has not had recurrent exacerbations.  There is no evidence of progression of his disease.  He seems to be well-controlled on Stiolto and Asmanex.  Plan: Severe COPD: Continue Stiolto 2 puffs daily Continue Asmanex as you are doing Use albuterol as needed for chest tightness wheezing or shortness of breath Practice good hand hygiene, stay active  Chronic respiratory failure with hypoxemia: Keep using 2 L of oxygen continuously  Scarring in the bases of your lungs, postinflammatory: Based on my review of the CT scan of your chest I see no reason why they should progress If you have worsening shortness of breath and we can repeat lung function testing but at this time I do not see a reason to keep doing that  Former cigarette smoker: Keep lung function screening CT [place] [facility] [place]  We will see you back in 6 months or sooner if needed  Immunizations: Immunization History  Administered Date(s) Administered   Fluad Quad(high Dose 65+) 06/22/2020, 09/27/2021   H1N1 08/15/2008   Hepatitis A, Adult 06/21/1997, 02/22/1998   Influenza Whole 06/21/1998, 08/22/1999, 07/10/2000, 06/28/2001, 06/16/2002, 07/28/2005, 06/09/2006   Influenza,inj,Quad PF,6+ Mos 06/01/2016, 06/12/2017, 05/23/2018   Influenza-Unspecified 08/28/2003, 06/19/2004, 06/09/2006, 07/16/2007, 05/31/2008, 08/15/2008, 09/20/2008, 07/14/2009, 08/13/2010, 07/23/2012, 06/11/2013, 07/08/2014, 05/18/2015, 05/23/2019, 10/16/2021, 05/25/2022, 05/25/2022   Meningococcal Polysaccharide 11/13/1993, 07/10/2000   Moderna Covid-19 Vaccine Bivalent Booster 1843yr up 05/05/2021   Moderna Sars-Covid-2 Vaccination 09/25/2019, 10/23/2019, 05/12/2020, 12/02/2020   OPV 08/16/1971   PPD Test 06/21/1998, 08/22/1999, 02/06/2001, 01/16/2002   Pneumococcal Conjugate-13 11/22/2017   Pneumococcal Polysaccharide-23 08/06/2019   Pneumococcal-Unspecified 08/26/2009   Td 11/13/1993, 01/20/2004   Td (Adult), 2 Lf Tetanus Toxid,  Preservative Free 11/13/1993, 01/20/2004   Td (Adult),5 Lf Tetanus Toxid, Preservative Free 10/14/2021   Tdap 02/24/2012   Typhoid Parenteral 06/21/2000   Typhoid Parenteral, AKD (US Korealitary) 11/13/1993   Unspecified SARS-COV-2 Vaccination 05/25/2022   Varicella 02/03/2006   Yellow Fever 12/14/1994   Zoster Recombinat (Shingrix) 02/03/2020, 06/03/2020     Current Outpatient Medications:    albuterol (PROVENTIL HFA;VENTOLIN HFA) 108 (90 BASE) MCG/ACT inhaler, Inhale 2 puffs into the lungs every 6 (six) hours as needed for wheezing or shortness of breath., Disp: , Rfl:    albuterol (PROVENTIL) (2.5 MG/3ML) 0.083% nebulizer solution, Take 2.5 mg by nebulization every 6 (six) hours as needed for wheezing or shortness of breath., Disp: , Rfl:    amLODipine (NORVASC) 2.5 MG tablet, Take 2.5 mg by mouth daily., Disp: , Rfl:    amoxicillin (AMOXIL) 500 MG capsule, Take 2 capsules (1,000 mg total) by mouth 2 (two) times daily., Disp: 120 capsule, Rfl: 1   aspirin EC 81 MG tablet, Take 81 mg by mouth at bedtime. Swallow whole., Disp: , Rfl:    benazepril (LOTENSIN) 10 MG tablet, Take 1 tablet (10 mg total) by mouth at bedtime., Disp: 90 tablet,  Rfl: 3   Carboxymethylcellulose Sodium (THERATEARS PF OP), Place 2 drops into both eyes 3 (three) times daily., Disp: , Rfl:    cholecalciferol (VITAMIN D) 1000 UNITS tablet, Take 1,000 Units by mouth 2 (two) times daily., Disp: , Rfl:    cilostazol (PLETAL) 100 MG tablet, Take 100 mg by mouth 2 (two) times daily., Disp: , Rfl:    erythromycin ophthalmic ointment, Apply to eye., Disp: , Rfl:    ferrous sulfate 325 (65 FE) MG tablet, Take 1 tablet by mouth daily., Disp: , Rfl:    folic acid (FOLVITE) 1 MG tablet, Take 1 mg by mouth daily., Disp: , Rfl:    LACTOBACILLUS PO, Take 2 capsules by mouth daily., Disp: , Rfl:    Magnesium Oxide 420 MG TABS, Take 420 mg by mouth every other day., Disp: , Rfl:    MENTHOL-METHYL SALICYLATE EX, Apply 1 application  topically 2 (two) times daily as needed (for knee pain)., Disp: , Rfl:    metoprolol (TOPROL-XL) 200 MG 24 hr tablet, Take 200 mg by mouth daily., Disp: , Rfl:    montelukast (SINGULAIR) 10 MG tablet, Take 10 mg by mouth at bedtime., Disp: , Rfl:    Multiple Vitamin (MULTIVITAMIN) tablet, Take 1 tablet by mouth every other day. Vitamins for eyes, Disp: , Rfl:    Multiple Vitamin (MULTIVITAMIN) tablet, Take 1 tablet by mouth daily., Disp: , Rfl:    Multiple Vitamins-Minerals (MULTIVITAMIN WITH MINERALS) tablet, Take 1 tablet by mouth daily., Disp: , Rfl:    niacin 100 MG tablet, Take 100 mg by mouth in the morning., Disp: , Rfl:    Omega-3 Fatty Acids (FISH OIL) 1000 MG CAPS, Take 1,000 mg by mouth daily., Disp: , Rfl:    pantoprazole (PROTONIX) 40 MG tablet, Take 40 mg by mouth 2 (two) times daily., Disp: , Rfl:    rosuvastatin (CRESTOR) 20 MG tablet, Take 10 mg by mouth daily., Disp: , Rfl:    thiamine 100 MG tablet, Take 100 mg by mouth daily., Disp: , Rfl:    Tiotropium Bromide-Olodaterol (STIOLTO RESPIMAT) 2.5-2.5 MCG/ACT AERS, Inhale 2 each into the lungs daily., Disp: , Rfl:   Current Facility-Administered Medications:    0.9 %  sodium chloride infusion, 250 mL, Intravenous, PRN, Baglia, Corrina, PA-C   alum & mag hydroxide-simeth (MAALOX/MYLANTA) 200-200-20 MG/5ML suspension 15-30 mL, 15-30 mL, Oral, Q2H PRN, Baglia, Corrina, PA-C   docusate sodium (COLACE) capsule 100 mg, 100 mg, Oral, BID, Baglia, Corrina, PA-C   guaiFENesin-dextromethorphan (ROBITUSSIN DM) 100-10 MG/5ML syrup 15 mL, 15 mL, Oral, Q4H PRN, Baglia, Corrina, PA-C   heparin injection 5,000 Units, 5,000 Units, Subcutaneous, Q8H, Baglia, Corrina, PA-C   hydrALAZINE (APRESOLINE) injection 5 mg, 5 mg, Intravenous, Q20 Min PRN, Baglia, Corrina, PA-C   labetalol (NORMODYNE) injection 10 mg, 10 mg, Intravenous, Q10 min PRN, Baglia, Corrina, PA-C   metoprolol tartrate (LOPRESSOR) injection 2-5 mg, 2-5 mg, Intravenous, Q2H PRN,  Baglia, Corrina, PA-C   ondansetron (ZOFRAN) injection 4 mg, 4 mg, Intravenous, Q6H PRN, Baglia, Corrina, PA-C   phenol (CHLORASEPTIC) mouth spray 1 spray, 1 spray, Mouth/Throat, PRN, Baglia, Corrina, PA-C   senna-docusate (Senokot-S) tablet 1 tablet, 1 tablet, Oral, QHS PRN, Baglia, Corrina, PA-C   sodium chloride flush (NS) 0.9 % injection 3 mL, 3 mL, Intravenous, Q12H, Baglia, Corrina, PA-C   sodium chloride flush (NS) 0.9 % injection 3 mL, 3 mL, Intravenous, PRN, Baglia, Corrina, PA-C  Facility-Administered Medications Ordered in Other Visits:    acetaminophen (TYLENOL)  tablet 325-650 mg, 325-650 mg, Oral, Q4H PRN **OR** acetaminophen (TYLENOL) suppository 320-640 mg, 320-640 mg, Rectal, Q4H PRN, Baglia, Corrina, PA-C

## 2022-08-29 ENCOUNTER — Telehealth: Payer: Self-pay | Admitting: Internal Medicine

## 2022-08-29 NOTE — Telephone Encounter (Signed)
Called patient regarding upcoming February appointments, patient is notified.

## 2022-09-08 ENCOUNTER — Encounter: Payer: Self-pay | Admitting: Internal Medicine

## 2022-09-08 ENCOUNTER — Ambulatory Visit (INDEPENDENT_AMBULATORY_CARE_PROVIDER_SITE_OTHER): Payer: No Typology Code available for payment source | Admitting: Internal Medicine

## 2022-09-08 VITALS — BP 126/72 | HR 76 | Ht 66.0 in | Wt 222.0 lb

## 2022-09-08 DIAGNOSIS — R195 Other fecal abnormalities: Secondary | ICD-10-CM

## 2022-09-08 DIAGNOSIS — D509 Iron deficiency anemia, unspecified: Secondary | ICD-10-CM | POA: Diagnosis not present

## 2022-09-08 DIAGNOSIS — J9611 Chronic respiratory failure with hypoxia: Secondary | ICD-10-CM | POA: Diagnosis not present

## 2022-09-08 DIAGNOSIS — Z86010 Personal history of colonic polyps: Secondary | ICD-10-CM

## 2022-09-08 NOTE — Patient Instructions (Addendum)
_______________________________________________________  If your blood pressure at your visit was 140/90 or greater, please contact your primary care physician to follow up on this.  _______________________________________________________  If you are age 70 or older, your body mass index should be between 23-30. Your Body mass index is 35.83 kg/m. If this is out of the aforementioned range listed, please consider follow up with your Primary Care Provider.  If you are age 72 or younger, your body mass index should be between 19-25. Your Body mass index is 35.83 kg/m. If this is out of the aformentioned range listed, please consider follow up with your Primary Care Provider.   ________________________________________________________  The [place] [facility] providers would like to encourage you to use [facility] to communicate with providers for non-urgent requests or questions.  Due to long hold times on the telephone, sending your provider a message by [facility] may be a faster and more efficient way to get a response.  Please allow 48 business hours for a response.  Please remember that this is for non-urgent requests.  _______________________________________________________  [name]  [phone]  10/10/2022 at 8:00am, arrive at 7:30am.  Go by their office to pick up the prep at least 4 days prior to the test.

## 2022-09-08 NOTE — Progress Notes (Signed)
HISTORY OF PRESENT ILLNESS:  Sean [name] is a 72 y.o. male with multiple significant medical problems including longstanding tobacco abuse with severe COPD requiring chronic oxygen therapy, hypertension, hyperlipidemia, morbid obesity, and peripheral vascular disease for which she is on aspirin and Pletal therapies.  He is referred today by the [facility] at the request of oncology, Dr. Julien Nordmann, regarding iron deficiency anemia and Hemoccult positive stool.  Review of records from the [facility] dated July 14, 2022 states that the patient underwent colonoscopy in March 2021 due to a history of colon polyps.  He was found to have multiple diminutive colon polyps which were adenomatous.  Apparently, follow-up in 3 years recommended.  Review of blood work from October 2023 revealed anemia with a hemoglobin of 10.9.  CT of the chest in July 2023 did not reveal any suspicious lesions.  Blood work from November 2023 revealed anemia with hemoglobin 9.8.  He was iron deficient with a ferritin of 5.  He has received 3 iron infusions.  He is on oral iron 3 times weekly.  I reviewed the most recent oncology note as well as pulmonary medicine note.  Patient tells me he has a history of GERD for which he takes pantoprazole daily.  He also reports intermittent loose stools.  No melena or hematochezia.  REVIEW OF SYSTEMS:  All non-GI ROS negative unless otherwise stated in the HPI except for fatigue, shortness of breath, night sweats, itching, lower extremity edema  Past Medical History:  Diagnosis Date   Alcohol abuse    Alcoholic hepatitis without ascites    Arthritis    Cataracts, bilateral    Colon polyps    COPD (chronic obstructive pulmonary disease) ([place])    Fatty liver    GERD (gastroesophageal reflux disease)    Hyperlipidemia    Hypertension    Pneumonia    PTSD (post-traumatic stress disorder)    Skin carcinoma    Sleep apnea    Umbilical hernia    Vitamin D deficiency     Past  Surgical History:  Procedure Laterality Date   ABDOMINAL AORTOGRAM W/LOWER EXTREMITY Bilateral 09/28/2020   Procedure: ABDOMINAL AORTOGRAM W/LOWER EXTREMITY;  Surgeon: Waynetta Sandy, MD;  Location: [place] [facility];  Service: Cardiovascular;  Laterality: Bilateral;   APPLICATION OF WOUND VAC Right 01/12/2021   Procedure: APPLICATION OF WOUND VAC;  Surgeon: Waynetta Sandy, MD;  Location: [place];  Service: Vascular;  Laterality: Right;   BACK SURGERY     ENDARTERECTOMY FEMORAL Left 11/04/2020   Procedure: LEFT COMMON FEMORAL ARTERY ENDARTERECTOMY;  Surgeon: Waynetta Sandy, MD;  Location: [place];  Service: Vascular;  Laterality: Left;   EYE SURGERY     FEMORAL-FEMORAL BYPASS GRAFT Bilateral 11/04/2020   Procedure: RIGHT TO LEFT FEMORAL-FEMORAL BYPASS;  Surgeon: Waynetta Sandy, MD;  Location: [place];  Service: Vascular;  Laterality: Bilateral;   HERNIA REPAIR     INCISION AND DRAINAGE OF WOUND Right 01/06/2021   Procedure: IRRIGATION AND DEBRIDEMENT GROIN;  Surgeon: Serafina Mitchell, MD;  Location: [place];  Service: Vascular;  Laterality: Right;   INCISION AND DRAINAGE OF WOUND Right 01/12/2021   Procedure: [facility] OUT AND MUSCLE FLAP;  Surgeon: Waynetta Sandy, MD;  Location: [place];  Service: Vascular;  Laterality: Right;   PATCH ANGIOPLASTY Bilateral 11/04/2020   Procedure: PATCH ANGIOPLASTY LEFT AND RIGHT COMMON FEMORAL ARTERY;  Surgeon: Waynetta Sandy, MD;  Location: [place];  Service: Vascular;  Laterality: Bilateral;  ULTRASOUND GUIDANCE FOR VASCULAR ACCESS Bilateral 11/04/2020   Procedure: ULTRASOUND GUIDANCE FOR VASCULAR ACCESS;  Surgeon: Waynetta Sandy, MD;  Location: [place];  Service: Vascular;  Laterality: Bilateral;   [place]  reports that he quit smoking about 9 years ago. His smoking use included cigarettes. He has a 135.00 pack-year smoking history. He has never been  exposed to tobacco smoke. He quit smokeless tobacco use about 46 years ago. He reports current alcohol use of about 30.0 standard drinks of alcohol per week. He reports that he does not use drugs.  family history includes Arthritis in his sister; COPD in his father and mother; Diabetes in his mother; Heart disease in his father; Hypertension in his father.  Allergies  Allergen Reactions   Amlodipine Swelling and Other (See Comments)    Edema of lower extremities       PHYSICAL EXAMINATION: Vital signs: BP 126/72   Pulse 76   Ht '5\' 6"'$  (1.676 m)   Wt 222 lb (100.7 kg)   BMI 35.83 kg/m   Constitutional: Obese, chronically ill-appearing, nasal oxygen in place, dyspnea at rest Psychiatric: alert and oriented x3, cooperative Eyes: extraocular movements intact, anicteric, conjunctiva pink Mouth: oral pharynx moist, no lesions Neck: supple no lymphadenopathy Cardiovascular: heart regular rate and rhythm, no murmur Lungs: clear to auscultation bilaterally with distant breath sounds Abdomen: soft, morbidly obese, nontender, nondistended, no obvious ascites, no peritoneal signs, normal bowel sounds, no organomegaly Rectal: Omitted Extremities: no clubbing or cyanosis.  1+ lower extremity edema bilaterally Skin: no lesions on visible extremities save stasis changes Neuro: No focal deficits.  Cranial nerves intact  ASSESSMENT:  1.  Iron deficiency anemia.  Status post iron infusion therapy x 3.  On oral iron 3 times weekly. 2.  Heme positive stool 3.  History of adenomatous colon polyps.  Last colonoscopy 2021 4.  Chronic GERD.  On PPI 5.  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  Dyspnea at rest.  Dyspnea with minimal exertion 6.  Vascular disease on aspirin and Pletal   PLAN:  1.  The standard workup for iron deficiency anemia and Hemoccult positive stool will be colonoscopy and upper endoscopy plus or minus capsule endoscopy.  However, this patient is not a candidate  for endoscopic procedures with sedation.  His lung disease and body habitus make his risk extraordinary.  I discussed this with him. 2.  Will schedule virtual colonoscopy to rule out any significant mass lesion of the colon. 3.  May consider upper GI series to rule out significant upper GI mucosal lesion. 4.  He should stay on pantoprazole daily for upper GI mucosal protection 5.  He should continue close follow-up with hematology to make sure that his iron levels are adequate and hemoglobin levels adequate 6.  Close follow-up with pulmonary medicine regarding his lung disease 7.  Continue oral iron 8.  General medical care via [facility]  A total time of 65 minutes was spent preparing to see the patient, reviewing the myriad of records and reports as well as data.  Obtaining comprehensive history, performing medically appropriate physical examination, counseling and educating the patient regarding the above listed issues, ordering advanced radiology studies, and documenting clinical information in the health record.

## 2022-09-22 ENCOUNTER — Ambulatory Visit: Payer: No Typology Code available for payment source | Admitting: Internal Medicine

## 2022-09-22 ENCOUNTER — Other Ambulatory Visit: Payer: No Typology Code available for payment source

## 2022-09-28 ENCOUNTER — Telehealth: Payer: Self-pay | Admitting: Medical Oncology

## 2022-09-28 NOTE — Telephone Encounter (Signed)
Pt asking if we received [facility] approval for hematology coverage. I told pt I cannot see any correspondence re this from the [place]. He said he will keep his appts tomorrow.

## 2022-09-29 ENCOUNTER — Inpatient Hospital Stay: Payer: No Typology Code available for payment source | Attending: Internal Medicine

## 2022-09-29 ENCOUNTER — Inpatient Hospital Stay: Payer: No Typology Code available for payment source | Admitting: Internal Medicine

## 2022-09-29 ENCOUNTER — Encounter: Payer: Self-pay | Admitting: Internal Medicine

## 2022-09-29 VITALS — BP 158/81 | HR 86 | Temp 98.2°F | Resp 118 | Wt 220.4 lb

## 2022-09-29 DIAGNOSIS — K922 Gastrointestinal hemorrhage, unspecified: Secondary | ICD-10-CM | POA: Insufficient documentation

## 2022-09-29 DIAGNOSIS — K921 Melena: Secondary | ICD-10-CM | POA: Diagnosis not present

## 2022-09-29 DIAGNOSIS — D472 Monoclonal gammopathy: Secondary | ICD-10-CM

## 2022-09-29 DIAGNOSIS — D500 Iron deficiency anemia secondary to blood loss (chronic): Secondary | ICD-10-CM | POA: Insufficient documentation

## 2022-09-29 DIAGNOSIS — D539 Nutritional anemia, unspecified: Secondary | ICD-10-CM

## 2022-09-29 LAB — CBC WITH DIFFERENTIAL (CANCER CENTER ONLY)
Abs Immature Granulocytes: 0.05 10*3/uL (ref 0.00–0.07)
Basophils Absolute: 0.1 10*3/uL (ref 0.0–0.1)
Basophils Relative: 1 %
Eosinophils Absolute: 0.1 10*3/uL (ref 0.0–0.5)
Eosinophils Relative: 2 %
HCT: 38.1 % — ABNORMAL LOW (ref 39.0–52.0)
Hemoglobin: 13.2 g/dL (ref 13.0–17.0)
Immature Granulocytes: 1 %
Lymphocytes Relative: 27 %
Lymphs Abs: 1.8 10*3/uL (ref 0.7–4.0)
MCH: 35 pg — ABNORMAL HIGH (ref 26.0–34.0)
MCHC: 34.6 g/dL (ref 30.0–36.0)
MCV: 101.1 fL — ABNORMAL HIGH (ref 80.0–100.0)
Monocytes Absolute: 0.6 10*3/uL (ref 0.1–1.0)
Monocytes Relative: 9 %
Neutro Abs: 4.1 10*3/uL (ref 1.7–7.7)
Neutrophils Relative %: 60 %
Platelet Count: 214 10*3/uL (ref 150–400)
RBC: 3.77 MIL/uL — ABNORMAL LOW (ref 4.22–5.81)
RDW: 17.9 % — ABNORMAL HIGH (ref 11.5–15.5)
WBC Count: 6.7 10*3/uL (ref 4.0–10.5)
nRBC: 0 % (ref 0.0–0.2)

## 2022-09-29 LAB — IRON AND IRON BINDING CAPACITY (CC-WL,HP ONLY)
Iron: 89 ug/dL (ref 45–182)
Saturation Ratios: 28 % (ref 17.9–39.5)
TIBC: 314 ug/dL (ref 250–450)
UIBC: 225 ug/dL (ref 117–376)

## 2022-09-29 LAB — FERRITIN: Ferritin: 17 ng/mL — ABNORMAL LOW (ref 24–336)

## 2022-09-29 NOTE — Progress Notes (Signed)
Sean [name] Telephone:([phone]) [phone]   Fax:(336) [place] [phone]  DIAGNOSIS: iron deficiency anemia with unclear etiology but likely could be secondary to gastrointestinal blood loss and the patient with long history of alcohol abuse.   PRIOR THERAPY: Iron for lesion with Venofer 300 Mg IV weekly for 3 weeks.  CURRENT THERAPY: Over-the-counter oral iron tablets  INTERVAL HISTORY: Sean [name] 72 y.o. male returns to the clinic today for follow-up visit.  The patient is feeling fine today with no concerning complaints except for the baseline shortness of breath and he is currently on home oxygen.  He denied having any current chest pain, cough or hemoptysis.  He has no nausea, vomiting, diarrhea or constipation.  He was seen by a gastroenterologist at the [facility] clinic in [place].  He has positive stool for Hemoccult.  The patient received iron infusion with Venofer for 3 doses in November and early December 2023.  He is feeling much better.  His previous study also showed M spike and he will need close monitoring for MGUS.  He is here today for evaluation and repeat blood work.  MEDICAL HISTORY: Past Medical History:  Diagnosis Date   Alcohol abuse    Alcoholic hepatitis without ascites    Arthritis    Cataracts, bilateral    Colon polyps    COPD (chronic obstructive pulmonary disease) (HCC)    Fatty liver    GERD (gastroesophageal reflux disease)    Hyperlipidemia    Hypertension    Pneumonia    PTSD (post-traumatic stress disorder)    Skin carcinoma    Sleep apnea    Umbilical hernia    Vitamin D deficiency     ALLERGIES:  is allergic to amlodipine.  MEDICATIONS:  Current Outpatient Medications  Medication Sig Dispense Refill   albuterol (PROVENTIL HFA;VENTOLIN HFA) 108 (90 BASE) MCG/ACT inhaler Inhale 2 puffs into the lungs every 6 (six) hours as needed for wheezing or  shortness of breath.     albuterol (PROVENTIL) (2.5 MG/3ML) 0.083% nebulizer solution Take 2.5 mg by nebulization every 6 (six) hours as needed for wheezing or shortness of breath.     amLODipine (NORVASC) 2.5 MG tablet Take 2.5 mg by mouth daily.     amoxicillin (AMOXIL) 500 MG capsule Take 2 capsules (1,000 mg total) by mouth 2 (two) times daily. 120 capsule 1   aspirin EC 81 MG tablet Take 81 mg by mouth at bedtime. Swallow whole. (Patient not taking: Reported on 09/08/2022)     benazepril (LOTENSIN) 10 MG tablet Take 1 tablet (10 mg total) by mouth at bedtime. 90 tablet 3   Carboxymethylcellulose Sodium (THERATEARS PF OP) Place 2 drops into both eyes 3 (three) times daily.     cholecalciferol (VITAMIN D) 1000 UNITS tablet Take 1,000 Units by mouth 2 (two) times daily.     cilostazol (PLETAL) 100 MG tablet Take 100 mg by mouth 2 (two) times daily.     erythromycin ophthalmic ointment Apply to eye.     ferrous sulfate 325 (65 FE) MG tablet Take 1 tablet by mouth daily.     folic acid (FOLVITE) 1 MG tablet Take 1 mg by mouth daily.     LACTOBACILLUS PO Take 2 capsules by mouth daily.     Magnesium Oxide 420 MG TABS Take 420 mg by mouth every other day.     MENTHOL-METHYL SALICYLATE EX Apply 1  application topically 2 (two) times daily as needed (for knee pain).     metoprolol (TOPROL-XL) 200 MG 24 hr tablet Take 200 mg by mouth daily.     montelukast (SINGULAIR) 10 MG tablet Take 10 mg by mouth at bedtime.     Multiple Vitamin (MULTIVITAMIN) tablet Take 1 tablet by mouth every other day. Vitamins for eyes     Multiple Vitamin (MULTIVITAMIN) tablet Take 1 tablet by mouth daily.     Multiple Vitamins-Minerals (MULTIVITAMIN WITH MINERALS) tablet Take 1 tablet by mouth daily.     niacin 100 MG tablet Take 100 mg by mouth in the morning.     NON FORMULARY      Omega-3 Fatty Acids (FISH OIL) 1000 MG CAPS Take 1,000 mg by mouth daily.     pantoprazole (PROTONIX) 40 MG tablet Take 40 mg by mouth 2  (two) times daily.     rosuvastatin (CRESTOR) 20 MG tablet Take 10 mg by mouth daily.     thiamine 100 MG tablet Take 100 mg by mouth daily.     Tiotropium Bromide-Olodaterol (STIOLTO RESPIMAT) 2.5-2.5 MCG/ACT AERS Inhale 2 each into the lungs daily.     Current Facility-Administered Medications  Medication Dose Route Frequency Provider Last Rate Last Admin   0.9 %  sodium chloride infusion  250 mL Intravenous PRN Baglia, Corrina, PA-C       alum & mag hydroxide-simeth (MAALOX/MYLANTA) 200-200-20 MG/5ML suspension 15-30 mL  15-30 mL Oral Q2H PRN Baglia, Corrina, PA-C       docusate sodium (COLACE) capsule 100 mg  100 mg Oral BID Baglia, Corrina, PA-C       guaiFENesin-dextromethorphan (ROBITUSSIN DM) 100-10 MG/5ML syrup 15 mL  15 mL Oral Q4H PRN Baglia, Corrina, PA-C       heparin injection 5,000 Units  5,000 Units Subcutaneous Q8H Baglia, Corrina, PA-C       hydrALAZINE (APRESOLINE) injection 5 mg  5 mg Intravenous Q20 Min PRN Baglia, Corrina, PA-C       labetalol (NORMODYNE) injection 10 mg  10 mg Intravenous Q10 min PRN Baglia, Corrina, PA-C       metoprolol tartrate (LOPRESSOR) injection 2-5 mg  2-5 mg Intravenous Q2H PRN Baglia, Corrina, PA-C       ondansetron (ZOFRAN) injection 4 mg  4 mg Intravenous Q6H PRN Baglia, Corrina, PA-C       phenol (CHLORASEPTIC) mouth spray 1 spray  1 spray Mouth/Throat PRN Baglia, Corrina, PA-C       senna-docusate (Senokot-S) tablet 1 tablet  1 tablet Oral QHS PRN Baglia, Corrina, PA-C       sodium chloride flush (NS) 0.9 % injection 3 mL  3 mL Intravenous Q12H Baglia, Corrina, PA-C       sodium chloride flush (NS) 0.9 % injection 3 mL  3 mL Intravenous PRN Baglia, Corrina, PA-C       Facility-Administered Medications Ordered in Other Visits  Medication Dose Route Frequency Provider Last Rate Last Admin   acetaminophen (TYLENOL) tablet 325-650 mg  325-650 mg Oral Q4H PRN Baglia, Corrina, PA-C       Or   acetaminophen (TYLENOL) suppository 320-640 mg   320-640 mg Rectal Q4H PRN Baglia, Corrina, PA-C        SURGICAL HISTORY:  Past Surgical History:  Procedure Laterality Date   ABDOMINAL AORTOGRAM W/LOWER EXTREMITY Bilateral 09/28/2020   Procedure: ABDOMINAL AORTOGRAM W/LOWER EXTREMITY;  Surgeon: Waynetta Sandy, MD;  Location: [facility];  Service: Cardiovascular;  Laterality: Bilateral;  APPLICATION OF WOUND VAC Right 01/12/2021   Procedure: APPLICATION OF WOUND VAC;  Surgeon: Waynetta Sandy, MD;  Location: [place];  Service: Vascular;  Laterality: Right;   BACK SURGERY     ENDARTERECTOMY FEMORAL Left 11/04/2020   Procedure: LEFT COMMON FEMORAL ARTERY ENDARTERECTOMY;  Surgeon: Waynetta Sandy, MD;  Location: [place];  Service: Vascular;  Laterality: Left;   EYE SURGERY     FEMORAL-FEMORAL BYPASS GRAFT Bilateral 11/04/2020   Procedure: RIGHT TO LEFT FEMORAL-FEMORAL BYPASS;  Surgeon: Waynetta Sandy, MD;  Location: [place];  Service: Vascular;  Laterality: Bilateral;   HERNIA REPAIR     INCISION AND DRAINAGE OF WOUND Right 01/06/2021   Procedure: IRRIGATION AND DEBRIDEMENT GROIN;  Surgeon: Serafina Mitchell, MD;  Location: [place];  Service: Vascular;  Laterality: Right;   INCISION AND DRAINAGE OF WOUND Right 01/12/2021   Procedure: [facility] OUT AND MUSCLE FLAP;  Surgeon: Waynetta Sandy, MD;  Location: [place];  Service: Vascular;  Laterality: Right;   PATCH ANGIOPLASTY Bilateral 11/04/2020   Procedure: PATCH ANGIOPLASTY LEFT AND RIGHT COMMON FEMORAL ARTERY;  Surgeon: Waynetta Sandy, MD;  Location: [place];  Service: Vascular;  Laterality: Bilateral;   ULTRASOUND GUIDANCE FOR VASCULAR ACCESS Bilateral 11/04/2020   Procedure: ULTRASOUND GUIDANCE FOR VASCULAR ACCESS;  Surgeon: Waynetta Sandy, MD;  Location: [place];  Service: Vascular;  Laterality: Bilateral;   WISDOM TOOTH EXTRACTION      REVIEW OF SYSTEMS:  A comprehensive review of systems was negative except for: Respiratory:  positive for dyspnea on exertion   PHYSICAL EXAMINATION: General appearance: alert, cooperative, fatigued, and no distress Head: Normocephalic, without obvious abnormality, atraumatic Neck: no adenopathy, no JVD, supple, symmetrical, trachea midline, and thyroid not enlarged, symmetric, no tenderness/mass/nodules Lymph nodes: Cervical, supraclavicular, and axillary nodes normal. Resp: clear to auscultation bilaterally Back: symmetric, no curvature. ROM normal. No CVA tenderness. Cardio: regular rate and rhythm, S1, S2 normal, no murmur, click, rub or gallop GI: soft, non-tender; bowel sounds normal; no masses,  no organomegaly Extremities: extremities normal, atraumatic, no cyanosis or edema  ECOG PERFORMANCE STATUS: 1 - Symptomatic but completely ambulatory  Blood pressure (!) 158/81, pulse 86, temperature 98.2 F (36.8 C), temperature source Oral, resp. rate (!) 118, weight 220 lb 6.4 oz (100 kg), SpO2 95 %.  LABORATORY DATA: Lab Results  Component Value Date   WBC 6.7 09/29/2022   HGB 13.2 09/29/2022   HCT 38.1 (L) 09/29/2022   MCV 101.1 (H) 09/29/2022   PLT 214 09/29/2022      Chemistry      Component Value Date/Time   NA 128 (L) 08/18/2021 1359   NA 134 10/13/2020 0741   K 4.2 08/18/2021 1359   CL 92 (L) 08/18/2021 1359   CO2 26 08/18/2021 1359   BUN 13 08/18/2021 1359   BUN 11 10/13/2020 0741   CREATININE 0.95 08/18/2021 1359   CREATININE 0.87 04/05/2021 0903      Component Value Date/Time   CALCIUM 9.4 08/18/2021 1359   ALKPHOS 76 01/04/2021 1638   AST 29 01/04/2021 1638   ALT 29 01/04/2021 1638   BILITOT 0.8 01/04/2021 1638       RADIOGRAPHIC STUDIES: No results found.  ASSESSMENT AND PLAN: This is a very pleasant 72 years old white male with history of iron deficiency anemia secondary to gastrointestinal hemorrhage with positive stool for Hemoccult.  The patient also has M spike on the recent blood work. He was treated with Venofer 300  Mg IV weekly for 3  weeks and tolerated his treatment well. Repeat CBC today showed improvement of his hemoglobin up to 13.2 and hematocrit 38.1%. I recommended for the patient to continue with the oral iron tablet. He will also continue his routine follow-up and evaluation by gastroenterology to rule out any gastrointestinal blood loss. For the M spike he will need close monitoring with repeat myeloma panel.  I will arrange for the patient to come back for follow-up visit in 3 months with repeat iron study and myeloma panel unless he will have a follow-up appointment and evaluation at the [facility] clinic. The patient was advised to call immediately if he has any other concerning symptoms in the interval. The patient voices understanding of current disease status and treatment options and is in agreement with the current care plan.  All questions were answered. The patient knows to call the clinic with any problems, questions or concerns. We can certainly see the patient much sooner if necessary.  The total time spent in the appointment was 30 minutes.  Disclaimer: This note was dictated with voice recognition software. Similar sounding words can inadvertently be transcribed and may not be corrected upon review.

## 2022-10-07 ENCOUNTER — Encounter: Payer: Self-pay | Admitting: Internal Medicine

## 2022-10-10 ENCOUNTER — Ambulatory Visit
Admission: RE | Admit: 2022-10-10 | Discharge: 2022-10-10 | Disposition: A | Discharge status: Home / Self Care | Payer: No Typology Code available for payment source | Source: Ambulatory Visit | Attending: Internal Medicine | Admitting: Internal Medicine

## 2022-10-10 DIAGNOSIS — R195 Other fecal abnormalities: Secondary | ICD-10-CM

## 2022-10-10 DIAGNOSIS — D509 Iron deficiency anemia, unspecified: Secondary | ICD-10-CM

## 2022-10-10 DIAGNOSIS — Z86010 Personal history of colonic polyps: Secondary | ICD-10-CM

## 2022-10-19 ENCOUNTER — Telehealth: Payer: Self-pay | Admitting: Medical Oncology

## 2022-10-19 ENCOUNTER — Telehealth: Payer: Self-pay | Admitting: General Practice

## 2022-10-19 NOTE — Telephone Encounter (Signed)
Returned pt call. He needs to r/s May appt due [facility] with [facility].  Scheduled message sent.

## 2022-10-19 NOTE — Telephone Encounter (Signed)
Per 3/6 IB reached out to schedule patient , patient aware of date and time of appointment.

## 2022-11-09 ENCOUNTER — Ambulatory Visit: Payer: No Typology Code available for payment source | Admitting: Physician Assistant

## 2022-11-09 ENCOUNTER — Ambulatory Visit (HOSPITAL_COMMUNITY)
Admission: RE | Admit: 2022-11-09 | Discharge: 2022-11-09 | Disposition: A | Discharge status: Home / Self Care | Payer: No Typology Code available for payment source | Source: Ambulatory Visit | Attending: Vascular Surgery

## 2022-11-09 ENCOUNTER — Ambulatory Visit (HOSPITAL_COMMUNITY)
Admission: RE | Admit: 2022-11-09 | Discharge: 2022-11-09 | Disposition: A | Discharge status: Home / Self Care | Payer: No Typology Code available for payment source | Source: Ambulatory Visit | Attending: Vascular Surgery | Admitting: Vascular Surgery

## 2022-11-09 VITALS — BP 119/70 | HR 103 | Temp 97.8°F | Resp 16 | Ht 66.0 in | Wt 224.0 lb

## 2022-11-09 DIAGNOSIS — I739 Peripheral vascular disease, unspecified: Secondary | ICD-10-CM

## 2022-11-09 DIAGNOSIS — Z95828 Presence of other vascular implants and grafts: Secondary | ICD-10-CM

## 2022-11-10 ENCOUNTER — Encounter: Payer: Self-pay | Admitting: Physician Assistant

## 2022-11-10 LAB — VAS US ABI WITH/WO TBI
Left ABI: 0.66
Right ABI: 0.63

## 2022-11-10 NOTE — Progress Notes (Signed)
Office Note     CC:  follow up Requesting Provider:  [facility]  HPI: Sean [name] is a 72 y.o. (February 05, 1951) male who presents for surveillance of PAD.  Surgical history significant for bilateral common femoral artery endarterectomies with right to left femoral to femoral bypass graft by Dr. Donzetta Matters on 11/04/2020.  Right groin subsequently required sartorius muscle flap with wound VAC however eventually healed.  Patient denies any claudication, rest pain, or tissue loss of bilateral lower extremities.  He requires 24/7 oxygen due to advanced COPD and has shortness of breath with minimal exertion.  He admittedly does not walk much due to his respiratory status.  He quit smoking in 2014.  He continues to take aspirin and statin daily.   Past Medical History:  Diagnosis Date   Alcohol abuse    Alcoholic hepatitis without ascites    Arthritis    Cataracts, bilateral    Colon polyps    COPD (chronic obstructive pulmonary disease) (HCC)    Fatty liver    GERD (gastroesophageal reflux disease)    Hyperlipidemia    Hypertension    Pneumonia    PTSD (post-traumatic stress disorder)    Skin carcinoma    Sleep apnea    Umbilical hernia    Vitamin D deficiency     Past Surgical History:  Procedure Laterality Date   ABDOMINAL AORTOGRAM W/LOWER EXTREMITY Bilateral 09/28/2020   Procedure: ABDOMINAL AORTOGRAM W/LOWER EXTREMITY;  Surgeon: Waynetta Sandy, MD;  Location: [facility];  Service: Cardiovascular;  Laterality: Bilateral;   APPLICATION OF WOUND VAC Right 01/12/2021   Procedure: APPLICATION OF WOUND VAC;  Surgeon: Waynetta Sandy, MD;  Location: [facility];  Service: Vascular;  Laterality: Right;   BACK SURGERY     ENDARTERECTOMY FEMORAL Left 11/04/2020   Procedure: LEFT COMMON FEMORAL ARTERY ENDARTERECTOMY;  Surgeon: Waynetta Sandy, MD;  Location: [place];  Service: Vascular;  Laterality: Left;   EYE SURGERY     FEMORAL-FEMORAL BYPASS GRAFT Bilateral  11/04/2020   Procedure: RIGHT TO LEFT FEMORAL-FEMORAL BYPASS;  Surgeon: Waynetta Sandy, MD;  Location: [place];  Service: Vascular;  Laterality: Bilateral;   HERNIA REPAIR     INCISION AND DRAINAGE OF WOUND Right 01/06/2021   Procedure: IRRIGATION AND DEBRIDEMENT GROIN;  Surgeon: Serafina Mitchell, MD;  Location: [place];  Service: Vascular;  Laterality: Right;   INCISION AND DRAINAGE OF WOUND Right 01/12/2021   Procedure: [facility] OUT AND MUSCLE FLAP;  Surgeon: Waynetta Sandy, MD;  Location: [place];  Service: Vascular;  Laterality: Right;   PATCH ANGIOPLASTY Bilateral 11/04/2020   Procedure: PATCH ANGIOPLASTY LEFT AND RIGHT COMMON FEMORAL ARTERY;  Surgeon: Waynetta Sandy, MD;  Location: [place];  Service: Vascular;  Laterality: Bilateral;   ULTRASOUND GUIDANCE FOR VASCULAR ACCESS Bilateral 11/04/2020   Procedure: ULTRASOUND GUIDANCE FOR VASCULAR ACCESS;  Surgeon: Waynetta Sandy, MD;  [facility];  Service: Vascular;  Laterality: Bilateral;   WISDOM TOOTH EXTRACTION      Social History   Socioeconomic History   Marital status: Single    Spouse name: Not on file   Number of children: 0   Years of education: Not on file   Highest education level: Not on file  Occupational History   Occupation: Retired   Occupation: retired  Tobacco Use   Smoking status: Former    Packs/day: 3.00    Years: 45.00    Additional pack years: 0.00    Total  pack years: 135.00    Types: Cigarettes    Quit date: 06/25/2013    Years since quitting: 9.3    Passive exposure: Never   Smokeless tobacco: Former    Quit date: 06/25/1976  Vaping Use   Vaping Use: Never used  Substance and Sexual Activity   Alcohol use: Yes    Alcohol/week: 30.0 standard drinks of alcohol    Types: 30 Cans of beer per week    Comment: 4 per day   Drug use: No   Sexual activity: Not on file  Other Topics Concern   Not on file  Social History Narrative   Not on file   Social  Determinants of Health   Financial Resource Strain: Not on file  Food Insecurity: Not on file  Transportation Needs: Not on file  Physical Activity: Not on file  Stress: Not on file  Social Connections: Not on file  Intimate Partner Violence: Not on file    Family History  Problem Relation Age of Onset   COPD Mother    Diabetes Mother    Hypertension Father    COPD Father    Heart disease Father    Arthritis Sister    Colon cancer Neg Hx    Stomach cancer Neg Hx    Esophageal cancer Neg Hx     Current Outpatient Medications  Medication Sig Dispense Refill   albuterol (PROVENTIL HFA;VENTOLIN HFA) 108 (90 BASE) MCG/ACT inhaler Inhale 2 puffs into the lungs every 6 (six) hours as needed for wheezing or shortness of breath.     albuterol (PROVENTIL) (2.5 MG/3ML) 0.083% nebulizer solution Take 2.5 mg by nebulization every 6 (six) hours as needed for wheezing or shortness of breath.     amLODipine (NORVASC) 2.5 MG tablet Take 2.5 mg by mouth daily.     amoxicillin (AMOXIL) 500 MG capsule Take 2 capsules (1,000 mg total) by mouth 2 (two) times daily. 120 capsule 1   aspirin EC 81 MG tablet Take 81 mg by mouth at bedtime. Swallow whole.     benazepril (LOTENSIN) 10 MG tablet Take 1 tablet (10 mg total) by mouth at bedtime. 90 tablet 3   Carboxymethylcellulose Sodium (THERATEARS PF OP) Place 2 drops into both eyes 3 (three) times daily.     cholecalciferol (VITAMIN D) 1000 UNITS tablet Take 1,000 Units by mouth 2 (two) times daily.     cilostazol (PLETAL) 100 MG tablet Take 100 mg by mouth 2 (two) times daily.     ferrous sulfate 325 (65 FE) MG tablet Take 1 tablet by mouth daily.     folic acid (FOLVITE) 1 MG tablet Take 1 mg by mouth daily.     LACTOBACILLUS PO Take 2 capsules by mouth daily.     Magnesium Oxide 420 MG TABS Take 420 mg by mouth every other day.     MENTHOL-METHYL SALICYLATE EX Apply 1 application topically 2 (two) times daily as needed (for knee pain).     metoprolol  (TOPROL-XL) 200 MG 24 hr tablet Take 200 mg by mouth daily.     montelukast (SINGULAIR) 10 MG tablet Take 10 mg by mouth at bedtime.     Multiple Vitamin (MULTIVITAMIN) tablet Take 1 tablet by mouth every other day. Vitamins for eyes     niacin 100 MG tablet Take 100 mg by mouth in the morning.     NON FORMULARY      Omega-3 Fatty Acids (FISH OIL) 1000 MG CAPS Take 1,000 mg by  mouth daily.     pantoprazole (PROTONIX) 40 MG tablet Take 40 mg by mouth 2 (two) times daily.     rosuvastatin (CRESTOR) 20 MG tablet Take 10 mg by mouth daily.     thiamine 100 MG tablet Take 100 mg by mouth daily.     Tiotropium Bromide-Olodaterol (STIOLTO RESPIMAT) 2.5-2.5 MCG/ACT AERS Inhale 2 each into the lungs daily.     Current Facility-Administered Medications  Medication Dose Route Frequency Provider Last Rate Last Admin   0.9 %  sodium chloride infusion  250 mL Intravenous PRN Baglia, Corrina, PA-C       alum & mag hydroxide-simeth (MAALOX/MYLANTA) 200-200-20 MG/5ML suspension 15-30 mL  15-30 mL Oral Q2H PRN Baglia, Corrina, PA-C       docusate sodium (COLACE) capsule 100 mg  100 mg Oral BID Baglia, Corrina, PA-C       guaiFENesin-dextromethorphan (ROBITUSSIN DM) 100-10 MG/5ML syrup 15 mL  15 mL Oral Q4H PRN Baglia, Corrina, PA-C       heparin injection 5,000 Units  5,000 Units Subcutaneous Q8H Baglia, Corrina, PA-C       hydrALAZINE (APRESOLINE) injection 5 mg  5 mg Intravenous Q20 Min PRN Baglia, Corrina, PA-C       labetalol (NORMODYNE) injection 10 mg  10 mg Intravenous Q10 min PRN Baglia, Corrina, PA-C       metoprolol tartrate (LOPRESSOR) injection 2-5 mg  2-5 mg Intravenous Q2H PRN Baglia, Corrina, PA-C       ondansetron (ZOFRAN) injection 4 mg  4 mg Intravenous Q6H PRN Baglia, Corrina, PA-C       phenol (CHLORASEPTIC) mouth spray 1 spray  1 spray Mouth/Throat PRN Baglia, Corrina, PA-C       senna-docusate (Senokot-S) tablet 1 tablet  1 tablet Oral QHS PRN Baglia, Corrina, PA-C       sodium chloride  flush (NS) 0.9 % injection 3 mL  3 mL Intravenous Q12H Baglia, Corrina, PA-C       sodium chloride flush (NS) 0.9 % injection 3 mL  3 mL Intravenous PRN Baglia, Corrina, PA-C       Facility-Administered Medications Ordered in Other Visits  Medication Dose Route Frequency Provider Last Rate Last Admin   acetaminophen (TYLENOL) tablet 325-650 mg  325-650 mg Oral Q4H PRN Baglia, Corrina, PA-C       Or   acetaminophen (TYLENOL) suppository 320-640 mg  320-640 mg Rectal Q4H PRN Baglia, Corrina, PA-C        Allergies  Allergen Reactions   Amlodipine Swelling and Other (See Comments)    Edema of lower extremities     REVIEW OF SYSTEMS:   [X]  denotes positive finding, [ ]  denotes negative finding Cardiac  Comments:  Chest pain or chest pressure:    Shortness of breath upon exertion:    Short of breath when lying flat:    Irregular heart rhythm:        Vascular    Pain in calf, thigh, or hip brought on by ambulation:    Pain in feet at night that wakes you up from your sleep:     Blood clot in your veins:    Leg swelling:         Pulmonary    Oxygen at home:    Productive cough:     Wheezing:         Neurologic    Sudden weakness in arms or legs:     Sudden numbness in arms or legs:  Sudden onset of difficulty speaking or slurred speech:    Temporary loss of vision in one eye:     Problems with dizziness:         Gastrointestinal    Blood in stool:     Vomited blood:         Genitourinary    Burning when urinating:     Blood in urine:        Psychiatric    Major depression:         Hematologic    Bleeding problems:    Problems with blood clotting too easily:        Skin    Rashes or ulcers:        Constitutional    Fever or chills:      PHYSICAL EXAMINATION:  Vitals:   11/09/22 1334  BP: 119/70  Pulse: (!) 103  Resp: 16  Temp: 97.8 F (36.6 C)  TempSrc: Temporal  SpO2: 95%  Weight: 224 lb (101.6 kg)  Height: 5\' 6"  (1.676 m)    General:  WDWN  in NAD; vital signs documented above Gait: Not observed HENT: WNL, normocephalic Pulmonary: normal non-labored breathing , without Rales, rhonchi,  wheezing Cardiac: regular HR Abdomen: soft, NT, no masses Skin: without rashes Vascular Exam/Pulses: 1+ femoral pulses bilaterally absent pedal pulses Extremities: without ischemic changes, without Gangrene , without cellulitis; without open wounds;  Musculoskeletal: no muscle wasting or atrophy  Neurologic: A&O X 3 Psychiatric:  The pt has Normal affect.   Non-Invasive Vascular Imaging:   Right Graft #1: Righ to Left FFBPG  +------------------+--------+--------+----------+--------+                   PSV cm/sStenosisWaveform  Comments  +------------------+--------+--------+----------+--------+  Inflow           158             monophasic          +------------------+--------+--------+----------+--------+  Prox Anastomosis  102             monophasic          +------------------+--------+--------+----------+--------+  Proximal Graft    82              monophasic          +------------------+--------+--------+----------+--------+  Mid Graft         48              monophasic          +------------------+--------+--------+----------+--------+  Distal Graft      44              monophasic          +------------------+--------+--------+----------+--------+  Distal Anastomosis48              monophasic          +------------------+--------+--------+----------+--------+  Outflow          65              monophasic            ABI/TBIToday's ABIToday's TBIPrevious ABIPrevious TBI  +-------+-----------+-----------+------------+------------+  Right 0.63       0.31       0.79        0.59          +-------+-----------+-----------+------------+------------+  Left  0.66       0.48       0.89        0.56          +-------+-----------+-----------+------------+------------+  ASSESSMENT/PLAN:: 72 y.o. male here for follow up for surveillance of PAD with history of right to left femorofemoral bypass graft  -Subjectively, the patient is without claudication rest pain or tissue loss of bilateral lower extremities.  He however has limited mobility due to his advanced COPD and oxygen requirement.  We again see low velocities throughout the femoral to femoral bypass graft.  There has also been bilateral drop in ABIs.  We have held off on angiography due to patient's inability to lay flat without shortness of breath.  He will also be high risk under general anesthesia in the hybrid operating room due to his respiratory status.  I am concerned that his femoral to femoral bypass graft may be threatened given the duplex findings.  Case will be discussed with Dr. Randie Heinzain and I will update the patient with treatment plans by telephone.  ADDENDUM 11/23/22: Case was discussed with Dr. Randie Heinzain.  Given the findings on duplex, there is concern for a threatened femorofemoral bypass graft.  Patient's concerns with laying flat are well-documented.  We will start by ordering a CTA aorta with runoff to further evaluate any potential flow-limiting stenosis.  He will then follow-up with Dr. Randie Heinzain to review the results and possibly discuss intervention.  I spoke with Sean Baneroy over the phone today and he is agreeable to this plan.  Sean RutterMatthew Adriyana Greenbaum, PA-C [facility] [phone]  Clinic MD:   Edilia Boickson

## 2022-11-25 ENCOUNTER — Other Ambulatory Visit: Payer: Self-pay

## 2022-11-25 DIAGNOSIS — I739 Peripheral vascular disease, unspecified: Secondary | ICD-10-CM

## 2022-12-20 ENCOUNTER — Ambulatory Visit (HOSPITAL_COMMUNITY)
Admission: RE | Admit: 2022-12-20 | Discharge: 2022-12-20 | Disposition: A | Discharge status: Home / Self Care | Payer: No Typology Code available for payment source | Source: Ambulatory Visit | Attending: Vascular Surgery | Admitting: Vascular Surgery

## 2022-12-20 DIAGNOSIS — I739 Peripheral vascular disease, unspecified: Secondary | ICD-10-CM | POA: Diagnosis present

## 2022-12-20 MED ORDER — IOHEXOL 350 MG/ML SOLN
100.0000 mL | Freq: Once | INTRAVENOUS | Status: AC | PRN
  Administered 2022-12-20: 100 mL via INTRAVENOUS

## 2022-12-21 ENCOUNTER — Encounter: Payer: Self-pay | Admitting: Vascular Surgery

## 2022-12-21 ENCOUNTER — Ambulatory Visit (INDEPENDENT_AMBULATORY_CARE_PROVIDER_SITE_OTHER): Payer: No Typology Code available for payment source | Admitting: Vascular Surgery

## 2022-12-21 VITALS — BP 125/71 | HR 89 | Temp 98.2°F | Resp 20 | Ht 66.0 in | Wt 220.0 lb

## 2022-12-21 DIAGNOSIS — I739 Peripheral vascular disease, unspecified: Secondary | ICD-10-CM | POA: Diagnosis not present

## 2022-12-21 NOTE — Progress Notes (Signed)
Patient ID: Sean [name], male   DOB: [birth year], 72 y.o.   MRN: [id number]  Reason for Consult: Follow-up   [facility]  Subjective:     HPI:  Sean [name] is a 72 y.o. male underwent bilateral common femoral endarterectomy with right to left femorofemoral bypass for occluded left common external iliac artery that could not be crossed even after endarterectomy on the left.  He subsequently had right sartorius muscle flap placed.  Recently he followed up and was found to have decreased velocities in his femorofemoral bypass and ABIs have dropped as well.  He is not symptomatic from this and he is on 2.5 L of oxygen and rest states that he is unable to lie flat.  He has not been exercising much distress his legs states that he does not have too much pain in his legs denies tissue loss or rest pain.  Past Medical History:  Diagnosis Date   Alcohol abuse    Alcoholic hepatitis without ascites    Arthritis    Cataracts, bilateral    Colon polyps    COPD (chronic obstructive pulmonary disease) (HCC)    Fatty liver    GERD (gastroesophageal reflux disease)    Hyperlipidemia    Hypertension    Pneumonia    PTSD (post-traumatic stress disorder)    Skin carcinoma    Sleep apnea    Umbilical hernia    Vitamin D deficiency    Family History  Problem Relation Age of Onset   COPD Mother    Diabetes Mother    Hypertension Father    COPD Father    Heart disease Father    Arthritis Sister    Colon cancer Neg Hx    Stomach cancer Neg Hx    Esophageal cancer Neg Hx    Past Surgical History:  Procedure Laterality Date   ABDOMINAL AORTOGRAM W/LOWER EXTREMITY Bilateral 09/28/2020   Procedure: ABDOMINAL AORTOGRAM W/LOWER EXTREMITY;  Surgeon: Maeola Harman, MD;  Location: [facility] INVASIVE CV LAB;  Service: Cardiovascular;  Laterality: Bilateral;   APPLICATION OF WOUND VAC Right 01/12/2021   Procedure: APPLICATION OF WOUND VAC;  Surgeon: Maeola Harman, MD;   Location: [facility] OR;  Service: Vascular;  Laterality: Right;   BACK SURGERY     ENDARTERECTOMY FEMORAL Left 11/04/2020   Procedure: LEFT COMMON FEMORAL ARTERY ENDARTERECTOMY;  Surgeon: Maeola Harman, MD;  Location: [facility] OR;  Service: Vascular;  Laterality: Left;   EYE SURGERY     FEMORAL-FEMORAL BYPASS GRAFT Bilateral 11/04/2020   Procedure: RIGHT TO LEFT FEMORAL-FEMORAL BYPASS;  Surgeon: Maeola Harman, MD;  Location: [facility] OR;  Service: Vascular;  Laterality: Bilateral;   HERNIA REPAIR     INCISION AND DRAINAGE OF WOUND Right 01/06/2021   Procedure: IRRIGATION AND DEBRIDEMENT GROIN;  Surgeon: Nada Libman, MD;  Location: [facility] OR;  Service: Vascular;  Laterality: Right;   INCISION AND DRAINAGE OF WOUND Right 01/12/2021   Procedure: Drucie Ip [facility] OUT AND MUSCLE FLAP;  Surgeon: Maeola Harman, MD;  Location: [facility] OR;  Service: Vascular;  Laterality: Right;   PATCH ANGIOPLASTY Bilateral 11/04/2020   Procedure: PATCH ANGIOPLASTY LEFT AND RIGHT COMMON FEMORAL ARTERY;  Surgeon: Maeola Harman, MD;  Location: [facility] OR;  Service: Vascular;  Laterality: Bilateral;   ULTRASOUND GUIDANCE FOR VASCULAR ACCESS Bilateral 11/04/2020   Procedure: ULTRASOUND GUIDANCE FOR VASCULAR ACCESS;  Surgeon: Maeola Harman, MD;  Location: [facility] OR;  Service: Vascular;  Laterality: Bilateral;   WISDOM  TOOTH EXTRACTION      Short Social History:  Social History   Tobacco Use   Smoking status: Former    Packs/day: 3.00    Years: 45.00    Additional pack years: 0.00    Total pack years: 135.00    Types: Cigarettes    Quit date: 06/25/2013    Years since quitting: 9.4    Passive exposure: Never   Smokeless tobacco: Former    Quit date: 06/25/1976  Substance Use Topics   Alcohol use: Yes    Alcohol/week: 30.0 standard drinks of alcohol    Types: 30 Cans of beer per week    Comment: 4 per day    Allergies  Allergen Reactions   Amlodipine Swelling and Other (See Comments)     Edema of lower extremities    Current Outpatient Medications  Medication Sig Dispense Refill   albuterol (PROVENTIL HFA;VENTOLIN HFA) 108 (90 BASE) MCG/ACT inhaler Inhale 2 puffs into the lungs every 6 (six) hours as needed for wheezing or shortness of breath.     albuterol (PROVENTIL) (2.5 MG/3ML) 0.083% nebulizer solution Take 2.5 mg by nebulization every 6 (six) hours as needed for wheezing or shortness of breath.     amLODipine (NORVASC) 2.5 MG tablet Take 2.5 mg by mouth daily.     amoxicillin (AMOXIL) 500 MG capsule Take 2 capsules (1,000 mg total) by mouth 2 (two) times daily. 120 capsule 1   aspirin EC 81 MG tablet Take 81 mg by mouth at bedtime. Swallow whole.     benazepril (LOTENSIN) 10 MG tablet Take 1 tablet (10 mg total) by mouth at bedtime. 90 tablet 3   Carboxymethylcellulose Sodium (THERATEARS PF OP) Place 2 drops into both eyes 3 (three) times daily.     cholecalciferol (VITAMIN D) 1000 UNITS tablet Take 1,000 Units by mouth 2 (two) times daily.     cilostazol (PLETAL) 100 MG tablet Take 100 mg by mouth 2 (two) times daily.     ferrous sulfate 325 (65 FE) MG tablet Take 1 tablet by mouth daily.     folic acid (FOLVITE) 1 MG tablet Take 1 mg by mouth daily.     LACTOBACILLUS PO Take 2 capsules by mouth daily.     Magnesium Oxide 420 MG TABS Take 420 mg by mouth every other day.     MENTHOL-METHYL SALICYLATE EX Apply 1 application topically 2 (two) times daily as needed (for knee pain).     metoprolol (TOPROL-XL) 200 MG 24 hr tablet Take 200 mg by mouth daily.     montelukast (SINGULAIR) 10 MG tablet Take 10 mg by mouth at bedtime.     Multiple Vitamin (MULTIVITAMIN) tablet Take 1 tablet by mouth every other day. Vitamins for eyes     niacin 100 MG tablet Take 100 mg by mouth in the morning.     NON FORMULARY      Omega-3 Fatty Acids (FISH OIL) 1000 MG CAPS Take 1,000 mg by mouth daily.     pantoprazole (PROTONIX) 40 MG tablet Take 40 mg by mouth 2 (two) times daily.      rosuvastatin (CRESTOR) 20 MG tablet Take 10 mg by mouth daily.     thiamine 100 MG tablet Take 100 mg by mouth daily.     Tiotropium Bromide-Olodaterol (STIOLTO RESPIMAT) 2.5-2.5 MCG/ACT AERS Inhale 2 each into the lungs daily.     Current Facility-Administered Medications  Medication Dose Route Frequency Provider Last Rate Last Admin   0.9 %  sodium chloride infusion  250 mL Intravenous PRN Baglia, Corrina, PA-C       alum & mag hydroxide-simeth (MAALOX/MYLANTA) 200-200-20 MG/5ML suspension 15-30 mL  15-30 mL Oral Q2H PRN Baglia, Corrina, PA-C       docusate sodium (COLACE) capsule 100 mg  100 mg Oral BID Baglia, Corrina, PA-C       guaiFENesin-dextromethorphan (ROBITUSSIN DM) 100-10 MG/5ML syrup 15 mL  15 mL Oral Q4H PRN Baglia, Corrina, PA-C       heparin injection 5,000 Units  5,000 Units Subcutaneous Q8H Baglia, Corrina, PA-C       hydrALAZINE (APRESOLINE) injection 5 mg  5 mg Intravenous Q20 Min PRN Baglia, Corrina, PA-C       labetalol (NORMODYNE) injection 10 mg  10 mg Intravenous Q10 min PRN Baglia, Corrina, PA-C       metoprolol tartrate (LOPRESSOR) injection 2-5 mg  2-5 mg Intravenous Q2H PRN Baglia, Corrina, PA-C       ondansetron (ZOFRAN) injection 4 mg  4 mg Intravenous Q6H PRN Baglia, Corrina, PA-C       phenol (CHLORASEPTIC) mouth spray 1 spray  1 spray Mouth/Throat PRN Baglia, Corrina, PA-C       senna-docusate (Senokot-S) tablet 1 tablet  1 tablet Oral QHS PRN Baglia, Corrina, PA-C       sodium chloride flush (NS) 0.9 % injection 3 mL  3 mL Intravenous Q12H Baglia, Corrina, PA-C       sodium chloride flush (NS) 0.9 % injection 3 mL  3 mL Intravenous PRN Baglia, Corrina, PA-C       Facility-Administered Medications Ordered in Other Visits  Medication Dose Route Frequency Provider Last Rate Last Admin   acetaminophen (TYLENOL) tablet 325-650 mg  325-650 mg Oral Q4H PRN Baglia, Corrina, PA-C       Or   acetaminophen (TYLENOL) suppository 320-640 mg  320-640 mg Rectal Q4H PRN  Baglia, Corrina, PA-C        Review of Systems  Constitutional:  Constitutional negative. HENT: HENT negative.  Eyes: Eyes negative.  Respiratory: Positive for shortness of breath.  Cardiovascular: Cardiovascular negative.  GI: Gastrointestinal negative.  Musculoskeletal: Positive for leg pain.  Skin: Skin negative.  Neurological: Neurological negative. Hematologic: Hematologic/lymphatic negative.  Psychiatric: Psychiatric negative.        Objective:  Objective   Vitals:   12/21/22 1207  BP: 125/71  Pulse: 89  Resp: 20  Temp: 98.2 F (36.8 C)  SpO2: 92%  Weight: 220 lb (99.8 kg)  Height: 5\' 6"  (1.676 m)   Body mass index is 35.51 kg/m.  Physical Exam Constitutional:      Appearance: He is obese.  HENT:     Head: Normocephalic.     Nose:     Comments: On nasal cannula oxygen    Mouth/Throat:     Mouth: Mucous membranes are moist.  Eyes:     Pupils: Pupils are equal, round, and reactive to light.  Cardiovascular:     Comments: Palpable femorofemoral bypass graft Abdominal:     General: Abdomen is flat.     Palpations: Abdomen is soft.  Skin:    General: Skin is warm.     Capillary Refill: Capillary refill takes 2 to 3 seconds.  Neurological:     General: No focal deficit present.     Mental Status: He is alert.  Psychiatric:        Mood and Affect: Mood normal.        Thought Content: Thought content normal.  Judgment: Judgment normal.     Data: ABI Findings:  +---------+------------------+-----+----------+--------+  Right   Rt Pressure (mmHg)IndexWaveform  Comment   +---------+------------------+-----+----------+--------+  Brachial 141                                        +---------+------------------+-----+----------+--------+  PTA     82                0.58 monophasic          +---------+------------------+-----+----------+--------+  DP      89                0.63 monophasic           +---------+------------------+-----+----------+--------+  Great Toe44                0.31 Absent              +---------+------------------+-----+----------+--------+   +---------+------------------+-----+----------+-------+  Left    Lt Pressure (mmHg)IndexWaveform  Comment  +---------+------------------+-----+----------+-------+  Brachial 133                                       +---------+------------------+-----+----------+-------+  PTA     93                0.66 monophasic         +---------+------------------+-----+----------+-------+  DP      90                0.64 biphasic           +---------+------------------+-----+----------+-------+  Great Toe67                0.48 Abnormal           +---------+------------------+-----+----------+-------+   +-------+-----------+-----------+------------+------------+  ABI/TBIToday's ABIToday's TBIPrevious ABIPrevious TBI  +-------+-----------+-----------+------------+------------+  Right 0.63       0.31       0.79        0.59          +-------+-----------+-----------+------------+------------+  Left  0.66       0.48       0.89        0.56          +-------+-----------+-----------+------------+------------+   Right Graft #1: Righ to Left FFBPG  +------------------+--------+--------+----------+--------+                   PSV cm/sStenosisWaveform  Comments  +------------------+--------+--------+----------+--------+  Inflow           158             monophasic          +------------------+--------+--------+----------+--------+  Prox Anastomosis  102             monophasic          +------------------+--------+--------+----------+--------+  Proximal Graft    82              monophasic          +------------------+--------+--------+----------+--------+  Mid Graft         48              monophasic           +------------------+--------+--------+----------+--------+  Distal Graft      44              monophasic          +------------------+--------+--------+----------+--------+  Distal Anastomosis48              monophasic          +------------------+--------+--------+----------+--------+  Outflow          65              monophasic          +------------------+--------+--------+----------+--------+   CTA IMPRESSION: VASCULAR   1. 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 2. Widely patent right to left fem-fem bypass graft without evidence of complication. 3. 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 4. Focal moderate stenosis in the right external iliac artery secondary to bulky calcified atherosclerotic plaque. 5. Extensive calcified plaque throughout the bilateral superficial femoral arteries with probable moderate stenosis in the bilateral adductor canals. Additionally, calcified plaque results in stenosis in the P1 segment of the right popliteal artery. 6. Runoff arteries appear patent bilaterally. 7.  Aortic Atherosclerosis (ICD10-I70.0). 8. Coronary artery atherosclerotic vascular calcifications.           Assessment/Plan:     72 year old male with history as listed above with bilateral common femoral endarterectomies and right lower extremity past decreased velocities although all are greater than 40.  Given his high risk for any procedures and his inability to lay flat due to shortness of breath I would not recommend any procedures that are not absolutely necessary.  As such we will follow him up in 3 to 4 months if his ABIs and the bypass graft duplex demonstrates stable velocities we will push him out to 58-month intervals.  I did discuss the signs and symptoms of acute limb ischemia and he demonstrates good understanding and short of that we will see him back in a few months with repeat studies.     Maeola Harman MD [facility]

## 2022-12-28 ENCOUNTER — Other Ambulatory Visit: Payer: No Typology Code available for payment source

## 2022-12-28 ENCOUNTER — Ambulatory Visit: Payer: No Typology Code available for payment source | Admitting: Internal Medicine

## 2023-01-02 ENCOUNTER — Other Ambulatory Visit: Payer: Self-pay

## 2023-01-02 DIAGNOSIS — Z95828 Presence of other vascular implants and grafts: Secondary | ICD-10-CM

## 2023-01-02 DIAGNOSIS — I739 Peripheral vascular disease, unspecified: Secondary | ICD-10-CM

## 2023-01-04 ENCOUNTER — Inpatient Hospital Stay: Payer: No Typology Code available for payment source | Attending: Internal Medicine | Admitting: Internal Medicine

## 2023-01-04 ENCOUNTER — Inpatient Hospital Stay: Payer: No Typology Code available for payment source

## 2023-01-04 VITALS — BP 154/70 | HR 85 | Temp 97.9°F | Resp 18 | Wt 217.8 lb

## 2023-01-04 DIAGNOSIS — F1010 Alcohol abuse, uncomplicated: Secondary | ICD-10-CM | POA: Diagnosis not present

## 2023-01-04 DIAGNOSIS — K921 Melena: Secondary | ICD-10-CM | POA: Diagnosis not present

## 2023-01-04 DIAGNOSIS — D472 Monoclonal gammopathy: Secondary | ICD-10-CM

## 2023-01-04 DIAGNOSIS — D539 Nutritional anemia, unspecified: Secondary | ICD-10-CM | POA: Diagnosis not present

## 2023-01-04 DIAGNOSIS — D500 Iron deficiency anemia secondary to blood loss (chronic): Secondary | ICD-10-CM | POA: Insufficient documentation

## 2023-01-04 DIAGNOSIS — K922 Gastrointestinal hemorrhage, unspecified: Secondary | ICD-10-CM | POA: Diagnosis not present

## 2023-01-04 LAB — CMP (CANCER CENTER ONLY)
ALT: 28 U/L (ref 0–44)
AST: 27 U/L (ref 15–41)
Albumin: 4.5 g/dL (ref 3.5–5.0)
Alkaline Phosphatase: 68 U/L (ref 38–126)
Anion gap: 5 (ref 5–15)
BUN: 14 mg/dL (ref 8–23)
CO2: 30 mmol/L (ref 22–32)
Calcium: 9.4 mg/dL (ref 8.9–10.3)
Chloride: 104 mmol/L (ref 98–111)
Creatinine: 0.82 mg/dL (ref 0.61–1.24)
GFR, Estimated: >60 mL/min (ref >60)
Glucose, Bld: 97 mg/dL (ref 70–99)
Potassium: 4.2 mmol/L (ref 3.5–5.1)
Sodium: 139 mmol/L (ref 135–145)
Total Bilirubin: 0.7 mg/dL (ref 0.3–1.2)
Total Protein: 7.4 g/dL (ref 6.5–8.1)

## 2023-01-04 LAB — CBC WITH DIFFERENTIAL (CANCER CENTER ONLY)
Abs Immature Granulocytes: 0.08 10*3/uL — ABNORMAL HIGH (ref 0.00–0.07)
Basophils Absolute: 0.1 10*3/uL (ref 0.0–0.1)
Basophils Relative: 1 %
Eosinophils Absolute: 0.1 10*3/uL (ref 0.0–0.5)
Eosinophils Relative: 1 %
HCT: 38 % — ABNORMAL LOW (ref 39.0–52.0)
Hemoglobin: 12.8 g/dL — ABNORMAL LOW (ref 13.0–17.0)
Immature Granulocytes: 1 %
Lymphocytes Relative: 27 %
Lymphs Abs: 2 10*3/uL (ref 0.7–4.0)
MCH: 36.6 pg — ABNORMAL HIGH (ref 26.0–34.0)
MCHC: 33.7 g/dL (ref 30.0–36.0)
MCV: 108.6 fL — ABNORMAL HIGH (ref 80.0–100.0)
Monocytes Absolute: 0.6 10*3/uL (ref 0.1–1.0)
Monocytes Relative: 8 %
Neutro Abs: 4.4 10*3/uL (ref 1.7–7.7)
Neutrophils Relative %: 62 %
Platelet Count: 195 10*3/uL (ref 150–400)
RBC: 3.5 MIL/uL — ABNORMAL LOW (ref 4.22–5.81)
RDW: 14.6 % (ref 11.5–15.5)
WBC Count: 7.2 10*3/uL (ref 4.0–10.5)
nRBC: 0 % (ref 0.0–0.2)

## 2023-01-04 LAB — IRON AND IRON BINDING CAPACITY (CC-WL,HP ONLY)
Iron: 181 ug/dL (ref 45–182)
Saturation Ratios: 55 % — ABNORMAL HIGH (ref 17.9–39.5)
TIBC: 329 ug/dL (ref 250–450)
UIBC: 148 ug/dL (ref 117–376)

## 2023-01-04 LAB — FERRITIN: Ferritin: 15 ng/mL — ABNORMAL LOW (ref 24–336)

## 2023-01-04 NOTE — Progress Notes (Signed)
[facility] [phone]   [phone]  OFFICE PROGRESS NOTE  [facility] [address] [place] [address]  DIAGNOSIS: iron deficiency anemia with unclear etiology but likely could be secondary to gastrointestinal blood loss and the patient with long history of alcohol abuse.   PRIOR THERAPY: Iron for lesion with Venofer 300 Mg IV weekly for 3 weeks.  CURRENT THERAPY: Over-the-counter oral iron tablets  INTERVAL HISTORY: Sean [name] 72 y.o. male returns to the clinic today for follow-up visit.  The patient is feeling fine today with no concerning complaints except for the baseline shortness of breath and he is currently on home oxygen.  He was seen by gastroenterology at the [facility] facility and colonoscopy showed no concerning findings except for likely hemorrhoids according to the patient he denied having any chest pain, cough or hemoptysis.  He has no nausea, vomiting, diarrhea or constipation.  He denied having any headache or visual changes.  He has no recent weight loss or night sweats.  He is here today for evaluation with repeat blood work.  MEDICAL HISTORY: Past Medical History:  Diagnosis Date   Alcohol abuse    Alcoholic hepatitis without ascites    Arthritis    Cataracts, bilateral    Colon polyps    COPD (chronic obstructive pulmonary disease) (HCC)    Fatty liver    GERD (gastroesophageal reflux disease)    Hyperlipidemia    Hypertension    Pneumonia    PTSD (post-traumatic stress disorder)    Skin carcinoma    Sleep apnea    Umbilical hernia    Vitamin D deficiency     ALLERGIES:  is allergic to amlodipine.  MEDICATIONS:  Current Outpatient Medications  Medication Sig Dispense Refill   albuterol (PROVENTIL HFA;VENTOLIN HFA) 108 (90 BASE) MCG/ACT inhaler Inhale 2 puffs into the lungs every 6 (six) hours as needed for wheezing or shortness of breath.     albuterol (PROVENTIL) (2.5 MG/3ML) 0.083% nebulizer  solution Take 2.5 mg by nebulization every 6 (six) hours as needed for wheezing or shortness of breath.     amLODipine (NORVASC) 2.5 MG tablet Take 2.5 mg by mouth daily.     amoxicillin (AMOXIL) 500 MG capsule Take 2 capsules (1,000 mg total) by mouth 2 (two) times daily. 120 capsule 1   aspirin EC 81 MG tablet Take 81 mg by mouth at bedtime. Swallow whole.     benazepril (LOTENSIN) 10 MG tablet Take 1 tablet (10 mg total) by mouth at bedtime. 90 tablet 3   Carboxymethylcellulose Sodium (THERATEARS PF OP) Place 2 drops into both eyes 3 (three) times daily.     cholecalciferol (VITAMIN D) 1000 UNITS tablet Take 1,000 Units by mouth 2 (two) times daily.     cilostazol (PLETAL) 100 MG tablet Take 100 mg by mouth 2 (two) times daily.     ferrous sulfate 325 (65 FE) MG tablet Take 1 tablet by mouth daily.     folic acid (FOLVITE) 1 MG tablet Take 1 mg by mouth daily.     LACTOBACILLUS PO Take 2 capsules by mouth daily.     Magnesium Oxide 420 MG TABS Take 420 mg by mouth every other day.     MENTHOL-METHYL SALICYLATE EX Apply 1 application topically 2 (two) times daily as needed (for knee pain).     metoprolol (TOPROL-XL) 200 MG 24 hr tablet Take 200 mg by mouth daily.     montelukast (SINGULAIR)  10 MG tablet Take 10 mg by mouth at bedtime.     Multiple Vitamin (MULTIVITAMIN) tablet Take 1 tablet by mouth every other day. Vitamins for eyes     niacin 100 MG tablet Take 100 mg by mouth in the morning.     NON FORMULARY      Omega-3 Fatty Acids (FISH OIL) 1000 MG CAPS Take 1,000 mg by mouth daily.     pantoprazole (PROTONIX) 40 MG tablet Take 40 mg by mouth 2 (two) times daily.     rosuvastatin (CRESTOR) 20 MG tablet Take 10 mg by mouth daily.     thiamine 100 MG tablet Take 100 mg by mouth daily.     Tiotropium Bromide-Olodaterol (STIOLTO RESPIMAT) 2.5-2.5 MCG/ACT AERS Inhale 2 each into the lungs daily.     Current Facility-Administered Medications  Medication Dose Route Frequency Provider Last  Rate Last Admin   0.9 %  sodium chloride infusion  250 mL Intravenous PRN Baglia, Corrina, PA-C       alum & mag hydroxide-simeth (MAALOX/MYLANTA) 200-200-20 MG/5ML suspension 15-30 mL  15-30 mL Oral Q2H PRN Baglia, Corrina, PA-C       docusate sodium (COLACE) capsule 100 mg  100 mg Oral BID Baglia, Corrina, PA-C       guaiFENesin-dextromethorphan (ROBITUSSIN DM) 100-10 MG/5ML syrup 15 mL  15 mL Oral Q4H PRN Baglia, Corrina, PA-C       heparin injection 5,000 Units  5,000 Units Subcutaneous Q8H Baglia, Corrina, PA-C       hydrALAZINE (APRESOLINE) injection 5 mg  5 mg Intravenous Q20 Min PRN Baglia, Corrina, PA-C       labetalol (NORMODYNE) injection 10 mg  10 mg Intravenous Q10 min PRN Baglia, Corrina, PA-C       metoprolol tartrate (LOPRESSOR) injection 2-5 mg  2-5 mg Intravenous Q2H PRN Baglia, Corrina, PA-C       ondansetron (ZOFRAN) injection 4 mg  4 mg Intravenous Q6H PRN Baglia, Corrina, PA-C       phenol (CHLORASEPTIC) mouth spray 1 spray  1 spray Mouth/Throat PRN Baglia, Corrina, PA-C       senna-docusate (Senokot-S) tablet 1 tablet  1 tablet Oral QHS PRN Baglia, Corrina, PA-C       sodium chloride flush (NS) 0.9 % injection 3 mL  3 mL Intravenous Q12H Baglia, Corrina, PA-C       sodium chloride flush (NS) 0.9 % injection 3 mL  3 mL Intravenous PRN Baglia, Corrina, PA-C       Facility-Administered Medications Ordered in Other Visits  Medication Dose Route Frequency Provider Last Rate Last Admin   acetaminophen (TYLENOL) tablet 325-650 mg  325-650 mg Oral Q4H PRN Baglia, Corrina, PA-C       Or   acetaminophen (TYLENOL) suppository 320-640 mg  320-640 mg Rectal Q4H PRN Baglia, Corrina, PA-C        SURGICAL HISTORY:  Past Surgical History:  Procedure Laterality Date   ABDOMINAL AORTOGRAM W/LOWER EXTREMITY Bilateral 09/28/2020   Procedure: ABDOMINAL AORTOGRAM W/LOWER EXTREMITY;  Surgeon: Maeola Harman, MD;  Location: [facility] INVASIVE CV LAB;  Service: Cardiovascular;  Laterality:  Bilateral;   APPLICATION OF WOUND VAC Right 01/12/2021   Procedure: APPLICATION OF WOUND VAC;  Surgeon: Maeola Harman, MD;  Location: [facility] OR;  Service: Vascular;  Laterality: Right;   BACK SURGERY     ENDARTERECTOMY FEMORAL Left 11/04/2020   Procedure: LEFT COMMON FEMORAL ARTERY ENDARTERECTOMY;  Surgeon: Maeola Harman, MD;  Location: [facility] OR;  Service: Vascular;  Laterality: Left;   EYE SURGERY     FEMORAL-FEMORAL BYPASS GRAFT Bilateral 11/04/2020   Procedure: RIGHT TO LEFT FEMORAL-FEMORAL BYPASS;  Surgeon: Maeola Harman, MD;  Location: [facility] OR;  Service: Vascular;  Laterality: Bilateral;   HERNIA REPAIR     INCISION AND DRAINAGE OF WOUND Right 01/06/2021   Procedure: IRRIGATION AND DEBRIDEMENT GROIN;  Surgeon: Nada Libman, MD;  Location: [facility] OR;  Service: Vascular;  Laterality: Right;   INCISION AND DRAINAGE OF WOUND Right 01/12/2021   Procedure: Drucie Ip [facility] OUT AND MUSCLE FLAP;  Surgeon: Maeola Harman, MD;  Location: [facility] OR;  Service: Vascular;  Laterality: Right;   PATCH ANGIOPLASTY Bilateral 11/04/2020   Procedure: PATCH ANGIOPLASTY LEFT AND RIGHT COMMON FEMORAL ARTERY;  Surgeon: Maeola Harman, MD;  Location: [facility] OR;  Service: Vascular;  Laterality: Bilateral;   ULTRASOUND GUIDANCE FOR VASCULAR ACCESS Bilateral 11/04/2020   Procedure: ULTRASOUND GUIDANCE FOR VASCULAR ACCESS;  Surgeon: Maeola Harman, MD;  Location: [facility] OR;  Service: Vascular;  Laterality: Bilateral;   WISDOM TOOTH EXTRACTION      REVIEW OF SYSTEMS:  A comprehensive review of systems was negative except for: Respiratory: positive for dyspnea on exertion   PHYSICAL EXAMINATION: General appearance: alert, cooperative, fatigued, and no distress Head: Normocephalic, without obvious abnormality, atraumatic Neck: no adenopathy, no JVD, supple, symmetrical, trachea midline, and thyroid not enlarged, symmetric, no tenderness/mass/nodules Lymph nodes: Cervical,  supraclavicular, and axillary nodes normal. Resp: clear to auscultation bilaterally Back: symmetric, no curvature. ROM normal. No CVA tenderness. Cardio: regular rate and rhythm, S1, S2 normal, no murmur, click, rub or gallop GI: soft, non-tender; bowel sounds normal; no masses,  no organomegaly Extremities: extremities normal, atraumatic, no cyanosis or edema  ECOG PERFORMANCE STATUS: 1 - Symptomatic but completely ambulatory  Blood pressure (!) 154/70, pulse 85, temperature 97.9 F (36.6 C), temperature source Oral, resp. rate 18, weight 217 lb 12.8 oz (98.8 kg), SpO2 90 %.  LABORATORY DATA: Lab Results  Component Value Date   WBC 7.2 01/04/2023   HGB 12.8 (L) 01/04/2023   HCT 38.0 (L) 01/04/2023   MCV 108.6 (H) 01/04/2023   PLT 195 01/04/2023      Chemistry      Component Value Date/Time   NA 128 (L) 08/18/2021 1359   NA 134 10/13/2020 0741   K 4.2 08/18/2021 1359   CL 92 (L) 08/18/2021 1359   CO2 26 08/18/2021 1359   BUN 13 08/18/2021 1359   BUN 11 10/13/2020 0741   CREATININE 0.95 08/18/2021 1359   CREATININE 0.87 04/05/2021 0903      Component Value Date/Time   CALCIUM 9.4 08/18/2021 1359   ALKPHOS 76 01/04/2021 1638   AST 29 01/04/2021 1638   ALT 29 01/04/2021 1638   BILITOT 0.8 01/04/2021 1638       RADIOGRAPHIC STUDIES: CT ANGIO AO+BIFEM W & OR WO CONTRAST  Result Date: 12/20/2022 CLINICAL DATA:  Peripheral vascular disease EXAM: CT ANGIOGRAPHY OF ABDOMINAL AORTA WITH ILIOFEMORAL RUNOFF TECHNIQUE: Multidetector CT imaging of the abdomen, pelvis and lower extremities was performed using the standard protocol during bolus administration of intravenous contrast. Multiplanar CT image reconstructions and MIPs were obtained to evaluate the vascular anatomy. RADIATION DOSE REDUCTION: This exam was performed according to the departmental dose-optimization program which includes automated exposure control, adjustment of the mA and/or kV according to patient size and/or  use of iterative reconstruction technique. CONTRAST:  OMNIPAQUE IOHEXOL 350 MG/ML SOLN COMPARISON:  CT scan of the abdomen  and pelvis 10/10/2022 FINDINGS: VASCULAR Aorta: Scattered calcifications throughout the abdominal aorta without evidence of aneurysm or dissection. Celiac: Bulky calcified atherosclerotic plaque at the origin of the celiac artery results in high-grade stenosis versus focal occlusion. No evidence of aneurysm or dissection. SMA: Bulky calcification at the origin of the SMA results in at least moderate stenosis. Renals: Solitary renal arteries bilaterally. Calcified plaque at the origin of the right renal artery without stenosis. Calcified plaque at the origin the left renal artery results in mild to moderate stenosis. IMA: Patent without evidence of aneurysm, dissection, vasculitis or significant stenosis. RIGHT Lower Extremity Inflow: Proximal common iliac artery is ectatic measuring up to 1.8 cm. Scattered calcifications without significant stenosis. No dissection. Bulky calcification results in significant stenosis at the origin of the hypogastric artery. The external iliac artery is heavily calcified with probable high-grade stenosis proximally. The vessel remains patent. Outflow: Common femoral artery is widely patent. Surgical changes of prior common femoral endarterectomy and right to left fem-fem bypass graft. The bypass graft is widely patent. No evidence of abnormality at the anastomoses. The profunda femoral artery is widely patent. The superficial femoral artery is diseased with scattered calcified atherosclerotic plaque resulting in areas of mild stenosis in till the abductor canal where there is a focal high-grade stenosis versus short segment occlusion. The artery then reconstitutes. Heavily calcified plaque present in the P1 segment of the popliteal artery. The P2 and P3 segments are relatively spared. Runoff: There appears to be patent 3 vessel runoff to the ankle. 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 The origin of the hypogastric artery is also chronically occluded. Outflow: Widely patent right to left fem-fem bypass graft. The profunda femoral and superficial femoral arteries are patent. Scattered plaque throughout the SFA resulting in high-grade stenosis in Hunter's canal. Heavily calcified plaque also extends into the popliteal artery but there does not appear to be significant stenosis. Runoff: Patent 3 vessel runoff to the ankle. Veins: No focal venous abnormality. Review of the MIP images confirms the above findings. NON-VASCULAR Lower chest: No acute abnormality. Calcifications present along the coronary arteries. Hepatobiliary: No focal liver abnormality is seen. No gallstones, gallbladder wall thickening, or biliary dilatation. Pancreas: Unremarkable. No pancreatic ductal dilatation or surrounding inflammatory changes. Spleen: Normal in size without focal abnormality. Adrenals/Urinary Tract: Adrenal glands are unremarkable. Kidneys are normal, without renal calculi, focal lesion, or hydronephrosis. Bladder is unremarkable. Stomach/Bowel: Stomach is within normal limits. Appendix appears normal. No evidence of bowel wall thickening, distention, or inflammatory changes. Lymphatic: No suspicious lymphadenopathy. Reproductive: Prostate is unremarkable. Other: No abdominal wall hernia or abnormality. No abdominopelvic ascites. Musculoskeletal: No acute fracture or aggressive appearing lytic or blastic osseous lesion. Multilevel degenerative disc disease in the lumbar spine. IMPRESSION: VASCULAR 1. 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 2. Widely patent right to left fem-fem bypass graft without evidence of complication. 3. 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 4. Focal moderate stenosis in the right external iliac artery secondary to bulky calcified  atherosclerotic plaque. 5. Extensive calcified plaque throughout the bilateral superficial femoral arteries with probable moderate stenosis in the bilateral adductor canals. Additionally, calcified plaque results in stenosis in the P1 segment of the right popliteal artery. 6. Runoff arteries appear patent bilaterally. 7.  Aortic Atherosclerosis (ICD10-I70.0). 8. Coronary artery atherosclerotic vascular calcifications. NON-VASCULAR 1. No acute abnormality within the abdomen or pelvis. 2. Multilevel degenerative disc disease in  the lumbar spine. Signed, Sterling Big, MD, RPVI [facility] Electronically Signed   By: Malachy Moan M.D.   On: 12/20/2022 14:53    ASSESSMENT AND PLAN: This is a very pleasant 72 years old white male with history of iron deficiency anemia secondary to gastrointestinal hemorrhage with positive stool for Hemoccult.  The patient also has M spike on the recent blood work. He was treated with Venofer 300 Mg IV weekly for 3 weeks and tolerated his treatment well. The patient is currently on oral iron tablet and tolerating it fairly well. He had repeat CBC that showed mild anemia with hemoglobin of 12.8 and hematocrit 38.0%.  Iron study as well as myeloma panel are still pending. I recommended for the patient to continue his current treatment with the oral iron tablets for now. I will see him back for follow-up visit in 6 months for evaluation with repeat blood work. He was advised to call immediately if he has any other concerning symptoms in the interval. The patient voices understanding of current disease status and treatment options and is in agreement with the current care plan.  All questions were answered. The patient knows to call the clinic with any problems, questions or concerns. We can certainly see the patient much sooner if necessary.  The total time spent in the appointment was 20  minutes.  Disclaimer: This note was dictated with voice recognition software. Similar sounding words can inadvertently be transcribed and may not be corrected upon review.

## 2023-01-05 LAB — IGG, IGA, IGM
IgA: 313 mg/dL (ref 61–437)
IgG (Immunoglobin G), Serum: 1049 mg/dL (ref 603–1613)
IgM (Immunoglobulin M), Srm: 156 mg/dL (ref 20–172)

## 2023-01-05 LAB — KAPPA/LAMBDA LIGHT CHAINS
Kappa free light chain: 22.5 mg/L — ABNORMAL HIGH (ref 3.3–19.4)
Kappa, lambda light chain ratio: 1.07 (ref 0.26–1.65)
Lambda free light chains: 21 mg/L (ref 5.7–26.3)

## 2023-01-05 LAB — BETA 2 MICROGLOBULIN, SERUM: Beta-2 Microglobulin: 1.5 mg/L (ref 0.6–2.4)

## 2023-03-22 IMAGING — CT CT ANGIO CHEST
2 of 6 series · 18 of 36 positions shown · IV contrast (omnipaque)
Comparison: None.

CLINICAL DATA: Pulmonary embolism (PE) suspected, positive D-dimer

EXAM:
CT ANGIOGRAPHY CHEST WITH CONTRAST
TECHNIQUE: Multidetector CT imaging of the chest was performed using the
standard protocol during bolus administration of intravenous
contrast. Multiplanar CT image reconstructions and MIPs were
obtained to evaluate the vascular anatomy.
CONTRAST:  100mL OMNIPAQUE IOHEXOL 350 MG/ML SOLN

[Series 8: pe thins · axial · 0.89mm/px · z∈[-46,+227]mm · 17 of 206 slices shown]
[im 12/206  lung]
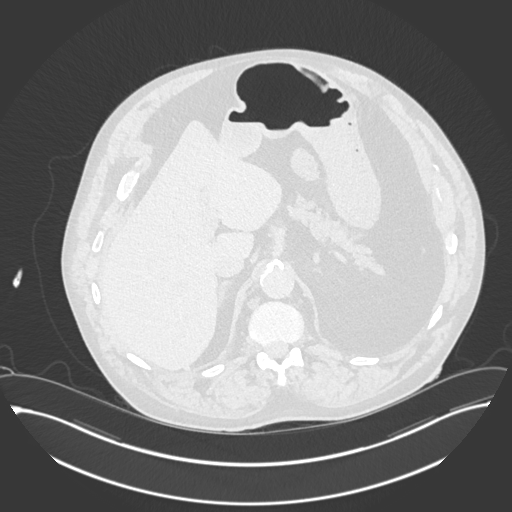
[im 23/206  mediastinal]
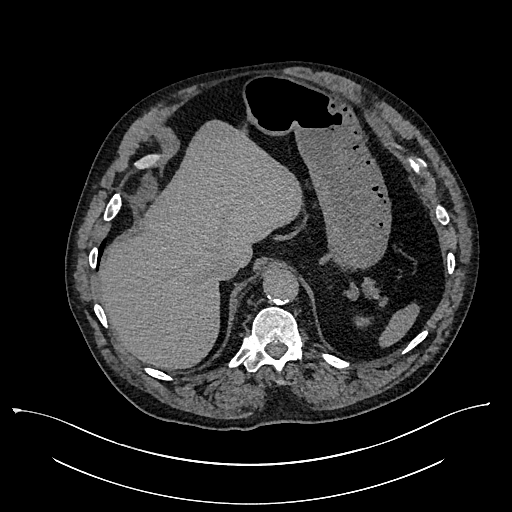
[im 35/206  lung]
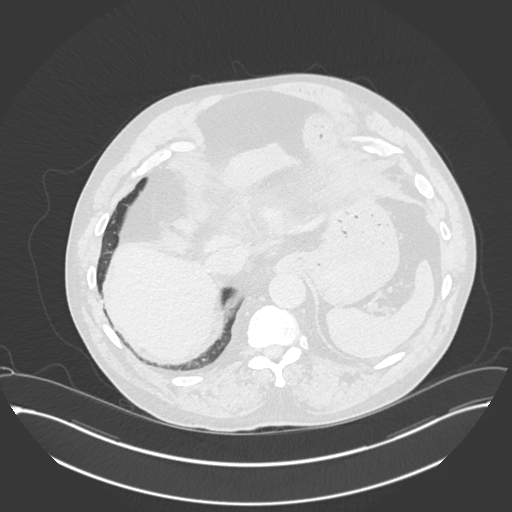
[im 46/206  mediastinal]
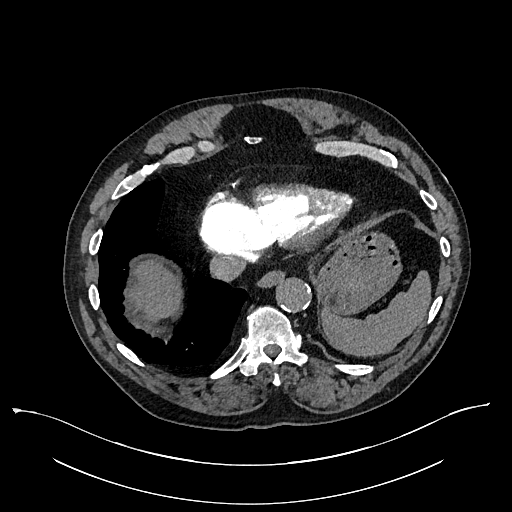
[im 57/206  lung]
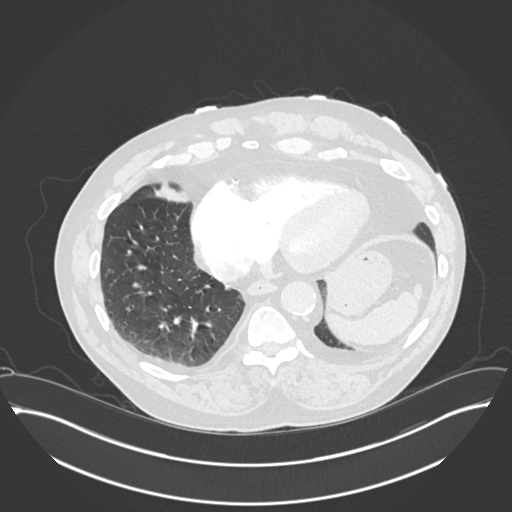
[im 69/206  mediastinal]
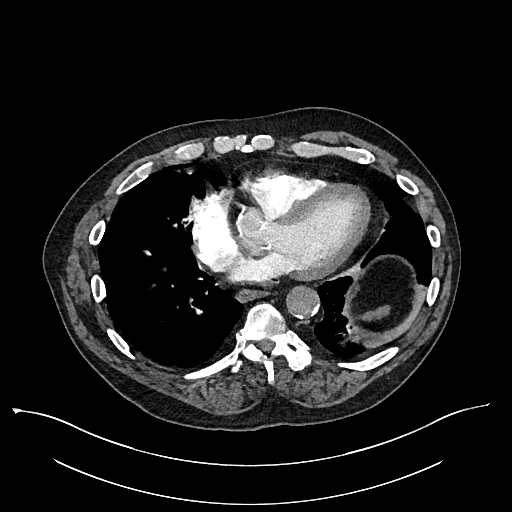
[im 80/206  lung]
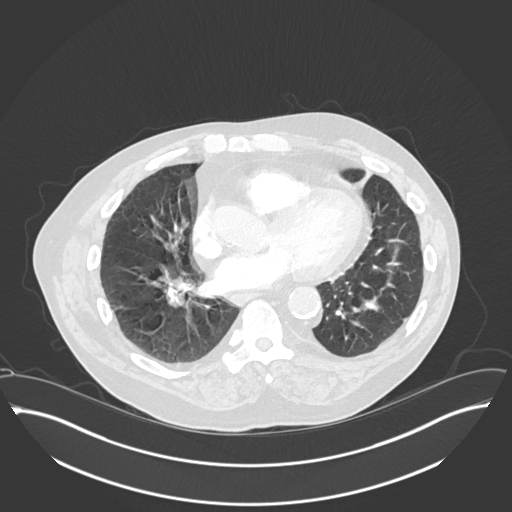
[im 92/206  mediastinal]
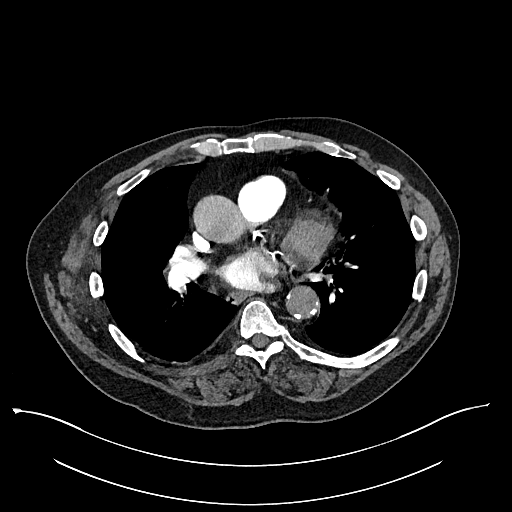
[im 103/206  lung]
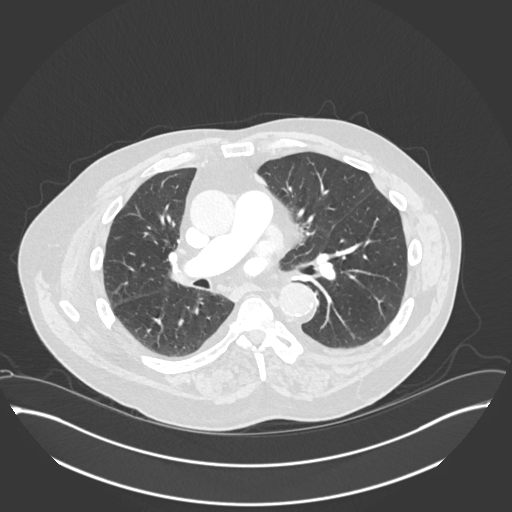
[im 114/206  mediastinal]
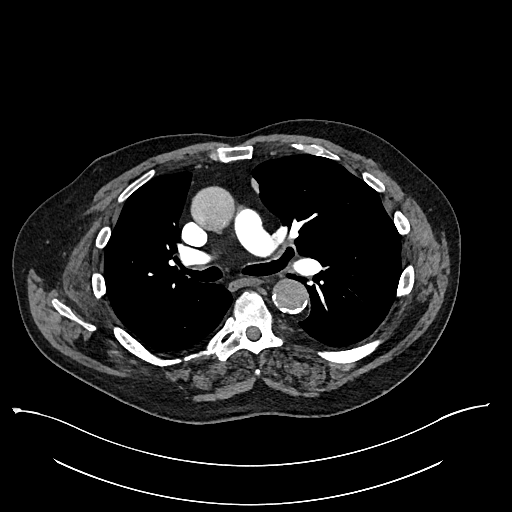
[im 126/206  lung]
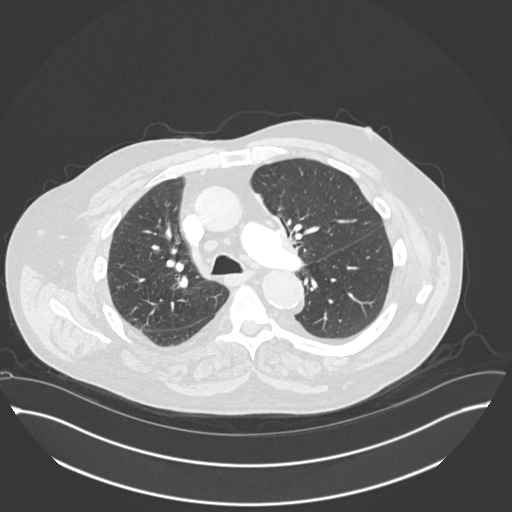
[im 137/206  mediastinal]
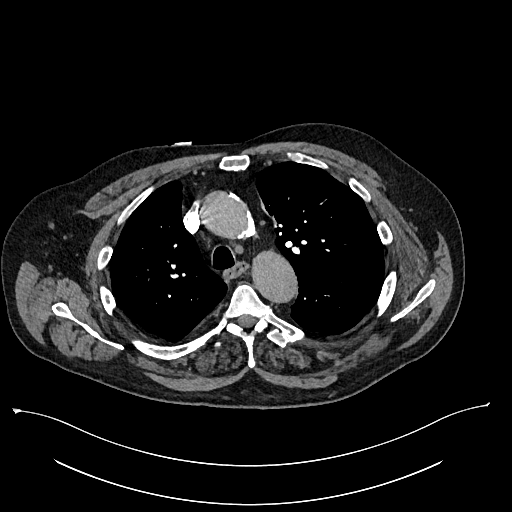
[im 149/206  lung]
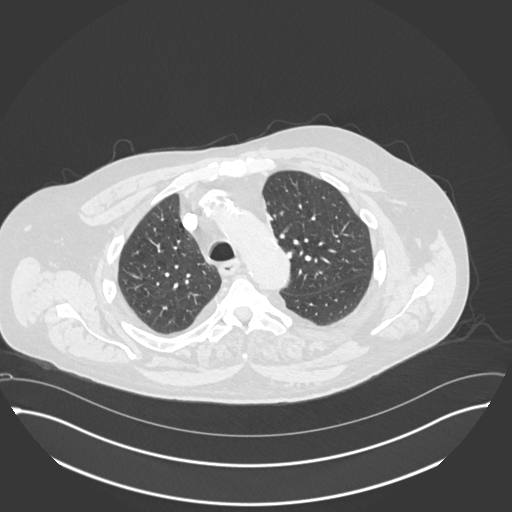
[im 160/206  mediastinal]
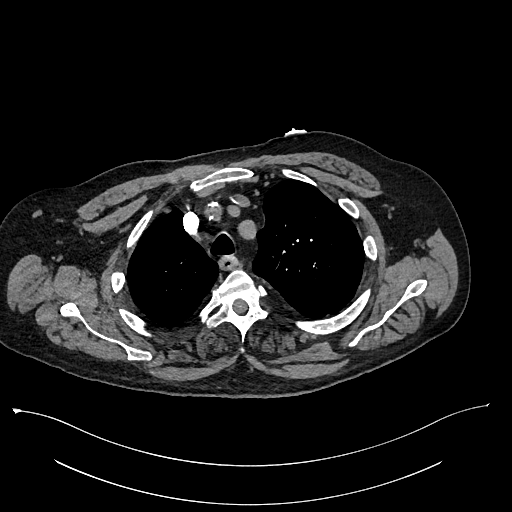
[im 171/206  lung]
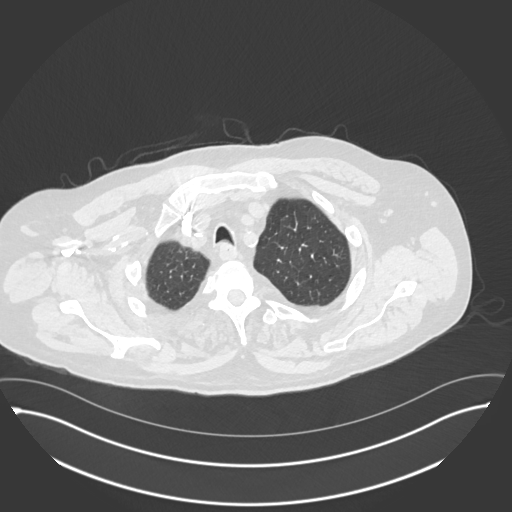
[im 183/206  mediastinal]
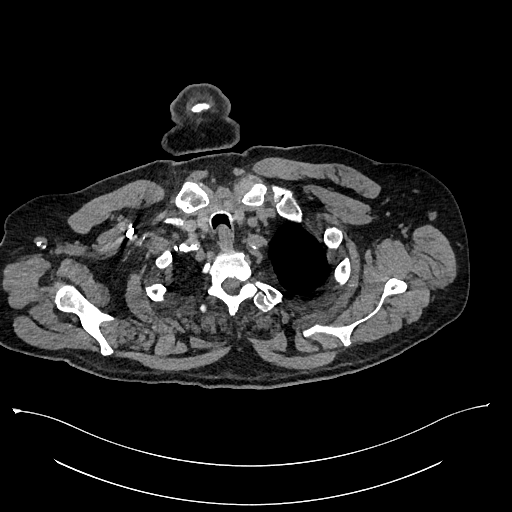
[im 194/206  lung]
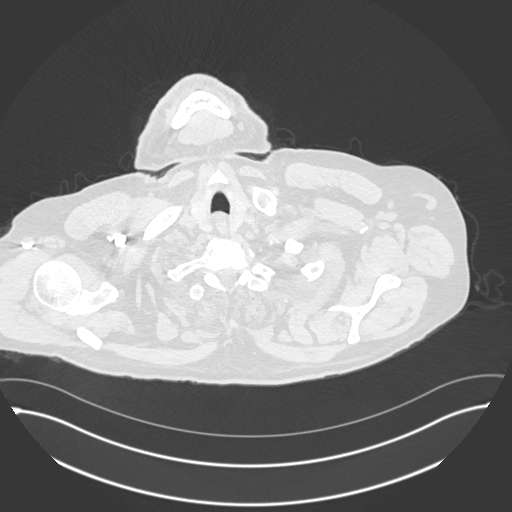

[Series 9: pe 2mm cor · coronal · 0.60mm/px · 1 of 125 slices shown]
[im 63/125  mediastinal]
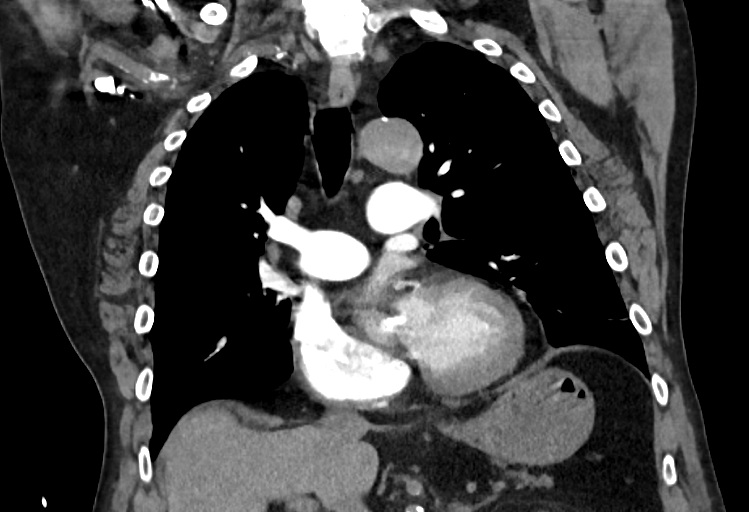

[18 of 36 positions shown; findings below may reference images not displayed]

FINDINGS: Cardiovascular: Adequate opacification of the pulmonary arterial
tree. No intraluminal filling defect identified to suggest acute
pulmonary embolism. Central pulmonary arteries are of normal
caliber. Extensive multi-vessel coronary artery calcification. Mild
calcification of the aortic valve leaflets. Cardiac size within
normal limits. No pericardial effusion. Moderate atherosclerotic
calcification within the thoracic aorta. No aortic aneurysm.

Mediastinum/Nodes: No enlarged mediastinal, hilar, or axillary lymph
nodes. Thyroid gland, trachea, and esophagus demonstrate no
significant findings.

Lungs/Pleura: 7 mm noncalcified pulmonary nodule is seen within the
right lower lobe, axial image # 77/7, indeterminate. The lungs are
otherwise clear. No pneumothorax or pleural effusion. Central
airways are widely patent.

Upper Abdomen: No acute abnormality.

Musculoskeletal: No chest wall abnormality. No acute or significant
osseous findings.

Review of the MIP images confirms the above findings.
IMPRESSION: No pulmonary embolism.

Extensive multi-vessel coronary artery calcification.

Mild calcification of the aortic valve leaflets. Echocardiography
may be helpful to assess the degree of valvular dysfunction.

7 mm right solid pulmonary nodule. Recommend a non-contrast Chest CT
at 6-12 months. If patient is high risk for malignancy, recommend an
additional non-contrast Chest CT at 18-24 months; if patient is low
risk for malignancy a non-contrast Chest CT at 18-24 months is
optional.

These guidelines do not apply to immunocompromised patients and
patients with cancer. Follow up in patients with significant
comorbidities as clinically warranted. For lung cancer screening,
adhere to Lung-RADS guidelines. Reference: Radiology. 4336;

Aortic Atherosclerosis ([id number].K).

## 2023-04-19 ENCOUNTER — Ambulatory Visit (HOSPITAL_COMMUNITY)
Admission: RE | Admit: 2023-04-19 | Discharge: 2023-04-19 | Disposition: A | Discharge status: Home / Self Care | Payer: No Typology Code available for payment source | Source: Ambulatory Visit | Attending: Vascular Surgery | Admitting: Vascular Surgery

## 2023-04-19 ENCOUNTER — Ambulatory Visit (INDEPENDENT_AMBULATORY_CARE_PROVIDER_SITE_OTHER)
Admission: RE | Admit: 2023-04-19 | Discharge: 2023-04-19 | Disposition: A | Discharge status: Home / Self Care | Payer: No Typology Code available for payment source | Source: Ambulatory Visit | Attending: Vascular Surgery

## 2023-04-19 ENCOUNTER — Ambulatory Visit (INDEPENDENT_AMBULATORY_CARE_PROVIDER_SITE_OTHER): Payer: No Typology Code available for payment source | Admitting: Physician Assistant

## 2023-04-19 VITALS — BP 145/90 | HR 98 | Temp 98.1°F | Resp 18 | Ht 66.0 in | Wt 210.7 lb

## 2023-04-19 DIAGNOSIS — Z95828 Presence of other vascular implants and grafts: Secondary | ICD-10-CM

## 2023-04-19 DIAGNOSIS — I739 Peripheral vascular disease, unspecified: Secondary | ICD-10-CM

## 2023-04-19 DIAGNOSIS — I10 Essential (primary) hypertension: Secondary | ICD-10-CM | POA: Diagnosis not present

## 2023-04-19 DIAGNOSIS — Z87891 Personal history of nicotine dependence: Secondary | ICD-10-CM | POA: Diagnosis not present

## 2023-04-19 LAB — VAS US ABI WITH/WO TBI
Left ABI: 0.57
Right ABI: 0.49

## 2023-04-19 NOTE — Progress Notes (Signed)
[facility] [place] HISTORY AND PHYSICAL   History of Present Illness:  Patient is a 72 y.o. year old male who presents for evaluation of PAD.  He is s/p bilateral common femoral endarterectomy with right to left femorofemoral bypass for occluded left common external iliac artery that could not be crossed even after endarterectomy on the left.  He subsequently had right sartorius muscle flap placed.     Recently he followed up and was found to have decreased velocities in his femorofemoral bypass and ABIs have dropped as well.  He is not symptomatic from this and he is on 2.5 L of oxygen and rest states that he is unable to lie flat.    He states he really hasn't had much change in his claudication symptoms.  He is limited by his COPD more than the PAD.  He does ware mild compression socks daily.  He walks with a rolling O2 tank for support or straight cane.        Past Medical History:  Diagnosis Date   Alcohol abuse    Alcoholic hepatitis without ascites    Arthritis    Cataracts, bilateral    Colon polyps    COPD (chronic obstructive pulmonary disease) (HCC)    Fatty liver    GERD (gastroesophageal reflux disease)    Hyperlipidemia    Hypertension    Pneumonia    PTSD (post-traumatic stress disorder)    Skin carcinoma    Sleep apnea    Umbilical hernia    Vitamin D deficiency     Past Surgical History:  Procedure Laterality Date   ABDOMINAL AORTOGRAM W/LOWER EXTREMITY Bilateral 09/28/2020   Procedure: ABDOMINAL AORTOGRAM W/LOWER EXTREMITY;  Surgeon: Maeola Harman, MD;  Location: [facility] INVASIVE CV LAB;  Service: Cardiovascular;  Laterality: Bilateral;   APPLICATION OF WOUND VAC Right 01/12/2021   Procedure: APPLICATION OF WOUND VAC;  Surgeon: Maeola Harman, MD;  Location: [facility] OR;  Service: Vascular;  Laterality: Right;   BACK SURGERY     ENDARTERECTOMY FEMORAL Left 11/04/2020   Procedure: LEFT COMMON FEMORAL ARTERY ENDARTERECTOMY;   Surgeon: Maeola Harman, MD;  Location: [facility] OR;  Service: Vascular;  Laterality: Left;   EYE SURGERY     FEMORAL-FEMORAL BYPASS GRAFT Bilateral 11/04/2020   Procedure: RIGHT TO LEFT FEMORAL-FEMORAL BYPASS;  Surgeon: Maeola Harman, MD;  Location: [facility] OR;  Service: Vascular;  Laterality: Bilateral;   HERNIA REPAIR     INCISION AND DRAINAGE OF WOUND Right 01/06/2021   Procedure: IRRIGATION AND DEBRIDEMENT GROIN;  Surgeon: Nada Libman, MD;  Location: [facility] OR;  Service: Vascular;  Laterality: Right;   INCISION AND DRAINAGE OF WOUND Right 01/12/2021   Procedure: Drucie Ip [facility] OUT AND MUSCLE FLAP;  Surgeon: Maeola Harman, MD;  Location: [facility] OR;  Service: Vascular;  Laterality: Right;   PATCH ANGIOPLASTY Bilateral 11/04/2020   Procedure: PATCH ANGIOPLASTY LEFT AND RIGHT COMMON FEMORAL ARTERY;  Surgeon: Maeola Harman, MD;  Location: [facility] OR;  Service: Vascular;  Laterality: Bilateral;   ULTRASOUND GUIDANCE FOR VASCULAR ACCESS Bilateral 11/04/2020   Procedure: ULTRASOUND GUIDANCE FOR VASCULAR ACCESS;  Surgeon: Maeola Harman, MD;  Location: [facility] OR;  Service: Vascular;  Laterality: Bilateral;   WISDOM TOOTH EXTRACTION      ROS:   General:  No weight loss, Fever, chills  HEENT: No recent headaches, no nasal bleeding, no visual changes, no sore throat  Neurologic: No dizziness, blackouts, seizures. No recent symptoms of stroke or  mini- stroke. No recent episodes of slurred speech, or temporary blindness.  Cardiac: No recent episodes of chest pain/pressure, no shortness of breath at rest.  No shortness of breath with exertion.  Denies history of atrial fibrillation or irregular heartbeat  Vascular: No history of rest pain in feet.  No history of claudication.  No history of non-healing ulcer, No history of DVT   Pulmonary: No home oxygen, no productive cough, no hemoptysis,  No asthma or wheezing  Musculoskeletal:  [ ]  Arthritis, [ ]  Low back pain,  [ ]   Joint pain  Hematologic:No history of hypercoagulable state.  No history of easy bleeding.  No history of anemia  Gastrointestinal: No hematochezia or melena,  No gastroesophageal reflux, no trouble swallowing  Urinary: [ ]  chronic Kidney disease, [ ]  on HD - [ ]  MWF or [ ]  TTHS, [ ]  Burning with urination, [ ]  Frequent urination, [ ]  Difficulty urinating;   Skin: No rashes  Psychological: No history of anxiety,  No history of depression  Social History Social History   Tobacco Use   Smoking status: Former    Current packs/day: 0.00    Average packs/day: 3.0 packs/day for 45.0 years (135.0 ttl pk-yrs)    Types: Cigarettes    Start date: 06/25/1968    Quit date: 06/25/2013    Years since quitting: 9.8    Passive exposure: Never   Smokeless tobacco: Former    Quit date: 06/25/1976  Vaping Use   Vaping status: Never Used  Substance Use Topics   Alcohol use: Yes    Alcohol/week: 30.0 standard drinks of alcohol    Types: 30 Cans of beer per week    Comment: 4 per day   Drug use: No    Family History Family History  Problem Relation Age of Onset   COPD Mother    Diabetes Mother    Hypertension Father    COPD Father    Heart disease Father    Arthritis Sister    Colon cancer Neg Hx    Stomach cancer Neg Hx    Esophageal cancer Neg Hx     Allergies  Allergies  Allergen Reactions   Amlodipine Swelling and Other (See Comments)    Edema of lower extremities     Current Outpatient Medications  Medication Sig Dispense Refill   albuterol (PROVENTIL HFA;VENTOLIN HFA) 108 (90 BASE) MCG/ACT inhaler Inhale 2 puffs into the lungs every 6 (six) hours as needed for wheezing or shortness of breath.     albuterol (PROVENTIL) (2.5 MG/3ML) 0.083% nebulizer solution Take 2.5 mg by nebulization every 6 (six) hours as needed for wheezing or shortness of breath.     amLODipine (NORVASC) 2.5 MG tablet Take 2.5 mg by mouth daily.     amoxicillin (AMOXIL) 500 MG capsule Take 2  capsules (1,000 mg total) by mouth 2 (two) times daily. 120 capsule 1   aspirin EC 81 MG tablet Take 81 mg by mouth at bedtime. Swallow whole.     benazepril (LOTENSIN) 10 MG tablet Take 1 tablet (10 mg total) by mouth at bedtime. 90 tablet 3   Carboxymethylcellulose Sodium (THERATEARS PF OP) Place 2 drops into both eyes 3 (three) times daily.     cilostazol (PLETAL) 100 MG tablet Take 100 mg by mouth 2 (two) times daily.     ferrous sulfate 325 (65 FE) MG tablet Take 1 tablet by mouth daily.     folic acid (FOLVITE) 1 MG tablet Take 1 mg  by mouth daily.     Magnesium Oxide 420 MG TABS Take 420 mg by mouth every other day.     MENTHOL-METHYL SALICYLATE EX Apply 1 application topically 2 (two) times daily as needed (for knee pain).     metoprolol (TOPROL-XL) 200 MG 24 hr tablet Take 200 mg by mouth daily.     montelukast (SINGULAIR) 10 MG tablet Take 10 mg by mouth at bedtime.     Multiple Vitamin (MULTIVITAMIN) tablet Take 1 tablet by mouth every other day. Vitamins for eyes     niacin 100 MG tablet Take 100 mg by mouth in the morning.     NON FORMULARY      Omega-3 Fatty Acids (FISH OIL) 1000 MG CAPS Take 1,000 mg by mouth daily.     pantoprazole (PROTONIX) 40 MG tablet Take 40 mg by mouth 2 (two) times daily.     rosuvastatin (CRESTOR) 20 MG tablet Take 10 mg by mouth daily.     thiamine 100 MG tablet Take 100 mg by mouth daily.     Tiotropium Bromide-Olodaterol (STIOLTO RESPIMAT) 2.5-2.5 MCG/ACT AERS Inhale 2 each into the lungs daily.     cholecalciferol (VITAMIN D) 1000 UNITS tablet Take 1,000 Units by mouth 2 (two) times daily. (Patient not taking: Reported on 04/19/2023)     LACTOBACILLUS PO Take 2 capsules by mouth daily. (Patient not taking: Reported on 04/19/2023)     Current Facility-Administered Medications  Medication Dose Route Frequency Provider Last Rate Last Admin   0.9 %  sodium chloride infusion  250 mL Intravenous PRN Baglia, Corrina, PA-C       alum & mag hydroxide-simeth  (MAALOX/MYLANTA) 200-200-20 MG/5ML suspension 15-30 mL  15-30 mL Oral Q2H PRN Baglia, Corrina, PA-C       docusate sodium (COLACE) capsule 100 mg  100 mg Oral BID Baglia, Corrina, PA-C       guaiFENesin-dextromethorphan (ROBITUSSIN DM) 100-10 MG/5ML syrup 15 mL  15 mL Oral Q4H PRN Baglia, Corrina, PA-C       heparin injection 5,000 Units  5,000 Units Subcutaneous Q8H Baglia, Corrina, PA-C       hydrALAZINE (APRESOLINE) injection 5 mg  5 mg Intravenous Q20 Min PRN Baglia, Corrina, PA-C       labetalol (NORMODYNE) injection 10 mg  10 mg Intravenous Q10 min PRN Baglia, Corrina, PA-C       metoprolol tartrate (LOPRESSOR) injection 2-5 mg  2-5 mg Intravenous Q2H PRN Baglia, Corrina, PA-C       ondansetron (ZOFRAN) injection 4 mg  4 mg Intravenous Q6H PRN Baglia, Corrina, PA-C       phenol (CHLORASEPTIC) mouth spray 1 spray  1 spray Mouth/Throat PRN Baglia, Corrina, PA-C       senna-docusate (Senokot-S) tablet 1 tablet  1 tablet Oral QHS PRN Baglia, Corrina, PA-C       sodium chloride flush (NS) 0.9 % injection 3 mL  3 mL Intravenous Q12H Baglia, Corrina, PA-C       sodium chloride flush (NS) 0.9 % injection 3 mL  3 mL Intravenous PRN Baglia, Corrina, PA-C       Facility-Administered Medications Ordered in Other Visits  Medication Dose Route Frequency Provider Last Rate Last Admin   acetaminophen (TYLENOL) tablet 325-650 mg  325-650 mg Oral Q4H PRN Baglia, Corrina, PA-C       Or   acetaminophen (TYLENOL) suppository 320-640 mg  320-640 mg Rectal Q4H PRN Graceann Congress, PA-C        Physical Examination  Vitals:   04/19/23 1340  BP: (!) 145/90  Pulse: 98  Resp: 18  Temp: 98.1 F (36.7 C)  TempSrc: Temporal  SpO2: 93%  Weight: 210 lb 11.2 oz (95.6 kg)  Height: 5\' 6"  (1.676 m)    Body mass index is 34.01 kg/m.  General:  Alert and oriented, no acute distress HEENT: Normal Neck: No bruit or JVD Pulmonary: Clear to auscultation bilaterally Cardiac: Regular Rate and Rhythm without  murmur Abdomen: Soft, non-tender, non-distended, no mass, no scars Skin: No rash Extremity Pulses:  palpable fem-fem bypass Musculoskeletal: No deformity or edema  Neurologic: Upper and lower extremity motor intact and symmetric  DATA:  Fem Fem Graft: Right to Left  +------------------+--------+--------+----------+--------+                   PSV cm/sStenosisWaveform  Comments  +------------------+--------+--------+----------+--------+  Inflow           101             monophasic          +------------------+--------+--------+----------+--------+  Prox anastomosis  47              monophasic          +------------------+--------+--------+----------+--------+  Proximal graft    57              monophasic          +------------------+--------+--------+----------+--------+  Mid graft         30              monophasic          +------------------+--------+--------+----------+--------+  Distal graft      32              monophasic          +------------------+--------+--------+----------+--------+  Distal anastomosis58              monophasic          +------------------+--------+--------+----------+--------+  Outflow          52              monophasic          +------------------+--------+--------+----------+--------+    Summary:  Right: Patent right to left fem-fem artery bypass graft without evidence  of hemodynaimically significant stenosis.    ABI Findings:  +---------+------------------+-----+----------+--------+  Right   Rt Pressure (mmHg)IndexWaveform  Comment   +---------+------------------+-----+----------+--------+  Brachial 140                                        +---------+------------------+-----+----------+--------+  PTA     71                0.49 monophasic          +---------+------------------+-----+----------+--------+  DP      67                0.46 monophasic           +---------+------------------+-----+----------+--------+  Great Toe0                 0.00 Absent              +---------+------------------+-----+----------+--------+   +---------+------------------+-----+----------+-------+  Left    Lt Pressure (mmHg)IndexWaveform  Comment  +---------+------------------+-----+----------+-------+  Brachial 146                                       +---------+------------------+-----+----------+-------+  PTA     83                0.57 monophasic         +---------+------------------+-----+----------+-------+  DP      78                0.53 monophasic         +---------+------------------+-----+----------+-------+  Great Toe19                0.13 Abnormal           +---------+------------------+-----+----------+-------+   +-------+-----------+-----------+------------+------------+  ABI/TBIToday's ABIToday's TBIPrevious ABIPrevious TBI  +-------+-----------+-----------+------------+------------+  Right 0.49       0          0.63        0.31          +-------+-----------+-----------+------------+------------+  Left  0.57       0.13       0.66        0.48          +-------+-----------+-----------+------------+------------+        Arterial wall calcification precludes accurate ankle pressures and ABIs.  Bilateral ABIs and TBIs appear decreased.    Summary:  Right: Resting right ankle-brachial index indicates severe right lower  extremity arterial disease. The right toe-brachial index is abnormal.   Left: Resting left ankle-brachial index indicates moderate left lower  extremity arterial disease. The left toe-brachial index is abnormal. PPG  tracings appear dampened.      ASSESSMENT/PLAN:  PAD with history of Fem-fem right -left bypass due to occluded left Common and external iliac artery. The fem-fem bypass is patent without hemodynaimically significant stenosis.  The ABI's B are decreased  from previous study.  He is ambulatory independently and has no frank rest pain or new ulcer.    He is at high risk for any procedures and his inability to lay flat due to shortness of breath I would not recommend any procedures that are not absolutely necessary.  I discussed the importance of exercise such as seated movement and walking even if its short distances to maintain perfusion.  He will f/u with repeat studies in 3-4 months.  He will call with any new concerns.      Mosetta Pigeon PA-C [facility] [place] Office: [phone]  MD in clinic [place]

## 2023-05-02 ENCOUNTER — Other Ambulatory Visit: Payer: Self-pay

## 2023-05-02 DIAGNOSIS — I739 Peripheral vascular disease, unspecified: Secondary | ICD-10-CM

## 2023-05-30 NOTE — Addendum Note (Signed)
Addended by: Ihor Austin D on: 05/30/2023 02:24 PM   Modules accepted: Orders

## 2023-06-07 ENCOUNTER — Ambulatory Visit (INDEPENDENT_AMBULATORY_CARE_PROVIDER_SITE_OTHER): Payer: No Typology Code available for payment source | Admitting: Pulmonary Disease

## 2023-06-07 ENCOUNTER — Encounter: Payer: Self-pay | Admitting: Pulmonary Disease

## 2023-06-07 VITALS — BP 128/70 | HR 95 | Temp 97.9°F | Ht 66.0 in | Wt 210.4 lb

## 2023-06-07 DIAGNOSIS — J439 Emphysema, unspecified: Secondary | ICD-10-CM

## 2023-06-07 DIAGNOSIS — J9611 Chronic respiratory failure with hypoxia: Secondary | ICD-10-CM | POA: Diagnosis not present

## 2023-06-07 NOTE — Progress Notes (Signed)
Synopsis: Referred in November 2023 for COPD from the [place] system.  Formerly followed by Dr. Shelle Iron at the [facility] in [place]. Quit tobacco 2014 Complete pulmonary rehab 2017 Continuing in pulmonary rehab maintenance Completed pulmonary rehab at [facility] 2023.    Subjective:   PATIENT ID: Sean [name] GENDER: male DOB: July 20, 1951, MRN: [id number]   HPI  Chief Complaint  Patient presents with   Follow-up    Doing okay.  Would like to discuss getting a POC ([employer])   Since the last visit Alinda Money has been about the same.    He continues to use oxygen continuously.  He still using Stiolto and Asmanex.  No problems with bronchitis or pneumonia since the last visit.  Dyspnea has not worsened.  He denies exacerbations requiring prednisone or higher level of care, urgent visit, hospitalization etc.  He inquires about a portable oxygen concentrator.  She is never been evaluated for this.  States never been walk, has not represented the [place] but they have not been optimistic or encouraging in terms of obtaining 1.   Past Medical History:  Diagnosis Date   Alcohol abuse    Alcoholic hepatitis without ascites    Arthritis    Cataracts, bilateral    Colon polyps    COPD (chronic obstructive pulmonary disease) (HCC)    Fatty liver    GERD (gastroesophageal reflux disease)    Hyperlipidemia    Hypertension    Pneumonia    PTSD (post-traumatic stress disorder)    Skin carcinoma    Sleep apnea    Umbilical hernia    Vitamin D deficiency      Review of systems: N/a    Objective:  Physical Exam   Vitals:   06/07/23 0838  BP: 128/70  Pulse: 95  Temp: 97.9 F (36.6 C)  TempSrc: Oral  SpO2: 96%  Weight: 210 lb 6.4 oz (95.4 kg)  Height: 5\' 6"  (1.676 m)     2.5L   Gen: chronically ill appearing HENT: OP clear, neck supple PULM: CTA B, normal effort, distant CV: RRR, no mgr GI: BS+, soft, nontender Derm: no cyanosis or rash Psyche: normal mood and affect    CBC     Component Value Date/Time   WBC 7.2 01/04/2023 0953   WBC 7.8 08/18/2021 1359   RBC 3.50 (L) 01/04/2023 0953   HGB 12.8 (L) 01/04/2023 0953   HCT 38.0 (L) 01/04/2023 0953   PLT 195 01/04/2023 0953   MCV 108.6 (H) 01/04/2023 0953   MCH 36.6 (H) 01/04/2023 0953   MCHC 33.7 01/04/2023 0953   RDW 14.6 01/04/2023 0953   LYMPHSABS 2.0 01/04/2023 0953   MONOABS 0.6 01/04/2023 0953   EOSABS 0.1 01/04/2023 0953   BASOSABS 0.1 01/04/2023 0953     Chest imaging: Personally reviewed and discussed below CT chest 2014: no acute process, extensive atherosclerosis CXR 06/2014: no acute process July 2023 CT chest showed no suspicious pulmonary nodules or masses, no right lower lobe pulmonary nodule seen limited by respiratory motion artifact, marked aortic coronary atherosclerotic calcification  PFT: Personally reviewed and discussed below PFT's 2009: FEV1 2.13 (68%), ratio 62 PFT's 01/2014: FEV1 1.64 (57%), ratio 55 PFT 09/2020: FEV1 1.21 (44%)>> 1.40 (51%), 16% BD, ratio 53, TLC 91%, DLCO 51% PFT 07/2022: FEV1 1.25 L 44% predicted, ratio 72%, TLC 6.31 L 101% predicted, DLCO 11.23 48% predicted; flow-volume loop is consistent with small airways obstruction  Labs: October 2023 CBC hemoglobin 10.1, white blood cell count 5.7, no differential reported  Path:  Echo: Echo 10/2020: nl EF, LVH/DD, RV nl, no pulm htn 09-08-21 Mild Concentric LVH, LVSF is normal. EF >= 55% No regional wall motion  abnormalities Mild Valvular aortic stenosis Trace MR and Trace TR RVSP is estimated at  35mm Hg Ascending Aorta 4.0cm Mildly dilated ascending aorta.  Heart Catheterization:   Nuclear stress 05/2015: low risk Nuclear stress testing 10/2020: no ischemia, low risk study  Ambulatory oximetry: Ambulatory Ox 05/2015: desat to 90% at 813ft brisk walking. Ambulatory Ox 08/2020: 90% at 539ft       Assessment & Plan:   Pulmonary emphysema, unspecified emphysema type (HCC)  Chronic respiratory failure with  hypoxia (HCC)   Severe COPD: FEV1 44% predicted/2023 with fixed obstruction.  Stable without exacerbations on triple inhaled therapy.  Continue Stiolto 2 puffs daily, Continue Asmanex. Use albuterol as needed.  Chronic respiratory failure with hypoxemia: Using 2-3 L of oxygen continuously. Attempt for POC, walk today.  Scarring in the bases of your lungs, postinflammatory: Low suspicion for progressive ILD.  Can repeat images as needed based on worsening symptoms.   Return to clinic in 6 months or sooner as needed with Dr. Judeth Horn  I spent 41 minutes in the care of the patient including review of records, face-to-face visit, coordination of care.  Immunizations: Immunization History  Administered Date(s) Administered   Fluad Quad(high Dose 65+) 06/22/2020, 09/27/2021   H1N1 08/15/2008   Hepatitis A, Adult 06/21/1997, 02/22/1998   Influenza Whole 06/21/1998, 08/22/1999, 07/10/2000, 06/28/2001, 06/16/2002, 07/28/2005, 06/09/2006   Influenza,inj,Quad PF,6+ Mos 06/01/2016, 06/12/2017, 05/23/2018   Influenza-Unspecified 08/28/2003, 06/19/2004, 06/09/2006, 07/16/2007, 05/31/2008, 08/15/2008, 09/20/2008, 07/14/2009, 08/13/2010, 07/23/2012, 06/11/2013, 07/08/2014, 05/18/2015, 05/23/2019, 10/16/2021, 05/25/2022, 05/25/2022   Meningococcal polysaccharide vaccine (MPSV4) 11/13/1993, 07/10/2000   Moderna Covid-19 Vaccine Bivalent Booster 57yrs & up 05/05/2021   Moderna Sars-Covid-2 Vaccination 09/25/2019, 10/23/2019, 05/12/2020, 12/02/2020   Novel Infuenza-h1n1-09 09/20/2008   OPV 08/16/1971   PNEUMOCOCCAL CONJUGATE-20 09/26/2022   PPD Test 06/21/1998, 08/22/1999, 02/06/2001, 01/16/2002   Pneumococcal Conjugate-13 11/22/2017   Pneumococcal Polysaccharide-23 08/06/2019   Pneumococcal-Unspecified 08/26/2009   Rsv, Bivalent, Protein Subunit Rsvpref,pf Verdis Frederickson) 12/06/2022   Td 11/13/1993, 01/20/2004   Td (Adult), 2 Lf Tetanus Toxid, Preservative Free 11/13/1993, 01/20/2004   Td (Adult),5 Lf  Tetanus Toxid, Preservative Free 10/14/2021   Tdap 02/24/2012   Typhoid Parenteral 06/21/2000   Typhoid Parenteral, AKD ([place] [employer]) 11/13/1993   Unspecified SARS-COV-2 Vaccination 05/25/2022   Varicella 02/03/2006   Yellow Fever 12/14/1994   Zoster Recombinant(Shingrix) 02/03/2020, 06/03/2020     Current Outpatient Medications:    albuterol (PROVENTIL HFA;VENTOLIN HFA) 108 (90 BASE) MCG/ACT inhaler, Inhale 2 puffs into the lungs every 6 (six) hours as needed for wheezing or shortness of breath., Disp: , Rfl:    albuterol (PROVENTIL) (2.5 MG/3ML) 0.083% nebulizer solution, Take 2.5 mg by nebulization every 6 (six) hours as needed for wheezing or shortness of breath., Disp: , Rfl:    amLODipine (NORVASC) 2.5 MG tablet, Take 2.5 mg by mouth daily., Disp: , Rfl:    amoxicillin (AMOXIL) 500 MG capsule, Take 2 capsules (1,000 mg total) by mouth 2 (two) times daily., Disp: 120 capsule, Rfl: 1   aspirin EC 81 MG tablet, Take 81 mg by mouth at bedtime. Swallow whole., Disp: , Rfl:    benazepril (LOTENSIN) 10 MG tablet, Take 1 tablet (10 mg total) by mouth at bedtime., Disp: 90 tablet, Rfl: 3   Carboxymethylcellulose Sodium (THERATEARS PF OP), Place 2 drops into both eyes 3 (three) times daily., Disp: , Rfl:  cilostazol (PLETAL) 100 MG tablet, Take 100 mg by mouth 2 (two) times daily., Disp: , Rfl:    ferrous sulfate 325 (65 FE) MG tablet, Take 1 tablet by mouth daily., Disp: , Rfl:    folic acid (FOLVITE) 1 MG tablet, Take 1 mg by mouth daily., Disp: , Rfl:    Magnesium Oxide 420 MG TABS, Take 420 mg by mouth every other day., Disp: , Rfl:    MENTHOL-METHYL SALICYLATE EX, Apply 1 application topically 2 (two) times daily as needed (for knee pain)., Disp: , Rfl:    metoprolol (TOPROL-XL) 200 MG 24 hr tablet, Take 200 mg by mouth daily., Disp: , Rfl:    montelukast (SINGULAIR) 10 MG tablet, Take 10 mg by mouth at bedtime., Disp: , Rfl:    Multiple Vitamin (MULTIVITAMIN) tablet, Take 1 tablet by  mouth every other day. Vitamins for eyes, Disp: , Rfl:    NON FORMULARY, , Disp: , Rfl:    pantoprazole (PROTONIX) 40 MG tablet, Take 40 mg by mouth 2 (two) times daily., Disp: , Rfl:    rosuvastatin (CRESTOR) 20 MG tablet, Take 10 mg by mouth daily., Disp: , Rfl:    thiamine 100 MG tablet, Take 100 mg by mouth daily., Disp: , Rfl:    Tiotropium Bromide-Olodaterol (STIOLTO RESPIMAT) 2.5-2.5 MCG/ACT AERS, Inhale 2 each into the lungs daily., Disp: , Rfl:   Current Facility-Administered Medications:    0.9 %  sodium chloride infusion, 250 mL, Intravenous, PRN, Baglia, Corrina, PA-C   alum & mag hydroxide-simeth (MAALOX/MYLANTA) 200-200-20 MG/5ML suspension 15-30 mL, 15-30 mL, Oral, Q2H PRN, Baglia, Corrina, PA-C   docusate sodium (COLACE) capsule 100 mg, 100 mg, Oral, BID, Baglia, Corrina, PA-C   guaiFENesin-dextromethorphan (ROBITUSSIN DM) 100-10 MG/5ML syrup 15 mL, 15 mL, Oral, Q4H PRN, Baglia, Corrina, PA-C   heparin injection 5,000 Units, 5,000 Units, Subcutaneous, Q8H, Baglia, Corrina, PA-C   hydrALAZINE (APRESOLINE) injection 5 mg, 5 mg, Intravenous, Q20 Min PRN, Baglia, Corrina, PA-C   labetalol (NORMODYNE) injection 10 mg, 10 mg, Intravenous, Q10 min PRN, Baglia, Corrina, PA-C   metoprolol tartrate (LOPRESSOR) injection 2-5 mg, 2-5 mg, Intravenous, Q2H PRN, Baglia, Corrina, PA-C   ondansetron (ZOFRAN) injection 4 mg, 4 mg, Intravenous, Q6H PRN, Baglia, Corrina, PA-C   phenol (CHLORASEPTIC) mouth spray 1 spray, 1 spray, Mouth/Throat, PRN, Baglia, Corrina, PA-C   senna-docusate (Senokot-S) tablet 1 tablet, 1 tablet, Oral, QHS PRN, Baglia, Corrina, PA-C   sodium chloride flush (NS) 0.9 % injection 3 mL, 3 mL, Intravenous, Q12H, Baglia, Corrina, PA-C   sodium chloride flush (NS) 0.9 % injection 3 mL, 3 mL, Intravenous, PRN, Baglia, Corrina, PA-C  Facility-Administered Medications Ordered in Other Visits:    acetaminophen (TYLENOL) tablet 325-650 mg, 325-650 mg, Oral, Q4H PRN **OR**  acetaminophen (TYLENOL) suppository 320-640 mg, 320-640 mg, Rectal, Q4H PRN, Baglia, Corrina, PA-C

## 2023-06-07 NOTE — Patient Instructions (Signed)
Nice to see you today  No changes to medicines  We will walk today and if you qualify for portable oxygen concentrator we can send a new order  Return to clinic in 6 months or sooner if needed with Dr. Judeth Horn

## 2023-06-07 NOTE — Addendum Note (Signed)
Addended byClyda Greener M on: 06/07/2023 11:49 AM   Modules accepted: Orders

## 2023-06-28 ENCOUNTER — Inpatient Hospital Stay: Payer: Medicare PPO | Attending: Internal Medicine

## 2023-06-28 DIAGNOSIS — D472 Monoclonal gammopathy: Secondary | ICD-10-CM | POA: Diagnosis not present

## 2023-06-28 DIAGNOSIS — R21 Rash and other nonspecific skin eruption: Secondary | ICD-10-CM | POA: Insufficient documentation

## 2023-06-28 DIAGNOSIS — R0609 Other forms of dyspnea: Secondary | ICD-10-CM | POA: Insufficient documentation

## 2023-06-28 DIAGNOSIS — D509 Iron deficiency anemia, unspecified: Secondary | ICD-10-CM | POA: Insufficient documentation

## 2023-06-28 DIAGNOSIS — F1010 Alcohol abuse, uncomplicated: Secondary | ICD-10-CM | POA: Insufficient documentation

## 2023-06-28 DIAGNOSIS — K922 Gastrointestinal hemorrhage, unspecified: Secondary | ICD-10-CM | POA: Insufficient documentation

## 2023-06-28 DIAGNOSIS — R195 Other fecal abnormalities: Secondary | ICD-10-CM | POA: Insufficient documentation

## 2023-06-28 DIAGNOSIS — D539 Nutritional anemia, unspecified: Secondary | ICD-10-CM

## 2023-06-28 LAB — CMP (CANCER CENTER ONLY)
ALT: 25 U/L (ref 0–44)
AST: 30 U/L (ref 15–41)
Albumin: 4.3 g/dL (ref 3.5–5.0)
Alkaline Phosphatase: 77 U/L (ref 38–126)
Anion gap: 7 (ref 5–15)
BUN: 13 mg/dL (ref 8–23)
CO2: 27 mmol/L (ref 22–32)
Calcium: 9.5 mg/dL (ref 8.9–10.3)
Chloride: 102 mmol/L (ref 98–111)
Creatinine: 0.92 mg/dL (ref 0.61–1.24)
GFR, Estimated: >60 mL/min (ref >60)
Glucose, Bld: 109 mg/dL — ABNORMAL HIGH (ref 70–99)
Potassium: 4.4 mmol/L (ref 3.5–5.1)
Sodium: 136 mmol/L (ref 135–145)
Total Bilirubin: 0.5 mg/dL (ref <1.2)
Total Protein: 7.4 g/dL (ref 6.5–8.1)

## 2023-06-28 LAB — CBC WITH DIFFERENTIAL (CANCER CENTER ONLY)
Abs Immature Granulocytes: 0.07 10*3/uL (ref 0.00–0.07)
Basophils Absolute: 0.1 10*3/uL (ref 0.0–0.1)
Basophils Relative: 1 %
Eosinophils Absolute: 0.1 10*3/uL (ref 0.0–0.5)
Eosinophils Relative: 1 %
HCT: 38 % — ABNORMAL LOW (ref 39.0–52.0)
Hemoglobin: 13 g/dL (ref 13.0–17.0)
Immature Granulocytes: 1 %
Lymphocytes Relative: 26 %
Lymphs Abs: 1.6 10*3/uL (ref 0.7–4.0)
MCH: 35.8 pg — ABNORMAL HIGH (ref 26.0–34.0)
MCHC: 34.2 g/dL (ref 30.0–36.0)
MCV: 104.7 fL — ABNORMAL HIGH (ref 80.0–100.0)
Monocytes Absolute: 0.5 10*3/uL (ref 0.1–1.0)
Monocytes Relative: 7 %
Neutro Abs: 3.9 10*3/uL (ref 1.7–7.7)
Neutrophils Relative %: 64 %
Platelet Count: 217 10*3/uL (ref 150–400)
RBC: 3.63 MIL/uL — ABNORMAL LOW (ref 4.22–5.81)
RDW: 14.6 % (ref 11.5–15.5)
WBC Count: 6.1 10*3/uL (ref 4.0–10.5)
nRBC: 0 % (ref 0.0–0.2)

## 2023-06-28 LAB — IRON AND IRON BINDING CAPACITY (CC-WL,HP ONLY)
Iron: 150 ug/dL (ref 45–182)
Saturation Ratios: 48 % — ABNORMAL HIGH (ref 17.9–39.5)
TIBC: 315 ug/dL (ref 250–450)
UIBC: 165 ug/dL (ref 117–376)

## 2023-06-28 LAB — LACTATE DEHYDROGENASE: LDH: 185 U/L (ref 98–192)

## 2023-06-28 LAB — FERRITIN: Ferritin: 14 ng/mL — ABNORMAL LOW (ref 24–336)

## 2023-06-29 LAB — KAPPA/LAMBDA LIGHT CHAINS
Kappa free light chain: 22.9 mg/L — ABNORMAL HIGH (ref 3.3–19.4)
Kappa, lambda light chain ratio: 1.26 (ref 0.26–1.65)
Lambda free light chains: 18.2 mg/L (ref 5.7–26.3)

## 2023-06-29 LAB — IGG, IGA, IGM
IgA: 341 mg/dL (ref 61–437)
IgG (Immunoglobin G), Serum: 996 mg/dL (ref 603–1613)
IgM (Immunoglobulin M), Srm: 144 mg/dL (ref 20–172)

## 2023-06-29 LAB — BETA 2 MICROGLOBULIN, SERUM: Beta-2 Microglobulin: 1.7 mg/L (ref 0.6–2.4)

## 2023-07-04 ENCOUNTER — Encounter: Payer: Self-pay | Admitting: Pulmonary Disease

## 2023-07-05 ENCOUNTER — Inpatient Hospital Stay (HOSPITAL_BASED_OUTPATIENT_CLINIC_OR_DEPARTMENT_OTHER): Payer: Medicare PPO | Admitting: Internal Medicine

## 2023-07-05 VITALS — BP 129/70 | HR 97 | Temp 98.2°F | Resp 16 | Ht 66.0 in | Wt 209.9 lb

## 2023-07-05 DIAGNOSIS — D509 Iron deficiency anemia, unspecified: Secondary | ICD-10-CM | POA: Diagnosis not present

## 2023-07-05 DIAGNOSIS — D539 Nutritional anemia, unspecified: Secondary | ICD-10-CM

## 2023-07-05 NOTE — Progress Notes (Signed)
[facility] [phone]   Fax:([phone]  OFFICE PROGRESS NOTE  [facility] [address] [place] [address]  DIAGNOSIS: iron deficiency anemia with unclear etiology but likely could be secondary to gastrointestinal blood loss and the patient with long history of alcohol abuse.   PRIOR THERAPY: Iron for lesion with Venofer 300 Mg IV weekly for 3 weeks.  CURRENT THERAPY: Over-the-counter oral iron tablets  INTERVAL HISTORY: Sean [name] 72 y.o. male returns to the clinic today for follow-up visit.Discussed the use of AI scribe software for clinical note transcription with the patient, who gave verbal consent to proceed.  History of Present Illness   The patient, a 72 year old with a history of iron deficiency anemia and MGUS, has been previously treated with Venofer iron infusions. He reports no new complaints since the last visit three months ago. He has noticed some spots on his skin, which he plans to have evaluated by a dermatologist. He has completed pulmonary rehabilitation in the past and continues to require home oxygen at 2.5 liters per minute. He has not reported any changes in fatigue or weakness.  The patient has been taking iron supplements, folic acid, thiamine, and multivitamins. Despite the ongoing iron supplementation, his ferritin levels remain on the lower side. However, his hemoglobin and hematocrit levels are within normal limits, indicating no severe anemia. The patient's condition appears stable with very mild iron deficiency.       MEDICAL HISTORY: Past Medical History:  Diagnosis Date   Alcohol abuse    Alcoholic hepatitis without ascites    Arthritis    Cataracts, bilateral    Colon polyps    COPD (chronic obstructive pulmonary disease) (HCC)    Fatty liver    GERD (gastroesophageal reflux disease)    Hyperlipidemia    Hypertension    Pneumonia    PTSD (post-traumatic stress disorder)    Skin carcinoma     Sleep apnea    Umbilical hernia    Vitamin D deficiency     ALLERGIES:  is allergic to amlodipine.  MEDICATIONS:  Current Outpatient Medications  Medication Sig Dispense Refill   albuterol (PROVENTIL HFA;VENTOLIN HFA) 108 (90 BASE) MCG/ACT inhaler Inhale 2 puffs into the lungs every 6 (six) hours as needed for wheezing or shortness of breath.     albuterol (PROVENTIL) (2.5 MG/3ML) 0.083% nebulizer solution Take 2.5 mg by nebulization every 6 (six) hours as needed for wheezing or shortness of breath.     amLODipine (NORVASC) 2.5 MG tablet Take 2.5 mg by mouth daily.     amoxicillin (AMOXIL) 500 MG capsule Take 2 capsules (1,000 mg total) by mouth 2 (two) times daily. 120 capsule 1   aspirin EC 81 MG tablet Take 81 mg by mouth at bedtime. Swallow whole.     benazepril (LOTENSIN) 10 MG tablet Take 1 tablet (10 mg total) by mouth at bedtime. 90 tablet 3   Carboxymethylcellulose Sodium (THERATEARS PF OP) Place 2 drops into both eyes 3 (three) times daily.     cilostazol (PLETAL) 100 MG tablet Take 100 mg by mouth 2 (two) times daily.     ferrous sulfate 325 (65 FE) MG tablet Take 1 tablet by mouth daily.     folic acid (FOLVITE) 1 MG tablet Take 1 mg by mouth daily.     Magnesium Oxide 420 MG TABS Take 420 mg by mouth every other day.     MENTHOL-METHYL SALICYLATE EX Apply 1 application topically  2 (two) times daily as needed (for knee pain).     metoprolol (TOPROL-XL) 200 MG 24 hr tablet Take 200 mg by mouth daily.     montelukast (SINGULAIR) 10 MG tablet Take 10 mg by mouth at bedtime.     Multiple Vitamin (MULTIVITAMIN) tablet Take 1 tablet by mouth every other day. Vitamins for eyes     NON FORMULARY      pantoprazole (PROTONIX) 40 MG tablet Take 40 mg by mouth 2 (two) times daily.     rosuvastatin (CRESTOR) 20 MG tablet Take 10 mg by mouth daily.     thiamine 100 MG tablet Take 100 mg by mouth daily.     Tiotropium Bromide-Olodaterol (STIOLTO RESPIMAT) 2.5-2.5 MCG/ACT AERS Inhale 2  each into the lungs daily.     Current Facility-Administered Medications  Medication Dose Route Frequency Provider Last Rate Last Admin   0.9 %  sodium chloride infusion  250 mL Intravenous PRN Baglia, Corrina, PA-C       alum & mag hydroxide-simeth (MAALOX/MYLANTA) 200-200-20 MG/5ML suspension 15-30 mL  15-30 mL Oral Q2H PRN Baglia, Corrina, PA-C       docusate sodium (COLACE) capsule 100 mg  100 mg Oral BID Baglia, Corrina, PA-C       guaiFENesin-dextromethorphan (ROBITUSSIN DM) 100-10 MG/5ML syrup 15 mL  15 mL Oral Q4H PRN Baglia, Corrina, PA-C       heparin injection 5,000 Units  5,000 Units Subcutaneous Q8H Baglia, Corrina, PA-C       hydrALAZINE (APRESOLINE) injection 5 mg  5 mg Intravenous Q20 Min PRN Baglia, Corrina, PA-C       labetalol (NORMODYNE) injection 10 mg  10 mg Intravenous Q10 min PRN Baglia, Corrina, PA-C       metoprolol tartrate (LOPRESSOR) injection 2-5 mg  2-5 mg Intravenous Q2H PRN Baglia, Corrina, PA-C       ondansetron (ZOFRAN) injection 4 mg  4 mg Intravenous Q6H PRN Baglia, Corrina, PA-C       phenol (CHLORASEPTIC) mouth spray 1 spray  1 spray Mouth/Throat PRN Baglia, Corrina, PA-C       senna-docusate (Senokot-S) tablet 1 tablet  1 tablet Oral QHS PRN Baglia, Corrina, PA-C       sodium chloride flush (NS) 0.9 % injection 3 mL  3 mL Intravenous Q12H Baglia, Corrina, PA-C       sodium chloride flush (NS) 0.9 % injection 3 mL  3 mL Intravenous PRN Baglia, Corrina, PA-C       Facility-Administered Medications Ordered in Other Visits  Medication Dose Route Frequency Provider Last Rate Last Admin   acetaminophen (TYLENOL) tablet 325-650 mg  325-650 mg Oral Q4H PRN Baglia, Corrina, PA-C       Or   acetaminophen (TYLENOL) suppository 320-640 mg  320-640 mg Rectal Q4H PRN Baglia, Corrina, PA-C        SURGICAL HISTORY:  Past Surgical History:  Procedure Laterality Date   ABDOMINAL AORTOGRAM W/LOWER EXTREMITY Bilateral 09/28/2020   Procedure: ABDOMINAL AORTOGRAM W/LOWER  EXTREMITY;  Surgeon: Maeola Harman, MD;  Location: [facility] INVASIVE CV LAB;  Service: Cardiovascular;  Laterality: Bilateral;   APPLICATION OF WOUND VAC Right 01/12/2021   Procedure: APPLICATION OF WOUND VAC;  Surgeon: Maeola Harman, MD;  Location: [facility] OR;  Service: Vascular;  Laterality: Right;   BACK SURGERY     ENDARTERECTOMY FEMORAL Left 11/04/2020   Procedure: LEFT COMMON FEMORAL ARTERY ENDARTERECTOMY;  Surgeon: Maeola Harman, MD;  Location: [facility] OR;  Service: Vascular;  Laterality:  Left;   EYE SURGERY     FEMORAL-FEMORAL BYPASS GRAFT Bilateral 11/04/2020   Procedure: RIGHT TO LEFT FEMORAL-FEMORAL BYPASS;  Surgeon: Maeola Harman, MD;  Location: [facility] OR;  Service: Vascular;  Laterality: Bilateral;   HERNIA REPAIR     INCISION AND DRAINAGE OF WOUND Right 01/06/2021   Procedure: IRRIGATION AND DEBRIDEMENT GROIN;  Surgeon: Nada Libman, MD;  Location: [facility] OR;  Service: Vascular;  Laterality: Right;   INCISION AND DRAINAGE OF WOUND Right 01/12/2021   Procedure: Drucie Ip [facility] OUT AND MUSCLE FLAP;  Surgeon: Maeola Harman, MD;  Location: [facility] OR;  Service: Vascular;  Laterality: Right;   PATCH ANGIOPLASTY Bilateral 11/04/2020   Procedure: PATCH ANGIOPLASTY LEFT AND RIGHT COMMON FEMORAL ARTERY;  Surgeon: Maeola Harman, MD;  Location: [facility] OR;  Service: Vascular;  Laterality: Bilateral;   ULTRASOUND GUIDANCE FOR VASCULAR ACCESS Bilateral 11/04/2020   Procedure: ULTRASOUND GUIDANCE FOR VASCULAR ACCESS;  Surgeon: Maeola Harman, MD;  Location: [facility] OR;  Service: Vascular;  Laterality: Bilateral;   WISDOM TOOTH EXTRACTION      REVIEW OF SYSTEMS:  A comprehensive review of systems was negative except for: Constitutional: positive for fatigue Respiratory: positive for dyspnea on exertion   PHYSICAL EXAMINATION: General appearance: alert, cooperative, fatigued, and no distress Head: Normocephalic, without obvious abnormality, atraumatic Neck:  no adenopathy, no JVD, supple, symmetrical, trachea midline, and thyroid not enlarged, symmetric, no tenderness/mass/nodules Lymph nodes: Cervical, supraclavicular, and axillary nodes normal. Resp: clear to auscultation bilaterally Back: symmetric, no curvature. ROM normal. No CVA tenderness. Cardio: regular rate and rhythm, S1, S2 normal, no murmur, click, rub or gallop GI: soft, non-tender; bowel sounds normal; no masses,  no organomegaly Extremities: extremities normal, atraumatic, no cyanosis or edema  ECOG PERFORMANCE STATUS: 1 - Symptomatic but completely ambulatory  Blood pressure 129/70, pulse 97, temperature 98.2 F (36.8 C), temperature source Temporal, resp. rate 16, height 5\' 6"  (1.676 m), weight 209 lb 14.4 oz (95.2 kg), SpO2 97%, peak flow (!) 3 L/min.  LABORATORY DATA: Lab Results  Component Value Date   WBC 6.1 06/28/2023   HGB 13.0 06/28/2023   HCT 38.0 (L) 06/28/2023   MCV 104.7 (H) 06/28/2023   PLT 217 06/28/2023      Chemistry      Component Value Date/Time   NA 136 06/28/2023 1005   NA 134 10/13/2020 0741   K 4.4 06/28/2023 1005   CL 102 06/28/2023 1005   CO2 27 06/28/2023 1005   BUN 13 06/28/2023 1005   BUN 11 10/13/2020 0741   CREATININE 0.92 06/28/2023 1005   CREATININE 0.87 04/05/2021 0903      Component Value Date/Time   CALCIUM 9.5 06/28/2023 1005   ALKPHOS 77 06/28/2023 1005   AST 30 06/28/2023 1005   ALT 25 06/28/2023 1005   BILITOT 0.5 06/28/2023 1005       RADIOGRAPHIC STUDIES: No results found.  ASSESSMENT AND PLAN: This is a very pleasant 72 years old white male with history of iron deficiency anemia secondary to gastrointestinal hemorrhage with positive stool for Hemoccult.  The patient also has M spike on the recent blood work. He was treated with Venofer 300 Mg IV weekly for 3 weeks and tolerated his treatment well.    Iron Deficiency Anemia Ferritin levels are low at 14 (previously 15 in May). Hemoglobin is normal at 13, and  hematocrit is 38. Iron studies show iron at 150 and saturation at 48%. Mild iron deficiency noted, but no severe anemia.  No new symptoms reported. - Continue ferrous sulfate once daily - Follow-up in three months  Monoclonal Gammopathy of Undetermined Significance (MGUS) No new symptoms such as increased fatigue or weakness. Monitoring free kappa light chain levels as part of ongoing surveillance. No immediate intervention required. - Continue regular blood work monitoring - Follow-up in three months  General Health Maintenance No new health maintenance issues discussed. Advised to consult dermatologist for skin spots. - Continue folic acid, thiamine, and multivitamins - Consult dermatologist for skin spots  Follow-up - Follow-up in three months.   The patient was advised to call immediately if she has any concerning symptoms in the interval. The patient voices understanding of current disease status and treatment options and is in agreement with the current care plan.  All questions were answered. The patient knows to call the clinic with any problems, questions or concerns. We can certainly see the patient much sooner if necessary.  The total time spent in the appointment was 20 minutes.  Disclaimer: This note was dictated with voice recognition software. Similar sounding words can inadvertently be transcribed and may not be corrected upon review.

## 2023-07-19 ENCOUNTER — Ambulatory Visit (HOSPITAL_COMMUNITY)
Admission: RE | Admit: 2023-07-19 | Discharge: 2023-07-19 | Disposition: A | Discharge status: Home / Self Care | Payer: No Typology Code available for payment source | Source: Ambulatory Visit | Attending: Vascular Surgery | Admitting: Vascular Surgery

## 2023-07-19 ENCOUNTER — Encounter: Payer: Self-pay | Admitting: Vascular Surgery

## 2023-07-19 ENCOUNTER — Ambulatory Visit (INDEPENDENT_AMBULATORY_CARE_PROVIDER_SITE_OTHER)
Admission: RE | Admit: 2023-07-19 | Discharge: 2023-07-19 | Disposition: A | Discharge status: Home / Self Care | Payer: No Typology Code available for payment source | Source: Ambulatory Visit | Attending: Vascular Surgery

## 2023-07-19 ENCOUNTER — Ambulatory Visit (INDEPENDENT_AMBULATORY_CARE_PROVIDER_SITE_OTHER): Payer: No Typology Code available for payment source | Admitting: Vascular Surgery

## 2023-07-19 VITALS — BP 166/83 | HR 107 | Resp 20 | Ht 66.0 in | Wt 209.0 lb

## 2023-07-19 DIAGNOSIS — I739 Peripheral vascular disease, unspecified: Secondary | ICD-10-CM | POA: Diagnosis present

## 2023-07-19 DIAGNOSIS — Z87891 Personal history of nicotine dependence: Secondary | ICD-10-CM | POA: Diagnosis not present

## 2023-07-19 DIAGNOSIS — E785 Hyperlipidemia, unspecified: Secondary | ICD-10-CM | POA: Insufficient documentation

## 2023-07-19 DIAGNOSIS — Z95828 Presence of other vascular implants and grafts: Secondary | ICD-10-CM | POA: Diagnosis not present

## 2023-07-19 DIAGNOSIS — I10 Essential (primary) hypertension: Secondary | ICD-10-CM | POA: Diagnosis not present

## 2023-07-19 LAB — VAS US ABI WITH/WO TBI
Left ABI: 0.61
Right ABI: 0.48

## 2023-07-19 NOTE — Progress Notes (Signed)
Patient ID: Sean [name], male   DOB: [birth year], 72 y.o.   MRN: [id number]  Reason for Consult: No chief complaint on file.   Referred by [facility]  Subjective:     HPI:  Sean [name] is a 72 y.o. male with previous history of short distance claudication and left lower extremity rest pain underwent bilateral common femoral endarterectomy with right to left femorofemoral bypass in 2022 complicated by right groin wound infection requiring sartorius muscle flap.  Patient now maintained on 2 and half liters of oxygen via nasal cannula but does walk without limitations other than claudication.  He is able to lay flat if he does so slowly but otherwise does have difficulty laying flat.  He does not have any rest pain or tissue loss in his legs at this time.  Past Medical History:  Diagnosis Date   Alcohol abuse    Alcoholic hepatitis without ascites    Arthritis    Cataracts, bilateral    Colon polyps    COPD (chronic obstructive pulmonary disease) (HCC)    Fatty liver    GERD (gastroesophageal reflux disease)    Hyperlipidemia    Hypertension    Pneumonia    PTSD (post-traumatic stress disorder)    Skin carcinoma    Sleep apnea    Umbilical hernia    Vitamin D deficiency    Family History  Problem Relation Age of Onset   COPD Mother    Diabetes Mother    Hypertension Father    COPD Father    Heart disease Father    Arthritis Sister    Colon cancer Neg Hx    Stomach cancer Neg Hx    Esophageal cancer Neg Hx    Past Surgical History:  Procedure Laterality Date   ABDOMINAL AORTOGRAM W/LOWER EXTREMITY Bilateral 09/28/2020   Procedure: ABDOMINAL AORTOGRAM W/LOWER EXTREMITY;  Surgeon: Maeola Harman, MD;  Location: [facility] INVASIVE CV LAB;  Service: Cardiovascular;  Laterality: Bilateral;   APPLICATION OF WOUND VAC Right 01/12/2021   Procedure: APPLICATION OF WOUND VAC;  Surgeon: Maeola Harman, MD;  Location: [facility] OR;  Service: Vascular;  Laterality: Right;    BACK SURGERY     ENDARTERECTOMY FEMORAL Left 11/04/2020   Procedure: LEFT COMMON FEMORAL ARTERY ENDARTERECTOMY;  Surgeon: Maeola Harman, MD;  Location: [facility] OR;  Service: Vascular;  Laterality: Left;   EYE SURGERY     FEMORAL-FEMORAL BYPASS GRAFT Bilateral 11/04/2020   Procedure: RIGHT TO LEFT FEMORAL-FEMORAL BYPASS;  Surgeon: Maeola Harman, MD;  Location: [facility] OR;  Service: Vascular;  Laterality: Bilateral;   HERNIA REPAIR     INCISION AND DRAINAGE OF WOUND Right 01/06/2021   Procedure: IRRIGATION AND DEBRIDEMENT GROIN;  Surgeon: Nada Libman, MD;  Location: [facility] OR;  Service: Vascular;  Laterality: Right;   INCISION AND DRAINAGE OF WOUND Right 01/12/2021   Procedure: Drucie Ip [facility] OUT AND MUSCLE FLAP;  Surgeon: Maeola Harman, MD;  Location: [facility] OR;  Service: Vascular;  Laterality: Right;   PATCH ANGIOPLASTY Bilateral 11/04/2020   Procedure: PATCH ANGIOPLASTY LEFT AND RIGHT COMMON FEMORAL ARTERY;  Surgeon: Maeola Harman, MD;  Location: [facility] OR;  Service: Vascular;  Laterality: Bilateral;   ULTRASOUND GUIDANCE FOR VASCULAR ACCESS Bilateral 11/04/2020   Procedure: ULTRASOUND GUIDANCE FOR VASCULAR ACCESS;  Surgeon: Maeola Harman, MD;  Location: [facility] OR;  Service: Vascular;  Laterality: Bilateral;   WISDOM TOOTH EXTRACTION      Short Social History:  Social History  Tobacco Use   Smoking status: Former    Current packs/day: 0.00    Average packs/day: 3.0 packs/day for 45.0 years (135.0 ttl pk-yrs)    Types: Cigarettes    Start date: 06/25/1968    Quit date: 06/25/2013    Years since quitting: 10.0    Passive exposure: Never   Smokeless tobacco: Former    Quit date: 06/25/1976  Substance Use Topics   Alcohol use: Yes    Alcohol/week: 30.0 standard drinks of alcohol    Types: 30 Cans of beer per week    Comment: 4 per day    Allergies  Allergen Reactions   Amlodipine Swelling and Other (See Comments)    Edema of lower extremities     Current Outpatient Medications  Medication Sig Dispense Refill   albuterol (PROVENTIL HFA;VENTOLIN HFA) 108 (90 BASE) MCG/ACT inhaler Inhale 2 puffs into the lungs every 6 (six) hours as needed for wheezing or shortness of breath.     albuterol (PROVENTIL) (2.5 MG/3ML) 0.083% nebulizer solution Take 2.5 mg by nebulization every 6 (six) hours as needed for wheezing or shortness of breath.     amLODipine (NORVASC) 2.5 MG tablet Take 2.5 mg by mouth daily.     amoxicillin (AMOXIL) 500 MG capsule Take 2 capsules (1,000 mg total) by mouth 2 (two) times daily. 120 capsule 1   aspirin EC 81 MG tablet Take 81 mg by mouth at bedtime. Swallow whole.     benazepril (LOTENSIN) 10 MG tablet Take 1 tablet (10 mg total) by mouth at bedtime. 90 tablet 3   Carboxymethylcellulose Sodium (THERATEARS PF OP) Place 2 drops into both eyes 3 (three) times daily.     cilostazol (PLETAL) 100 MG tablet Take 100 mg by mouth 2 (two) times daily.     ferrous sulfate 325 (65 FE) MG tablet Take 1 tablet by mouth daily.     folic acid (FOLVITE) 1 MG tablet Take 1 mg by mouth daily.     Magnesium Oxide 420 MG TABS Take 420 mg by mouth every other day.     MENTHOL-METHYL SALICYLATE EX Apply 1 application topically 2 (two) times daily as needed (for knee pain).     metoprolol (TOPROL-XL) 200 MG 24 hr tablet Take 200 mg by mouth daily.     montelukast (SINGULAIR) 10 MG tablet Take 10 mg by mouth at bedtime.     Multiple Vitamin (MULTIVITAMIN) tablet Take 1 tablet by mouth every other day. Vitamins for eyes     NON FORMULARY      pantoprazole (PROTONIX) 40 MG tablet Take 40 mg by mouth 2 (two) times daily.     rosuvastatin (CRESTOR) 20 MG tablet Take 10 mg by mouth daily.     thiamine 100 MG tablet Take 100 mg by mouth daily.     Tiotropium Bromide-Olodaterol (STIOLTO RESPIMAT) 2.5-2.5 MCG/ACT AERS Inhale 2 each into the lungs daily.     Current Facility-Administered Medications  Medication Dose Route Frequency Provider  Last Rate Last Admin   0.9 %  sodium chloride infusion  250 mL Intravenous PRN Baglia, Corrina, PA-C       alum & mag hydroxide-simeth (MAALOX/MYLANTA) 200-200-20 MG/5ML suspension 15-30 mL  15-30 mL Oral Q2H PRN Baglia, Corrina, PA-C       docusate sodium (COLACE) capsule 100 mg  100 mg Oral BID Baglia, Corrina, PA-C       guaiFENesin-dextromethorphan (ROBITUSSIN DM) 100-10 MG/5ML syrup 15 mL  15 mL Oral Q4H PRN Baglia, Corrina,  PA-C       heparin injection 5,000 Units  5,000 Units Subcutaneous Q8H Baglia, Corrina, PA-C       hydrALAZINE (APRESOLINE) injection 5 mg  5 mg Intravenous Q20 Min PRN Baglia, Corrina, PA-C       labetalol (NORMODYNE) injection 10 mg  10 mg Intravenous Q10 min PRN Baglia, Corrina, PA-C       metoprolol tartrate (LOPRESSOR) injection 2-5 mg  2-5 mg Intravenous Q2H PRN Baglia, Corrina, PA-C       ondansetron (ZOFRAN) injection 4 mg  4 mg Intravenous Q6H PRN Baglia, Corrina, PA-C       phenol (CHLORASEPTIC) mouth spray 1 spray  1 spray Mouth/Throat PRN Baglia, Corrina, PA-C       senna-docusate (Senokot-S) tablet 1 tablet  1 tablet Oral QHS PRN Baglia, Corrina, PA-C       sodium chloride flush (NS) 0.9 % injection 3 mL  3 mL Intravenous Q12H Baglia, Corrina, PA-C       sodium chloride flush (NS) 0.9 % injection 3 mL  3 mL Intravenous PRN Baglia, Corrina, PA-C       Facility-Administered Medications Ordered in Other Visits  Medication Dose Route Frequency Provider Last Rate Last Admin   acetaminophen (TYLENOL) tablet 325-650 mg  325-650 mg Oral Q4H PRN Baglia, Corrina, PA-C       Or   acetaminophen (TYLENOL) suppository 320-640 mg  320-640 mg Rectal Q4H PRN Baglia, Corrina, PA-C        Review of Systems  Constitutional:  Constitutional negative. HENT: HENT negative.  Eyes: Eyes negative.  Respiratory: Positive for shortness of breath.  Cardiovascular: Positive for claudication.  GI: Gastrointestinal negative.  Musculoskeletal: Musculoskeletal negative.  Skin: Skin  negative.  Neurological: Neurological negative. Hematologic: Hematologic/lymphatic negative.  Psychiatric: Psychiatric negative.        Objective:  Objective  Vitals:   07/19/23 1411  BP: (!) 166/83  Pulse: (!) 107  Resp: 20  SpO2: 95%     Physical Exam Constitutional:      Appearance: He is obese.  Eyes:     Pupils: Pupils are equal, round, and reactive to light.  Cardiovascular:     Rate and Rhythm: Normal rate.     Pulses:          Femoral pulses are 2+ on the right side and 2+ on the left side. Abdominal:     General: Abdomen is flat.  Skin:    Capillary Refill: Capillary refill takes less than 2 seconds.  Neurological:     General: No focal deficit present.  Psychiatric:        Mood and Affect: Mood normal.     Data: Fem Fem Graft: Right to Left  +------------------+--------+--------+----------+----------+                   PSV cm/sStenosisWaveform  Comments    +------------------+--------+--------+----------+----------+  Inflow           89              monophasic            +------------------+--------+--------+----------+----------+  Prox anastomosis  58              monophasic            +------------------+--------+--------+----------+----------+  Proximal graft                              image lost  +------------------+--------+--------+----------+----------+  Mid graft         29              monophasic            +------------------+--------+--------+----------+----------+  Distal graft      36              monophasic            +------------------+--------+--------+----------+----------+  Distal anastomosis64              monophasic            +------------------+--------+--------+----------+----------+  Outflow          49              monophasic            +------------------+--------+--------+----------+----------+     Summary:  Right: Patent right to left fem-fem bypass graft with no  stenosis.  Velocities < 40 cm/s in the mid and distal portions.   ABI Findings:  +---------+------------------+-----+----------+--------+  Right   Rt Pressure (mmHg)IndexWaveform  Comment   +---------+------------------+-----+----------+--------+  Brachial 159                                        +---------+------------------+-----+----------+--------+  ATA     77                0.48 monophasic          +---------+------------------+-----+----------+--------+  PTA     62                0.39 monophasic          +---------+------------------+-----+----------+--------+  Great Toe65                0.41                     +---------+------------------+-----+----------+--------+   +---------+------------------+-----+----------+-------+  Left    Lt Pressure (mmHg)IndexWaveform  Comment  +---------+------------------+-----+----------+-------+  Brachial 148                                       +---------+------------------+-----+----------+-------+  ATA     96                0.60 monophasic         +---------+------------------+-----+----------+-------+  PTA     97                0.61 monophasic         +---------+------------------+-----+----------+-------+  Great Toe81                0.51                    +---------+------------------+-----+----------+-------+   +-------+-----------+-----------+------------+------------+  ABI/TBIToday's ABIToday's TBIPrevious ABIPrevious TBI  +-------+-----------+-----------+------------+------------+  Right 0.48       0.41       0.49        0             +-------+-----------+-----------+------------+------------+  Left  0.61       0.51       0.57        0.13          +-------+-----------+-----------+------------+------------+        Previous ABI on 04/19/23.    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 The right toe-brachial index is abnormal.   Left: Resting left ankle-brachial index indicates moderate left lower  extremity arterial disease. The left toe-brachial index is abnormal.      Assessment/Plan:     72 year old male with a history of a right to left femorofemoral bypass with stable decreased ABIs and decreased flow through his femorofemoral although with palpable common femoral pulses.  Patient will be at risk for any intervention given need for continuous oxygen.  As such we will follow him yearly with duplex and ABIs.     Maeola Harman MD [facility]

## 2023-07-21 ENCOUNTER — Other Ambulatory Visit: Payer: Self-pay

## 2023-07-21 DIAGNOSIS — I739 Peripheral vascular disease, unspecified: Secondary | ICD-10-CM

## 2023-09-01 ENCOUNTER — Telehealth: Payer: Self-pay

## 2023-09-01 NOTE — Telephone Encounter (Signed)
Pt called with c/o L heel pain that he notices when he first wakes up and improves once he starts moving around. He denies any claudication, LLE swelling, rest pain, color change. He has been advised to continue to keep an eye on it and let us know if it changes/worsens or any other symptoms develop. I have advised him to reach out to his PCP for podiatry referral, as he does not have a podiatrist he regularly sees. Pt verbalized understanding of the above.

## 2023-09-22 ENCOUNTER — Telehealth: Payer: Self-pay | Admitting: Internal Medicine

## 2023-09-27 ENCOUNTER — Other Ambulatory Visit: Payer: No Typology Code available for payment source

## 2023-09-27 ENCOUNTER — Ambulatory Visit: Payer: No Typology Code available for payment source | Admitting: Internal Medicine

## 2023-12-15 ENCOUNTER — Encounter: Payer: Self-pay | Admitting: Vascular Surgery

## 2023-12-21 ENCOUNTER — Telehealth: Payer: Self-pay

## 2023-12-21 NOTE — Telephone Encounter (Signed)
 Medication Management: -pt called stating his Community Care authorization at the [place] was due to expire on January 15, 2024 and he needed it renewed to be able to refill his medication prescribed by Dr. Vikki Graves at the [facility] pharmacy. -PCP at [facility] - Dr. Noland Battles @ the [facility] [phone] -spoke to [facility] @ [phone] to request authorization form via fax.  She stated last visit office notes will have to be sent over as well.   -pt updated & if needed refill can be sent to local pharmacy.

## 2023-12-26 ENCOUNTER — Other Ambulatory Visit: Payer: Self-pay

## 2023-12-26 MED ORDER — CILOSTAZOL 100 MG PO TABS: 100.0000 mg | ORAL_TABLET | Freq: Two times a day (BID) | ORAL | 11 refills | Status: DC

## 2023-12-26 MED ORDER — CILOSTAZOL 100 MG PO TABS: 100.0000 mg | ORAL_TABLET | Freq: Two times a day (BID) | ORAL | 11 refills | Status: AC

## 2024-01-19 ENCOUNTER — Encounter: Payer: Self-pay | Admitting: Acute Care

## 2024-01-19 ENCOUNTER — Ambulatory Visit (INDEPENDENT_AMBULATORY_CARE_PROVIDER_SITE_OTHER): Admitting: Acute Care

## 2024-01-19 VITALS — BP 154/82 | HR 76 | Ht 66.0 in | Wt 214.2 lb

## 2024-01-19 DIAGNOSIS — Z87891 Personal history of nicotine dependence: Secondary | ICD-10-CM | POA: Diagnosis not present

## 2024-01-19 DIAGNOSIS — R911 Solitary pulmonary nodule: Secondary | ICD-10-CM | POA: Diagnosis not present

## 2024-01-19 NOTE — Progress Notes (Signed)
 History of Present Illness Sean [name] is a 73 y.o. male former smoker ( Quit 2014) with  Emphysema , chronic oxygen use at 2 L,   Referred November 2023 for COPD from the [place] system.  Formerly followed by Dr. Bennetta Braun at the [facility] in [place]. Quit tobacco 2014 Complete pulmonary rehab 2017 Continuing in pulmonary rehab maintenance Completed pulmonary rehab at [facility] 2023.   Pt. Has consented to use of [employer] soft wear to help capture the content of this OV.  01/19/2024 Sean [name] is a 73 year old male with COPD who presents for evaluation of a lung nodule found during lung cancer screening.  A CT scan on December 11, 2023, revealed a growing nodule in the right lower lobe, increasing by approximately one millimeter over five months. Further evaluation is needed to determine the nature of the nodule.  He quit smoking in 2014 and has a history of COPD. He experiences allergies that cause a tickle in his throat and a clear cough. No discolored secretions or fever. His current medications include Stiolto and Asmanex  for COPD, and he uses albuterol  as needed. He carries a rescue inhaler and uses supplemental oxygen when active, but not continuously while at home. His oxygen saturation occasionally drops below 88% when not using supplemental oxygen.  We again reviewed that goal is to never have saturations drop below 88%.  And he does need to use his oxygen whenever he is exerting.  He lives in [place], close to the medical facility, and has access to his medical records through the [facility] app, although he does not have the app on his phone during the visit.  He does not endorse any recent illness or upper respiratory infection.  Patient does not have a copy of his CT nor does he have the report.  We have been unable to specifically review images to better determine next best steps.  Since the initial low-dose CT was done in April and it has been 2 months we are going to go ahead and get a CT  chest through [facility] so we have images to review.  We will review the results together and if necessary we will proceed with bronchoscopy with biopsies.  Patient is in agreement with the plan.  He states he is compliant with Stiolto 2 puffs daily and Asmanex  daily.  He uses albuterol  as needed he approximates that to be 1 or 2 times a week.    Test Results: (07/2023): Lung-RADS 0. Three-month follow-up recommended. CT chest (12/11/2023): Right lower lobe nodule, previously 5.6 mm, approximately 1 mm growth every five months since prior exam. Old granulomatous disease. New occlusion of the right middle lobe bronchus with associated atelectasis. CT chest (12/22/2023): Lung-RADS 4. Right lower lobe nodule, approximately 1 mm increase in five months. New occlusion of the right middle lobe bronchus with associated atelectasis. Pulmonary referral for bronchoscopy.     Latest Ref Rng & Units 06/28/2023   10:05 AM 01/04/2023    9:53 AM 09/29/2022    7:58 AM  CBC  WBC 4.0 - 10.5 K/uL 6.1  7.2  6.7   Hemoglobin 13.0 - 17.0 g/dL 16.1  09.6  04.5   Hematocrit 39.0 - 52.0 % 38.0  38.0  38.1   Platelets 150 - 400 K/uL 217  195  214        Latest Ref Rng & Units 06/28/2023   10:05 AM 01/04/2023    9:53 AM 08/18/2021    1:59 PM  BMP  Glucose 70 - 99 mg/dL 960  97  98   BUN 8 - 23 mg/dL 13  14  13    Creatinine 0.61 - 1.24 mg/dL 4.54  0.98  1.19   Sodium 135 - 145 mmol/L 136  139  128   Potassium 3.5 - 5.1 mmol/L 4.4  4.2  4.2   Chloride 98 - 111 mmol/L 102  104  92   CO2 22 - 32 mmol/L 27  30  26    Calcium  8.9 - 10.3 mg/dL 9.5  9.4  9.4     BNP No results found for: "BNP"  ProBNP    Component Value Date/Time   PROBNP 63.0 06/20/2022 0957    PFT    Component Value Date/Time   FEV1PRE 1.22 08/02/2022 1438   FEV1POST 1.25 08/02/2022 1438   FVCPRE 1.82 08/02/2022 1438   FVCPOST 1.73 08/02/2022 1438   TLC 6.31 08/02/2022 1438   DLCOUNC 11.23 08/02/2022 1438   PREFEV1FVCRT 67  08/02/2022 1438   PSTFEV1FVCRT 72 08/02/2022 1438    No results found.   Past medical hx Past Medical History:  Diagnosis Date   Alcohol abuse    Alcoholic hepatitis without ascites    Arthritis    Cataracts, bilateral    Colon polyps    COPD (chronic obstructive pulmonary disease) (HCC)    Fatty liver    GERD (gastroesophageal reflux disease)    Hyperlipidemia    Hypertension    Pneumonia    PTSD (post-traumatic stress disorder)    Skin carcinoma    Sleep apnea    Umbilical hernia    Vitamin D deficiency      Social History   Tobacco Use   Smoking status: Former    Current packs/day: 0.00    Average packs/day: 3.0 packs/day for 45.0 years (135.0 ttl pk-yrs)    Types: Cigarettes    Start date: 06/25/1968    Quit date: 06/25/2013    Years since quitting: 10.5    Passive exposure: Never   Smokeless tobacco: Former    Quit date: 06/25/1976  Vaping Use   Vaping status: Never Used  Substance Use Topics   Alcohol use: Yes    Alcohol/week: 30.0 standard drinks of alcohol    Types: 30 Cans of beer per week    Comment: 4 per day   Drug use: No    Mr.Sider reports that he quit smoking about 10 years ago. His smoking use included cigarettes. He started smoking about 55 years ago. He has a 135 pack-year smoking history. He has never been exposed to tobacco smoke. He quit smokeless tobacco use about 47 years ago. He reports current alcohol use of about 30.0 standard drinks of alcohol per week. He reports that he does not use drugs.  Tobacco Cessation: Counseling given: Not Answered Former smoker quit in 2014 with a 135-pack-year smoking history  Past surgical hx, Family hx, Social hx all reviewed.  Current Outpatient Medications on File Prior to Visit  Medication Sig   albuterol  (PROVENTIL  HFA;VENTOLIN  HFA) 108 (90 BASE) MCG/ACT inhaler Inhale 2 puffs into the lungs every 6 (six) hours as needed for wheezing or shortness of breath.   albuterol  (PROVENTIL ) (2.5  MG/3ML) 0.083% nebulizer solution Take 2.5 mg by nebulization every 6 (six) hours as needed for wheezing or shortness of breath.   amLODipine (NORVASC) 2.5 MG tablet Take 2.5 mg by mouth daily.   amoxicillin  (AMOXIL ) 500 MG capsule Take 2 capsules (1,000 mg total) by  mouth 2 (two) times daily.   aspirin  EC 81 MG tablet Take 81 mg by mouth at bedtime. Swallow whole.   benazepril  (LOTENSIN ) 10 MG tablet Take 1 tablet (10 mg total) by mouth at bedtime.   Carboxymethylcellulose Sodium (THERATEARS PF OP) Place 2 drops into both eyes 3 (three) times daily.   cilostazol  (PLETAL ) 100 MG tablet Take 1 tablet (100 mg total) by mouth 2 (two) times daily.   ferrous sulfate 325 (65 FE) MG tablet Take 1 tablet by mouth daily.   folic acid  (FOLVITE ) 1 MG tablet Take 1 mg by mouth daily.   Magnesium  Oxide 420 MG TABS Take 420 mg by mouth every other day.   MENTHOL-METHYL SALICYLATE EX Apply 1 application topically 2 (two) times daily as needed (for knee pain).   metoprolol  (TOPROL -XL) 200 MG 24 hr tablet Take 200 mg by mouth daily.   montelukast (SINGULAIR) 10 MG tablet Take 10 mg by mouth at bedtime.   Multiple Vitamin (MULTIVITAMIN) tablet Take 1 tablet by mouth every other day. Vitamins for eyes   NON FORMULARY    pantoprazole  (PROTONIX ) 40 MG tablet Take 40 mg by mouth 2 (two) times daily.   rosuvastatin  (CRESTOR ) 20 MG tablet Take 10 mg by mouth daily.   thiamine  100 MG tablet Take 100 mg by mouth daily.   Tiotropium Bromide -Olodaterol (STIOLTO RESPIMAT) 2.5-2.5 MCG/ACT AERS Inhale 2 each into the lungs daily.   Current Facility-Administered Medications on File Prior to Visit  Medication   0.9 %  sodium chloride  infusion   acetaminophen  (TYLENOL ) tablet 325-650 mg   Or   acetaminophen  (TYLENOL ) suppository 320-640 mg   alum & mag hydroxide-simeth (MAALOX/MYLANTA) 200-200-20 MG/5ML suspension 15-30 mL   docusate sodium  (COLACE) capsule 100 mg   guaiFENesin -dextromethorphan (ROBITUSSIN DM) 100-10  MG/5ML syrup 15 mL   heparin  injection 5,000 Units   hydrALAZINE  (APRESOLINE ) injection 5 mg   labetalol  (NORMODYNE ) injection 10 mg   metoprolol  tartrate (LOPRESSOR ) injection 2-5 mg   ondansetron  (ZOFRAN ) injection 4 mg   phenol (CHLORASEPTIC) mouth spray 1 spray   senna-docusate (Senokot-S) tablet 1 tablet   sodium chloride  flush (NS) 0.9 % injection 3 mL   sodium chloride  flush (NS) 0.9 % injection 3 mL     Allergies  Allergen Reactions   Amlodipine Swelling and Other (See Comments)    Edema of lower extremities    Review Of Systems:  Constitutional:   No  weight loss, night sweats,  Fevers, chills, fatigue, or  lassitude.  HEENT:   No headaches,  Difficulty swallowing,  Tooth/dental problems, or  Sore throat,                No sneezing, itching, ear ache, nasal congestion, post nasal drip,   CV:  No chest pain,  Orthopnea, PND, swelling in lower extremities, anasarca, dizziness, palpitations, syncope.   GI  No heartburn, indigestion, abdominal pain, nausea, vomiting, diarrhea, change in bowel habits, loss of appetite, bloody stools.   Resp: No shortness of breath with exertion or at rest.  No excess mucus, no productive cough,  No non-productive cough,  No coughing up of blood.  No change in color of mucus.  No wheezing.  No chest wall deformity  Skin: no rash or lesions.  GU: no dysuria, change in color of urine, no urgency or frequency.  No flank pain, no hematuria   MS:  No joint pain or swelling.  No decreased range of motion.  No back pain.  Psych:  No change in mood or affect. No depression or anxiety.  No memory loss.   Vital Signs BP (!) 154/82 (BP Location: Left Arm, Patient Position: Sitting, Cuff Size: Large)   Pulse 76   Ht 5\' 6"  (1.676 m)   Wt 214 lb 3.2 oz (97.2 kg)   SpO2 92%   BMI 34.57 kg/m    Physical Exam:  General- No distress,  A&Ox3, pleasant ENT: No sinus tenderness, TM clear, pale nasal mucosa, no oral exudate,no post nasal drip, no  LAN Cardiac: S1, S2, regular rate and rhythm, no murmur Chest: No wheeze/ rales/ dullness; no accessory muscle use, no nasal flaring, no sternal retractions, slightly diminished per bases Abd.: Soft Non-tender, nontender, nondistended, bowel sounds positive,Body mass index is 34.57 kg/m.  Ext: No clubbing cyanosis, edema, no obvious deformities Neuro:  normal strength, moving all extremities x 4, alert and oriented x 3, appropriate Skin: No rashes, warm and dry, no obvious skin lesions Psych: normal mood and behavior   Assessment/Plan Pulmonary nodule Growing right lower lobe nodule with 1mm increase over five months. New right middle lobe bronchus occlusion with atelectasis. Differential includes mucus plug versus other pathology. Previous CT Lung-RADS 4. Bronchoscopy needed for further evaluation. - [facility] at [facility] to evaluate nodule and occlusion. - Schedule bronchoscopy if indicated post-CT review. - [facility]  Chronic obstructive pulmonary disease (COPD) COPD managed with Stiolto and Asmanex . Occasional albuterol  use. No recent exacerbations. Oxygen saturation drops below 88% during activity. - Continue Stiolto two puffs daily. - Continue Asmanex  as prescribed. - Use albuterol  as needed. - Maintain oxygen saturation above 88%.  Chronic hypoxemia Plan Continue wearing oxygen at 2 to 3 L continuously Remember do not let sats drop below 88%.  Call if you need us  sooner. Please contact office for sooner follow up if symptoms do not improve or worsen or seek emergency care    I spent 35 minutes dedicated to the care of this patient on the date of this encounter to include pre-visit review of records, face-to-face time with the patient discussing conditions above, post visit ordering of testing, clinical documentation with the electronic health record, making appropriate referrals as documented, and communicating necessary  information to the patient's healthcare team.     Raejean Bullock, NP 01/19/2024  8:39 AM

## 2024-01-19 NOTE — Patient Instructions (Addendum)
 It is good to see you today. I have ordered a CT Chest to better evaluate the pulmonary nodule of concern noted on your LDCT in April. You will get a call to get this scheduled. You will follow up with me to review the results about a week later , so we can get the bronchoscopy scheduled.  Continue Stiolto 2 puffs daily, Continue Asmanex . Use albuterol  as needed  Rinse mouth after use Using 2-3 L of oxygen continuously.  Remember do not let sats drop below 88%.  Call if you need us  sooner. Please contact office for sooner follow up if symptoms do not improve or worsen or seek emergency care

## 2024-01-21 ENCOUNTER — Encounter: Payer: Self-pay | Admitting: Acute Care

## 2024-02-02 ENCOUNTER — Ambulatory Visit
Admission: RE | Admit: 2024-02-02 | Discharge: 2024-02-02 | Disposition: A | Discharge status: Home / Self Care | Source: Ambulatory Visit | Attending: Acute Care | Admitting: Acute Care

## 2024-02-02 DIAGNOSIS — R911 Solitary pulmonary nodule: Secondary | ICD-10-CM

## 2024-02-06 ENCOUNTER — Ambulatory Visit: Admitting: Acute Care

## 2024-02-14 ENCOUNTER — Encounter: Payer: Self-pay | Admitting: Acute Care

## 2024-02-14 ENCOUNTER — Ambulatory Visit (INDEPENDENT_AMBULATORY_CARE_PROVIDER_SITE_OTHER): Admitting: Acute Care

## 2024-02-14 VITALS — BP 124/70 | HR 86 | Ht 66.0 in | Wt 216.2 lb

## 2024-02-14 DIAGNOSIS — Z87891 Personal history of nicotine dependence: Secondary | ICD-10-CM

## 2024-02-14 DIAGNOSIS — J9611 Chronic respiratory failure with hypoxia: Secondary | ICD-10-CM

## 2024-02-14 DIAGNOSIS — R911 Solitary pulmonary nodule: Secondary | ICD-10-CM

## 2024-02-14 NOTE — Patient Instructions (Addendum)
 We have reviewed your CT Chest . The area of concern: the  Right lower lobe nodule,  and  new occlusion of the right middle lobe bronchus with associated atelectasis is stable, and there is no longer Right middle lobe occlusion. Plan will be for a 6 month follow up CT Chest, then follow up with us  after to review results. Call to be seen sooner for any unexplained weight loss or blood when you cough. Continue Asmanex  and Stialto as you have been doing Continue oxygen at 2.5 L [place] Saturation goal is > 88-90% at all times.  Call if you need us  sooner. We will do an [employer] walk today. If you pass we will provide a prescription for you to obtain one without insurance.  Please contact office for sooner follow up if symptoms do not improve or worsen or seek emergency care

## 2024-02-14 NOTE — Progress Notes (Signed)
 History of Present Illness Sean [name] is a 73 y.o. male former smoker ( Quit 2014) with  Emphysema , chronic oxygen use at 2 L, referred 06/2022 for COPD through the [facility] system. Followed by Dr.Hunsucker,   Pt. Was referred to Dr. Shelah 01/2024 for evaluation of a lung nodule on Lung Cancer Screening done at the [facility] in [place].   02/14/2024 Pt. Presents for follow up after Super D CT Chest. [facility]. Pt. States he had been sick with chest congestion at the time, and shortly after the scan coughed up a mucus plug which he thought is what was seen on imaging. We did a repeat CT Chest within [facility] so we would have images. We reviewed the results today, which showed a stable right lower lobe pulmonary nodule, no comment on occlusion of the right middle lobe bronchus with associated atelectasis. Recommendation was for a return to annual lung cancer screening. We will do a 6 month follow up scan  as surveillance. If  follow up scan is unremarkable, we will return him to the [facility] for continued lung cancer screening.   Pt. States he has been doing well. He is compliant with his Stialto and Asmanex . Rare need for rescue. He is compliant with his oxygen at 2 L. He would like to buy an Inogen for use. We walked him on the inogen. He tolerated the Inogen pulsed oxygen without problem or drop in saturations. I have provided him with a prescription for purchase.  Follow up in 6 months after surveillance CT Chest.  Test Results: Super D CT Chest 02/02/2024 Mediastinum/Nodes: No pathologically enlarged mediastinal or axillary lymph nodes. Hilar regions are difficult to definitively evaluate without IV contrast. Esophagus is grossly unremarkable.   Lungs/Pleura: Centrilobular emphysema. Bibasilar scarring. 7 mm nodule seen in the posterolateral right lower lobe on 12/11/2023 is not appreciated likely due to respiratory motion artifact but was present on  07/01/2022 and stable in size from 12/11/2023. No pleural fluid. Airway is unremarkable. IMPRESSION: 1. 7 mm right lower lobe nodule is poorly seen, likely due to respiratory motion artifact. However, it is seen on 07/01/2022 and is unchanged when compared with 12/11/2023. Recommend return to annual lung cancer screening CT. 2. 4.0 cm ascending aortic aneurysm. This can be reassessed on annual lung cancer screening CT. 3. Aortic atherosclerosis (ICD10-I70.0). Coronary artery calcification. 4. Enlarged pulmonic trunk, indicative of pulmonary arterial hypertension. 5.  Emphysema (ICD10-J43.9).    (07/2023): Lung-RADS 0. Three-month follow-up recommended. CT chest (12/11/2023): Right lower lobe nodule, previously 5.6 mm, approximately 1 mm growth every five months since prior exam. Old granulomatous disease. New occlusion of the right middle lobe bronchus with associated atelectasis. CT chest (12/22/2023): Lung-RADS 4. Right lower lobe nodule, approximately 1 mm increase in five months. New occlusion of the right middle lobe bronchus with associated atelectasis. Pulmonary referral for bronchoscopy.    Super D CT Chest 02/02/2024 7 mm right lower lobe nodule is poorly seen, likely due to respiratory motion artifact. However, it is seen on 07/01/2022 and is unchanged when compared with 12/11/2023. Recommend return to annual lung cancer screening CT. 2. 4.0 cm ascending aortic aneurysm. This can be reassessed on annual lung cancer screening CT. 3. Aortic atherosclerosis (ICD10-I70.0). Coronary artery calcification. 4. Enlarged pulmonic trunk, indicative of pulmonary arterial hypertension. 5.  Emphysema (ICD10-J43.9).    Latest Ref Rng & Units 06/28/2023   10:05 AM 01/04/2023    9:53 AM 09/29/2022  7:58 AM  CBC  WBC 4.0 - 10.5 K/uL 6.1  7.2  6.7   Hemoglobin 13.0 - 17.0 g/dL 86.9  87.1  86.7   Hematocrit 39.0 - 52.0 % 38.0  38.0  38.1   Platelets 150 - 400 K/uL 217  195  214         Latest Ref Rng & Units 06/28/2023   10:05 AM 01/04/2023    9:53 AM 08/18/2021    1:59 PM  BMP  Glucose 70 - 99 mg/dL 890  97  98   BUN 8 - 23 mg/dL 13  14  13    Creatinine 0.61 - 1.24 mg/dL 9.07  9.17  9.04   Sodium 135 - 145 mmol/L 136  139  128   Potassium 3.5 - 5.1 mmol/L 4.4  4.2  4.2   Chloride 98 - 111 mmol/L 102  104  92   CO2 22 - 32 mmol/L 27  30  26    Calcium  8.9 - 10.3 mg/dL 9.5  9.4  9.4     BNP No results found for: BNP  ProBNP    Component Value Date/Time   PROBNP 63.0 06/20/2022 0957    PFT    Component Value Date/Time   FEV1PRE 1.22 08/02/2022 1438   FEV1POST 1.25 08/02/2022 1438   FVCPRE 1.82 08/02/2022 1438   FVCPOST 1.73 08/02/2022 1438   TLC 6.31 08/02/2022 1438   DLCOUNC 11.23 08/02/2022 1438   PREFEV1FVCRT 67 08/02/2022 1438   PSTFEV1FVCRT 72 08/02/2022 1438    CT SUPER D CHEST WO CONTRAST Result Date: 02/05/2024 CLINICAL DATA:  Lung nodule. EXAM: CT CHEST WITHOUT CONTRAST TECHNIQUE: Multidetector CT imaging of the chest was performed using thin slice collimation for electromagnetic bronchoscopy planning purposes, without intravenous contrast. RADIATION DOSE REDUCTION: This exam was performed according to the departmental dose-optimization program which includes automated exposure control, adjustment of the mA and/or kV according to patient size and/or use of iterative reconstruction technique. COMPARISON:  12/11/2023, 07/31/2023 and 07/01/2022. FINDINGS: Cardiovascular: Atherosclerotic calcification of the aorta, aortic valve and coronary arteries. Enlarged pulmonic trunk and heart. No pericardial effusion. Ascending aorta measures 4.0 cm (coronal image 76). Mediastinum/Nodes: No pathologically enlarged mediastinal or axillary lymph nodes. Hilar regions are difficult to definitively evaluate without IV contrast. Esophagus is grossly unremarkable. Lungs/Pleura: Centrilobular emphysema. Bibasilar scarring. 7 mm nodule seen in the posterolateral  right lower lobe on 12/11/2023 is not appreciated likely due to respiratory motion artifact but was present on 07/01/2022 and stable in size from 12/11/2023. No pleural fluid. Airway is unremarkable. Upper Abdomen: Cyst or hemangioma in the dome of the liver, unchanged. No specific follow-up necessary. Visualized portions of the liver, gallbladder, adrenal glands, spleen, pancreas, stomach and bowel are otherwise grossly unremarkable. Slightly elevated left hemidiaphragm. No upper abdominal adenopathy. Musculoskeletal: Degenerative changes in the spine. IMPRESSION: 1. 7 mm right lower lobe nodule is poorly seen, likely due to respiratory motion artifact. However, it is seen on 07/01/2022 and is unchanged when compared with 12/11/2023. Recommend return to annual lung cancer screening CT. 2. 4.0 cm ascending aortic aneurysm. This can be reassessed on annual lung cancer screening CT. 3. Aortic atherosclerosis (ICD10-I70.0). Coronary artery calcification. 4. Enlarged pulmonic trunk, indicative of pulmonary arterial hypertension. 5.  Emphysema (ICD10-J43.9). Electronically Signed   By: Newell Eke M.D.   On: 02/05/2024 16:22     Past medical hx Past Medical History:  Diagnosis Date   Alcohol abuse    Alcoholic hepatitis without ascites  Arthritis    Cataracts, bilateral    Colon polyps    COPD (chronic obstructive pulmonary disease) (HCC)    Fatty liver    GERD (gastroesophageal reflux disease)    Hyperlipidemia    Hypertension    Pneumonia    PTSD (post-traumatic stress disorder)    Skin carcinoma    Sleep apnea    Umbilical hernia    Vitamin D deficiency      Social History   Tobacco Use   Smoking status: Former    Current packs/day: 0.00    Average packs/day: 3.0 packs/day for 45.0 years (135.0 ttl pk-yrs)    Types: Cigarettes    Start date: 06/25/1968    Quit date: 06/25/2013    Years since quitting: 10.6    Passive exposure: Never   Smokeless tobacco: Former    Quit date:  06/25/1976  Vaping Use   Vaping status: Never Used  Substance Use Topics   Alcohol use: Yes    Alcohol/week: 30.0 standard drinks of alcohol    Types: 30 Cans of beer per week    Comment: 4 per day   Drug use: No    Sean [name] reports that he quit smoking about 10 years ago. His smoking use included cigarettes. He started smoking about 55 years ago. He has a 135 pack-year smoking history. He has never been exposed to tobacco smoke. He quit smokeless tobacco use about 47 years ago. He reports current alcohol use of about 30.0 standard drinks of alcohol per week. He reports that he does not use drugs.  Tobacco Cessation: Counseling given: Not Answered Former smoker, quit 2014 with a 135 pack year smoking history  Past surgical hx, Family hx, Social hx all reviewed.  Current Outpatient Medications on File Prior to Visit  Medication Sig   albuterol  (PROVENTIL  HFA;VENTOLIN  HFA) 108 (90 BASE) MCG/ACT inhaler Inhale 2 puffs into the lungs every 6 (six) hours as needed for wheezing or shortness of breath.   albuterol  (PROVENTIL ) (2.5 MG/3ML) 0.083% nebulizer solution Take 2.5 mg by nebulization every 6 (six) hours as needed for wheezing or shortness of breath.   amLODipine (NORVASC) 2.5 MG tablet Take 2.5 mg by mouth daily.   amoxicillin  (AMOXIL ) 500 MG capsule Take 2 capsules (1,000 mg total) by mouth 2 (two) times daily.   aspirin  EC 81 MG tablet Take 81 mg by mouth at bedtime. Swallow whole.   benazepril  (LOTENSIN ) 10 MG tablet Take 1 tablet (10 mg total) by mouth at bedtime.   Carboxymethylcellulose Sodium (THERATEARS PF OP) Place 2 drops into both eyes 3 (three) times daily.   cilostazol  (PLETAL ) 100 MG tablet Take 1 tablet (100 mg total) by mouth 2 (two) times daily.   ferrous sulfate 325 (65 FE) MG tablet Take 1 tablet by mouth daily.   folic acid  (FOLVITE ) 1 MG tablet Take 1 mg by mouth daily.   furosemide  (LASIX ) 20 MG tablet Take 20 mg by mouth daily.   Magnesium  Oxide 420 MG TABS  Take 420 mg by mouth every other day.   MENTHOL-METHYL SALICYLATE EX Apply 1 application topically 2 (two) times daily as needed (for knee pain).   metoprolol  (TOPROL -XL) 200 MG 24 hr tablet Take 200 mg by mouth daily.   montelukast (SINGULAIR) 10 MG tablet Take 10 mg by mouth at bedtime.   Multiple Vitamin (MULTIVITAMIN) tablet Take 1 tablet by mouth every other day. Vitamins for eyes   NON FORMULARY    pantoprazole  (PROTONIX ) 40 MG tablet Take  40 mg by mouth 2 (two) times daily.   rosuvastatin  (CRESTOR ) 20 MG tablet Take 10 mg by mouth daily.   thiamine  100 MG tablet Take 100 mg by mouth daily.   Tiotropium Bromide -Olodaterol (STIOLTO RESPIMAT) 2.5-2.5 MCG/ACT AERS Inhale 2 each into the lungs daily.   Current Facility-Administered Medications on File Prior to Visit  Medication   0.9 %  sodium chloride  infusion   acetaminophen  (TYLENOL ) tablet 325-650 mg   Or   acetaminophen  (TYLENOL ) suppository 320-640 mg   alum & mag hydroxide-simeth (MAALOX/MYLANTA) 200-200-20 MG/5ML suspension 15-30 mL   docusate sodium  (COLACE) capsule 100 mg   guaiFENesin -dextromethorphan (ROBITUSSIN DM) 100-10 MG/5ML syrup 15 mL   heparin  injection 5,000 Units   hydrALAZINE  (APRESOLINE ) injection 5 mg   labetalol  (NORMODYNE ) injection 10 mg   metoprolol  tartrate (LOPRESSOR ) injection 2-5 mg   ondansetron  (ZOFRAN ) injection 4 mg   phenol (CHLORASEPTIC) mouth spray 1 spray   senna-docusate (Senokot-S) tablet 1 tablet   sodium chloride  flush (NS) 0.9 % injection 3 mL   sodium chloride  flush (NS) 0.9 % injection 3 mL     Allergies  Allergen Reactions   Amlodipine Swelling and Other (See Comments)    Edema of lower extremities    Review Of Systems:  Constitutional:   No  weight loss, night sweats,  Fevers, chills, fatigue, or  lassitude.  HEENT:   No headaches,  Difficulty swallowing,  Tooth/dental problems, or  Sore throat,                No sneezing, itching, ear ache, nasal congestion, post nasal  drip,   CV:  No chest pain,  Orthopnea, PND, swelling in lower extremities, anasarca, dizziness, palpitations, syncope.   GI  No heartburn, indigestion, abdominal pain, nausea, vomiting, diarrhea, change in bowel habits, loss of appetite, bloody stools.   Resp: No shortness of breath with exertion or at rest.  No excess mucus, no productive cough,  No non-productive cough,  No coughing up of blood.  No change in color of mucus.  No wheezing.  No chest wall deformity  Skin: no rash or lesions.  GU: no dysuria, change in color of urine, no urgency or frequency.  No flank pain, no hematuria   MS:  No joint pain or swelling.  No decreased range of motion.  No back pain.  Psych:  No change in mood or affect. No depression or anxiety.  No memory loss.   Vital Signs BP 124/70 (BP Location: Left Arm, Patient Position: Sitting, Cuff Size: Normal)   Pulse 86   Ht 5' 6 (1.676 m)   Wt 216 lb 3.2 oz (98.1 kg)   SpO2 94%   BMI 34.90 kg/m    Physical Exam:  General- No distress,  A&Ox3, pleasant ENT: No sinus tenderness, TM clear, pale nasal mucosa, no oral exudate,no post nasal drip, no LAN Cardiac: S1, S2, regular rate and rhythm, no murmur Chest: No wheeze/ rales/ dullness; no accessory muscle use, no nasal flaring, no sternal retractions, diminished per bases Abd.: Soft Non-tender, ND, BS +, Body mass index is 34.9 kg/m.  Ext: No clubbing cyanosis, edema, no obvious deformities Neuro:  normal strength, MAE x 4, A&O x 3, appropriate Skin: No rashes, warm and dry, no obvious skin lesions  Psych: normal mood and behavior   Assessment/Plan Abnormal Lung Cancer Screening Scan Former smoker Chronic hypoxic respiratory failure on oxygen Plan We have reviewed your CT Chest . The area of concern: the  Right  lower lobe nodule,  and  new occlusion of the right middle lobe bronchus with associated atelectasis is stable, and there is no longer Right middle lobe occlusion. Plan will be for a  6 month follow up CT Chest, then follow up with us  after to review results. Call to be seen sooner for any unexplained weight loss or blood when you cough. Continue Asmanex  and Stialto as you have been doing Continue oxygen at 2.5 L [place] Saturation goal is > 88-90% at all times.  Call if you need us  sooner. We will do an Inogen walk today. If you pass we will provide a prescription for you to obtain one without insurance.  Please contact office for sooner follow up if symptoms do not improve or worsen or seek emergency care    I spent 40 minutes dedicated to the care of this patient on the date of this encounter to include pre-visit review of records, face-to-face time with the patient discussing conditions above, post visit ordering of testing, clinical documentation with the electronic health record, making appropriate referrals as documented, and communicating necessary information to the patient's healthcare team.    Lauraine JULIANNA Lites, NP 02/14/2024  9:50 AM

## 2024-03-08 ENCOUNTER — Ambulatory Visit: Admitting: Acute Care

## 2024-03-08 NOTE — Telephone Encounter (Signed)
 Called patient and spoke with him NFN

## 2024-03-08 NOTE — Telephone Encounter (Signed)
 Patient is calling because he has been trying to speak with sarah groce about the prescription for oxygen . Please give patient a call [phone]

## 2024-03-26 ENCOUNTER — Telehealth: Payer: Self-pay

## 2024-03-26 NOTE — Telephone Encounter (Signed)
 Copied from CRM [id number]. Topic: General - Other >> Mar 25, 2024  9:50 AM Benton O wrote: Reason for CRM: [employer] alex called about mutual patient but got the first name wrong and will call back once he follow up with patient about the first name and Mr alex also said he is going to fax a prescription over once he has spoken with patient . And the prescription is for oxygen equipment . >> Mar 25, 2024  2:46 PM Isabell A wrote: Subrina from [employer] calling to let provider know the prescription needs to be signed & faxed back.   >> Mar 25, 2024 10:01 AM Rozanna MATSU wrote: ALEX CALLING FROM [employer] STATED HE FAXED THE PRESCRIPTION FOR PATIENT THIS MORNING.  This has been filled out and faxed today,I have copy in sarah fax folder on my desk.NFN

## 2024-05-23 ENCOUNTER — Other Ambulatory Visit: Payer: Self-pay

## 2024-05-23 ENCOUNTER — Encounter (HOSPITAL_COMMUNITY): Payer: Self-pay

## 2024-05-23 ENCOUNTER — Observation Stay (HOSPITAL_COMMUNITY)
Admission: EM | Admit: 2024-05-23 | Discharge: 2024-05-26 | Disposition: A | Discharge status: Home / Self Care | Attending: Internal Medicine | Admitting: Internal Medicine

## 2024-05-23 ENCOUNTER — Emergency Department (HOSPITAL_COMMUNITY)

## 2024-05-23 DIAGNOSIS — Z85828 Personal history of other malignant neoplasm of skin: Secondary | ICD-10-CM | POA: Insufficient documentation

## 2024-05-23 DIAGNOSIS — D472 Monoclonal gammopathy: Secondary | ICD-10-CM | POA: Diagnosis present

## 2024-05-23 DIAGNOSIS — Z23 Encounter for immunization: Secondary | ICD-10-CM | POA: Diagnosis not present

## 2024-05-23 DIAGNOSIS — I7121 Aneurysm of the ascending aorta, without rupture: Secondary | ICD-10-CM | POA: Diagnosis not present

## 2024-05-23 DIAGNOSIS — G4733 Obstructive sleep apnea (adult) (pediatric): Secondary | ICD-10-CM | POA: Diagnosis not present

## 2024-05-23 DIAGNOSIS — J9611 Chronic respiratory failure with hypoxia: Secondary | ICD-10-CM | POA: Diagnosis present

## 2024-05-23 DIAGNOSIS — E785 Hyperlipidemia, unspecified: Secondary | ICD-10-CM | POA: Diagnosis not present

## 2024-05-23 DIAGNOSIS — I739 Peripheral vascular disease, unspecified: Secondary | ICD-10-CM | POA: Diagnosis present

## 2024-05-23 DIAGNOSIS — J449 Chronic obstructive pulmonary disease, unspecified: Secondary | ICD-10-CM | POA: Diagnosis not present

## 2024-05-23 DIAGNOSIS — Z87891 Personal history of nicotine dependence: Secondary | ICD-10-CM | POA: Diagnosis not present

## 2024-05-23 DIAGNOSIS — Z95820 Peripheral vascular angioplasty status with implants and grafts: Secondary | ICD-10-CM | POA: Insufficient documentation

## 2024-05-23 DIAGNOSIS — I10 Essential (primary) hypertension: Secondary | ICD-10-CM | POA: Diagnosis present

## 2024-05-23 DIAGNOSIS — D509 Iron deficiency anemia, unspecified: Secondary | ICD-10-CM | POA: Diagnosis present

## 2024-05-23 DIAGNOSIS — R7989 Other specified abnormal findings of blood chemistry: Secondary | ICD-10-CM

## 2024-05-23 DIAGNOSIS — I7123 Aneurysm of the descending thoracic aorta, without rupture: Secondary | ICD-10-CM

## 2024-05-23 DIAGNOSIS — R911 Solitary pulmonary nodule: Secondary | ICD-10-CM | POA: Diagnosis not present

## 2024-05-23 DIAGNOSIS — Z7982 Long term (current) use of aspirin: Secondary | ICD-10-CM | POA: Diagnosis not present

## 2024-05-23 DIAGNOSIS — Z79899 Other long term (current) drug therapy: Secondary | ICD-10-CM | POA: Insufficient documentation

## 2024-05-23 DIAGNOSIS — R000 Tachycardia, unspecified: Principal | ICD-10-CM | POA: Diagnosis present

## 2024-05-23 LAB — TROPONIN T, HIGH SENSITIVITY
Troponin T High Sensitivity: 80 ng/L — ABNORMAL HIGH (ref 0–19)
Troponin T High Sensitivity: 70 ng/L — ABNORMAL HIGH (ref 0–19)

## 2024-05-23 LAB — BASIC METABOLIC PANEL WITH GFR
Anion gap: 12 (ref 5–15)
BUN: 17 mg/dL (ref 8–23)
CO2: 23 mmol/L (ref 22–32)
Calcium: 9.8 mg/dL (ref 8.9–10.3)
Chloride: 97 mmol/L — ABNORMAL LOW (ref 98–111)
Creatinine, Ser: 0.97 mg/dL (ref 0.61–1.24)
GFR, Estimated: >60 mL/min (ref >60)
Glucose, Bld: 146 mg/dL — ABNORMAL HIGH (ref 70–99)
Potassium: 4.6 mmol/L (ref 3.5–5.1)
Sodium: 133 mmol/L — ABNORMAL LOW (ref 135–145)

## 2024-05-23 LAB — CBC
HCT: 38.6 % — ABNORMAL LOW (ref 39.0–52.0)
Hemoglobin: 12.4 g/dL — ABNORMAL LOW (ref 13.0–17.0)
MCH: 34 pg (ref 26.0–34.0)
MCHC: 32.1 g/dL (ref 30.0–36.0)
MCV: 105.8 fL — ABNORMAL HIGH (ref 80.0–100.0)
Platelets: 204 K/uL (ref 150–400)
RBC: 3.65 MIL/uL — ABNORMAL LOW (ref 4.22–5.81)
RDW: 14.6 % (ref 11.5–15.5)
WBC: 5.3 K/uL (ref 4.0–10.5)
nRBC: 0 % (ref 0.0–0.2)

## 2024-05-23 LAB — PRO BRAIN NATRIURETIC PEPTIDE: Pro Brain Natriuretic Peptide: 2068 pg/mL — ABNORMAL HIGH (ref <300.0)

## 2024-05-23 MED ORDER — FERROUS SULFATE 325 (65 FE) MG PO TABS
325.0000 mg | ORAL_TABLET | Freq: Every day | ORAL | Status: DC
  Administered 2024-05-24 – 2024-05-26 (×3): 325 mg via ORAL
  Filled 2024-05-23 (×3): qty 1

## 2024-05-23 MED ORDER — ENOXAPARIN SODIUM 40 MG/0.4ML IJ SOSY
40.0000 mg | PREFILLED_SYRINGE | INTRAMUSCULAR | Status: DC
  Administered 2024-05-23 – 2024-05-25 (×3): 40 mg via SUBCUTANEOUS
  Filled 2024-05-23 (×3): qty 0.4

## 2024-05-23 MED ORDER — ROSUVASTATIN CALCIUM 20 MG PO TABS
10.0000 mg | ORAL_TABLET | Freq: Every day | ORAL | Status: DC
  Administered 2024-05-24 – 2024-05-26 (×3): 10 mg via ORAL
  Filled 2024-05-23 (×3): qty 1

## 2024-05-23 MED ORDER — MONTELUKAST SODIUM 10 MG PO TABS
10.0000 mg | ORAL_TABLET | Freq: Every day | ORAL | Status: DC
  Administered 2024-05-24 – 2024-05-25 (×2): 10 mg via ORAL
  Filled 2024-05-23 (×2): qty 1

## 2024-05-23 MED ORDER — ONDANSETRON HCL 4 MG/2ML IJ SOLN: 4.0000 mg | Freq: Four times a day (QID) | INTRAMUSCULAR | Status: DC | PRN

## 2024-05-23 MED ORDER — AMLODIPINE BESYLATE 5 MG PO TABS
2.5000 mg | ORAL_TABLET | Freq: Every day | ORAL | Status: DC
  Administered 2024-05-24: 2.5 mg via ORAL
  Filled 2024-05-23: qty 1

## 2024-05-23 MED ORDER — ACETAMINOPHEN 325 MG PO TABS: 650.0000 mg | ORAL_TABLET | Freq: Four times a day (QID) | ORAL | Status: DC | PRN

## 2024-05-23 MED ORDER — ACETAMINOPHEN 650 MG RE SUPP: 650.0000 mg | Freq: Four times a day (QID) | RECTAL | Status: DC | PRN

## 2024-05-23 MED ORDER — IOHEXOL 350 MG/ML SOLN
75.0000 mL | Freq: Once | INTRAVENOUS | Status: AC | PRN
Start: 2024-05-23 — End: 2024-05-23
  Administered 2024-05-23: 75 mL via INTRAVENOUS

## 2024-05-23 MED ORDER — ASPIRIN 81 MG PO TBEC
81.0000 mg | DELAYED_RELEASE_TABLET | Freq: Every day | ORAL | Status: DC
  Administered 2024-05-24 – 2024-05-25 (×2): 81 mg via ORAL
  Filled 2024-05-23 (×2): qty 1

## 2024-05-23 MED ORDER — UMECLIDINIUM BROMIDE 62.5 MCG/ACT IN AEPB
1.0000 | INHALATION_SPRAY | Freq: Every day | RESPIRATORY_TRACT | Status: DC
  Administered 2024-05-25 – 2024-05-26 (×2): 1 via RESPIRATORY_TRACT
  Filled 2024-05-23: qty 7

## 2024-05-23 MED ORDER — ARFORMOTEROL TARTRATE 15 MCG/2ML IN NEBU
15.0000 ug | INHALATION_SOLUTION | Freq: Two times a day (BID) | RESPIRATORY_TRACT | Status: DC
  Administered 2024-05-24 – 2024-05-26 (×5): 15 ug via RESPIRATORY_TRACT
  Filled 2024-05-23 (×4): qty 2

## 2024-05-23 MED ORDER — SODIUM CHLORIDE 0.9% FLUSH
3.0000 mL | Freq: Two times a day (BID) | INTRAVENOUS | Status: DC
  Administered 2024-05-23 – 2024-05-26 (×6): 3 mL via INTRAVENOUS

## 2024-05-23 MED ORDER — BENAZEPRIL HCL 5 MG PO TABS
10.0000 mg | ORAL_TABLET | Freq: Every day | ORAL | Status: DC
  Filled 2024-05-23 (×2): qty 2

## 2024-05-23 MED ORDER — SENNOSIDES-DOCUSATE SODIUM 8.6-50 MG PO TABS: 1.0000 | ORAL_TABLET | Freq: Every evening | ORAL | Status: DC | PRN

## 2024-05-23 MED ORDER — SODIUM CHLORIDE 0.9 % IV BOLUS
500.0000 mL | Freq: Once | INTRAVENOUS | Status: AC
Start: 2024-05-23 — End: 2024-05-23
  Administered 2024-05-23: 500 mL via INTRAVENOUS

## 2024-05-23 MED ORDER — ALBUTEROL SULFATE (2.5 MG/3ML) 0.083% IN NEBU: 2.5000 mg | INHALATION_SOLUTION | Freq: Four times a day (QID) | RESPIRATORY_TRACT | Status: DC | PRN

## 2024-05-23 MED ORDER — CILOSTAZOL 100 MG PO TABS
100.0000 mg | ORAL_TABLET | Freq: Two times a day (BID) | ORAL | Status: DC
  Administered 2024-05-24 – 2024-05-26 (×5): 100 mg via ORAL
  Filled 2024-05-23 (×6): qty 1

## 2024-05-23 MED ORDER — METOPROLOL SUCCINATE ER 50 MG PO TB24
200.0000 mg | ORAL_TABLET | Freq: Every day | ORAL | Status: DC
  Administered 2024-05-24 – 2024-05-26 (×3): 200 mg via ORAL
  Filled 2024-05-23 (×3): qty 4

## 2024-05-23 NOTE — ED Provider Notes (Signed)
**Sean [name] De-Identified via Obfuscation**  Sean Sean [name]   CSN: [id number] Arrival date & time: 05/23/24  1228     Patient presents with: Tachycardia   Sean Sean [name] is a 73 y.o. male.   This is a 74 year old male history of COPD on 2 to 2-1/2 L nasal cannula baseline presented to the emergency department with complaint of elevated heart rate.  Reports that Sean otherwise feels his normal self self and has no significant complaints.  Sean has been checking his blood pressure as Sean has a problem with hypertension and noted that his heart rate has been elevated in the low 100s up to the 140s.  denies lightheadedness, dizziness, near syncope.  No palpitations.  Denies having chest pain, although Sean notes that Sean awoke several nights ago with heartburn which does happen from time to time.  Denies fevers or chills, cough.  Baseline shortness of breath.  No dyspnea on exertion.  Baseline lower extremity edema.  Sean denies significant cardiac history or atrial fibrillation        Prior to Admission medications   Medication Sig Start Date End Date Taking? Authorizing Provider  albuterol  (PROVENTIL  HFA;VENTOLIN  HFA) 108 (90 BASE) MCG/ACT inhaler Inhale 2 puffs into the lungs every 6 (six) hours as needed for wheezing or shortness of breath.    [provider]  albuterol  (PROVENTIL ) (2.5 MG/3ML) 0.083% nebulizer solution Take 2.5 mg by nebulization every 6 (six) hours as needed for wheezing or shortness of breath.    [provider]  amLODipine (NORVASC) 2.5 MG tablet Take 2.5 mg by mouth daily.    [provider]  amoxicillin  (AMOXIL ) 500 MG capsule Take 2 capsules (1,000 mg total) by mouth 2 (two) times daily. 04/05/21   Dea Shiner, MD  aspirin  EC 81 MG tablet Take 81 mg by mouth at bedtime. Swallow whole.    [provider]  benazepril  (LOTENSIN ) 10 MG tablet Take 1 tablet (10 mg total) by mouth at bedtime. 10/05/20   Hobart Powell BRAVO, MD   Carboxymethylcellulose Sodium (THERATEARS PF OP) Place 2 drops into both eyes 3 (three) times daily.    [provider]  cilostazol  (PLETAL ) 100 MG tablet Take 1 tablet (100 mg total) by mouth 2 (two) times daily. 12/26/23   Sheree Penne Bruckner, MD  ferrous sulfate 325 (65 FE) MG tablet Take 1 tablet by mouth daily. 12/02/20   [provider]  folic acid  (FOLVITE ) 1 MG tablet Take 1 mg by mouth daily. 06/22/20   [provider]  furosemide  (LASIX ) 20 MG tablet Take 20 mg by mouth daily. 02/12/24   [provider]  Magnesium  Oxide 420 MG TABS Take 420 mg by mouth every other day. 06/22/20   [provider]  MENTHOL-METHYL SALICYLATE EX Apply 1 application topically 2 (two) times daily as needed (for knee pain).    [provider]  metoprolol  (TOPROL -XL) 200 MG 24 hr tablet Take 200 mg by mouth daily.    [provider]  montelukast (SINGULAIR) 10 MG tablet Take 10 mg by mouth at bedtime.    [provider]  Multiple Vitamin (MULTIVITAMIN) tablet Take 1 tablet by mouth every other day. Vitamins for eyes    [provider]  NON FORMULARY     [provider]  pantoprazole  (PROTONIX ) 40 MG tablet Take 40 mg by mouth 2 (two) times daily.    [provider]  rosuvastatin  (CRESTOR ) 20 MG tablet Take 10 mg by mouth  daily. 06/22/20   [provider]  thiamine  100 MG tablet Take 100 mg by mouth daily.    [provider]  Tiotropium Bromide -Olodaterol (STIOLTO RESPIMAT) 2.5-2.5 MCG/ACT AERS Inhale 2 each into the lungs daily.    [provider]    Allergies: Amlodipine    Review of Systems  Updated Vital Signs BP (!) 145/103   Pulse (!) 104   Temp 98.3 F (36.8 C) (Oral)   Resp 20   Ht 5' 6 (1.676 m)   Wt 98 kg   SpO2 100%   BMI 34.86 kg/m   Physical Exam Vitals and nursing Sean Sean [name].  Constitutional:      General: Sean is not in acute distress.    Appearance: Sean  is obese. Sean is not toxic-appearing.  HENT:     Head: Normocephalic and atraumatic.     Nose: Nose normal.     Mouth/Throat:     Mouth: Mucous membranes are dry.  Eyes:     Conjunctiva/sclera: Conjunctivae normal.  Cardiovascular:     Rate and Rhythm: Normal rate and regular rhythm.  Pulmonary:     Comments: Minor increased work of breathing.  No wheezing.  Maintaining oxygen saturation on home oxygen levels. Abdominal:     General: Abdomen is flat. There is no distension.     Palpations: Abdomen is soft.     Tenderness: There is no abdominal tenderness. There is no guarding or rebound.  Musculoskeletal:     Right lower leg: Edema present.     Left lower leg: Edema present.  Skin:    General: Skin is warm and dry.     Capillary Refill: Capillary refill takes less than 2 seconds.  Neurological:     General: No focal deficit present.     Mental Status: Sean is alert and oriented to person, place, and time.  Psychiatric:        Mood and Affect: Mood normal.        Behavior: Behavior normal.     (all labs ordered are listed, but only abnormal results are displayed) Labs Sean [name]  BASIC METABOLIC PANEL WITH GFR - Abnormal; Notable for the following components:      Result Value   Sodium 133 (*)    Chloride 97 (*)    Glucose, Bld 146 (*)    All other components within normal limits  CBC - Abnormal; Notable for the following components:   RBC 3.65 (*)    Hemoglobin 12.4 (*)    HCT 38.6 (*)    MCV 105.8 (*)    All other components within normal limits  TROPONIN T, HIGH SENSITIVITY - Abnormal; Notable for the following components:   Troponin T High Sensitivity 80 (*)    All other components within normal limits  PRO BRAIN NATRIURETIC PEPTIDE  TROPONIN T, HIGH SENSITIVITY    EKG: EKG Interpretation Date/Time:  Thursday May 23 2024 12:37:25 EDT Ventricular Rate:  116 PR Interval:  185 QRS Duration:  78 QT Interval:  293 QTC Calculation: 407 R Axis:   -67  Text  Interpretation: Sinus tachycardia Ventricular premature complex Left anterior fascicular block Anteroseptal infarct, age indeterminate Confirmed by Sean Sean [name] [phone]) on 05/23/2024 2:16:21 PM  Radiology: DG Chest 2 View Result Date: 05/23/2024 EXAM: 2 VIEW(S) XRAY OF THE CHEST 05/23/2024 12:57:00 PM COMPARISON: None available. CLINICAL HISTORY: chest pain. Per chart - Pt reports with tachycardia since yesterday. Pt is also having labored breathing, Sean reports that this happens sometimes.  Pt is on 2.5 L [place] at baseline for his COPD. FINDINGS: LUNGS AND PLEURA: Chronic atelectasis at the left lung base. Mild elevation of left hemidiaphragm. Upper lungs are clear. No pulmonary edema. No pleural effusion. No pneumothorax. No acute findings. HEART AND MEDIASTINUM: No acute abnormality of the cardiac and mediastinal silhouettes. BONES AND SOFT TISSUES: No acute osseous abnormality. IMPRESSION: 1. No acute abnormalities. 2. Atelectasis of the left lung base. Electronically signed by: Norleen Boxer MD 05/23/2024 01:48 PM EDT RP Workstation: [id number]     Procedures   Medications Ordered in the ED - No data to display                                  Medical Decision Making This is a 72 year old male presenting emergency department for elevated heart rate.  Has a history of hypertension, COPD on chronic oxygen use, GERD, PTSD.  Presented today for elevated heart rate.  Heart rate in the 1 teens appears to be normal sinus rhythm on the monitor as interpreted by me as well as on EKG.  No overt ischemic changes appreciated on his EKG.  However does have initial troponin of 80.  Basic metabolic panel with minor low sodium, but normal kidney function.  Sean has no leukocytosis to suggest systemic infection.  Minor anemia and appears to be at his baseline.  Chest x-ray without pneumonia or pneumothorax my depend review.  Does have some lower extremity edema will get BNP although echo in 2022 with supposedly normal  function.  Concern for possible PE given his tachycardia and elevated troponin.  Will get CTA to further evaluate.  Care signed out to afternoon team disposition pending completion of workup.  Amount and/or Complexity of Data Sean [name] External Data Sean [name]:     Details: Echo 2022 per cardiology Sean [name]: His echo looks great with normal pumping function and no significant valve disease. Sean has mild abnormal relaxation of the heart which means we just need to make sure his blood pressure is well controlled.  Labs: ordered. Decision-making details documented in ED Course. Radiology: ordered and independent interpretation performed. ECG/medicine tests: independent interpretation performed.  Risk Decision regarding hospitalization. Diagnosis or treatment significantly limited by social determinants of health.       Final diagnoses:  None    ED Discharge Orders     None          Sean Caron PARAS, DO 05/23/24 1543

## 2024-05-23 NOTE — ED Provider Notes (Signed)
 4:07 PM Assumed care of patient from off-going team. For more details, please see note from same day.  In brief, this is a 73 y.o. male w/ h/o COPD on home O2. Reports elevated HR at home. Not more SOB, no CP, on baseline O2. Has small troponin leak 80 w/ no prior for comparison.  Plan/Dispo at time of sign-out & ED Course since sign-out: [ ]  CT PE, delta trop  BP (!) 145/103   Pulse (!) 104   Temp 98.3 F (36.8 C) (Oral)   Resp 20   Ht 5' 6 (1.676 m)   Wt 98 kg   SpO2 100%   BMI 34.86 kg/m    ED Course:   Clinical Course as of 05/23/24 2141  Thu May 23, 2024  1633 Troponin T High Sensitivity(!): 70 Trop 80-->70 [HN]  1634 Pro Brain Natriuretic Peptide(!): 2,068.0 Elevated pro-BNP [HN]  1734 CT Angio Chest PE W and/or Wo Contrast 1. No segmental or larger pulmonary embolus. No acute intrathoracic abnormality 2. 4 cm fusiform dilatation of the ascending thoracic aorta. Continue annual imaging followup by CTA or MRA. This recommendation follows 2010 ACCF/AHA/AATS/ACR/ASA/SCA/SCAI/SIR/STS/SVM Guidelines for the Diagnosis and Management of Patients with Thoracic Aortic Disease. Circulation. 2010; 121: Z733-z630. Aortic aneurysm NOS (ICD10-I71.9) 3. Coarse aortic valve, coronary artery and aortic arch atherosclerosis. Aortic Atherosclerosis (ICD10-I70.0). 4. 4 mm RIGHT lower lobe solid pulmonary nodule. Patient undergoes routine CT lung screening. For lung cancer screening, adhere to Lung-RADS guidelines. Reference: Radiology. 2017; 284(1):228-43.   [HN]  1735 Consulted to cards for trop leak [HN]  1743 D/w Dr. Wendel w/ cardiology who stated trop is essentially stable and doesn't indicate ACS.  [HN]  1930 Gave small bolus fluid challenge with no improvement or change in HR. he is persistently tachycardic between 100-120 bpm despite not having any albuterol  or nebulizers in the last 7 hours.  Will admit to the hospital. [HN]    Clinical Course User Index [HN] Franklyn Sid SAILOR, MD    Dispo: Admitted to medicine ------------------------------- Sid Franklyn, MD Emergency Medicine  This note was created using dictation software, which may contain spelling or grammatical errors.   Franklyn Sid SAILOR, MD 05/23/24 2141

## 2024-05-23 NOTE — ED Triage Notes (Signed)
 Pt reports with tachycardia since yesterday. Pt is also having labored breathing, he reports that this happens sometimes. Pt is on 2.5 L [place] at baseline for his COPD.

## 2024-05-23 NOTE — H&P (Signed)
 History and Physical    Sean [name] FMW:[id number] DOB: 05-14-1951 DOA: 05/23/2024  PCP: [facility]  Patient coming from: Home  I have personally briefly reviewed patient's old medical records in [facility]  Chief Complaint: Tachycardia  HPI: Sean [name] is a 73 y.o. male with medical history significant for COPD, chronic respiratory failure with hypoxia on 2.5 L O2 [place] at baseline, HTN, HLD, PVD s/p femorofemoral bypass, MGUS, iron  deficiency anemia, OSA on CPAP who presented to the ED for evaluation of tachycardia.  Patient reports when he was checking his blood pressure and oxygen levels yesterday he saw that his heart rate was up to 148.  He says sometimes he does see his heart rate goes as fast but usually resolves on its own.  He continue to check his heart rate through last night and this morning and saw that his pulse continue to fluctuate mostly between 120-140s.  He says usually his heart rate is 85-90.  Patient otherwise states he has been asymptomatic.  He has not had any chest pain or feeling palpitations.  He denies lightheadedness, dizziness.  He reports chronic shortness of breath related to COPD which is unchanged from his baseline.  He denies any cough.  He reports chronic swelling in his lower extremities which is also unchanged.  He denies nausea, vomiting, diarrhea, abdominal pain, fevers, chills, diaphoresis.  Patient is a former smoker and quit in 2014.  He does report drinking about 2 beers daily.  Denies history of withdrawal.  He denies any illicit drug use.  He denies any recent change in his medications.  He reports taking his metoprolol  this morning.  He denies recent use of his rescue albuterol  inhaler.  ED Course  Labs/Imaging on admission: I have personally reviewed following labs and imaging studies.  Initial vitals showed BP 128/80, pulse 115, RR 22, temp 98.3 F, SpO2 98% on 2.5 L O2 [place].  Labs showed proBNP 2068, troponin T 80 > 70, WBC 5.3,  hemoglobin 12.4, platelets 204, sodium 133, potassium 4.6, bicarb 23, BUN 17, creatinine 0.97, serum glucose 146.  CTA chest negative for segmental or larger pulmonary embolus.  No acute intrathoracic abnormality.  4 cm fusiform dilatation of the ascending thoracic aorta noted.  Coarse aortic valve, coronary artery and aortic arch atherosclerosis seen.  4 mm right lower lobe solid pulmonary nodule again seen.  Patient was given 500 cc normal saline.  EDP discussed with on-call cardiology who felt troponin elevation was clinically insignificant.  The hospitalist service was consulted for admission.  Review of Systems: All systems reviewed and are negative except as documented in history of present illness above.   Past Medical History:  Diagnosis Date   Alcohol abuse    Alcoholic hepatitis without ascites (HCC)    Arthritis    Cataracts, bilateral    Colon polyps    COPD (chronic obstructive pulmonary disease) (HCC)    Fatty liver    GERD (gastroesophageal reflux disease)    Hyperlipidemia    Hypertension    Pneumonia    PTSD (post-traumatic stress disorder)    Skin carcinoma    Sleep apnea    Umbilical hernia    Vitamin D deficiency     Past Surgical History:  Procedure Laterality Date   ABDOMINAL AORTOGRAM W/LOWER EXTREMITY Bilateral 09/28/2020   Procedure: ABDOMINAL AORTOGRAM W/LOWER EXTREMITY;  Surgeon: Sheree Penne Bruckner, MD;  Location: [facility] INVASIVE CV LAB;  Service: Cardiovascular;  Laterality: Bilateral;   APPLICATION OF WOUND  VAC Right 01/12/2021   Procedure: APPLICATION OF WOUND VAC;  Surgeon: Sheree Penne Bruckner, MD;  Location: [facility] OR;  Service: Vascular;  Laterality: Right;   BACK SURGERY     ENDARTERECTOMY FEMORAL Left 11/04/2020   Procedure: LEFT COMMON FEMORAL ARTERY ENDARTERECTOMY;  Surgeon: Sheree Penne Bruckner, MD;  Location: [facility] OR;  Service: Vascular;  Laterality: Left;   EYE SURGERY     FEMORAL-FEMORAL BYPASS GRAFT Bilateral 11/04/2020    Procedure: RIGHT TO LEFT FEMORAL-FEMORAL BYPASS;  Surgeon: Sheree Penne Bruckner, MD;  Location: [facility] OR;  Service: Vascular;  Laterality: Bilateral;   HERNIA REPAIR     INCISION AND DRAINAGE OF WOUND Right 01/06/2021   Procedure: IRRIGATION AND DEBRIDEMENT GROIN;  Surgeon: Serene Gaile ORN, MD;  Location: [facility] OR;  Service: Vascular;  Laterality: Right;   INCISION AND DRAINAGE OF WOUND Right 01/12/2021   Procedure: GROIN [facility] OUT AND MUSCLE FLAP;  Surgeon: Sheree Penne Bruckner, MD;  Location: [facility] OR;  Service: Vascular;  Laterality: Right;   PATCH ANGIOPLASTY Bilateral 11/04/2020   Procedure: PATCH ANGIOPLASTY LEFT AND RIGHT COMMON FEMORAL ARTERY;  Surgeon: Sheree Penne Bruckner, MD;  Location: [facility] OR;  Service: Vascular;  Laterality: Bilateral;   ULTRASOUND GUIDANCE FOR VASCULAR ACCESS Bilateral 11/04/2020   Procedure: ULTRASOUND GUIDANCE FOR VASCULAR ACCESS;  Surgeon: Sheree Penne Bruckner, MD;  Location: [facility] OR;  Service: Vascular;  Laterality: Bilateral;   WISDOM TOOTH EXTRACTION      Social History: Patient is a former smoker, quit in 2014.  He reports drinking 2 beers daily.  He denies illicit drug use.  Allergies  Allergen Reactions   Amlodipine Swelling and Other (See Comments)    Edema of lower extremities    Family History  Problem Relation Age of Onset   COPD Mother    Diabetes Mother    Hypertension Father    COPD Father    Heart disease Father    Arthritis Sister    Colon cancer Neg Hx    Stomach cancer Neg Hx    Esophageal cancer Neg Hx      Prior to Admission medications   Medication Sig Start Date End Date Taking? Authorizing Provider  albuterol  (PROVENTIL  HFA;VENTOLIN  HFA) 108 (90 BASE) MCG/ACT inhaler Inhale 2 puffs into the lungs every 6 (six) hours as needed for wheezing or shortness of breath.    [provider]  albuterol  (PROVENTIL ) (2.5 MG/3ML) 0.083% nebulizer solution Take 2.5 mg by nebulization every 6 (six) hours as needed for  wheezing or shortness of breath.    [provider]  amLODipine (NORVASC) 2.5 MG tablet Take 2.5 mg by mouth daily.    [provider]  amoxicillin  (AMOXIL ) 500 MG capsule Take 2 capsules (1,000 mg total) by mouth 2 (two) times daily. 04/05/21   Dea Shiner, MD  aspirin  EC 81 MG tablet Take 81 mg by mouth at bedtime. Swallow whole.    [provider]  benazepril  (LOTENSIN ) 10 MG tablet Take 1 tablet (10 mg total) by mouth at bedtime. 10/05/20   Hobart Powell BRAVO, MD  Carboxymethylcellulose Sodium (THERATEARS PF OP) Place 2 drops into both eyes 3 (three) times daily.    [provider]  cilostazol  (PLETAL ) 100 MG tablet Take 1 tablet (100 mg total) by mouth 2 (two) times daily. 12/26/23   Sheree Penne Bruckner, MD  ferrous sulfate 325 (65 FE) MG tablet Take 1 tablet by mouth daily. 12/02/20   [provider]  folic acid  (FOLVITE ) 1 MG tablet Take  1 mg by mouth daily. 06/22/20   [provider]  furosemide  (LASIX ) 20 MG tablet Take 20 mg by mouth daily. 02/12/24   [provider]  Magnesium  Oxide 420 MG TABS Take 420 mg by mouth every other day. 06/22/20   [provider]  MENTHOL-METHYL SALICYLATE EX Apply 1 application topically 2 (two) times daily as needed (for knee pain).    [provider]  metoprolol  (TOPROL -XL) 200 MG 24 hr tablet Take 200 mg by mouth daily.    [provider]  montelukast (SINGULAIR) 10 MG tablet Take 10 mg by mouth at bedtime.    [provider]  Multiple Vitamin (MULTIVITAMIN) tablet Take 1 tablet by mouth every other day. Vitamins for eyes    [provider]  NON FORMULARY     [provider]  pantoprazole  (PROTONIX ) 40 MG tablet Take 40 mg by mouth 2 (two) times daily.    [provider]  rosuvastatin  (CRESTOR ) 20 MG tablet Take 10 mg by mouth daily. 06/22/20   [provider]  thiamine  100 MG tablet Take 100 mg by mouth daily.     [provider]  Tiotropium Bromide -Olodaterol (STIOLTO RESPIMAT) 2.5-2.5 MCG/ACT AERS Inhale 2 each into the lungs daily.    [provider]    Physical Exam: Vitals:   05/23/24 1915 05/23/24 1930 05/23/24 1945 05/23/24 2000  BP: (!) 152/92 (!) 166/90 (!) 141/99 (!) 157/96  Pulse: (!) 110 (!) 107 (!) 107 (!) 106  Resp: (!) 21 15 14  (!) 24  Temp:      TempSrc:      SpO2: 99% 99% 96% 99%  Weight:      Height:       Constitutional: Seen up in bed, NAD, calm, comfortable Eyes: EOMI, lids and conjunctivae normal ENMT: Mucous membranes are moist. Posterior pharynx clear of any exudate or lesions.Normal dentition.  Neck: normal, supple, no masses. Respiratory: Distant breath sounds, clear to auscultation..  Slightly increased respiratory effort while on 2.5 L O2 [place]. No accessory muscle use.  Cardiovascular: Tachycardic, no murmurs / rubs / gallops.  Trace extremity edema. 2+ pedal pulses. Abdomen: no tenderness, no masses palpated. Musculoskeletal: no clubbing / cyanosis. No joint deformity upper and lower extremities. Good ROM, no contractures. Normal muscle tone.  Skin: no rashes, lesions, ulcers. No induration Neurologic: Sensation intact. Strength 5/5 in all 4.  Psychiatric: Normal judgment and insight. Alert and oriented x 3. Normal mood.   EKG: Personally reviewed. Sinus tachycardia, rate 116, PVC, LAFB, no acute ischemic changes.  Rate is faster when compared to previous from January 2023.  Assessment/Plan Principal Problem:   Sinus tachycardia Active Problems:   COPD (chronic obstructive pulmonary disease) (HCC)   PAD (peripheral artery disease)   Essential hypertension   OSA on CPAP   Chronic respiratory failure with hypoxia (HCC)   MGUS (monoclonal gammopathy of unknown significance)   Iron  deficiency anemia   Sean [name] is a 73 y.o. male with medical history significant for COPD, chronic respiratory failure with hypoxia on 2.5 L O2 [place] at baseline,  HTN, HLD, PVD s/p femorofemoral bypass, MGUS, iron  deficiency anemia, OSA on CPAP who is admitted for evaluation of tachycardia with elevated troponin.  Assessment and Plan: Sinus tachycardia/elevated troponin and proBNP: Patient presenting with reportedly new tachycardia, up to 148 at home.  HR mostly in 100-110 range at time of admission.  Rhythm appears to be sinus with occasional PVCs.  Troponin mildly elevated 80 > 70.  proBNP also elevated >2000.  Patient otherwise asymptomatic.  CTA chest negative for PE or pulmonary edema.  Coarse aortic valve and coronary artery atherosclerosis noted.  TTE 10/2020 showed EF 55-60%, mild LVH, G1DD. - Keep on telemetry tonight - Obtain echocardiogram - Check magnesium , TSH - Continue Toprol -XL 200 mg daily - Continue home aspirin  and rosuvastatin   COPD Chronic hypoxic respiratory failure on 2.5 L O2 via [place] at baseline: Chronic and appears stable.  Continue Stiolto Respimat, Singulair, albuterol  as needed.  Continue supplemental O2, maintain SpO2 88-92%.  Hypertension: Continue home Toprol -XL, amlodipine, benazepril .  PVD s/p femorofemoral bypass: Continue aspirin , Pletal , rosuvastatin .  Iron  deficiency anemia: Hemoglobin stable at 12.4.  Macrocytosis noted.  Check B12 and folate levels.  Continue iron  supplement.  MGUS: Follows with oncology Dr. Sherrod for active surveillance.  Hyperlipidemia: Continue rosuvastatin .  OSA: Continue CPAP nightly.  4 cm ascending thoracic aortic aneurysm: Again noted on CT imaging.  This has been followed on patient's annual lung cancer screening CT.  Right lower lobe pulmonary nodule: Again noted and follows with pulmonology for routine CT lung screening.   DVT prophylaxis: enoxaparin (LOVENOX) injection 40 mg Start: 05/23/24 2100 Code Status: Full code, discussed with patient on admission Family Communication: Discussed with patient, he has discussed with family Disposition Plan: From home and likely  discharge home pending clinical progress Consults called: None Severity of Illness: The appropriate patient status for this patient is OBSERVATION. Observation status is judged to be reasonable and necessary in order to provide the required intensity of service to ensure the patient's safety. The patient's presenting symptoms, physical exam findings, and initial radiographic and laboratory data in the context of their medical condition is felt to place them at decreased risk for further clinical deterioration. Furthermore, it is anticipated that the patient will be medically stable for discharge from the hospital within 2 midnights of admission.   Jorie Blanch MD [facility]  If 7PM-7AM, please contact night-coverage [email or web address]  05/23/2024, 9:09 PM

## 2024-05-23 NOTE — Hospital Course (Signed)
 Sean [name] is a 73 y.o. male with medical history significant for COPD, chronic respiratory failure with hypoxia on 2.5 L O2 [place] at baseline, HTN, HLD, PVD s/p femorofemoral bypass, MGUS, iron  deficiency anemia, OSA on CPAP who is admitted for evaluation of tachycardia with elevated troponin.

## 2024-05-24 ENCOUNTER — Observation Stay (HOSPITAL_COMMUNITY)

## 2024-05-24 DIAGNOSIS — R7989 Other specified abnormal findings of blood chemistry: Secondary | ICD-10-CM | POA: Diagnosis not present

## 2024-05-24 DIAGNOSIS — R079 Chest pain, unspecified: Secondary | ICD-10-CM

## 2024-05-24 DIAGNOSIS — R000 Tachycardia, unspecified: Secondary | ICD-10-CM | POA: Diagnosis not present

## 2024-05-24 LAB — FOLATE: Folate: >20 ng/mL (ref >5.9)

## 2024-05-24 LAB — ECHOCARDIOGRAM COMPLETE
AR max vel: 2.33 cm2
AV Area VTI: 2.28 cm2
AV Area mean vel: 2.25 cm2
AV Mean grad: 13 mmHg
AV Peak grad: 22.5 mmHg
Ao pk vel: 2.37 m/s
Area-P 1/2: 6.54 cm2
Calc EF: 66.1 %
Height: 66 in
MV VTI: 3.25 cm2
S' Lateral: 2.8 cm
Single Plane A2C EF: 64.9 %
Single Plane A4C EF: 68.5 %
Weight: 3456 [oz_av]

## 2024-05-24 LAB — CBC
HCT: 34.2 % — ABNORMAL LOW (ref 39.0–52.0)
Hemoglobin: 10.8 g/dL — ABNORMAL LOW (ref 13.0–17.0)
MCH: 33.6 pg (ref 26.0–34.0)
MCHC: 31.6 g/dL (ref 30.0–36.0)
MCV: 106.5 fL — ABNORMAL HIGH (ref 80.0–100.0)
Platelets: 179 K/uL (ref 150–400)
RBC: 3.21 MIL/uL — ABNORMAL LOW (ref 4.22–5.81)
RDW: 14.7 % (ref 11.5–15.5)
WBC: 4.6 K/uL (ref 4.0–10.5)
nRBC: 0 % (ref 0.0–0.2)

## 2024-05-24 LAB — MAGNESIUM: Magnesium: 1.9 mg/dL (ref 1.7–2.4)

## 2024-05-24 LAB — BASIC METABOLIC PANEL WITH GFR
Anion gap: 12 (ref 5–15)
BUN: 13 mg/dL (ref 8–23)
CO2: 24 mmol/L (ref 22–32)
Calcium: 9.1 mg/dL (ref 8.9–10.3)
Chloride: 101 mmol/L (ref 98–111)
Creatinine, Ser: 0.71 mg/dL (ref 0.61–1.24)
GFR, Estimated: >60 mL/min (ref >60)
Glucose, Bld: 84 mg/dL (ref 70–99)
Potassium: 4 mmol/L (ref 3.5–5.1)
Sodium: 136 mmol/L (ref 135–145)

## 2024-05-24 LAB — VITAMIN B12: Vitamin B-12: 652 pg/mL (ref 180–914)

## 2024-05-24 LAB — TSH: TSH: 4 u[IU]/mL (ref 0.350–4.500)

## 2024-05-24 MED ORDER — AMLODIPINE BESYLATE 10 MG PO TABS: 5.0000 mg | ORAL_TABLET | Freq: Every day | ORAL | Status: DC

## 2024-05-24 MED ORDER — BENAZEPRIL HCL 20 MG PO TABS
20.0000 mg | ORAL_TABLET | Freq: Every day | ORAL | Status: DC
  Administered 2024-05-24 – 2024-05-25 (×2): 20 mg via ORAL
  Filled 2024-05-24 (×2): qty 1

## 2024-05-24 MED ORDER — AMLODIPINE BESYLATE 5 MG PO TABS
2.5000 mg | ORAL_TABLET | Freq: Every day | ORAL | Status: DC
  Administered 2024-05-25 – 2024-05-26 (×2): 2.5 mg via ORAL
  Filled 2024-05-24 (×2): qty 1

## 2024-05-24 NOTE — Progress Notes (Signed)
   05/24/24 2252  BiPAP/CPAP/SIPAP  BiPAP/CPAP/SIPAP Pt Type Adult  BiPAP/CPAP/SIPAP Resmed  Mask Type Full face mask  Dentures removed? Not applicable  Mask Size Large  Respiratory Rate 20 breaths/min  Flow Rate 2 lpm  Patient Home Machine No  Patient Home Mask No  Patient Home Tubing No  Auto Titrate Yes  Minimum cmH2O 5 cmH2O  Maximum cmH2O 20 cmH2O  Nasal massage performed Yes  CPAP/SIPAP surface wiped down Yes  Device Plugged into RED Power Outlet Yes  BiPAP/CPAP /SiPAP Vitals  Pulse Rate (!) 107  Resp 20  SpO2 96 %  Bilateral Breath Sounds Clear;Diminished

## 2024-05-24 NOTE — Progress Notes (Signed)
   05/24/24 0023  BiPAP/CPAP/SIPAP  $ Face Mask Large  Yes  BiPAP/CPAP/SIPAP Pt Type Adult  BiPAP/CPAP/SIPAP Resmed  Mask Type Full face mask  Dentures removed? Not applicable  Mask Size Large  Flow Rate 3 lpm  Heater Temperature  (humidifier filled to full line with sterile water)  Patient Home Machine No  Patient Home Mask No  Patient Home Tubing No  Auto Titrate Yes  Minimum cmH2O 5 cmH2O  Maximum cmH2O 20 cmH2O  Device Plugged into RED Power Outlet Yes

## 2024-05-24 NOTE — Progress Notes (Signed)
 [facility] PROGRESS NOTE    Progress Note  Sean [name]  FMW:[id number] DOB: [birth year] DOA: 05/23/2024 PCP: [facility]     Brief Narrative:   Sean [name] is an 73 y.o. male past medical history significant for COPD, chronic respiratory failure with hypoxia on 2 L of oxygen, essential hypertension PVD status post femoral to femoral bypass, MGUS iron  deficiency anemia for persistent tachycardia he was asymptomatic denied any chest pain or palpitations, lightheadedness  Assessment/Plan:   Sinus tachycardia CT angio of the chest was negative for PE or pulmonary edema, but it did show 4 cm fusiform dilation of the ascending aorta Elevated troponins and BNP, cardiac biomarkers are flat and improving Last 2D echo in 2022 showed an EF of 55% grade 1 diastolic dysfunction. TSH 4, hemoglobin of 11 Echocardiogram pending Continue aspirin  statin and metoprolol . He remained asymptomatic, denies any emotional stress or anxiety appears to be euvolemic denies any pain, he denies any over-the-counter medication or beta agonist like caffeine or decongestions, his hemoglobin appears to be stable.  COPD (chronic obstructive pulmonary disease) (HCC) Still requiring 2 L of oxygen. Continue inhalers.  Essential hypertension: Continue metoprolol  and will repeat in benazepril .  History of PAD (peripheral artery disease) With a history of femoral-femoral bypass continue aspirin  Pletal  and statins.  Iron  deficiency anemia: Hemoglobin appears to be relatively stable. B12 and folate are unremarkable.  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  Hyperlipidemia: Continue statins.  4 cm ascending aortic aneurysm: Will need a follow-up imaging in 6 to 12 months.  4 mm right lower lobe solitary pulmonary nodule. Follow-up with pulmonary as an outpatient.  DVT prophylaxis: lovenox Family Communication:none Status is: Observation The patient remains OBS appropriate and will d/c  before 2 midnights.    Code Status:     Code Status Orders  (From admission, onward)           Start     Ordered   05/23/24 2053  Full code  Continuous       Question:  By:  Answer:  Consent: discussion documented in EHR   05/23/24 2055           Code Status History     Date Active Date Inactive Code Status Order ID Comments User Context   01/04/2021 1622 01/15/2021 2340 Full Code [id number]  Gerome Maurilio CHRISTELLA DEVONNA Inpatient   01/04/2021 1407 01/04/2021 1621 Full Code [id number]  Charlyne Reed, PA-C Outpatient   11/04/2020 1554 11/07/2020 1643 Full Code [id number]  Gerome Maurilio CHRISTELLA DEVONNA Inpatient   09/28/2020 1200 09/28/2020 2128 Full Code [id number]  Sheree Penne Bruckner, MD Inpatient      Advance Directive Documentation    Flowsheet Row Most Recent Value  Type of Advance Directive Healthcare Power of Attorney  Pre-existing out of facility DNR order (yellow form or pink MOST form) --  MOST Form in Place? --      IV Access:   Peripheral IV   Procedures and diagnostic studies:   CT Angio Chest PE W and/or Wo Contrast Result Date: 05/23/2024 CLINICAL DATA:  Pulmonary embolism (PE) suspected, high prob Pt reports with tachycardia since yesterday. Pt is also having labored breathing, he reports that this happens sometimes. Pt is on 2.5 L [place] at baseline for his COPD. EXAM: CT ANGIOGRAPHY CHEST WITH CONTRAST TECHNIQUE: Multidetector CT imaging of the chest was performed using the standard protocol during bolus administration of intravenous contrast. Multiplanar CT image reconstructions and MIPs were obtained to evaluate the vascular  anatomy. RADIATION DOSE REDUCTION: This exam was performed according to the departmental dose-optimization program which includes automated exposure control, adjustment of the mA and/or kV according to patient size and/or use of iterative reconstruction technique. CONTRAST:  75mL OMNIPAQUE  IOHEXOL  350 MG/ML SOLN COMPARISON:  CT chest, 02/02/2024.   Chest XR, concurrent. FINDINGS: Cardiovascular: Satisfactory opacification of the pulmonary arteries to the segmental level. No evidence of pulmonary embolism. Normal heart size. No pericardial effusion. Coarse aortic valve and coronary artery calcified atherosclerosis. 4 cm fusiform dilatation of the ascending aortic aorta. Arch atherosclerosis Mediastinum/Nodes: No enlarged mediastinal, hilar, or axillary lymph nodes. Thyroid  gland, trachea, and esophagus demonstrate no significant findings. Lungs/Pleura: 4 mm RIGHT lower lobe pulmonary nodule. Lungs are otherwise clear without focal consolidation or mass. No pleural effusion or pneumothorax. Upper Abdomen: No acute abnormality. Musculoskeletal: No acute chest wall abnormality. No acute osseous findings. Review of the MIP images confirms the above findings. IMPRESSION: 1. No segmental or larger pulmonary embolus. No acute intrathoracic abnormality 2. 4 cm fusiform dilatation of the ascending thoracic aorta. Continue annual imaging followup by CTA or MRA. This recommendation follows 2010 ACCF/AHA/AATS/ACR/ASA/SCA/SCAI/SIR/STS/SVM Guidelines for the Diagnosis and Management of Patients with Thoracic Aortic Disease. Circulation. 2010; 121: Z733-z630. Aortic aneurysm NOS (ICD10-I71.9) 3. Coarse aortic valve, coronary artery and aortic arch atherosclerosis. Aortic Atherosclerosis (ICD10-I70.0). 4. 4 mm RIGHT lower lobe solid pulmonary nodule. Patient undergoes routine CT lung screening. For lung cancer screening, adhere to Lung-RADS guidelines. Reference: Radiology. 2017; 284(1):228-43. Electronically Signed   By: Thom Hall M.D.   On: 05/23/2024 17:17   DG Chest 2 View Result Date: 05/23/2024 EXAM: 2 VIEW(S) XRAY OF THE CHEST 05/23/2024 12:57:00 PM COMPARISON: None available. CLINICAL HISTORY: chest pain. Per chart - Pt reports with tachycardia since yesterday. Pt is also having labored breathing, he reports that this happens sometimes. Pt is on 2.5 L [place] at  baseline for his COPD. FINDINGS: LUNGS AND PLEURA: Chronic atelectasis at the left lung base. Mild elevation of left hemidiaphragm. Upper lungs are clear. No pulmonary edema. No pleural effusion. No pneumothorax. No acute findings. HEART AND MEDIASTINUM: No acute abnormality of the cardiac and mediastinal silhouettes. BONES AND SOFT TISSUES: No acute osseous abnormality. IMPRESSION: 1. No acute abnormalities. 2. Atelectasis of the left lung base. Electronically signed by: Norleen Boxer MD 05/23/2024 01:48 PM EDT RP Workstation: [id number]     Medical Consultants:   None.   Subjective:    Adien Kimmel he continues to be asymptomatic  Objective:    Vitals:   05/23/24 2220 05/24/24 0027 05/24/24 0237 05/24/24 0501  BP:  (!) 145/92 (!) 158/92 (!) 141/92  Pulse: (!) 116 (!) 127 (!) 117 (!) 117  Resp:  20 15 17   Temp:  97.9 F (36.6 C) 98.8 F (37.1 C) 98.7 F (37.1 C)  TempSrc:  Oral Axillary Oral  SpO2: 94% 96% 100% 97%  Weight:      Height:       SpO2: 97 % O2 Flow Rate (L/min): 3 L/min  No intake or output data in the 24 hours ending 05/24/24 0704 Filed Weights   05/23/24 1237  Weight: 98 kg    Exam: General exam: In no acute distress. Respiratory system: Good air movement and clear to auscultation. Cardiovascular system: S1 & S2 heard, RRR. No JVD.  Gastrointestinal system: Abdomen is nondistended, soft and nontender.  Extremities: No pedal edema. Skin: No rashes, lesions or ulcers Psychiatry: Judgement and insight appear normal. Mood & affect appropriate.  Data Reviewed:    Labs: Basic Metabolic Panel: Recent Labs  Lab 05/23/24 1239 05/23/24 2316 05/24/24 0354  NA 133*  --  136  K 4.6  --  4.0  CL 97*  --  101  CO2 23  --  24  GLUCOSE 146*  --  84  BUN 17  --  13  CREATININE 0.97  --  0.71  CALCIUM  9.8  --  9.1  MG  --  1.9  --    GFR Estimated Creatinine Clearance: 92.8 mL/min (by C-G formula based on SCr of 0.71 mg/dL). Liver Function  Tests: No results for input(s): AST, ALT, ALKPHOS, BILITOT, PROT, ALBUMIN  in the last 168 hours. No results for input(s): LIPASE, AMYLASE in the last 168 hours. No results for input(s): AMMONIA in the last 168 hours. Coagulation profile No results for input(s): INR, PROTIME in the last 168 hours. COVID-19 Labs  No results for input(s): DDIMER, FERRITIN, LDH, CRP in the last 72 hours.  Lab Results  Component Value Date   SARSCOV2NAA NEGATIVE 08/18/2021   SARSCOV2NAA NEGATIVE 01/06/2021   SARSCOV2NAA NEGATIVE 10/31/2020   SARSCOV2NAA NEGATIVE 09/26/2020    CBC: Recent Labs  Lab 05/23/24 1239 05/24/24 0354  WBC 5.3 4.6  HGB 12.4* 10.8*  HCT 38.6* 34.2*  MCV 105.8* 106.5*  PLT 204 179   Cardiac Enzymes: No results for input(s): CKTOTAL, CKMB, CKMBINDEX, TROPONINI in the last 168 hours. BNP (last 3 results) Recent Labs    05/23/24 1546  PROBNP 2,068.0*   CBG: No results for input(s): GLUCAP in the last 168 hours. D-Dimer: No results for input(s): DDIMER in the last 72 hours. Hgb A1c: No results for input(s): HGBA1C in the last 72 hours. Lipid Profile: No results for input(s): CHOL, HDL, LDLCALC, TRIG, CHOLHDL, LDLDIRECT in the last 72 hours. Thyroid  function studies: Recent Labs    05/23/24 2316  TSH 4.000   Anemia work up: Recent Labs    05/23/24 2316  VITAMINB12 652  FOLATE >20.0   Sepsis Labs: Recent Labs  Lab 05/23/24 1239 05/24/24 0354  WBC 5.3 4.6   Microbiology No results found for this or any previous visit (from the past 240 hours).   Medications:    amLODipine  2.5 mg Oral Daily   arformoterol  15 mcg Nebulization BID   And   umeclidinium bromide   1 puff Inhalation Daily   aspirin  EC  81 mg Oral QHS   benazepril   10 mg Oral QHS   cilostazol   100 mg Oral BID   enoxaparin (LOVENOX) injection  40 mg Subcutaneous Q24H   ferrous sulfate  325 mg Oral Daily   metoprolol   200 mg Oral  Daily   montelukast  10 mg Oral QHS   rosuvastatin   10 mg Oral Daily   sodium chloride  flush  3 mL Intravenous Q12H   Continuous Infusions:    LOS: 0 days   Erle Odell Castor  [facility]  05/24/2024, 7:04 AM

## 2024-05-25 DIAGNOSIS — R000 Tachycardia, unspecified: Secondary | ICD-10-CM | POA: Diagnosis not present

## 2024-05-25 MED ORDER — FUROSEMIDE 20 MG PO TABS
20.0000 mg | ORAL_TABLET | Freq: Every day | ORAL | Status: DC
  Administered 2024-05-25 – 2024-05-26 (×2): 20 mg via ORAL
  Filled 2024-05-25 (×2): qty 1

## 2024-05-25 MED ORDER — DILTIAZEM HCL 30 MG PO TABS
30.0000 mg | ORAL_TABLET | Freq: Four times a day (QID) | ORAL | Status: DC
  Administered 2024-05-25 – 2024-05-26 (×4): 30 mg via ORAL
  Filled 2024-05-25 (×4): qty 1

## 2024-05-25 NOTE — Progress Notes (Signed)
   05/25/24 2315  BiPAP/CPAP/SIPAP  BiPAP/CPAP/SIPAP Pt Type Adult  BiPAP/CPAP/SIPAP Resmed  Mask Type Full face mask  Dentures removed? Not applicable  Mask Size Large  Respiratory Rate 20 breaths/min  Flow Rate 3 lpm  Patient Home Machine No  Patient Home Mask No  Patient Home Tubing No  Auto Titrate Yes  Minimum cmH2O 5 cmH2O  Maximum cmH2O 20 cmH2O  Device Plugged into RED Power Outlet Yes  BiPAP/CPAP /SiPAP Vitals  Pulse Rate 82  Resp 20  SpO2 98 %  Bilateral Breath Sounds Clear

## 2024-05-25 NOTE — Evaluation (Signed)
 Physical Therapy Evaluation Patient Details Name: Sean [name] MRN: [id number] DOB: June 10, 1951 Today's Date: 05/25/2024  History of Present Illness  73 y.o. male for persistent tachycardia. CT angio of the chest was negative for PE or pulmonary edema, but it did show 4 cm fusiform dilation of the ascending aorta  Elevated troponins and BNP, cardiac biomarkers are flat and improving     past medical history significant for COPD, chronic respiratory failure with hypoxia on 2 L of oxygen, essential hypertension PVD status post femoral to femoral bypass, MGUS iron  deficiency anemia  Clinical Impression  Patient evaluated by Physical Therapy with no further acute PT needs identified. All education has been completed and the patient has no further questions.  Pt reports independence at his baseline, uses 2L O2 when exerting himself.  Pt able to amb 180' with CGA to supervision with SpO2=92-95% on RA. No LOB, pt does endorse 1 recent mechanical fall after tripping on his shoe, no other falls in last 6 mos. Pt reports mobility to be at his baseline. No f/u PT  indicated, pt has DME at home;  pt will benefit from mobility team if he remains in acute setting.  See below for any follow-up Physical Therapy or equipment needs. PT is signing off. Thank you for this referral.         If plan is discharge home, recommend the following: Help with stairs or ramp for entrance   Can travel by private vehicle        Equipment Recommendations None recommended by PT  Recommendations for Other Services       Functional Status Assessment Patient has had a recent decline in their functional status and demonstrates the ability to make significant improvements in function in a reasonable and predictable amount of time.     Precautions / Restrictions Precautions Precautions: Fall Precaution/Restrictions Comments: recent mechanical fall face plant per pt d/t tripping on his shoe Restrictions Weight Bearing  Restrictions Per Provider Order: No      Mobility  Bed Mobility               General bed mobility comments: pt standing in front of recliner on fall mat on arrival    Transfers Overall transfer level: Modified independent Equipment used: None               General transfer comment: no physical assist    Ambulation/Gait Ambulation/Gait assistance: Supervision, Contact guard assist Gait Distance (Feet): 180 Feet Assistive device: None Gait Pattern/deviations: Step-through pattern, Wide base of support, Trendelenburg       General Gait Details: CGA to fade to S. no overt LOB. SpO2 = 92-95% on RA ,pt denies significant  dyspnea  Stairs            Wheelchair Mobility     Tilt Bed    Modified Rankin (Stroke Patients Only)       Balance Overall balance assessment: Needs assistance, History of Falls     Sitting balance - Comments: NT   Standing balance support: No upper extremity supported, During functional activity Standing balance-Leahy Scale: Fair Standing balance comment: NT to moderate challenges;             High level balance activites: Backward walking, Head turns, Side stepping, Turns High Level Balance Comments: no LOB with above, close S for safety             Pertinent Vitals/Pain Pain Assessment Pain Assessment: No/denies pain    Home Living  Family/patient expects to be discharged to:: Private residence Living Arrangements: Alone   Type of Home: House Home Access: Stairs to enter Entrance Stairs-Rails: Doctor, general practice of Steps: 3   Home Layout: One level Home Equipment: Agricultural consultant (2 wheels);Cane - single point Additional Comments: home O2; pt reports he is fairly active, 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)    Prior Function Prior Level of Function : Independent/Modified Independent;Driving                     Extremity/Trunk Assessment   Upper Extremity  Assessment Upper Extremity Assessment: Overall WFL for tasks assessed    Lower Extremity Assessment Lower Extremity Assessment: Overall WFL for tasks assessed       Communication        Cognition Arousal: Alert Behavior During Therapy: WFL for tasks assessed/performed   PT - Cognitive impairments: No apparent impairments                         Following commands: Intact       Cueing Cueing Techniques: Verbal cues     General Comments      Exercises     Assessment/Plan    PT Assessment Patient does not need any further PT services  PT Problem List         PT Treatment Interventions      PT Goals (Current goals can be found in the Care Plan section)  Acute Rehab PT Goals PT Goal Formulation: All assessment and education complete, DC therapy    Frequency       Co-evaluation               AM-PAC PT 6 Clicks Mobility  Outcome Measure Help needed turning from your back to your side while in a flat bed without using bedrails?: None Help needed moving from lying on your back to sitting on the side of a flat bed without using bedrails?: None Help needed moving to and from a bed to a chair (including a wheelchair)?: None Help needed standing up from a chair using your arms (e.g., wheelchair or bedside chair)?: None Help needed to walk in hospital room?: None Help needed climbing 3-5 steps with a railing? : A Little 6 Click Score: 23    End of Session   Activity Tolerance: Patient tolerated treatment well Patient left: in chair   PT Visit Diagnosis: Other abnormalities of gait and mobility (R26.89)    Time: 8380-8369 PT Time Calculation (min) (ACUTE ONLY): 11 min   Charges:   PT Evaluation $PT Eval Low Complexity: 1 Low   PT General Charges $$ ACUTE PT VISIT: 1 Visit         Joseff Luckman, PT  Acute Rehab Dept ([facility]) [phone]  05/25/2024   [facility] 05/25/2024, 4:55 PM

## 2024-05-25 NOTE — Progress Notes (Signed)
 [facility] PROGRESS NOTE    Progress Note  Sean [name]  FMW:[id number] DOB: 01-06-1951 DOA: 05/23/2024 PCP: [facility]     Brief Narrative:   Sean [name] is an 73 y.o. male past medical history significant for COPD, chronic respiratory failure with hypoxia on 2 L of oxygen, essential hypertension PVD status post femoral to femoral bypass, MGUS iron  deficiency anemia for persistent tachycardia he was asymptomatic denied any chest pain or palpitations, lightheadedness  Assessment/Plan:   Sinus tachycardia CT angio of the chest was negative for PE or pulmonary edema, but it did show 4 cm fusiform dilation of the ascending aorta Elevated troponins and BNP, cardiac biomarkers are flat and improving Last 2D echo in 2022 showed an EF of 60% no regional wall motion abnormality.  There is moderate mitral stenosis mild aortic stenosis. Continue aspirin  statin and metoprolol . He remained asymptomatic, denies any emotional stress or anxiety appears to be euvolemic denies any pain, he denies any over-the-counter medication or beta agonist like caffeine or decongestions, his hemoglobin appears to be stable.  COPD (chronic obstructive pulmonary disease) (HCC) Still requiring 2 L of oxygen. Continue inhalers.  Essential hypertension: Continue metoprolol  and will repeat in benazepril .  History of PAD (peripheral artery disease) With a history of femoral-femoral bypass continue aspirin  Pletal  and statins.  Iron  deficiency anemia: Hemoglobin appears to be relatively stable. B12 and folate are unremarkable.  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  Hyperlipidemia: Continue statins.  4 cm ascending aortic aneurysm: Will need a follow-up imaging in 6 to 12 months.  4 mm right lower lobe solitary pulmonary nodule. Follow-up with pulmonary as an outpatient.  DVT prophylaxis: lovenox Family Communication:none Status is: Observation The patient remains OBS appropriate  and will d/c before 2 midnights.    Code Status:     Code Status Orders  (From admission, onward)           Start     Ordered   05/23/24 2053  Full code  Continuous       Question:  By:  Answer:  Consent: discussion documented in EHR   05/23/24 2055           Code Status History     Date Active Date Inactive Code Status Order ID Comments User Context   01/04/2021 1622 01/15/2021 2340 Full Code [id number]  Gerome Maurilio CHRISTELLA DEVONNA Inpatient   01/04/2021 1407 01/04/2021 1621 Full Code [id number]  Charlyne Reed, PA-C Outpatient   11/04/2020 1554 11/07/2020 1643 Full Code [id number]  Gerome Maurilio CHRISTELLA DEVONNA Inpatient   09/28/2020 1200 09/28/2020 2128 Full Code [id number]  Sheree Penne Bruckner, MD Inpatient      Advance Directive Documentation    Flowsheet Row Most Recent Value  Type of Advance Directive Healthcare Power of Attorney  Pre-existing out of facility DNR order (yellow form or pink MOST form) --  MOST Form in Place? --      IV Access:   Peripheral IV   Procedures and diagnostic studies:   ECHOCARDIOGRAM COMPLETE Result Date: 05/24/2024    ECHOCARDIOGRAM REPORT   [name] Date of Exam: 05/24/2024 Medical Rec #:  [id number]   Height:       66.0 in Accession #:    [id number]  Weight:       216.0 lb Date of Birth:  1951-06-06    BSA:          2.066 m Patient Age:    80 years  BP:           143/93 mmHg Patient Gender: M           HR:           114 bpm. Exam Location:  Inpatient Procedure: 2D Echo, Cardiac Doppler and Color Doppler (Both Spectral and Color            Flow Doppler were utilized during procedure). Indications:    R07.9* Chest pain, unspecified. Elevated troponin.  History:        Patient has prior history of Echocardiogram examinations, most                 recent 10/27/2020. Risk Factors:Hypertension, Sleep Apnea and                 Current Smoker.  Sonographer:    Ellouise Mose RDCS Referring Phys: [id number] VISHAL R PATEL  Sonographer  Comments: Technically difficult study due to poor echo windows. Image acquisition challenging due to patient body habitus and Image acquisition challenging due to COPD. IMPRESSIONS  1. Left ventricular ejection fraction, by estimation, is 60 to 65%. Left ventricular ejection fraction by 2D MOD biplane is 66.1 %. Left ventricular ejection fraction by PLAX is 66 %. The left ventricle has normal function. The left ventricle has no regional wall motion abnormalities. There is mild left ventricular hypertrophy. Indeterminate diastolic filling due to E-A fusion.  2. Right ventricular systolic function is normal. The right ventricular size is normal. There is normal pulmonary artery systolic pressure. The estimated right ventricular systolic pressure is 23.4 mmHg.  3. The mitral valve is degenerative. Trivial mitral valve regurgitation. Moderate mitral stenosis. Moderate mitral annular calcification.  4. The aortic valve is calcified. There is moderate calcification of the aortic valve. There is moderate thickening of the aortic valve. Aortic valve regurgitation is trivial. Mild aortic valve stenosis.  5. Aortic dilatation noted. There is mild dilatation of the ascending aorta, measuring 41 mm.  6. The inferior vena cava is normal in size with greater than 50% respiratory variability, suggesting right atrial pressure of 3 mmHg. Comparison(s): No significant change from prior study. FINDINGS  Left Ventricle: Left ventricular ejection fraction, by estimation, is 60 to 65%. Left ventricular ejection fraction by PLAX is 66 %. Left ventricular ejection fraction by 2D MOD biplane is 66.1 %. The left ventricle has normal function. The left ventricle has no regional wall motion abnormalities. The left ventricular internal cavity size was normal in size. There is mild left ventricular hypertrophy. Indeterminate diastolic filling due to E-A fusion. Right Ventricle: The right ventricular size is normal. No increase in right  ventricular wall thickness. Right ventricular systolic function is normal. There is normal pulmonary artery systolic pressure. The tricuspid regurgitant velocity is 2.26 m/s, and  with an assumed right atrial pressure of 3 mmHg, the estimated right ventricular systolic pressure is 23.4 mmHg. Left Atrium: Left atrial size was normal in size. Right Atrium: Right atrial size was normal in size. Pericardium: There is no evidence of pericardial effusion. Presence of epicardial fat layer. Mitral Valve: The mitral valve is degenerative in appearance. Moderate mitral annular calcification. Trivial mitral valve regurgitation. Moderate mitral valve stenosis. MV peak gradient, 15.8 mmHg. The mean mitral valve gradient is 7.0 mmHg. Tricuspid Valve: The tricuspid valve is normal in structure. Tricuspid valve regurgitation is trivial. No evidence of tricuspid stenosis. Aortic Valve: The aortic valve is calcified. There is moderate calcification of the aortic valve. There is moderate thickening of  the aortic valve. Aortic valve regurgitation is trivial. Mild aortic stenosis is present. Aortic valve mean gradient measures 13.0 mmHg. Aortic valve peak gradient measures 22.5 mmHg. Aortic valve area, by VTI measures 2.28 cm. Pulmonic Valve: The pulmonic valve was not well visualized. Pulmonic valve regurgitation is not visualized. No evidence of pulmonic stenosis. Aorta: Aortic dilatation noted. There is mild dilatation of the ascending aorta, measuring 41 mm. Venous: The inferior vena cava is normal in size with greater than 50% respiratory variability, suggesting right atrial pressure of 3 mmHg. IAS/Shunts: The atrial septum is grossly normal.  LEFT VENTRICLE PLAX 2D                        Biplane EF (MOD) LV EF:         Left            LV Biplane EF:   Left                ventricular                      ventricular                ejection                         ejection                fraction by                      fraction by                 PLAX is 66                       2D MOD                %.                               biplane is LVIDd:         4.40 cm                          66.1 %. LVIDs:         2.80 cm LV PW:         1.20 cm         Diastology LV IVS:        1.50 cm         LV e' medial:    3.70 cm/s LVOT diam:     2.40 cm         LV E/e' medial:  47.3 LV SV:         76              LV e' lateral:   6.53 cm/s LV SV Index:   37              LV E/e' lateral: 26.8 LVOT Area:     4.52 cm  LV Volumes (MOD) LV vol d, MOD    54.7 ml A2C: LV vol d, MOD    61.5 ml A4C: LV vol s, MOD    19.2 ml A2C: LV vol s, MOD    19.4 ml A4C: LV SV MOD A2C:   35.5 ml  LV SV MOD A4C:   61.5 ml LV SV MOD BP:    39.1 ml RIGHT VENTRICLE             IVC RV S prime:     15.90 cm/s  IVC diam: 1.80 cm TAPSE (M-mode): 2.3 cm LEFT ATRIUM             Index        RIGHT ATRIUM           Index LA diam:        2.80 cm 1.36 cm/m   RA Area:     15.70 cm LA Vol (A2C):   36.3 ml 17.57 ml/m  RA Volume:   33.50 ml  16.21 ml/m LA Vol (A4C):   24.2 ml 11.71 ml/m LA Biplane Vol: 32.1 ml 15.54 ml/m  AORTIC VALVE AV Area (Vmax):    2.33 cm AV Area (Vmean):   2.25 cm AV Area (VTI):     2.28 cm AV Vmax:           237.00 cm/s AV Vmean:          159.000 cm/s AV VTI:            0.335 m AV Peak Grad:      22.5 mmHg AV Mean Grad:      13.0 mmHg LVOT Vmax:         122.00 cm/s LVOT Vmean:        79.200 cm/s LVOT VTI:          0.169 m LVOT/AV VTI ratio: 0.50  AORTA Ao Root diam: 4.40 cm Ao Asc diam:  4.17 cm MITRAL VALVE                TRICUSPID VALVE MV Area (PHT): 6.54 cm     TR Peak grad:   20.4 mmHg MV Area VTI:   3.25 cm     TR Vmax:        226.00 cm/s MV Peak grad:  15.8 mmHg MV Mean grad:  7.0 mmHg     SHUNTS MV Vmax:       1.99 m/s     Systemic VTI:  0.17 m MV Vmean:      121.0 cm/s   Systemic Diam: 2.40 cm MV Decel Time: 116 msec MV E velocity: 175.00 cm/s Shelda Bruckner MD Electronically signed by Shelda Bruckner MD Signature Date/Time:  05/24/2024/5:36:17 PM    Final    CT Angio Chest PE W and/or Wo Contrast Result Date: 05/23/2024 CLINICAL DATA:  Pulmonary embolism (PE) suspected, high prob Pt reports with tachycardia since yesterday. Pt is also having labored breathing, he reports that this happens sometimes. Pt is on 2.5 L [place] at baseline for his COPD. EXAM: CT ANGIOGRAPHY CHEST WITH CONTRAST TECHNIQUE: Multidetector CT imaging of the chest was performed using the standard protocol during bolus administration of intravenous contrast. Multiplanar CT image reconstructions and MIPs were obtained to evaluate the vascular anatomy. RADIATION DOSE REDUCTION: This exam was performed according to the departmental dose-optimization program which includes automated exposure control, adjustment of the mA and/or kV according to patient size and/or use of iterative reconstruction technique. CONTRAST:  75mL OMNIPAQUE  IOHEXOL  350 MG/ML SOLN COMPARISON:  CT chest, 02/02/2024.  Chest XR, concurrent. FINDINGS: Cardiovascular: Satisfactory opacification of the pulmonary arteries to the segmental level. No evidence of pulmonary embolism. Normal heart size. No pericardial effusion. Coarse aortic valve and coronary artery calcified atherosclerosis. 4 cm fusiform dilatation of the ascending  aortic aorta. Arch atherosclerosis Mediastinum/Nodes: No enlarged mediastinal, hilar, or axillary lymph nodes. Thyroid  gland, trachea, and esophagus demonstrate no significant findings. Lungs/Pleura: 4 mm RIGHT lower lobe pulmonary nodule. Lungs are otherwise clear without focal consolidation or mass. No pleural effusion or pneumothorax. Upper Abdomen: No acute abnormality. Musculoskeletal: No acute chest wall abnormality. No acute osseous findings. Review of the MIP images confirms the above findings. IMPRESSION: 1. No segmental or larger pulmonary embolus. No acute intrathoracic abnormality 2. 4 cm fusiform dilatation of the ascending thoracic aorta. Continue annual imaging  followup by CTA or MRA. This recommendation follows 2010 ACCF/AHA/AATS/ACR/ASA/SCA/SCAI/SIR/STS/SVM Guidelines for the Diagnosis and Management of Patients with Thoracic Aortic Disease. Circulation. 2010; 121: Z733-z630. Aortic aneurysm NOS (ICD10-I71.9) 3. Coarse aortic valve, coronary artery and aortic arch atherosclerosis. Aortic Atherosclerosis (ICD10-I70.0). 4. 4 mm RIGHT lower lobe solid pulmonary nodule. Patient undergoes routine CT lung screening. For lung cancer screening, adhere to Lung-RADS guidelines. Reference: Radiology. 2017; 284(1):228-43. Electronically Signed   By: Thom Hall M.D.   On: 05/23/2024 17:17   DG Chest 2 View Result Date: 05/23/2024 EXAM: 2 VIEW(S) XRAY OF THE CHEST 05/23/2024 12:57:00 PM COMPARISON: None available. CLINICAL HISTORY: chest pain. Per chart - Pt reports with tachycardia since yesterday. Pt is also having labored breathing, he reports that this happens sometimes. Pt is on 2.5 L  at baseline for his COPD. FINDINGS: LUNGS AND PLEURA: Chronic atelectasis at the left lung base. Mild elevation of left hemidiaphragm. Upper lungs are clear. No pulmonary edema. No pleural effusion. No pneumothorax. No acute findings. HEART AND MEDIASTINUM: No acute abnormality of the cardiac and mediastinal silhouettes. BONES AND SOFT TISSUES: No acute osseous abnormality. IMPRESSION: 1. No acute abnormalities. 2. Atelectasis of the left lung base. Electronically signed by: Norleen Boxer MD 05/23/2024 01:48 PM EDT RP Workstation: [id number]     Medical Consultants:   None.   Subjective:    Sean [name] continues to be asymptomatic.  Objective:    Vitals:   05/24/24 2023 05/24/24 2252 05/25/24 0458 05/25/24 0753  BP:   127/84   Pulse:  (!) 107 99   Resp:  20 18   Temp:   97.8 F (36.6 C)   TempSrc:   Oral   SpO2: 93% 96% 97% 97%  Weight:      Height:       SpO2: 97 % O2 Flow Rate (L/min): 3 L/min   Intake/Output Summary (Last 24 hours) at 05/25/2024 0930 Last  data filed at 05/24/2024 2300 Gross per 24 hour  Intake 360 ml  Output --  Net 360 ml   Filed Weights   05/23/24 1237  Weight: 98 kg    Exam: General exam: In no acute distress. Respiratory system: Good air movement and clear to auscultation. Cardiovascular system: S1 & S2 heard, RRR. No JVD. Gastrointestinal system: Abdomen is nondistended, soft and nontender.  Extremities: No pedal edema. Skin: No rashes, lesions or ulcers Psychiatry: Judgement and insight appear normal. Mood & affect appropriate.  Data Reviewed:    Labs: Basic Metabolic Panel: Recent Labs  Lab 05/23/24 1239 05/23/24 2316 05/24/24 0354  NA 133*  --  136  K 4.6  --  4.0  CL 97*  --  101  CO2 23  --  24  GLUCOSE 146*  --  84  BUN 17  --  13  CREATININE 0.97  --  0.71  CALCIUM  9.8  --  9.1  MG  --  1.9  --  GFR Estimated Creatinine Clearance: 92.8 mL/min (by C-G formula based on SCr of 0.71 mg/dL). Liver Function Tests: No results for input(s): AST, ALT, ALKPHOS, BILITOT, PROT, ALBUMIN  in the last 168 hours. No results for input(s): LIPASE, AMYLASE in the last 168 hours. No results for input(s): AMMONIA in the last 168 hours. Coagulation profile No results for input(s): INR, PROTIME in the last 168 hours. COVID-19 Labs  No results for input(s): DDIMER, FERRITIN, LDH, CRP in the last 72 hours.  Lab Results  Component Value Date   SARSCOV2NAA NEGATIVE 08/18/2021   SARSCOV2NAA NEGATIVE 01/06/2021   SARSCOV2NAA NEGATIVE 10/31/2020   SARSCOV2NAA NEGATIVE 09/26/2020    CBC: Recent Labs  Lab 05/23/24 1239 05/24/24 0354  WBC 5.3 4.6  HGB 12.4* 10.8*  HCT 38.6* 34.2*  MCV 105.8* 106.5*  PLT 204 179   Cardiac Enzymes: No results for input(s): CKTOTAL, CKMB, CKMBINDEX, TROPONINI in the last 168 hours. BNP (last 3 results) Recent Labs    05/23/24 1546  PROBNP 2,068.0*   CBG: No results for input(s): GLUCAP in the last 168 hours. D-Dimer: No  results for input(s): DDIMER in the last 72 hours. Hgb A1c: No results for input(s): HGBA1C in the last 72 hours. Lipid Profile: No results for input(s): CHOL, HDL, LDLCALC, TRIG, CHOLHDL, LDLDIRECT in the last 72 hours. Thyroid  function studies: Recent Labs    05/23/24 2316  TSH 4.000   Anemia work up: Recent Labs    05/23/24 2316  VITAMINB12 652  FOLATE >20.0   Sepsis Labs: Recent Labs  Lab 05/23/24 1239 05/24/24 0354  WBC 5.3 4.6   Microbiology No results found for this or any previous visit (from the past 240 hours).   Medications:    amLODipine  2.5 mg Oral Daily   arformoterol  15 mcg Nebulization BID   And   umeclidinium bromide   1 puff Inhalation Daily   aspirin  EC  81 mg Oral QHS   benazepril   20 mg Oral QHS   cilostazol   100 mg Oral BID   enoxaparin (LOVENOX) injection  40 mg Subcutaneous Q24H   ferrous sulfate  325 mg Oral Daily   metoprolol   200 mg Oral Daily   montelukast  10 mg Oral QHS   rosuvastatin   10 mg Oral Daily   sodium chloride  flush  3 mL Intravenous Q12H   Continuous Infusions:    LOS: 0 days   Sean [name]  [facility]  05/25/2024, 9:30 AM

## 2024-05-25 NOTE — Plan of Care (Signed)
 Pt took a lot of time throughout the day to stand and move at bedside as far as the O2 tube would allow.  He stated PT was planning to come by today, but they have not been seen as of yet.

## 2024-05-26 ENCOUNTER — Other Ambulatory Visit (HOSPITAL_COMMUNITY): Payer: Self-pay

## 2024-05-26 DIAGNOSIS — R000 Tachycardia, unspecified: Secondary | ICD-10-CM | POA: Diagnosis not present

## 2024-05-26 MED ORDER — STIOLTO RESPIMAT 2.5-2.5 MCG/ACT IN AERS: 2.0000 | INHALATION_SPRAY | Freq: Every day | RESPIRATORY_TRACT | Status: AC

## 2024-05-26 MED ORDER — DILTIAZEM HCL 30 MG PO TABS: 30.0000 mg | ORAL_TABLET | Freq: Four times a day (QID) | ORAL | Status: DC

## 2024-05-26 MED ORDER — INFLUENZA VAC SPLIT HIGH-DOSE 0.5 ML IM SUSY: 0.5000 mL | PREFILLED_SYRINGE | INTRAMUSCULAR | Status: DC

## 2024-05-26 MED ORDER — INFLUENZA VAC SPLIT HIGH-DOSE 0.5 ML IM SUSY
0.5000 mL | PREFILLED_SYRINGE | Freq: Once | INTRAMUSCULAR | Status: AC
  Administered 2024-05-26: 0.5 mL via INTRAMUSCULAR
  Filled 2024-05-26: qty 0.5

## 2024-05-26 MED ORDER — DILTIAZEM HCL 60 MG PO TABS: 60.0000 mg | ORAL_TABLET | Freq: Four times a day (QID) | ORAL | Status: DC

## 2024-05-26 MED ORDER — DILTIAZEM HCL ER COATED BEADS 120 MG PO CP24
120.0000 mg | ORAL_CAPSULE | Freq: Every day | ORAL | 11 refills | Status: AC
Start: 2024-05-26 — End: ?
  Filled 2024-05-26: qty 30, 30d supply, fill #0

## 2024-05-26 NOTE — Plan of Care (Signed)
  Problem: Education: Goal: Knowledge of General Education information will improve Description: Including pain rating scale, medication(s)/side effects and non-pharmacologic comfort measures 05/26/2024 1054 by Rosanne Elspeth HERO, RN Outcome: Adequate for Discharge 05/26/2024 [phone] by Rosanne Elspeth HERO, RN Outcome: Progressing   Problem: Health Behavior/Discharge Planning: Goal: Ability to manage health-related needs will improve 05/26/2024 1054 by Rosanne Elspeth HERO, RN Outcome: Adequate for Discharge 05/26/2024 0839 by Rosanne Elspeth HERO, RN Outcome: Progressing   Problem: Clinical Measurements: Goal: Ability to maintain clinical measurements within normal limits will improve 05/26/2024 1054 by Rosanne Elspeth HERO, RN Outcome: Adequate for Discharge 05/26/2024 0839 by Rosanne Elspeth HERO, RN Outcome: Progressing Goal: Will remain free from infection 05/26/2024 1054 by Rosanne Elspeth HERO, RN Outcome: Adequate for Discharge 05/26/2024 [phone] by Rosanne Elspeth HERO, RN Outcome: Progressing Goal: Diagnostic test results will improve 05/26/2024 1054 by Rosanne Elspeth HERO, RN Outcome: Adequate for Discharge 05/26/2024 0839 by Rosanne Elspeth HERO, RN Outcome: Progressing Goal: Respiratory complications will improve 05/26/2024 1054 by Rosanne Elspeth HERO, RN Outcome: Adequate for Discharge 05/26/2024 0839 by Rosanne Elspeth HERO, RN Outcome: Progressing Goal: Cardiovascular complication will be avoided 05/26/2024 1054 by Rosanne Elspeth HERO, RN Outcome: Adequate for Discharge 05/26/2024 0839 by Rosanne Elspeth HERO, RN Outcome: Progressing   Problem: Activity: Goal: Risk for activity intolerance will decrease 05/26/2024 1054 by Rosanne Elspeth HERO, RN Outcome: Adequate for Discharge 05/26/2024 [phone] by Rosanne Elspeth HERO, RN Outcome: Progressing   Problem: Nutrition: Goal: Adequate nutrition will be maintained 05/26/2024 1054 by Rosanne Elspeth HERO,  RN Outcome: Adequate for Discharge 05/26/2024 0839 by Rosanne Elspeth HERO, RN Outcome: Progressing   Problem: Coping: Goal: Level of anxiety will decrease 05/26/2024 1054 by Rosanne Elspeth HERO, RN Outcome: Adequate for Discharge 05/26/2024 0839 by Rosanne Elspeth HERO, RN Outcome: Progressing   Problem: Elimination: Goal: Will not experience complications related to bowel motility 05/26/2024 1054 by Rosanne Elspeth HERO, RN Outcome: Adequate for Discharge 05/26/2024 0839 by Rosanne Elspeth HERO, RN Outcome: Progressing Goal: Will not experience complications related to urinary retention 05/26/2024 1054 by Rosanne Elspeth HERO, RN Outcome: Adequate for Discharge 05/26/2024 0839 by Rosanne Elspeth HERO, RN Outcome: Progressing   Problem: Pain Managment: Goal: General experience of comfort will improve and/or be controlled 05/26/2024 1054 by Rosanne Elspeth HERO, RN Outcome: Adequate for Discharge 05/26/2024 0839 by Rosanne Elspeth HERO, RN Outcome: Progressing   Problem: Safety: Goal: Ability to remain free from injury will improve 05/26/2024 1054 by Rosanne Elspeth HERO, RN Outcome: Adequate for Discharge 05/26/2024 0839 by Rosanne Elspeth HERO, RN Outcome: Progressing   Problem: Skin Integrity: Goal: Risk for impaired skin integrity will decrease 05/26/2024 1054 by Rosanne Elspeth HERO, RN Outcome: Adequate for Discharge 05/26/2024 0839 by Rosanne Elspeth HERO, RN Outcome: Progressing

## 2024-05-26 NOTE — Discharge Summary (Signed)
 Physician Discharge Summary  Sean [name] FMW:[id number] DOB: 01/28/1951 DOA: 05/23/2024  PCP: [facility]  Admit date: 05/23/2024 Discharge date: 05/26/2024  Admitted From: Home Disposition:  Home  Recommendations for Outpatient Follow-up:  Follow up with PCP in 1-2 weeks as an outpatient. Please obtain BMP/CBC in one week   Home Health:No Equipment/Devices:none   Discharge Condition:Stable CODE STATUS:Full Diet recommendation: Heart Healthy   Brief/Interim Summary: 73 y.o. male past medical history significant for COPD, chronic respiratory failure with hypoxia on 2 L of oxygen, essential hypertension PVD status post femoral to femoral bypass, MGUS iron  deficiency anemia for persistent tachycardia he was asymptomatic denied any chest pain or palpitations, lightheadedness   Discharge Diagnoses:  Principal Problem:   Sinus tachycardia Active Problems:   COPD (chronic obstructive pulmonary disease) (HCC)   PAD (peripheral artery disease)   Essential hypertension   OSA on CPAP   Chronic respiratory failure with hypoxia (HCC)   MGUS (monoclonal gammopathy of unknown significance)   Iron  deficiency anemia  Sinus tachycardia: The patient relates he has been compliant with his metoprolol  he is asymptomatic.  But he came to the hospital because his heart rate was elevated. CT angio of the chest was negative for PE pulmonary edema dissection he did had a fully formed dilated aortic aneurysm was unchanged from previous. 2D echo showed an EF of 60% no regional wall motion abnormality mild mitral and aortic stenosis no regurg. He will continue on aspirin  and Plavix. 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 He denies any over-the-counter medication in his drinks or beta agonist any excessive caffeine or decongestant. His hemoglobin is stable. He was started on  oral diltiazem and this controlled his heart rate. He will go on metoprolol  max dose 200 mg and diltiazem 120 daily this controlled his pain.  Chronic respiratory failure with hypoxia on 2 L of oxygen at home/COPD: Which has been to his medication continue inhalers.  Central hypertension: Continue current regimen no changes made. Continue metoprolol  benazepril  Norvasc.  History of PAD: With history of femoral-femoral bypass continue aspirin  and blood fall. Continue statins.  Iron  deficiency anemia Hemoglobin appears to be stable B12 and folate unremarkable.  History of MGUS: Follow-up oncology as an outpatient.  Hyperlipidemia: Sign continue statins.  4 cm ascending aortic aneurysm: Unchanged from previous by CT follow-up with CT in 6 to 12 months.  Incidental 4 mm right lower lobe solitary nodule: Follow-up with pulmonary as an outpatient will need repeat follow-up as an outpatient he has a history of smoking.  Discharge Instructions  Discharge Instructions     Diet - low sodium heart healthy   Complete by: As directed    Increase activity slowly   Complete by: As directed       Allergies as of 05/26/2024   No Known Allergies      Medication List     STOP taking these medications    folic acid  1 MG tablet Commonly known as: FOLVITE    MENTHOL-METHYL SALICYLATE EX   thiamine  100 MG tablet Commonly known as: VITAMIN B1       TAKE these medications    acetaminophen  325 MG tablet Commonly known as: TYLENOL  Take 650 mg by mouth every 8 (eight) hours as needed. Take 650 mg by mouth in the morning and with supper   albuterol  108 (90 Base) MCG/ACT inhaler Commonly known as: VENTOLIN  HFA Inhale  2 puffs into the lungs every 6 (six) hours as needed for wheezing or shortness of breath. What changed: Another medication with the same name was removed. Continue taking this medication, and follow the directions you see here.   amLODipine 5 MG tablet Commonly  known as: NORVASC Take 5 mg by mouth daily.   aspirin  EC 81 MG tablet Take 81 mg by mouth daily. Swallow whole.   benazepril  10 MG tablet Commonly known as: LOTENSIN  Take 1 tablet (10 mg total) by mouth at bedtime.   cilostazol  100 MG tablet Commonly known as: PLETAL  Take 1 tablet (100 mg total) by mouth 2 (two) times daily.   diltiazem 120 MG 24 hr capsule Commonly known as: Cardizem CD Take 1 capsule (120 mg total) by mouth daily.   ferrous sulfate 325 (65 FE) MG tablet Take 1 tablet by mouth every Monday, Wednesday, and Friday.   furosemide  20 MG tablet Commonly known as: LASIX  Take 20 mg by mouth daily.   hydrALAZINE  10 MG tablet Commonly known as: APRESOLINE  Take 10 mg by mouth in the morning and at bedtime.   Magnesium  Oxide 420 MG Tabs Take 840 mg by mouth daily.   metoprolol  200 MG 24 hr tablet Commonly known as: TOPROL -XL Take 200 mg by mouth daily.   multivitamin tablet Take 1 tablet by mouth every other day. Vitamins for eyes   pantoprazole  40 MG tablet Commonly known as: PROTONIX  Take 40 mg by mouth 2 (two) times daily.   potassium chloride  10 MEQ tablet Commonly known as: KLOR-CON  Take 10 mEq by mouth daily.   rosuvastatin  20 MG tablet Commonly known as: CRESTOR  Take 10 mg by mouth in the morning and at bedtime.   Stiolto Respimat 2.5-2.5 MCG/ACT Aers Generic drug: Tiotropium Bromide -Olodaterol Inhale 2 each into the lungs daily.   triamcinolone cream 0.1 % Commonly known as: KENALOG Apply 1 Application topically 2 (two) times daily as needed (rash/flea bites).   urea 20 % cream Commonly known as: CARMOL Apply 1 application  topically 2 (two) times daily.        No Known Allergies  Consultations: None   Procedures/Studies: ECHOCARDIOGRAM COMPLETE Result Date: 05/24/2024    ECHOCARDIOGRAM REPORT   Patient Name:   TANAY MISURACA Date of Exam: 05/24/2024 Medical Rec #:  [id number]   Height:       66.0 in Accession #:    [id number]   Weight:       216.0 lb Date of Birth:  03/10/1951    BSA:          2.066 m Patient Age:    73 years    BP:           143/93 mmHg Patient Gender: M           HR:           114 bpm. Exam Location:  Inpatient Procedure: 2D Echo, Cardiac Doppler and Color Doppler (Both Spectral and Color            Flow Doppler were utilized during procedure). Indications:    R07.9* Chest pain, unspecified. Elevated troponin.  History:        Patient has prior history of Echocardiogram examinations, most                 recent 10/27/2020. Risk Factors:Hypertension, Sleep Apnea and                 Current Smoker.  Sonographer:    Ellouise Mose  RDCS Referring Phys: [id number] JORIE SAUNDERS PATEL  Sonographer Comments: Technically difficult study due to poor echo windows. Image acquisition challenging due to patient body habitus and Image acquisition challenging due to COPD. IMPRESSIONS  1. Left ventricular ejection fraction, by estimation, is 60 to 65%. Left ventricular ejection fraction by 2D MOD biplane is 66.1 %. Left ventricular ejection fraction by PLAX is 66 %. The left ventricle has normal function. The left ventricle has no regional wall motion abnormalities. There is mild left ventricular hypertrophy. Indeterminate diastolic filling due to E-A fusion.  2. Right ventricular systolic function is normal. The right ventricular size is normal. There is normal pulmonary artery systolic pressure. The estimated right ventricular systolic pressure is 23.4 mmHg.  3. The mitral valve is degenerative. Trivial mitral valve regurgitation. Moderate mitral stenosis. Moderate mitral annular calcification.  4. The aortic valve is calcified. There is moderate calcification of the aortic valve. There is moderate thickening of the aortic valve. Aortic valve regurgitation is trivial. Mild aortic valve stenosis.  5. Aortic dilatation noted. There is mild dilatation of the ascending aorta, measuring 41 mm.  6. The inferior vena cava is normal in size with greater  than 50% respiratory variability, suggesting right atrial pressure of 3 mmHg. Comparison(s): No significant change from prior study. FINDINGS  Left Ventricle: Left ventricular ejection fraction, by estimation, is 60 to 65%. Left ventricular ejection fraction by PLAX is 66 %. Left ventricular ejection fraction by 2D MOD biplane is 66.1 %. The left ventricle has normal function. The left ventricle has no regional wall motion abnormalities. The left ventricular internal cavity size was normal in size. There is mild left ventricular hypertrophy. Indeterminate diastolic filling due to E-A fusion. Right Ventricle: The right ventricular size is normal. No increase in right ventricular wall thickness. Right ventricular systolic function is normal. There is normal pulmonary artery systolic pressure. The tricuspid regurgitant velocity is 2.26 m/s, and  with an assumed right atrial pressure of 3 mmHg, the estimated right ventricular systolic pressure is 23.4 mmHg. Left Atrium: Left atrial size was normal in size. Right Atrium: Right atrial size was normal in size. Pericardium: There is no evidence of pericardial effusion. Presence of epicardial fat layer. Mitral Valve: The mitral valve is degenerative in appearance. Moderate mitral annular calcification. Trivial mitral valve regurgitation. Moderate mitral valve stenosis. MV peak gradient, 15.8 mmHg. The mean mitral valve gradient is 7.0 mmHg. Tricuspid Valve: The tricuspid valve is normal in structure. Tricuspid valve regurgitation is trivial. No evidence of tricuspid stenosis. Aortic Valve: The aortic valve is calcified. There is moderate calcification of the aortic valve. There is moderate thickening of the aortic valve. Aortic valve regurgitation is trivial. Mild aortic stenosis is present. Aortic valve mean gradient measures 13.0 mmHg. Aortic valve peak gradient measures 22.5 mmHg. Aortic valve area, by VTI measures 2.28 cm. Pulmonic Valve: The pulmonic valve was not well  visualized. Pulmonic valve regurgitation is not visualized. No evidence of pulmonic stenosis. Aorta: Aortic dilatation noted. There is mild dilatation of the ascending aorta, measuring 41 mm. Venous: The inferior vena cava is normal in size with greater than 50% respiratory variability, suggesting right atrial pressure of 3 mmHg. IAS/Shunts: The atrial septum is grossly normal.  LEFT VENTRICLE PLAX 2D                        Biplane EF (MOD) LV EF:         Left  LV Biplane EF:   Left                ventricular                      ventricular                ejection                         ejection                fraction by                      fraction by                PLAX is 66                       2D MOD                %.                               biplane is LVIDd:         4.40 cm                          66.1 %. LVIDs:         2.80 cm LV PW:         1.20 cm         Diastology LV IVS:        1.50 cm         LV e' medial:    3.70 cm/s LVOT diam:     2.40 cm         LV E/e' medial:  47.3 LV SV:         76              LV e' lateral:   6.53 cm/s LV SV Index:   37              LV E/e' lateral: 26.8 LVOT Area:     4.52 cm  LV Volumes (MOD) LV vol d, MOD    54.7 ml A2C: LV vol d, MOD    61.5 ml A4C: LV vol s, MOD    19.2 ml A2C: LV vol s, MOD    19.4 ml A4C: LV SV MOD A2C:   35.5 ml LV SV MOD A4C:   61.5 ml LV SV MOD BP:    39.1 ml RIGHT VENTRICLE             IVC RV S prime:     15.90 cm/s  IVC diam: 1.80 cm TAPSE (M-mode): 2.3 cm LEFT ATRIUM             Index        RIGHT ATRIUM           Index LA diam:        2.80 cm 1.36 cm/m   RA Area:     15.70 cm LA Vol (A2C):   36.3 ml 17.57 ml/m  RA Volume:   33.50 ml  16.21 ml/m LA Vol (A4C):   24.2 ml 11.71 ml/m LA Biplane Vol: 32.1 ml 15.54 ml/m  AORTIC VALVE AV Area (Vmax):    2.33  cm AV Area (Vmean):   2.25 cm AV Area (VTI):     2.28 cm AV Vmax:           237.00 cm/s AV Vmean:          159.000 cm/s AV VTI:            0.335 m AV Peak Grad:       22.5 mmHg AV Mean Grad:      13.0 mmHg LVOT Vmax:         122.00 cm/s LVOT Vmean:        79.200 cm/s LVOT VTI:          0.169 m LVOT/AV VTI ratio: 0.50  AORTA Ao Root diam: 4.40 cm Ao Asc diam:  4.17 cm MITRAL VALVE                TRICUSPID VALVE MV Area (PHT): 6.54 cm     TR Peak grad:   20.4 mmHg MV Area VTI:   3.25 cm     TR Vmax:        226.00 cm/s MV Peak grad:  15.8 mmHg MV Mean grad:  7.0 mmHg     SHUNTS MV Vmax:       1.99 m/s     Systemic VTI:  0.17 m MV Vmean:      121.0 cm/s   Systemic Diam: 2.40 cm MV Decel Time: 116 msec MV E velocity: 175.00 cm/s Shelda Bruckner MD Electronically signed by Shelda Bruckner MD Signature Date/Time: 05/24/2024/5:36:17 PM    Final    CT Angio Chest PE W and/or Wo Contrast Result Date: 05/23/2024 CLINICAL DATA:  Pulmonary embolism (PE) suspected, high prob Pt reports with tachycardia since yesterday. Pt is also having labored breathing, he reports that this happens sometimes. Pt is on 2.5 L [place] at baseline for his COPD. EXAM: CT ANGIOGRAPHY CHEST WITH CONTRAST TECHNIQUE: Multidetector CT imaging of the chest was performed using the standard protocol during bolus administration of intravenous contrast. Multiplanar CT image reconstructions and MIPs were obtained to evaluate the vascular anatomy. RADIATION DOSE REDUCTION: This exam was performed according to the departmental dose-optimization program which includes automated exposure control, adjustment of the mA and/or kV according to patient size and/or use of iterative reconstruction technique. CONTRAST:  75mL OMNIPAQUE  IOHEXOL  350 MG/ML SOLN COMPARISON:  CT chest, 02/02/2024.  Chest XR, concurrent. FINDINGS: Cardiovascular: Satisfactory opacification of the pulmonary arteries to the segmental level. No evidence of pulmonary embolism. Normal heart size. No pericardial effusion. Coarse aortic valve and coronary artery calcified atherosclerosis. 4 cm fusiform dilatation of the ascending aortic aorta. Arch  atherosclerosis Mediastinum/Nodes: No enlarged mediastinal, hilar, or axillary lymph nodes. Thyroid  gland, trachea, and esophagus demonstrate no significant findings. Lungs/Pleura: 4 mm RIGHT lower lobe pulmonary nodule. Lungs are otherwise clear without focal consolidation or mass. No pleural effusion or pneumothorax. Upper Abdomen: No acute abnormality. Musculoskeletal: No acute chest wall abnormality. No acute osseous findings. Review of the MIP images confirms the above findings. IMPRESSION: 1. No segmental or larger pulmonary embolus. No acute intrathoracic abnormality 2. 4 cm fusiform dilatation of the ascending thoracic aorta. Continue annual imaging followup by CTA or MRA. This recommendation follows 2010 ACCF/AHA/AATS/ACR/ASA/SCA/SCAI/SIR/STS/SVM Guidelines for the Diagnosis and Management of Patients with Thoracic Aortic Disease. Circulation. 2010; 121: Z733-z630. Aortic aneurysm NOS (ICD10-I71.9) 3. Coarse aortic valve, coronary artery and aortic arch atherosclerosis. Aortic Atherosclerosis (ICD10-I70.0). 4. 4 mm RIGHT lower lobe solid pulmonary nodule. Patient undergoes routine CT lung screening. For  lung cancer screening, adhere to Lung-RADS guidelines. Reference: Radiology. 2017; 284(1):228-43. Electronically Signed   By: Thom Hall M.D.   On: 05/23/2024 17:17   DG Chest 2 View Result Date: 05/23/2024 EXAM: 2 VIEW(S) XRAY OF THE CHEST 05/23/2024 12:57:00 PM COMPARISON: None available. CLINICAL HISTORY: chest pain. Per chart - Pt reports with tachycardia since yesterday. Pt is also having labored breathing, he reports that this happens sometimes. Pt is on 2.5 L [place] at baseline for his COPD. FINDINGS: LUNGS AND PLEURA: Chronic atelectasis at the left lung base. Mild elevation of left hemidiaphragm. Upper lungs are clear. No pulmonary edema. No pleural effusion. No pneumothorax. No acute findings. HEART AND MEDIASTINUM: No acute abnormality of the cardiac and mediastinal silhouettes. BONES AND SOFT  TISSUES: No acute osseous abnormality. IMPRESSION: 1. No acute abnormalities. 2. Atelectasis of the left lung base. Electronically signed by: Norleen Boxer MD 05/23/2024 01:48 PM EDT RP Workstation: [id number]   (Echo, Carotid, EGD, Colonoscopy, ERCP)    Subjective: No complaints  Discharge Exam: Vitals:   05/26/24 0040 05/26/24 0458  BP:  (!) 144/86  Pulse:  92  Resp: 16 18  Temp:  98.5 F (36.9 C)  SpO2:  97%   Vitals:   05/25/24 2016 05/25/24 2315 05/26/24 0040 05/26/24 0458  BP: 128/83   (!) 144/86  Pulse: 91 82  92  Resp: 20 20 16 18   Temp: 97.6 F (36.4 C)   98.5 F (36.9 C)  TempSrc: Oral   Oral  SpO2: 96% 98%  97%  Weight:      Height:        General: Pt is alert, awake, not in acute distress Cardiovascular: RRR, S1/S2 +, no rubs, no gallops Respiratory: CTA bilaterally, no wheezing, no rhonchi Abdominal: Soft, NT, ND, bowel sounds + Extremities: no edema, no cyanosis    The results of significant diagnostics from this hospitalization (including imaging, microbiology, ancillary and laboratory) are listed below for reference.     Microbiology: No results found for this or any previous visit (from the past 240 hours).   Labs: BNP (last 3 results) No results for input(s): BNP in the last 8760 hours. Basic Metabolic Panel: Recent Labs  Lab 05/23/24 1239 05/23/24 2316 05/24/24 0354  NA 133*  --  136  K 4.6  --  4.0  CL 97*  --  101  CO2 23  --  24  GLUCOSE 146*  --  84  BUN 17  --  13  CREATININE 0.97  --  0.71  CALCIUM  9.8  --  9.1  MG  --  1.9  --    Liver Function Tests: No results for input(s): AST, ALT, ALKPHOS, BILITOT, PROT, ALBUMIN  in the last 168 hours. No results for input(s): LIPASE, AMYLASE in the last 168 hours. No results for input(s): AMMONIA in the last 168 hours. CBC: Recent Labs  Lab 05/23/24 1239 05/24/24 0354  WBC 5.3 4.6  HGB 12.4* 10.8*  HCT 38.6* 34.2*  MCV 105.8* 106.5*  PLT 204 179   Cardiac  Enzymes: No results for input(s): CKTOTAL, CKMB, CKMBINDEX, TROPONINI in the last 168 hours. BNP: Invalid input(s): POCBNP CBG: No results for input(s): GLUCAP in the last 168 hours. D-Dimer No results for input(s): DDIMER in the last 72 hours. Hgb A1c No results for input(s): HGBA1C in the last 72 hours. Lipid Profile No results for input(s): CHOL, HDL, LDLCALC, TRIG, CHOLHDL, LDLDIRECT in the last 72 hours. Thyroid  function studies Recent Labs  05/23/24 2316  TSH 4.000   Anemia work up Recent Labs    05/23/24 2316  VITAMINB12 652  FOLATE >20.0   Urinalysis    Component Value Date/Time   COLORURINE YELLOW 01/04/2021 2254   APPEARANCEUR HAZY (A) 01/04/2021 2254   LABSPEC 1.015 01/04/2021 2254   PHURINE 6.0 01/04/2021 2254   GLUCOSEU NEGATIVE 01/04/2021 2254   HGBUR SMALL (A) 01/04/2021 2254   BILIRUBINUR NEGATIVE 01/04/2021 2254   KETONESUR NEGATIVE 01/04/2021 2254   PROTEINUR NEGATIVE 01/04/2021 2254   NITRITE NEGATIVE 01/04/2021 2254   LEUKOCYTESUR LARGE (A) 01/04/2021 2254   Sepsis Labs Recent Labs  Lab 05/23/24 1239 05/24/24 0354  WBC 5.3 4.6   Microbiology No results found for this or any previous visit (from the past 240 hours).   Time coordinating discharge: Over 35 minutes  SIGNED:   Erle Odell Castor, MD  [facility] 05/26/2024, 8:39 AM Pager   If 7PM-7AM, please contact night-coverage [email or web address] Password [email or web address]

## 2024-05-26 NOTE — Plan of Care (Signed)

## 2024-06-03 ENCOUNTER — Encounter: Payer: Self-pay | Admitting: Vascular Surgery

## 2024-07-18 ENCOUNTER — Other Ambulatory Visit (HOSPITAL_COMMUNITY): Payer: Self-pay

## 2024-07-24 ENCOUNTER — Other Ambulatory Visit: Payer: Self-pay | Admitting: Vascular Surgery

## 2024-07-24 DIAGNOSIS — I739 Peripheral vascular disease, unspecified: Secondary | ICD-10-CM

## 2024-07-24 DIAGNOSIS — Z95828 Presence of other vascular implants and grafts: Secondary | ICD-10-CM

## 2024-09-02 NOTE — Progress Notes (Unsigned)
 " HISTORY AND PHYSICAL     CC:  follow up. Requesting Provider:  [facility]  HPI: This is a 74 y.o. male who is here today for follow up for PAD.  Pt has hx of bilateral CFA endarterectomy with bovine pericardial patch angioplasty and right to left femoral to femoral artery bypass with 8mm Dacron for LLE rest pain on 11/04/2020 by Dr. Sheree.  He subsequently underwent I&D of right groin, placement of abx impregnated beads on 01/03/2021 by Dr. Serene. He underwent right groin Sartorious muscle flap and vac placement on 01/12/2021 by Dr. Sheree.    Pt was last seen 07/19/2023 and at that time, he was maintained on 2.5L O2NC but did walk without limitations other than claudication.  He was not having any rest pain or tissue loss. His ABI's were decreased but stable.  He did have decreased flow through the fem fem bypass with palpable femoral pulses bilaterally.  It was noted he would be at high risk for intervention due to continuous need for oxygen.  He was scheduled for one year follow up.   The pt returns today for follow up.  He states that some days he can walk without any problems and other days his legs feel weak.  He denies any claudication, rest pain or non healing wounds.  He does have a corn on the lateral left foot.  He remains on continuous oxygen.    The pt is on a statin for cholesterol management.    The pt is on an aspirin .    Other AC:  is not The pt is on CCB, hydralazine , BB, diuretic for hypertension.  The pt is not on diabetic medication. Tobacco hx:  former   Past Medical History:  Diagnosis Date   Alcohol abuse    Alcoholic hepatitis without ascites (HCC)    Arthritis    Cataracts, bilateral    Colon polyps    COPD (chronic obstructive pulmonary disease) (HCC)    Fatty liver    GERD (gastroesophageal reflux disease)    Hyperlipidemia    Hypertension    Pneumonia    PTSD (post-traumatic stress disorder)    Skin carcinoma    Sleep apnea    Umbilical hernia     Vitamin D deficiency     Past Surgical History:  Procedure Laterality Date   ABDOMINAL AORTOGRAM W/LOWER EXTREMITY Bilateral 09/28/2020   Procedure: ABDOMINAL AORTOGRAM W/LOWER EXTREMITY;  Surgeon: Sheree Penne Bruckner, MD;  Location: [facility] INVASIVE CV LAB;  Service: Cardiovascular;  Laterality: Bilateral;   APPLICATION OF WOUND VAC Right 01/12/2021   Procedure: APPLICATION OF WOUND VAC;  Surgeon: Sheree Penne Bruckner, MD;  Location: [facility] OR;  Service: Vascular;  Laterality: Right;   BACK SURGERY     ENDARTERECTOMY FEMORAL Left 11/04/2020   Procedure: LEFT COMMON FEMORAL ARTERY ENDARTERECTOMY;  Surgeon: Sheree Penne Bruckner, MD;  Location: [facility] OR;  Service: Vascular;  Laterality: Left;   EYE SURGERY     FEMORAL-FEMORAL BYPASS GRAFT Bilateral 11/04/2020   Procedure: RIGHT TO LEFT FEMORAL-FEMORAL BYPASS;  Surgeon: Sheree Penne Bruckner, MD;  Location: [facility] OR;  Service: Vascular;  Laterality: Bilateral;   HERNIA REPAIR     INCISION AND DRAINAGE OF WOUND Right 01/06/2021   Procedure: IRRIGATION AND DEBRIDEMENT GROIN;  Surgeon: Serene Gaile ORN, MD;  Location: [facility] OR;  Service: Vascular;  Laterality: Right;   INCISION AND DRAINAGE OF WOUND Right 01/12/2021   Procedure: GROIN [facility] OUT AND MUSCLE FLAP;  Surgeon: Sheree Penne Bruckner,  MD;  Location: [facility] OR;  Service: Vascular;  Laterality: Right;   PATCH ANGIOPLASTY Bilateral 11/04/2020   Procedure: PATCH ANGIOPLASTY LEFT AND RIGHT COMMON FEMORAL ARTERY;  Surgeon: Sheree Penne Bruckner, MD;  Location: [facility] OR;  Service: Vascular;  Laterality: Bilateral;   ULTRASOUND GUIDANCE FOR VASCULAR ACCESS Bilateral 11/04/2020   Procedure: ULTRASOUND GUIDANCE FOR VASCULAR ACCESS;  Surgeon: Sheree Penne Bruckner, MD;  Location: [facility] OR;  Service: Vascular;  Laterality: Bilateral;   WISDOM TOOTH EXTRACTION      Allergies[1]  Current Outpatient Medications  Medication Sig Dispense Refill   acetaminophen  (TYLENOL ) 325 MG tablet Take 650 mg by mouth  every 8 (eight) hours as needed. Take 650 mg by mouth in the morning and with supper     albuterol  (PROVENTIL  HFA;VENTOLIN  HFA) 108 (90 BASE) MCG/ACT inhaler Inhale 2 puffs into the lungs every 6 (six) hours as needed for wheezing or shortness of breath.     amLODipine  (NORVASC ) 5 MG tablet Take 5 mg by mouth daily.     aspirin  EC 81 MG tablet Take 81 mg by mouth daily. Swallow whole.     benazepril  (LOTENSIN ) 10 MG tablet Take 1 tablet (10 mg total) by mouth at bedtime. 90 tablet 3   cilostazol  (PLETAL ) 100 MG tablet Take 1 tablet (100 mg total) by mouth 2 (two) times daily. 60 tablet 11   diltiazem  (CARDIZEM  CD) 120 MG 24 hr capsule Take 1 capsule (120 mg total) by mouth daily. 30 capsule 11   ferrous sulfate  325 (65 FE) MG tablet Take 1 tablet by mouth every Monday, Wednesday, and Friday.     furosemide  (LASIX ) 20 MG tablet Take 20 mg by mouth daily.     hydrALAZINE  (APRESOLINE ) 10 MG tablet Take 10 mg by mouth in the morning and at bedtime.     Magnesium  Oxide 420 MG TABS Take 840 mg by mouth daily.     metoprolol  (TOPROL -XL) 200 MG 24 hr tablet Take 200 mg by mouth daily.     Multiple Vitamin (MULTIVITAMIN) tablet Take 1 tablet by mouth every other day. Vitamins for eyes     pantoprazole  (PROTONIX ) 40 MG tablet Take 40 mg by mouth 2 (two) times daily.     potassium chloride  (KLOR-CON ) 10 MEQ tablet Take 10 mEq by mouth daily.     rosuvastatin  (CRESTOR ) 20 MG tablet Take 10 mg by mouth in the morning and at bedtime.     Tiotropium Bromide -Olodaterol (STIOLTO RESPIMAT ) 2.5-2.5 MCG/ACT AERS Inhale 2 each into the lungs daily.     triamcinolone cream (KENALOG) 0.1 % Apply 1 Application topically 2 (two) times daily as needed (rash/flea bites).     urea (CARMOL) 20 % cream Apply 1 application  topically 2 (two) times daily.     Current Facility-Administered Medications  Medication Dose Route Frequency Provider Last Rate Last Admin   alum & mag hydroxide-simeth (MAALOX/MYLANTA) 200-200-20 MG/5ML  suspension 15-30 mL  15-30 mL Oral Q2H PRN Baglia, Corrina, PA-C       docusate sodium  (COLACE) capsule 100 mg  100 mg Oral BID Baglia, Corrina, PA-C       guaiFENesin -dextromethorphan (ROBITUSSIN DM) 100-10 MG/5ML syrup 15 mL  15 mL Oral Q4H PRN Baglia, Corrina, PA-C       phenol (CHLORASEPTIC) mouth spray 1 spray  1 spray Mouth/Throat PRN Baglia, Corrina, PA-C       Facility-Administered Medications Ordered in Other Visits  Medication Dose Route Frequency Provider Last Rate Last Admin   acetaminophen  (TYLENOL )  tablet 325-650 mg  325-650 mg Oral Q4H PRN Baglia, Corrina, PA-C       Or   acetaminophen  (TYLENOL ) suppository 320-640 mg  320-640 mg Rectal Q4H PRN Baglia, Corrina, PA-C        Family History  Problem Relation Age of Onset   COPD Mother    Diabetes Mother    Hypertension Father    COPD Father    Heart disease Father    Arthritis Sister    Colon cancer Neg Hx    Stomach cancer Neg Hx    Esophageal cancer Neg Hx     Social History   Socioeconomic History   Marital status: Single    Spouse name: Not on file   Number of children: 0   Years of education: Not on file   Highest education level: Not on file  Occupational History   Occupation: Retired   Occupation: retired  Tobacco Use   Smoking status: Former    Current packs/day: 0.00    Average packs/day: 3.0 packs/day for 45.0 years (135.0 ttl pk-yrs)    Types: Cigarettes    Start date: 06/25/1968    Quit date: 06/25/2013    Years since quitting: 11.1    Passive exposure: Never   Smokeless tobacco: Former    Quit date: 06/25/1976  Vaping Use   Vaping status: Never Used  Substance and Sexual Activity   Alcohol use: Yes    Alcohol/week: 30.0 standard drinks of alcohol    Types: 30 Cans of beer per week    Comment: 4 per day   Drug use: No   Sexual activity: Not on file  Other Topics Concern   Not on file  Social History Narrative   Not on file   Social Drivers of Health   Tobacco Use: Medium Risk  (05/23/2024)   Patient History    Smoking Tobacco Use: Former    Smokeless Tobacco Use: Former    Passive Exposure: Never  Programmer, Applications: Not on Ship Broker Insecurity: No Food Insecurity (05/24/2024)   Epic    Worried About Programme Researcher, Broadcasting/film/video in the Last Year: Never true    [employer] of Food in the Last Year: Never true  Transportation Needs: No Transportation Needs (05/24/2024)   Epic    Lack of Transportation (Medical): No    Lack of Transportation (Non-Medical): No  Physical Activity: Not on file  Stress: Not on file  Social Connections: Socially Isolated (05/24/2024)   Social Connection and Isolation Panel    Frequency of Communication with Friends and Family: Once a week    Frequency of Social Gatherings with Friends and Family: Once a week    Attends Religious Services: Never    Database Administrator or Organizations: No    Attends Banker Meetings: Never    Marital Status: Never married  Intimate Partner Violence: Not At Risk (05/24/2024)   Epic    Fear of Current or Ex-Partner: No    Emotionally Abused: No    Physically Abused: No    Sexually Abused: No  Depression (PHQ2-9): Low Risk (05/24/2022)   Depression (PHQ2-9)    PHQ-2 Score: 0  Alcohol Screen: Not on file  Housing: Low Risk (05/24/2024)   Epic    Unable to Pay for Housing in the Last Year: No    Number of Times Moved in the Last Year: 0    Homeless in the Last Year: No  Utilities: Not At Risk (  05/24/2024)   Epic    Threatened with loss of utilities: No  Health Literacy: Not on file     REVIEW OF SYSTEMS:   [X]  denotes positive finding, [ ]  denotes negative finding Cardiac  Comments:  Chest pain or chest pressure:    Shortness of breath upon exertion:    Short of breath when lying flat:    Irregular heart rhythm:        Vascular    Pain in calf, thigh, or hip brought on by ambulation:    Pain in feet at night that wakes you up from your sleep:     Blood clot in your  veins:    Leg swelling:         Pulmonary    Oxygen at home:    Productive cough:     Wheezing:         Neurologic    Sudden weakness in arms or legs:     Sudden numbness in arms or legs:     Sudden onset of difficulty speaking or slurred speech:    Temporary loss of vision in one eye:     Problems with dizziness:         Gastrointestinal    Blood in stool:     Vomited blood:         Genitourinary    Burning when urinating:     Blood in urine:        Psychiatric    Major depression:         Hematologic    Bleeding problems:    Problems with blood clotting too easily:        Skin    Rashes or ulcers:        Constitutional    Fever or chills:      PHYSICAL EXAMINATION:  Today's Vitals   09/04/24 0936  BP: 121/75  Pulse: 80  Temp: 98.4 F (36.9 C)  SpO2: 91%  Weight: 214 lb (97.1 kg)  Height: 5' 6 (1.676 m)   Body mass index is 34.54 kg/m.   General:  WDWN in NAD; vital signs documented above Gait: Not observed HENT: WNL, normocephalic Pulmonary: normal non-labored breathing , without wheezing Cardiac: regular HR, without carotid bruits Abdomen: soft, NT Skin: without rashes Vascular Exam/Pulses: Bilateral radial pulses are palpable Monophasic flow bilateral DP/PT/pero with left > right Femoral pulses difficult to palpate due to body habitus but he does have a graft pulse present.  Extremities: without ischemic changes, without Gangrene , without cellulitis; without open wounds Musculoskeletal: no muscle wasting or atrophy  Neurologic: A&O X 3 Psychiatric:  The pt has Normal affect.   Non-Invasive Vascular Imaging:   ABI's/TBI's on 09/04/2024: Right:  0.66/0.40 - Great toe pressure: 58 Left:  0.70/0.52 - Great toe pressure: 75  Arterial duplex on 09/04/2024: Fem Fem Graft: Right to Left  +------------------+--------+--------+----------+--------+                   PSV cm/sStenosisWaveform  Comments   +------------------+--------+--------+----------+--------+  Inflow           143             monophasic          +------------------+--------+--------+----------+--------+  Prox anastomosis  112             monophasic          +------------------+--------+--------+----------+--------+  Proximal graft    59  monophasic          +------------------+--------+--------+----------+--------+  Mid graft         39              biphasic            +------------------+--------+--------+----------+--------+  Distal graft      40              biphasic            +------------------+--------+--------+----------+--------+  Distal anastomosis55              biphasic            +------------------+--------+--------+----------+--------+  Outflow          73              monophasic          +------------------+--------+--------+----------+--------+     Summary:  Right: Patent right to left femoral-femoral artery bypass graft without  evidence of significant stenosis. Low velocities in the graft could  suggest a threatened bypass.    Previous ABI's/TBI's on 07/19/2023: Right:  0.48/0.41 - Great toe pressure: 65 Left:  0.61/0.51 - Great toe pressure:  81  Previous arterial duplex on 07/19/2023: Summary:  Right: Patent right to left fem-fem bypass graft with no stenosis.  Velocities < 40 cm/s in the mid and distal portions.     ASSESSMENT/PLAN:: 74 y.o. male here for follow up for PAD with hx of bilateral CFA endarterectomy with bovine pericardial patch angioplasty and right to left femoral to femoral artery bypass with 8mm Dacron for LLE rest pain on 11/04/2020 by Dr. Sheree.  He subsequently underwent I&D of right groin, placement of abx impregnated beads on 01/03/2021 by Dr. Serene. He underwent right groin Sartorious muscle flap and vac placement on 01/12/2021 by Dr. Sheree.     -pt with monophasic doppler flow bilateral feet with left more brisk  than right. He continues to have a palpable graft pulse but also continues to have decreased velocities in the mid and distal graft that has been present over the past several ultrasounds.  At his last visit with Dr. Sheree, he felt with any intervention , the pt would be at high risk given his need for continuous oxygen.  I discussed this with pt and we will continue yearly duplex.  He understands there is a risk of the bypass shutting down.  -continue asa/statin -discussed importance of increased walking daily -pt will f/u in one year with ABI and fem fem bypass duplex.  He knows to call sooner with any new non healing wounds or rest pain.    Lucie Apt, [facility] [phone]  Clinic MD:   Pearline on call MD     [1] No Known Allergies  "

## 2024-09-04 ENCOUNTER — Ambulatory Visit (HOSPITAL_BASED_OUTPATIENT_CLINIC_OR_DEPARTMENT_OTHER)
Admission: RE | Admit: 2024-09-04 | Discharge: 2024-09-04 | Disposition: A | Discharge status: Home / Self Care | Source: Ambulatory Visit | Attending: Surgery | Admitting: Surgery

## 2024-09-04 ENCOUNTER — Ambulatory Visit (INDEPENDENT_AMBULATORY_CARE_PROVIDER_SITE_OTHER): Admitting: Physician Assistant

## 2024-09-04 ENCOUNTER — Ambulatory Visit (HOSPITAL_COMMUNITY)
Admission: RE | Admit: 2024-09-04 | Discharge: 2024-09-04 | Disposition: A | Discharge status: Home / Self Care | Source: Ambulatory Visit | Attending: Surgery | Admitting: Surgery

## 2024-09-04 VITALS — BP 121/75 | HR 80 | Temp 98.4°F | Ht 66.0 in | Wt 214.0 lb

## 2024-09-04 DIAGNOSIS — Z95828 Presence of other vascular implants and grafts: Secondary | ICD-10-CM | POA: Insufficient documentation

## 2024-09-04 DIAGNOSIS — I10 Essential (primary) hypertension: Secondary | ICD-10-CM | POA: Insufficient documentation

## 2024-09-04 DIAGNOSIS — I779 Disorder of arteries and arterioles, unspecified: Secondary | ICD-10-CM | POA: Diagnosis not present

## 2024-09-04 DIAGNOSIS — Z79899 Other long term (current) drug therapy: Secondary | ICD-10-CM | POA: Diagnosis not present

## 2024-09-04 DIAGNOSIS — I739 Peripheral vascular disease, unspecified: Secondary | ICD-10-CM

## 2024-09-04 DIAGNOSIS — E785 Hyperlipidemia, unspecified: Secondary | ICD-10-CM | POA: Insufficient documentation

## 2024-09-04 DIAGNOSIS — Z7982 Long term (current) use of aspirin: Secondary | ICD-10-CM | POA: Diagnosis not present

## 2024-09-04 DIAGNOSIS — Z87891 Personal history of nicotine dependence: Secondary | ICD-10-CM | POA: Diagnosis not present

## 2024-09-04 DIAGNOSIS — Z9981 Dependence on supplemental oxygen: Secondary | ICD-10-CM | POA: Diagnosis not present

## 2024-09-04 LAB — VAS US ABI WITH/WO TBI
Left ABI: 0.7
Right ABI: 0.66

## 2024-09-13 ENCOUNTER — Other Ambulatory Visit

## 2024-09-20 ENCOUNTER — Ambulatory Visit: Admitting: Acute Care
